# Patient Record
Sex: Male | Born: 1945
Health system: Southern US, Community
[De-identification: ages and names within clinical notes are randomized; demographics above are authoritative.]

## PROBLEM LIST (undated history)

## (undated) DIAGNOSIS — Z87442 Personal history of urinary calculi: Secondary | ICD-10-CM

## (undated) DIAGNOSIS — E785 Hyperlipidemia, unspecified: Secondary | ICD-10-CM

## (undated) DIAGNOSIS — I509 Heart failure, unspecified: Secondary | ICD-10-CM

## (undated) DIAGNOSIS — F17200 Nicotine dependence, unspecified, uncomplicated: Secondary | ICD-10-CM

## (undated) DIAGNOSIS — I5021 Acute systolic (congestive) heart failure: Secondary | ICD-10-CM

## (undated) DIAGNOSIS — I2102 ST elevation (STEMI) myocardial infarction involving left anterior descending coronary artery: Secondary | ICD-10-CM

## (undated) DIAGNOSIS — J449 Chronic obstructive pulmonary disease, unspecified: Secondary | ICD-10-CM

## (undated) DIAGNOSIS — I340 Nonrheumatic mitral (valve) insufficiency: Secondary | ICD-10-CM

## (undated) DIAGNOSIS — I2510 Atherosclerotic heart disease of native coronary artery without angina pectoris: Secondary | ICD-10-CM

## (undated) DIAGNOSIS — F1210 Cannabis abuse, uncomplicated: Secondary | ICD-10-CM

## (undated) DIAGNOSIS — I10 Essential (primary) hypertension: Secondary | ICD-10-CM

## (undated) HISTORY — PX: CARDIAC CATHETERIZATION: SHX172

## (undated) HISTORY — DX: Essential (primary) hypertension: I10

## (undated) HISTORY — DX: Heart failure, unspecified: I50.9

## (undated) HISTORY — DX: Atherosclerotic heart disease of native coronary artery without angina pectoris: I25.10

## (undated) HISTORY — DX: Hyperlipidemia, unspecified: E78.5

## (undated) HISTORY — PX: OTHER SURGICAL HISTORY: SHX169

---
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
## 1999-06-27 HISTORY — PX: TOTAL KNEE ARTHROPLASTY: SHX125

## 2001-11-25 ENCOUNTER — Emergency Department (HOSPITAL_COMMUNITY): Admission: EM | Admit: 2001-11-25 | Discharge: 2001-11-26 | Payer: Self-pay

## 2001-11-26 ENCOUNTER — Encounter: Payer: Self-pay | Admitting: Emergency Medicine

## 2001-12-01 ENCOUNTER — Emergency Department (HOSPITAL_COMMUNITY): Admission: EM | Admit: 2001-12-01 | Discharge: 2001-12-01 | Payer: Self-pay | Admitting: Emergency Medicine

## 2002-01-03 ENCOUNTER — Encounter: Payer: Self-pay | Admitting: Emergency Medicine

## 2002-01-03 ENCOUNTER — Emergency Department (HOSPITAL_COMMUNITY): Admission: EM | Admit: 2002-01-03 | Discharge: 2002-01-03 | Payer: Self-pay | Admitting: Emergency Medicine

## 2002-04-15 ENCOUNTER — Emergency Department (HOSPITAL_COMMUNITY): Admission: EM | Admit: 2002-04-15 | Discharge: 2002-04-15 | Payer: Self-pay

## 2004-05-03 ENCOUNTER — Emergency Department (HOSPITAL_COMMUNITY): Admission: EM | Admit: 2004-05-03 | Discharge: 2004-05-04 | Payer: Self-pay | Admitting: Emergency Medicine

## 2004-09-19 ENCOUNTER — Emergency Department (HOSPITAL_COMMUNITY): Admission: EM | Admit: 2004-09-19 | Discharge: 2004-09-19 | Payer: Self-pay | Admitting: Emergency Medicine

## 2005-11-02 ENCOUNTER — Emergency Department (HOSPITAL_COMMUNITY): Admission: EM | Admit: 2005-11-02 | Discharge: 2005-11-03 | Payer: Self-pay | Admitting: Emergency Medicine

## 2008-01-11 ENCOUNTER — Emergency Department (HOSPITAL_COMMUNITY): Admission: EM | Admit: 2008-01-11 | Discharge: 2008-01-11 | Payer: Self-pay | Admitting: Emergency Medicine

## 2009-10-10 ENCOUNTER — Emergency Department (HOSPITAL_COMMUNITY): Admission: EM | Admit: 2009-10-10 | Discharge: 2009-10-11 | Payer: Self-pay | Admitting: Emergency Medicine

## 2010-04-10 ENCOUNTER — Emergency Department (HOSPITAL_COMMUNITY): Admission: EM | Admit: 2010-04-10 | Discharge: 2010-04-10 | Payer: Self-pay | Admitting: Emergency Medicine

## 2013-10-23 ENCOUNTER — Encounter (HOSPITAL_COMMUNITY): Payer: Self-pay | Admitting: Emergency Medicine

## 2013-10-23 ENCOUNTER — Encounter (HOSPITAL_COMMUNITY)
Admission: EM | Disposition: A | Discharge status: Home / Self Care | Payer: Self-pay | Source: Home / Self Care | Attending: Interventional Cardiology

## 2013-10-23 ENCOUNTER — Emergency Department (HOSPITAL_COMMUNITY): Payer: Medicare Other

## 2013-10-23 ENCOUNTER — Inpatient Hospital Stay (HOSPITAL_COMMUNITY): Payer: Medicare Other

## 2013-10-23 ENCOUNTER — Inpatient Hospital Stay (HOSPITAL_COMMUNITY)
Admission: EM | Admit: 2013-10-23 | Discharge: 2013-10-25 | DRG: 287 | Disposition: A | Discharge status: Home / Self Care | Payer: Medicare Other | Attending: Interventional Cardiology | Admitting: Interventional Cardiology

## 2013-10-23 ENCOUNTER — Ambulatory Visit (HOSPITAL_COMMUNITY): Admit: 2013-10-23 | Payer: Self-pay | Admitting: Interventional Cardiology

## 2013-10-23 DIAGNOSIS — J449 Chronic obstructive pulmonary disease, unspecified: Secondary | ICD-10-CM | POA: Insufficient documentation

## 2013-10-23 DIAGNOSIS — R072 Precordial pain: Secondary | ICD-10-CM | POA: Insufficient documentation

## 2013-10-23 DIAGNOSIS — Z9119 Patient's noncompliance with other medical treatment and regimen: Secondary | ICD-10-CM

## 2013-10-23 DIAGNOSIS — I200 Unstable angina: Secondary | ICD-10-CM

## 2013-10-23 DIAGNOSIS — Z720 Tobacco use: Secondary | ICD-10-CM | POA: Diagnosis present

## 2013-10-23 DIAGNOSIS — Z91199 Patient's noncompliance with other medical treatment and regimen due to unspecified reason: Secondary | ICD-10-CM

## 2013-10-23 DIAGNOSIS — I213 ST elevation (STEMI) myocardial infarction of unspecified site: Secondary | ICD-10-CM

## 2013-10-23 DIAGNOSIS — E785 Hyperlipidemia, unspecified: Secondary | ICD-10-CM

## 2013-10-23 DIAGNOSIS — R079 Chest pain, unspecified: Secondary | ICD-10-CM | POA: Insufficient documentation

## 2013-10-23 DIAGNOSIS — I2510 Atherosclerotic heart disease of native coronary artery without angina pectoris: Secondary | ICD-10-CM | POA: Diagnosis present

## 2013-10-23 DIAGNOSIS — I249 Acute ischemic heart disease, unspecified: Secondary | ICD-10-CM | POA: Diagnosis present

## 2013-10-23 DIAGNOSIS — F17200 Nicotine dependence, unspecified, uncomplicated: Secondary | ICD-10-CM | POA: Diagnosis present

## 2013-10-23 HISTORY — PX: LEFT HEART CATHETERIZATION WITH CORONARY ANGIOGRAM: SHX5451

## 2013-10-23 LAB — BASIC METABOLIC PANEL
BUN: 16 mg/dL (ref 6–23)
CALCIUM: 9 mg/dL (ref 8.4–10.5)
CO2: 22 meq/L (ref 19–32)
CREATININE: 1.07 mg/dL (ref 0.50–1.35)
Chloride: 109 mEq/L (ref 96–112)
GFR, EST AFRICAN AMERICAN: 80 mL/min — AB (ref >90)
GFR, EST NON AFRICAN AMERICAN: 69 mL/min — AB (ref >90)
GLUCOSE: 104 mg/dL — AB (ref 70–99)
POTASSIUM: 3.9 meq/L (ref 3.7–5.3)
SODIUM: 146 meq/L (ref 137–147)

## 2013-10-23 LAB — CBC
HEMATOCRIT: 43 % (ref 39.0–52.0)
Hemoglobin: 14.7 g/dL (ref 13.0–17.0)
MCH: 31.1 pg (ref 26.0–34.0)
MCHC: 34.2 g/dL (ref 30.0–36.0)
MCV: 91.1 fL (ref 78.0–100.0)
Platelets: 240 10*3/uL (ref 150–400)
RBC: 4.72 MIL/uL (ref 4.22–5.81)
RDW: 15.3 % (ref 11.5–15.5)
WBC: 17.1 10*3/uL — ABNORMAL HIGH (ref 4.0–10.5)

## 2013-10-23 LAB — I-STAT TROPONIN, ED: TROPONIN I, POC: 0 ng/mL (ref 0.00–0.08)

## 2013-10-23 LAB — PRO B NATRIURETIC PEPTIDE: Pro B Natriuretic peptide (BNP): 344.6 pg/mL — ABNORMAL HIGH (ref 0–125)

## 2013-10-23 LAB — MRSA PCR SCREENING: MRSA by PCR: NEGATIVE

## 2013-10-23 LAB — TROPONIN I

## 2013-10-23 SURGERY — LEFT HEART CATHETERIZATION WITH CORONARY ANGIOGRAM
Anesthesia: Choice | Laterality: Bilateral

## 2013-10-23 MED ORDER — HEPARIN SODIUM (PORCINE) 5000 UNIT/ML IJ SOLN
INTRAMUSCULAR | Status: AC
  Administered 2013-10-23: 4000 [IU]
  Filled 2013-10-23: qty 1

## 2013-10-23 MED ORDER — LIDOCAINE HCL (PF) 1 % IJ SOLN
INTRAMUSCULAR | Status: AC
  Filled 2013-10-23: qty 30

## 2013-10-23 MED ORDER — NITROGLYCERIN IN D5W 200-5 MCG/ML-% IV SOLN
5.0000 ug/min | INTRAVENOUS | Status: DC
  Administered 2013-10-23: 5 ug/min via INTRAVENOUS

## 2013-10-23 MED ORDER — IOHEXOL 350 MG/ML SOLN
100.0000 mL | Freq: Once | INTRAVENOUS | Status: AC | PRN
  Administered 2013-10-23: 100 mL via INTRAVENOUS

## 2013-10-23 MED ORDER — DIPHENHYDRAMINE HCL 25 MG PO CAPS
25.0000 mg | ORAL_CAPSULE | ORAL | Status: DC | PRN
  Administered 2013-10-23 – 2013-10-24 (×3): 25 mg via ORAL
  Filled 2013-10-23 (×3): qty 1

## 2013-10-23 MED ORDER — ASPIRIN EC 325 MG PO TBEC
325.0000 mg | DELAYED_RELEASE_TABLET | Freq: Every day | ORAL | Status: DC
  Administered 2013-10-24 – 2013-10-25 (×2): 325 mg via ORAL
  Filled 2013-10-23 (×3): qty 1

## 2013-10-23 MED ORDER — VERAPAMIL HCL 2.5 MG/ML IV SOLN
INTRAVENOUS | Status: AC
  Filled 2013-10-23: qty 2

## 2013-10-23 MED ORDER — NITROGLYCERIN 0.2 MG/ML ON CALL CATH LAB
INTRAVENOUS | Status: AC
  Filled 2013-10-23: qty 1

## 2013-10-23 MED ORDER — HEPARIN (PORCINE) IN NACL 100-0.45 UNIT/ML-% IJ SOLN
750.0000 [IU]/h | INTRAMUSCULAR | Status: DC
  Filled 2013-10-23: qty 250

## 2013-10-23 MED ORDER — HEPARIN (PORCINE) IN NACL 2-0.9 UNIT/ML-% IJ SOLN
INTRAMUSCULAR | Status: AC
Start: 2013-10-23 — End: 2013-10-23
  Filled 2013-10-23: qty 1000

## 2013-10-23 MED ORDER — OXYCODONE-ACETAMINOPHEN 5-325 MG PO TABS
1.0000 | ORAL_TABLET | ORAL | Status: DC | PRN
  Administered 2013-10-23 – 2013-10-24 (×3): 2 via ORAL
  Filled 2013-10-23 (×3): qty 2

## 2013-10-23 MED ORDER — HEPARIN SODIUM (PORCINE) 1000 UNIT/ML IJ SOLN
INTRAMUSCULAR | Status: AC
  Filled 2013-10-23: qty 1

## 2013-10-23 MED ORDER — MORPHINE SULFATE 2 MG/ML IJ SOLN
2.0000 mg | Freq: Once | INTRAMUSCULAR | Status: AC
  Administered 2013-10-23: 2 mg via INTRAVENOUS
  Filled 2013-10-23: qty 1

## 2013-10-23 MED ORDER — FENTANYL CITRATE 0.05 MG/ML IJ SOLN
INTRAMUSCULAR | Status: AC
  Filled 2013-10-23: qty 2

## 2013-10-23 MED ORDER — MIDAZOLAM HCL 2 MG/2ML IJ SOLN
INTRAMUSCULAR | Status: AC
  Filled 2013-10-23: qty 2

## 2013-10-23 MED ORDER — NITROGLYCERIN IN D5W 200-5 MCG/ML-% IV SOLN
INTRAVENOUS | Status: AC
  Filled 2013-10-23: qty 250

## 2013-10-23 MED ORDER — DIPHENHYDRAMINE HCL 50 MG/ML IJ SOLN
INTRAMUSCULAR | Status: AC
  Filled 2013-10-23: qty 1

## 2013-10-23 MED ORDER — SODIUM CHLORIDE 0.9 % IV SOLN
INTRAVENOUS | Status: AC
  Administered 2013-10-23: 100 mL/h via INTRAVENOUS

## 2013-10-23 MED ORDER — HEPARIN BOLUS VIA INFUSION: 4000.0000 [IU] | Freq: Once | INTRAVENOUS | Status: DC

## 2013-10-23 NOTE — ED Notes (Signed)
Reported patient given another 324 Aspirin and 2 sl nitro per EMS.    Pt is short of breath while sitting in stretcher.

## 2013-10-23 NOTE — Progress Notes (Signed)
ANTICOAGULATION CONSULT NOTE - Initial Consult  Pharmacy Consult for heparin  Indication: chest pain/ACS  Allergies  Allergen Reactions  . Flexeril [Cyclobenzaprine]     Patient Measurements: Height: 5\' 10"  (177.8 cm) Weight: 145 lb (65.772 kg) IBW/kg (Calculated) : 73 Heparin Dosing Weight: n/a  Vital Signs: Temp: 98.3 F (36.8 C) (04/30 1847) Temp src: Oral (04/30 1847) BP: 126/81 mmHg (04/30 1900) Pulse Rate: 86 (04/30 1900)  Labs:  Recent Labs  10/23/13 1847  HGB 14.7  HCT 43.0  PLT 240  CREATININE 1.07  TROPONINI <0.30    Estimated Creatinine Clearance: 60.6 ml/min (by C-G formula based on Cr of 1.07).   Medical History: History reviewed. No pertinent past medical history.  Medications:   (Not in a hospital admission)  Assessment: 32 YOM who presents to the ED with CP that started at 5:30 PM. EKG concerning for STEMI. Initial troponin is negative. Pharmacy to start Pt already receiving a heparin 4000 unit bolus in ED. Pharmacy consulted to start a heparin drip in patient. Hgb and Plt wnl. Pt is not on anticoagulation pta.   Goal of Therapy:  Heparin level 0.3-0.7 units/ml Monitor platelets by anticoagulation protocol: Yes   Plan:  Start heparin infusion at 750 units/hr Check anti-Xa level in 6 hours and daily while on heparin Continue to monitor H&H and platelets  Vinnie Level, PharmD.  Clinical Pharmacist Pager [phone]

## 2013-10-23 NOTE — ED Provider Notes (Signed)
CSN: [id number]     Arrival date & time 10/23/13  2863 History   None    Chief Complaint  Patient presents with  . Chest Pain     (Consider location/radiation/quality/duration/timing/severity/associated sxs/prior Treatment) The history is provided by the patient.    Simone Burrington is a 68 y.o. male who presents for evaluation of chest pain that started at 5:30 PM. He also had an episode of chest pain early this morning that resolved in a few minutes after taking an aspirin. The chest pain has a burning feeling. The chest pain was 10 out of 10 at worse, and is now 9/10. He was treated by EMS with 324mg   aspirin, and sublingual nitroglycerin x2, with partial relief. He's never had this previously. The discomfort, started after he was loading a heavy piece of equipment, onto his truck, by himself. 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 He has a cough that has been nonproductive. He denies fever, chills, nausea, vomiting, weakness, or dizziness. He, states that he takes aspirin regularly. Has not taken her prescribed medications. He smokes only cigars. There are no other known modifying factors.  History reviewed. No pertinent past medical history. Past Surgical History  Procedure Laterality Date  . Snake bite surgery     History reviewed. No pertinent family history. History  Substance Use Topics  . Smoking status: Current Some Day Smoker  . Smokeless tobacco: Not on file  . Alcohol Use: No    Review of Systems  All other systems reviewed and are negative.     Allergies  Flexeril  Home Medications   Prior to Admission medications   Not on File   BP 156/86  Pulse 89  Temp(Src) 98 F (36.7 C) (Oral)  Resp 24  Ht 5\' 10"  (1.778 m)  Wt 129 lb 3 oz (58.6 kg)  BMI 18.54 kg/m2  SpO2 99% Physical Exam  Nursing note and vitals reviewed. Constitutional: He is oriented to person, place, and time. He appears well-developed.  Appears older than stated age   HENT:  Head: Normocephalic and atraumatic.  Right Ear: External ear normal.  Left Ear: External ear normal.  Eyes: Conjunctivae and EOM are normal. Pupils are equal, round, and reactive to light.  Neck: Normal range of motion and phonation normal. Neck supple.  Cardiovascular: Normal rate, regular rhythm, normal heart sounds and intact distal pulses.   Pulmonary/Chest: Effort normal and breath sounds normal. He exhibits tenderness (Mild diffuse, without deformity or crepitation). He exhibits no bony tenderness.  Abdominal: Soft. There is no tenderness.  Musculoskeletal: Normal range of motion.  Neurological: He is alert and oriented to person, place, and time. No cranial nerve deficit or sensory deficit. He exhibits normal muscle tone. Coordination normal.  Skin: Skin is warm, dry and intact.  Psychiatric: He has a normal mood and affect. His behavior is normal. Judgment and thought content normal.    ED Course  Procedures (including critical care time)  Medications  0.9 %  sodium chloride infusion (100 mL/hr Intravenous New Bag/Given 10/23/13 2105)  oxyCODONE-acetaminophen (PERCOCET/ROXICET) 5-325 MG per tablet 1-2 tablet (2 tablets Oral Given 10/23/13 2320)  aspirin EC tablet 325 mg (325 mg Oral Not Given 10/23/13 2115)  diphenhydrAMINE (BENADRYL) capsule 25 mg (25 mg Oral Given 10/23/13 2321)  heparin ADULT infusion 100 units/mL (25000 units/250 mL) (not administered)  heparin 5000 UNIT/ML injection (4,000 Units  Given 10/23/13 1907)  morphine 2 MG/ML injection 2 mg (2 mg Intravenous  Given 10/23/13 1951)  lidocaine (PF) (XYLOCAINE) 1 % injection (not administered)  heparin 2-0.9 UNIT/ML-% infusion (not administered)  nitroGLYCERIN (NTG ON-CALL) 0.2 mg/mL injection (not administered)  verapamil (ISOPTIN) 2.5 MG/ML injection (not administered)  midazolam (VERSED) 2 MG/2ML injection (not administered)  fentaNYL (SUBLIMAZE) 0.05 MG/ML injection (not administered)  heparin 1000 UNIT/ML  injection (not administered)  iohexol (OMNIPAQUE) 350 MG/ML injection 100 mL (100 mLs Intravenous Contrast Given 10/23/13 2231)    Patient Vitals for the past 24 hrs:  BP Temp Temp src Pulse Resp SpO2 Height Weight  10/23/13 2130 156/86 mmHg - - 89 24 99 % - -  10/23/13 2115 114/73 mmHg 98 F (36.7 C) Oral - 20 99 % 5\' 10"  (1.778 m) 129 lb 3 oz (58.6 kg)  10/23/13 2018 - - - 87 - - - -  10/23/13 1913 - - - - - - 5\' 10"  (1.778 m) 145 lb (65.772 kg)  10/23/13 1900 126/81 mmHg - - 86 - 96 % - -  10/23/13 1847 120/83 mmHg 98.3 F (36.8 C) Oral 86 - 95 % - -  10/23/13 1845 120/83 mmHg - - 84 - 97 % - -    7:02 PM Reevaluation with update and discussion. After initial assessment and treatment, an updated evaluation reveals a complaint of increasing chest pain. Repeat EKG is consistent with evolving STEMI, inferior. I have discussed case with Dr, Katrinka BlazingSmith who is in the ED seeing the patient now. He took the patient to the Cath lab. Flint MelterElliott L Raven Furnas    CRITICAL CARE Performed by: Flint MelterElliott L Ron Junco Total critical care time: 45 minutes Critical care time was exclusive of separately billable procedures and treating other patients. Critical care was necessary to treat or prevent imminent or life-threatening deterioration. Critical care was time spent personally by me on the following activities: development of treatment plan with patient and/or surrogate as well as nursing, discussions with consultants, evaluation of patient's response to treatment, examination of patient, obtaining history from patient or surrogate, ordering and performing treatments and interventions, ordering and review of laboratory studies, ordering and review of radiographic studies, pulse oximetry and re-evaluation of patient's condition.  Labs Review Labs Reviewed  CBC - Abnormal; Notable for the following:    WBC 17.1 (*)    All other components within normal limits  BASIC METABOLIC PANEL - Abnormal; Notable for the following:     Glucose, Bld 104 (*)    GFR calc non Af Amer 69 (*)    GFR calc Af Amer 80 (*)    All other components within normal limits  PRO B NATRIURETIC PEPTIDE - Abnormal; Notable for the following:    Pro B Natriuretic peptide (BNP) 344.6 (*)    All other components within normal limits  MRSA PCR SCREENING  TROPONIN I  LIPID PANEL  TSH  HEMOGLOBIN A1C  CBC  HEPARIN LEVEL (UNFRACTIONATED)  Rosezena SensorI-STAT TROPOININ, ED    Imaging Review Dg Chest Port 1 View  10/23/2013   CLINICAL DATA:  Chest pain shortness of breath.  EXAM: PORTABLE CHEST - 1 VIEW  COMPARISON:  Chest radiograph 11/02/2005.  FINDINGS: Heart size is normal. There is a prominent ascending thoracic aorta contour that appears more prominent compared to chest radiograph dated 11/02/2005. Ascending thoracic aortic aneurysm or aortic dissection cannot be excluded. Lung volumes are low. No focal airspace disease. Negative for pleural effusion or pneumothorax. Osteopenia. No acute bony abnormality identified.  IMPRESSION: Prominent ascending thoracic aorta contour, for which aneurysm or dissection  cannot be excluded. Appears more prominent when compared to prior chest radiographs.   Electronically Signed   By: Britta Mccreedy M.D.   On: 10/23/2013 19:59       EKG Interpretation  Date/Time:  Thursday October 23 2013 19:32:17 EDT Ventricular Rate:  87 PR Interval:  177 QRS Duration: 113 QT Interval:  395 QTC Calculation: 475 R Axis:   28 Text Interpretation:  Sinus rhythm Inferior infarct, acute (RCA) Probable RV involvement, suggest recording right precordial leads Evolution of inferior abnormality c/w STEMI Reconfirmed by Ziara Thelander  MD, Teonia Yager ([id number]) on 10/23/2013 7:43:20 PM        MDM   Final diagnoses:  STEMI (ST elevation myocardial infarction)    Chest Pain with EKG concerning for STEMI. Initial Troponin is negative.   Nursing Notes Reviewed/ Care Coordinated, and agree without changes. Applicable Imaging Reviewed.   Interpretation of Laboratory Data incorporated into ED treatment  Plan: Admit    Flint Melter, MD 10/24/13 [phone]

## 2013-10-23 NOTE — ED Notes (Signed)
STEMI called @ 19:37

## 2013-10-23 NOTE — ED Notes (Signed)
Placed on 2L [place]  

## 2013-10-23 NOTE — ED Notes (Signed)
Family at bedside. 

## 2013-10-23 NOTE — ED Notes (Signed)
Chest pain this a.m.  Patient took 325mg  Asprin , pain went away, then returned at 1730, so EMS was called

## 2013-10-23 NOTE — H&P (Addendum)
Cardiology History and Physical  No PCP Per Patient  History of Present Illness (and review of medical records): Ray Smith is a 68 y.o. male who presents for evaluation of chest pain.  He has no prior known hx of CAD or MI.  He smokes cigars.  No other current CVD risk factors.  He takes ASA daily.  He initally developed chest pain this am around 7am.  Pain resolved spontaneously and he went on to work.  His pain came back severe around 10/10 this evening.  He denied any prior hx of similar symptoms.  EMS was called and he was treated with ASA, NTG.  Upon initial evaluation he had ekg with changes concerning for ACS.  Code STEMI was called and patient was transferred to cardiac catheterization lab for further evaluation and management.   Review of Systems Further review of systems was otherwise negative other than stated in HPI.  There are no active problems to display for this patient.  History reviewed. No pertinent past medical history.  Past Surgical History  Procedure Laterality Date  . Snake bite surgery      No current facility-administered medications on file prior to encounter.   No current outpatient prescriptions on file prior to encounter.    Allergies  Allergen Reactions  . Flexeril [Cyclobenzaprine]     History  Substance Use Topics  . Smoking status: Current Some Day Smoker  . Smokeless tobacco: Not on file  . Alcohol Use: No    History reviewed. No pertinent family history.   Objective:  Patient Vitals for the past 8 hrs:  BP Temp Temp src Pulse SpO2 Height Weight  10/23/13 1913 - - - - - 5' 10"  (1.778 m) 65.772 kg (145 lb)  10/23/13 1900 126/81 mmHg - - 86 96 % - -  10/23/13 1847 120/83 mmHg 98.3 F (36.8 C) Oral 86 95 % - -  10/23/13 1845 120/83 mmHg - - 84 97 % - -   General appearance: alert, cooperative, appears stated age and mild distress Head: Normocephalic, without obvious abnormality, atraumatic Eyes: conjunctivae/corneas clear. PERRL, EOM's  intact. Fundi benign. Neck: no carotid bruit, no JVD and supple, symmetrical, trachea midline Lungs: clear to auscultation bilaterally Chest wall: no tenderness Heart: regular rate and rhythm, S1, S2 normal, no murmur, click, rub or gallop Abdomen: soft, non-tender; bowel sounds normal; no masses,  no organomegaly Extremities: extremities normal, atraumatic, no cyanosis or edema Pulses: 2+ and symmetric Neurologic: Grossly normal  Results for orders placed during the hospital encounter of 10/23/13 (from the past 48 hour(s))  CBC     Status: Abnormal   Collection Time    10/23/13  6:47 PM      Result Value Ref Range   WBC 17.1 (*) 4.0 - 10.5 K/uL   RBC 4.72  4.22 - 5.81 MIL/uL   Hemoglobin 14.7  13.0 - 17.0 g/dL   HCT 43.0  39.0 - 52.0 %   MCV 91.1  78.0 - 100.0 fL   MCH 31.1  26.0 - 34.0 pg   MCHC 34.2  30.0 - 36.0 g/dL   RDW 15.3  11.5 - 15.5 %   Platelets 240  150 - 400 K/uL  BASIC METABOLIC PANEL     Status: Abnormal   Collection Time    10/23/13  6:47 PM      Result Value Ref Range   Sodium 146  137 - 147 mEq/L   Potassium 3.9  3.7 - 5.3 mEq/L  Chloride 109  96 - 112 mEq/L   CO2 22  19 - 32 mEq/L   Glucose, Bld 104 (*) 70 - 99 mg/dL   BUN 16  6 - 23 mg/dL   Creatinine, Ser 1.97  0.50 - 1.35 mg/dL   Calcium 9.0  8.4 - 58.8 mg/dL   GFR calc non Af Amer 69 (*) >90 mL/min   GFR calc Af Amer 80 (*) >90 mL/min   Comment: (NOTE)     The eGFR has been calculated using the CKD EPI equation.     This calculation has not been validated in all clinical situations.     eGFR's persistently <90 mL/min signify possible Chronic Kidney     Disease.  PRO B NATRIURETIC PEPTIDE     Status: Abnormal   Collection Time    10/23/13  6:47 PM      Result Value Ref Range   Pro B Natriuretic peptide (BNP) 344.6 (*) 0 - 125 pg/mL  TROPONIN I     Status: None   Collection Time    10/23/13  6:47 PM      Result Value Ref Range   Troponin I <0.30  <0.30 ng/mL   Comment:            Due to the  release kinetics of cTnI,     a negative result within the first hours     of the onset of symptoms does not rule out     myocardial infarction with certainty.     If myocardial infarction is still suspected,     repeat the test at appropriate intervals.  Rosezena Sensor, ED     Status: None   Collection Time    10/23/13  6:58 PM      Result Value Ref Range   Troponin i, poc 0.00  0.00 - 0.08 ng/mL   Comment 3            Comment: Due to the release kinetics of cTnI,     a negative result within the first hours     of the onset of symptoms does not rule out     myocardial infarction with certainty.     If myocardial infarction is still suspected,     repeat the test at appropriate intervals.   Dg Chest Port 1 View  10/23/2013   CLINICAL DATA:  Chest pain shortness of breath.  EXAM: PORTABLE CHEST - 1 VIEW  COMPARISON:  Chest radiograph 11/02/2005.  FINDINGS: Heart size is normal. There is a prominent ascending thoracic aorta contour that appears more prominent compared to chest radiograph dated 11/02/2005. Ascending thoracic aortic aneurysm or aortic dissection cannot be excluded. Lung volumes are low. No focal airspace disease. Negative for pleural effusion or pneumothorax. Osteopenia. No acute bony abnormality identified.  IMPRESSION: Prominent ascending thoracic aorta contour, for which aneurysm or dissection cannot be excluded. Appears more prominent when compared to prior chest radiographs.   Electronically Signed   By: Britta Mccreedy M.D.   On: 10/23/2013 19:59    ECG:  Sinus rhythm HR 87, diffuse ST elevation, ~28mm, consider injury vs early replorization, no prior ecg to compare.  Assessment: Patient Active Problem List   Diagnosis Date Noted  . ACS (acute coronary syndrome) 10/23/2013    Plan: 1. Emergent LHC possible PCI 2. No contraindications for DAPT 3. Post procedure monitoring in ICU. 4. Repeat ekg on admit, prn chest pain or arrythmia 5. Cycle cardiac biomarkers,  check lipids, hgba1c,  tsh 6. Medical management to include ASA,BB, NTG prn  Addendum: Cath results: IMPRESSIONS: 1. Significant distal left main, ostial LAD, and moderate ostial circumflex. Though this disease is significant, the patient's chest pain is not compatible with the anatomy.  2. Normal left ventricular function, with LVEF 60% without regional wall motion abnormality  3. Chest pain with pleuritic component and also discomfort in the back.   RECOMMENDATION: Beta blocker therapy.  IV heparin.  CT scan of the chest with contrast to exclude aortic dissection and pulmonary embolism.

## 2013-10-23 NOTE — Progress Notes (Signed)
ANTICOAGULATION CONSULT NOTE - Follow Up Consult  Pharmacy Consult for Heparin Indication: ACS/STEMI  Allergies  Allergen Reactions  . Flexeril [Cyclobenzaprine]     Patient Measurements: Height: 5\' 10"  (177.8 cm) Weight: 129 lb 3 oz (58.6 kg) IBW/kg (Calculated) : 73 Heparin Dosing Weight: 58.6 kg  Vital Signs: Temp: 98 F (36.7 C) (04/30 2115) Temp src: Oral (04/30 2115) BP: 156/86 mmHg (04/30 2130) Pulse Rate: 89 (04/30 2130)  Labs:  Recent Labs  10/23/13 1847  HGB 14.7  HCT 43.0  PLT 240  CREATININE 1.07  TROPONINI <0.30    Estimated Creatinine Clearance: 54 ml/min (by C-G formula based on Cr of 1.07).  Assessment:  s/p urgent cardiac cath tonight. Noted chest pain is not compatible with anatomy.  Heparin to resume 8 hrs after sheath out.  Just returned from CT; ruling out aortic dissection and PE.  TR band deflation about to begin.    Goal of Therapy:  Heparin level 0.3-0.7 units/ml Monitor platelets by anticoagulation protocol: Yes   Plan:   Resume heparin ~8:30am at 750 units/hr.  Heparin level ~ 6 hrs after drip resumes => 2:30pm.  Daily heparin level and CBC while on heparin.  Follow up CT.  Dennie Fetters, [place] Pager: [phone] 10/23/2013,11:21 PM

## 2013-10-23 NOTE — CV Procedure (Addendum)
     Left Heart Catheterization with Coronary Angiography  Report  Ray Smith  68 y.o.  male [birth year]  Procedure Date: 10/23/2013 Referring Physician: [facility] Primary Cardiologist: Cherrie Gauze Leia Alf, M.D.  INDICATIONS: Prolonged chest pain with STEMI being called by the emergency department  PROCEDURE: 1. Left heart catheterization; 2. Coronary angiography; 3. Left ventriculography  CONSENT:  The risks, benefits, and details of the procedure were explained in detail to the patient. Risks including death, stroke, heart attack, kidney injury, allergy, limb ischemia, bleeding and radiation injury were discussed.  The patient verbalized understanding and wanted to proceed.  Informed written consent was obtained.  PROCEDURE TECHNIQUE:  After Xylocaine anesthesia a 5 French Slender sheath was placed in the right radial artery with an angiocath and the modified Seldinger technique.  Coronary angiography was done using a 5 F JL 3.5 and JR 4 catheter.  Left ventriculography was done using the JR 4 catheter and hand injection.   After reviewing angiography, we did not feel the the anatomy suggested an acute STEMI. He does have significant distal left main and ostial LAD disease. His chest pain is ongoing, pleuritic, and made worse by changes in position.   CONTRAST:  Total of 90 cc.  COMPLICATIONS:  None   HEMODYNAMICS:  Aortic pressure 112/70 mmHg; LV pressure 116/5; LVEDP 17 mm mercury  ANGIOGRAPHIC DATA:   The left main coronary artery is 40-60% distal.  The left anterior descending artery is patent but with ostial 80%, and 50% mid stenosis. LAD is transapical..  The left circumflex artery is patent but with ostial 50-70%, otherwise clean.  The right coronary artery is 50% mid vessel stenosis.   LEFT VENTRICULOGRAM:  Left ventricular angiogram was done in the 30 RAO projection and revealed EF of 60%. No regional wall motion abnormality   IMPRESSIONS:  1. Significant distal  left main/ostial LAD, and moderate ostial circumflex. Though this disease is significant, the patient's chest pain/clinical course is not consistent with the anatomy/findings.  2. Normal left ventricular function, with LVEF 60% without regional wall motion abnormality  3. Chest pain with pleuritic component and also discomfort in the back.   RECOMMENDATION:  Beta blocker therapy.  IV heparin.  CT scan of the chest with contrast to exclude aortic dissection and pulmonary embolism as alternative explanations for symptoms.  Med therapy vs PCI vs CABG for CAD once clinical presentation is understood.  I have not consulted surgery.

## 2013-10-24 DIAGNOSIS — R079 Chest pain, unspecified: Secondary | ICD-10-CM

## 2013-10-24 DIAGNOSIS — E785 Hyperlipidemia, unspecified: Secondary | ICD-10-CM | POA: Diagnosis present

## 2013-10-24 LAB — TSH: TSH: 1.15 u[IU]/mL (ref 0.350–4.500)

## 2013-10-24 LAB — CBC
HCT: 37.8 % — ABNORMAL LOW (ref 39.0–52.0)
Hemoglobin: 12.4 g/dL — ABNORMAL LOW (ref 13.0–17.0)
MCH: 30.2 pg (ref 26.0–34.0)
MCHC: 32.8 g/dL (ref 30.0–36.0)
MCV: 92 fL (ref 78.0–100.0)
Platelets: 200 10*3/uL (ref 150–400)
RBC: 4.11 MIL/uL — AB (ref 4.22–5.81)
RDW: 15.6 % — ABNORMAL HIGH (ref 11.5–15.5)
WBC: 15.5 10*3/uL — AB (ref 4.0–10.5)

## 2013-10-24 LAB — LIPID PANEL
CHOLESTEROL: 172 mg/dL (ref 0–200)
HDL: 43 mg/dL (ref >39)
LDL Cholesterol: 117 mg/dL — ABNORMAL HIGH (ref 0–99)
Total CHOL/HDL Ratio: 4 RATIO
Triglycerides: 59 mg/dL (ref <150)
VLDL: 12 mg/dL (ref 0–40)

## 2013-10-24 LAB — TROPONIN I

## 2013-10-24 LAB — HEMOGLOBIN A1C
HEMOGLOBIN A1C: 5.4 % (ref <5.7)
MEAN PLASMA GLUCOSE: 108 mg/dL (ref <117)

## 2013-10-24 MED ORDER — NITROGLYCERIN 0.4 MG SL SUBL: SUBLINGUAL_TABLET | SUBLINGUAL | Status: DC

## 2013-10-24 MED ORDER — OXYCODONE-ACETAMINOPHEN 5-325 MG PO TABS: 1.0000 | ORAL_TABLET | Freq: Four times a day (QID) | ORAL | Status: DC | PRN

## 2013-10-24 MED ORDER — AMLODIPINE BESYLATE 5 MG PO TABS
5.0000 mg | ORAL_TABLET | Freq: Every day | ORAL | Status: DC
  Administered 2013-10-24 – 2013-10-25 (×2): 5 mg via ORAL
  Filled 2013-10-24 (×2): qty 1

## 2013-10-24 MED ORDER — SODIUM CHLORIDE 0.9 % IV SOLN
INTRAVENOUS | Status: DC
Start: 2013-10-24 — End: 2013-10-25

## 2013-10-24 MED ORDER — NITROGLYCERIN 0.4 MG SL SUBL
SUBLINGUAL_TABLET | SUBLINGUAL | Status: AC
  Filled 2013-10-24: qty 1

## 2013-10-24 MED ORDER — CLONIDINE HCL 0.1 MG PO TABS
0.1000 mg | ORAL_TABLET | Freq: Once | ORAL | Status: AC
  Administered 2013-10-24: 0.1 mg via ORAL
  Filled 2013-10-24: qty 1

## 2013-10-24 MED ORDER — METOPROLOL TARTRATE 12.5 MG HALF TABLET
12.5000 mg | ORAL_TABLET | Freq: Two times a day (BID) | ORAL | Status: DC
  Administered 2013-10-24 – 2013-10-25 (×3): 12.5 mg via ORAL
  Filled 2013-10-24 (×4): qty 1

## 2013-10-24 MED ORDER — ATORVASTATIN CALCIUM 20 MG PO TABS
20.0000 mg | ORAL_TABLET | Freq: Every day | ORAL | Status: DC
  Administered 2013-10-24: 20 mg via ORAL
  Filled 2013-10-24 (×2): qty 1

## 2013-10-24 MED ORDER — NITROGLYCERIN 0.4 MG SL SUBL
0.4000 mg | SUBLINGUAL_TABLET | SUBLINGUAL | Status: DC | PRN
  Administered 2013-10-24: 0.4 mg via SUBLINGUAL

## 2013-10-24 MED ORDER — METOPROLOL TARTRATE 12.5 MG HALF TABLET: 12.5000 mg | ORAL_TABLET | Freq: Two times a day (BID) | ORAL | Status: DC

## 2013-10-24 MED ORDER — ASPIRIN 325 MG PO TBEC: 325.0000 mg | DELAYED_RELEASE_TABLET | Freq: Every day | ORAL | Status: DC

## 2013-10-24 MED ORDER — ATORVASTATIN CALCIUM 20 MG PO TABS: 20.0000 mg | ORAL_TABLET | Freq: Every day | ORAL | Status: DC

## 2013-10-24 NOTE — Progress Notes (Signed)
Called by RN pt now pain free but B/P 196/116. I suggested pt stay and have B/P treated. I added Clonidine 01 mg X 1 and started Norvasc 5 mg daily.  Corine Shelter PA-C 10/24/2013 3:12 PM

## 2013-10-24 NOTE — Progress Notes (Signed)
    Subjective:  No chest pain. Feels better. Thinks he just 'overdid it' yesterday with a lot of lifting.   Objective:  Vital Signs in the last 24 hours: Temp:  [98 F (36.7 C)-98.4 F (36.9 C)] 98.4 F (36.9 C) (05/01 0700) Pulse Rate:  [72-89] 73 (05/01 0900) Resp:  [17-24] 23 (05/01 0900) BP: (106-156)/(68-86) 136/80 mmHg (05/01 0900) SpO2:  [95 %-99 %] 96 % (05/01 0900) Weight:  [58.6 kg (129 lb 3 oz)-65.772 kg (145 lb)] 58.6 kg (129 lb 3 oz) (04/30 2115)  Intake/Output from previous day: 04/30 0701 - 05/01 0700 In: 990 [I.V.:990] Out: 1250 [Urine:1250]  Physical Exam: Pt is alert and oriented, NAD HEENT: normal Neck: JVP - normal Lungs: CTA bilaterally but distant lung sounds CV: RRR without murmur or gallop Abd: soft, NT, Positive BS, no hepatomegaly Ext: no C/C/E, distal pulses intact and equal, right radial site clear Skin: warm/dry no rash   Lab Results:  Recent Labs  10/23/13 1847 10/24/13 0500  WBC 17.1* 15.5*  HGB 14.7 12.4*  PLT 240 200    Recent Labs  10/23/13 1847  NA 146  K 3.9  CL 109  CO2 22  GLUCOSE 104*  BUN 16  CREATININE 1.07    Recent Labs  10/23/13 1847  TROPONINI <0.30    Cardiac Studies: Cath and Chest CTA, Labs reviewed  Tele: Sinus rhythm no arrhythmia  Assessment/Plan:  1. Chest pain unclear etiology, possibly musculoskeletal but doubt ischemic in origin 2. CAD, obstructive with involvement of the left main and proximal LAD 3. Heavy tobacco 4. Medical noncompliance  Data reviewed. Cath images reviewed. Pt has significant left main and LAD stenosis in my opinion, but I think his resting chest pain is unlikely to be cardiac in origin. His pain is completely resolved despite no intervention and CAD is not severe enough to cause resting angina. I reviewed potential treatment which include medical therapy versus CABG. He refuses consideration of CABG - states 'I'm ready to go when it's my time.' He also refuses to take  any 'blood-thinning' medications. He has refused heparin here in the hospital. He agrees to take a daily aspirin. Also agrees to take metoprolol and atorvastatin but I doubt he will continue these medications long-term. I advised that he refrain from strenuous activities. He understands to call if he develops symptoms of angina. Tobacco cessation counseling done and he states 'I have smoked my last cigar.'  Plan: add troponin to am lab. If negative, will d/c home this am. Follow-up with physician extender within 2 weeks.  Tonny Bollman, M.D. 10/24/2013, 10:02 AM

## 2013-10-24 NOTE — Progress Notes (Signed)
Upon discharge teaching; pt's BP in the high 190s/100s; MD paged and made aware; canceled discharge; ordered received

## 2013-10-24 NOTE — Discharge Summary (Addendum)
Patient ID: Ray Smith,  MRN: [id number], DOB/AGE: 1945-07-08 68 y.o.  Admit date: 10/23/2013 Discharge date: 10/25/2013  Primary Care Provider: None Primary Cardiologist: Dr Tamala Julian  Discharge Diagnoses Principal Problem:   ACS (acute coronary syndrome) Active Problems:   CAD- moderate LAD and LM disease, not felt to be cause of pt's symptoms 10/23/13   Tobacco abuse   Dyslipidemia   Procedures: Urgent cath 10/23/13, cardiac catheterization, coronary arteriogram, left ventriculogram, CTA chest - dissection protocol   Hospital Course: 68 y/o male with no history of CAD or MI, presented 10/24/23 with chest pain c/w [place]. His admission EKG suggested inferior ST elevation and code STEMI was called and the pt was taken to the cath lab. Cath revealed 40-60% distal LM, 80% ostial LAD and 50% mid LAD. CFX had a 50-70% stenosis, and RCA a 50% stenosis. EF was 60%.   His pain resolved spontaneously. Troponin and POC were negative. Cath images were reviewed by Dr Burt Knack. Pt has significant left main and LAD stenosis but he thought his resting chest pain was unlikely to be cardiac in origin. His pain had completely resolved despite no intervention and CAD was not severe enough to cause resting angina.   He reviewed potential treatment options which included medical therapy versus CABG. He refused consideration of CABG - states 'I'm ready to go when it's my time.' He also refused to take any 'blood-thinning' medications. He has refused heparin while here in the hospital. He agrees to take a daily aspirin. He also agrees to take metoprolol and atorvastatin but we are concerned he won't continue these medications long-term. We advised that he refrain from strenuous activities.   He understands to call if he develops symptoms of angina. Tobacco cessation counseling was done and he states 'I have smoked my last cigar. He'll follow up in two weeks in the office with an APP.   Discharge Vitals:  Blood  pressure 122/85, pulse 82, temperature 97.4 F (36.3 C), temperature source Oral, resp. rate 20, height 5' 10"  (1.778 m), weight 128 lb 12.8 oz (58.423 kg), SpO2 98.00%.    Radiology:  Dg Chest Port 1 View 10/23/2013   CLINICAL DATA:  Chest pain shortness of breath.  EXAM: PORTABLE CHEST - 1 VIEW  COMPARISON:  Chest radiograph 11/02/2005.  FINDINGS: Heart size is normal. There is a prominent ascending thoracic aorta contour that appears more prominent compared to chest radiograph dated 11/02/2005. Ascending thoracic aortic aneurysm or aortic dissection cannot be excluded. Lung volumes are low. No focal airspace disease. Negative for pleural effusion or pneumothorax. Osteopenia. No acute bony abnormality identified.  IMPRESSION: Prominent ascending thoracic aorta contour, for which aneurysm or dissection cannot be excluded. Appears more prominent when compared to prior chest radiographs.   Electronically Signed   By: Curlene Dolphin M.D.   On: 10/23/2013 19:59   Ct Angio Chest Aorta W/cm &/or Wo/cm 10/24/2013   CLINICAL DATA:  Pleuritic chest pain  EXAM: CT ANGIOGRAPHY CHEST WITH CONTRAST  TECHNIQUE: Multidetector CT imaging of the chest was performed using the standard protocol during bolus administration of intravenous contrast. Multiplanar CT image reconstructions and MIPs were obtained to evaluate the vascular anatomy.  CONTRAST:  160m OMNIPAQUE IOHEXOL 350 MG/ML SOLN  COMPARISON:  None.  FINDINGS: The noncontrast scan shows patchy coronary and aortic calcifications. No hyperdense crescent, mediastinal hematoma, displaced intimal calcifications, pleural or pericardial effusion.  On CTA, Satisfactory opacification of pulmonary arteries noted, and there is no evidence of pulmonary emboli.  Adequate contrast opacification of the thoracic aorta with no evidence of dissection, aneurysm, or stenosis. There is classic 3-vessel brachiocephalic arch anatomy without proximal stenosis. The abdominal aorta seen to below  the IMA origin, demonstrating atheromatous calcified plaque but no aneurysm or dissection. There is a small focal ulceration along the right lateral wall just below level of the celiac origin. No hilar or mediastinal adenopathy. Subpleural scarring or dependent atelectasis posteriorly in both lower lobes. Lungs otherwise clear. Multiple probable hepatic cysts, largest 6.2 cm in segment 2. Several smaller Bilateral renal cysts. Unremarkable arterial phase evaluation of spleen and pancreas. Mild adrenal hyperplasia. Small hiatal hernia. Small visualized portions of small bowel and colon are nondilated. No adenopathy evident. Gallbladder nondistended. Thoracic spine and sternum intact.  Review of the MIP images confirms the above findings.  IMPRESSION: 1. Negative for acute PE to or thoracic aortic dissection. 2. Atherosclerosis, including aortic and coronary artery disease. Please note that although the presence of coronary artery calcium documents the presence of coronary artery disease, the severity of this disease and any potential stenosis cannot be assessed on this non-gated CT examination. Assessment for potential risk factor modification, dietary therapy or pharmacologic therapy may be warranted, if clinically indicated.   Electronically Signed   By: Arne Cleveland M.D.   On: 10/24/2013 08:07   Labs: Results for orders placed during the hospital encounter of 10/23/13 (from the past 48 hour(s))  CBC     Status: Abnormal   Collection Time    10/23/13  6:47 PM      Result Value Ref Range   WBC 17.1 (*) 4.0 - 10.5 K/uL   RBC 4.72  4.22 - 5.81 MIL/uL   Hemoglobin 14.7  13.0 - 17.0 g/dL   HCT 43.0  39.0 - 52.0 %   MCV 91.1  78.0 - 100.0 fL   MCH 31.1  26.0 - 34.0 pg   MCHC 34.2  30.0 - 36.0 g/dL   RDW 15.3  11.5 - 15.5 %   Platelets 240  150 - 400 K/uL  BASIC METABOLIC PANEL     Status: Abnormal   Collection Time    10/23/13  6:47 PM      Result Value Ref Range   Sodium 146  137 - 147 mEq/L    Potassium 3.9  3.7 - 5.3 mEq/L   Chloride 109  96 - 112 mEq/L   CO2 22  19 - 32 mEq/L   Glucose, Bld 104 (*) 70 - 99 mg/dL   BUN 16  6 - 23 mg/dL   Creatinine, Ser 1.07  0.50 - 1.35 mg/dL   Calcium 9.0  8.4 - 10.5 mg/dL   GFR calc non Af Amer 69 (*) >90 mL/min   GFR calc Af Amer 80 (*) >90 mL/min   Comment: (NOTE)     The eGFR has been calculated using the CKD EPI equation.     This calculation has not been validated in all clinical situations.     eGFR's persistently <90 mL/min signify possible Chronic Kidney     Disease.  PRO B NATRIURETIC PEPTIDE     Status: Abnormal   Collection Time    10/23/13  6:47 PM      Result Value Ref Range   Pro B Natriuretic peptide (BNP) 344.6 (*) 0 - 125 pg/mL  TROPONIN I     Status: None   Collection Time    10/23/13  6:47 PM      Result Value Ref  Range   Troponin I <0.30  <0.30 ng/mL   Comment:            Due to the release kinetics of cTnI,     a negative result within the first hours     of the onset of symptoms does not rule out     myocardial infarction with certainty.     If myocardial infarction is still suspected,     repeat the test at appropriate intervals.  Randolm Idol, ED     Status: None   Collection Time    10/23/13  6:58 PM      Result Value Ref Range   Troponin i, poc 0.00  0.00 - 0.08 ng/mL   Comment 3            Comment: Due to the release kinetics of cTnI,     a negative result within the first hours     of the onset of symptoms does not rule out     myocardial infarction with certainty.     If myocardial infarction is still suspected,     repeat the test at appropriate intervals.  MRSA PCR SCREENING     Status: None   Collection Time    10/23/13  9:24 PM      Result Value Ref Range   MRSA by PCR NEGATIVE  NEGATIVE   Comment:            The GeneXpert MRSA Assay (FDA     approved for NASAL specimens     only), is one component of a     comprehensive MRSA colonization     surveillance program. It is not      intended to diagnose MRSA     infection nor to guide or     monitor treatment for     MRSA infections.  TSH     Status: None   Collection Time    10/24/13  5:00 AM      Result Value Ref Range   TSH 1.150  0.350 - 4.500 uIU/mL   Comment: Please note change in reference range.  HEMOGLOBIN A1C     Status: None   Collection Time    10/24/13  5:00 AM      Result Value Ref Range   Hemoglobin A1C 5.4  <5.7 %   Comment: (NOTE)                                                                               According to the ADA Clinical Practice Recommendations for 2011, when     HbA1c is used as a screening test:      >=6.5%   Diagnostic of Diabetes Mellitus               (if abnormal result is confirmed)     5.7-6.4%   Increased risk of developing Diabetes Mellitus     References:Diagnosis and Classification of Diabetes Mellitus,Diabetes     RKYH,0623,76(EGBTD 1):S62-S69 and Standards of Medical Care in             Diabetes - 2011,Diabetes Care,2011,34 (Suppl 1):S11-S61.   Mean Plasma Glucose 108  <117 mg/dL  Comment: Performed at [facility]  CBC     Status: Abnormal   Collection Time    10/24/13  5:00 AM      Result Value Ref Range   WBC 15.5 (*) 4.0 - 10.5 K/uL   RBC 4.11 (*) 4.22 - 5.81 MIL/uL   Hemoglobin 12.4 (*) 13.0 - 17.0 g/dL   HCT 37.8 (*) 39.0 - 52.0 %   MCV 92.0  78.0 - 100.0 fL   MCH 30.2  26.0 - 34.0 pg   MCHC 32.8  30.0 - 36.0 g/dL   RDW 15.6 (*) 11.5 - 15.5 %   Platelets 200  150 - 400 K/uL  LIPID PANEL     Status: Abnormal   Collection Time    10/24/13  5:21 AM      Result Value Ref Range   Cholesterol 172  0 - 200 mg/dL   Triglycerides 59  <150 mg/dL   HDL 43  >39 mg/dL   Total CHOL/HDL Ratio 4.0     VLDL 12  0 - 40 mg/dL   LDL Cholesterol 117 (*) 0 - 99 mg/dL   Comment:            Total Cholesterol/HDL:CHD Risk     Coronary Heart Disease Risk Table                         Men   Women      1/2 Average Risk   3.4   3.3      Average Risk        5.0   4.4      2 X Average Risk   9.6   7.1      3 X Average Risk  23.4   11.0                Use the calculated Patient Ratio     above and the CHD Risk Table     to determine the patient's CHD Risk.                ATP III CLASSIFICATION (LDL):      <100     mg/dL   Optimal      100-129  mg/dL   Near or Above                        Optimal      130-159  mg/dL   Borderline      160-189  mg/dL   High      >190     mg/dL   Very High  TROPONIN I     Status: None   Collection Time    10/24/13 10:40 AM      Result Value Ref Range   Troponin I <0.30  <0.30 ng/mL   Comment:            Due to the release kinetics of cTnI,     a negative result within the first hours     of the onset of symptoms does not rule out     myocardial infarction with certainty.     If myocardial infarction is still suspected,     repeat the test at appropriate intervals.    Disposition:      Follow-up Information   Follow up with Richardson Dopp, PA-C On 11/12/2013. (9:50)    Specialty:  Physician Assistant   Contact information:  [address] [place] [address] [phone]       Discharge Medications:    Medication List         amLODipine 5 MG tablet  Commonly known as:  NORVASC  Take 1 tablet (5 mg total) by mouth daily.     aspirin 325 MG EC tablet  Take 1 tablet (325 mg total) by mouth daily.     lovastatin 20 MG tablet  Commonly known as:  MEVACOR  Take 1 tablet (20 mg total) by mouth at bedtime.     metoprolol succinate 25 MG 24 hr tablet  Commonly known as:  TOPROL XL  Take 1 tablet (25 mg total) by mouth daily.     nitroGLYCERIN 0.4 MG SL tablet  Commonly known as:  NITROSTAT  Place under your tongue if you have severe chest pressure or tightness     oxyCODONE-acetaminophen 5-325 MG per tablet  Commonly known as:  PERCOCET  Take 1-2 tablets by mouth every 6 (six) hours as needed for severe pain.         Duration of Discharge Encounter: Greater than  30 minutes including physician time.  Signed, Kerin Ransom PA-C 10/25/2013 9:33 AM  Addendum: 1:00pm- as the pt was being discharged he complained of chest pain, no change with NTG. The pt still wanted to go home, he did request Percocet and he was provided an Rx for # 20 with no refills.   Kerin Ransom PA-C 10/25/2013 9:33 AM  Addendum:  Called by RN pt now pain free but B/P 196/116. I suggested pt stay and have B/P treated. I added Clonidine 01 mg X 1 and started Norvasc 5 mg daily.  Kerin Ransom PA-C  10/24/2013  3:12 PM  Addendum:  Pt held overnight. He had another episode of chest pain, mild and unlikely to be cardiac in origin. He agrees to smoking cessation. BP is much improved. His cholesterol medication was changed to a $4 med and the metoprolol was changed to Toprol XL for once daily dosing. He was evaluated by Dr. Radford Pax and considered stable for discharge, to follow up as an outpatient. Med list, labs and instructions updated.  Lonn Georgia, PA-C 10/25/2013 9:33 AM Beeper [phone]

## 2013-10-24 NOTE — Progress Notes (Deleted)
Pt education completed; pt understand when to call MD & when to call 911.  Pt understands when to take SLNTG; all pt belongings returned to pt; no further questions or concerns at this time; emotional support given;

## 2013-10-24 NOTE — Progress Notes (Signed)
Pt is refusing his heparin gtt to be started; states "I have seen those commercials on TV, and I know that stuff will kill me." I educated pt on importance of blood thinners and consequences of not being on blood thinners and pt continues to strongly refuse.  MD and pharmacist made aware;

## 2013-10-24 NOTE — Progress Notes (Signed)
Pt c/o of 6/10 CP mid-L sided, up into jaw; state EKG obtained; 1 SLNTG given with no relief, but pt refused 2nd SLNTG; requested percocet - states he "feels like it is more shoulder pain that is going down into his chest." percocet given - 30 minutes later pain down to a 5/10, and after 1 hr, down to a 1/10; MD made aware and ordered OK to discharge;

## 2013-10-24 NOTE — Discharge Instructions (Signed)
Chest Pain (Nonspecific) °It is often hard to give a specific diagnosis for the cause of chest pain. There is always a chance that your pain could be related to something serious, such as a heart attack or a blood clot in the lungs. You need to follow up with your caregiver for further evaluation. °CAUSES  °· Heartburn. °· Pneumonia or bronchitis. °· Anxiety or stress. °· Inflammation around your heart (pericarditis) or lung (pleuritis or pleurisy). °· A blood clot in the lung. °· A collapsed lung (pneumothorax). It can develop suddenly on its own (spontaneous pneumothorax) or from injury (trauma) to the chest. °· Shingles infection (herpes zoster virus). °The chest wall is composed of bones, muscles, and cartilage. Any of these can be the source of the pain. °· The bones can be bruised by injury. °· The muscles or cartilage can be strained by coughing or overwork. °· The cartilage can be affected by inflammation and become sore (costochondritis). °DIAGNOSIS  °Lab tests or other studies, such as X-rays, electrocardiography, stress testing, or cardiac imaging, may be needed to find the cause of your pain.  °TREATMENT  °· Treatment depends on what may be causing your chest pain. Treatment may include: °· Acid blockers for heartburn. °· Anti-inflammatory medicine. °· Pain medicine for inflammatory conditions. °· Antibiotics if an infection is present. °· You may be advised to change lifestyle habits. This includes stopping smoking and avoiding alcohol, caffeine, and chocolate. °· You may be advised to keep your head raised (elevated) when sleeping. This reduces the chance of acid going backward from your stomach into your esophagus. °· Most of the time, nonspecific chest pain will improve within 2 to 3 days with rest and mild pain medicine. °HOME CARE INSTRUCTIONS  °· If antibiotics were prescribed, take your antibiotics as directed. Finish them even if you start to feel better. °· For the next few days, avoid physical  activities that bring on chest pain. Continue physical activities as directed. °· Do not smoke. °· Avoid drinking alcohol. °· Only take over-the-counter or prescription medicine for pain, discomfort, or fever as directed by your caregiver. °· Follow your caregiver's suggestions for further testing if your chest pain does not go away. °· Keep any follow-up appointments you made. If you do not go to an appointment, you could develop lasting (chronic) problems with pain. If there is any problem keeping an appointment, you must call to reschedule. °SEEK MEDICAL CARE IF:  °· You think you are having problems from the medicine you are taking. Read your medicine instructions carefully. °· Your chest pain does not go away, even after treatment. °· You develop a rash with blisters on your chest. °SEEK IMMEDIATE MEDICAL CARE IF:  °· You have increased chest pain or pain that spreads to your arm, neck, jaw, back, or abdomen. °· You develop shortness of breath, an increasing cough, or you are coughing up blood. °· You have severe back or abdominal pain, feel nauseous, or vomit. °· You develop severe weakness, fainting, or chills. °· You have a fever. °THIS IS AN EMERGENCY. Do not wait to see if the pain will go away. Get medical help at once. Call your local emergency services (911 in [place] Do not drive yourself to the hospital. °MAKE SURE YOU:  °· Understand these instructions. °· Will watch your condition. °· Will get help right away if you are not doing well or get worse. °Document Released: 03/22/2005 Document Revised: 09/04/2011 Document Reviewed: 01/16/2008 °ExitCare® Patient Information ©2014 [employer]   [employer]. °Coronary Angiography °Coronary angiography is an X-ray procedure used to look at the arteries in the heart. In this procedure, a dye (contrast dye) is injected through a long, hollow tube (catheter). The catheter is about the size of a piece of cooked spaghetti and is inserted through your groin, wrist, or arm. The  dye is injected into each artery, and X-rays are then taken to show if there is a blockage in the arteries of your heart. °LET YOUR HEALTH CARE PROVIDER KNOW ABOUT: °· Any allergies you have, including allergies to shellfish or contrast dye.   °· All medicines you are taking, including vitamins, herbs, eye drops, creams, and over-the-counter medicines.   °· Previous problems you or members of your family have had with the use of anesthetics.   °· Any blood disorders you have.   °· Previous surgeries you have had. °· History of kidney problems or failure.   °· Other medical conditions you have. °RISKS AND COMPLICATIONS  °Generally, coronary angiography is a safe procedure. However, as with any procedure, complications can occur. Possible complications include: °· Allergic reaction to the dye. °· Bleeding from the access site or other locations. °· Kidney injury, especially in people with impaired kidney function.  °· Stroke (rare). °· Heart attack (rare). °BEFORE THE PROCEDURE  °· Do not eat or drink anything after midnight the night before the procedure, or as directed by your health care provider.   °· Ask your health care provider if it is okay to take any needed medicines with a sip of water.   °PROCEDURE °· You may be given a medicine to help you relax (sedative) before the procedure. This medicine is given through an intravenous (IV) access tube that is inserted into one of your veins.   °· The area where the catheter will be inserted is washed and shaved. This is usually done in the groin but may be done in the fold of your arm (near your elbow) or in the wrist.    °· A medicine will be given to numb the area where the catheter will be inserted (local anesthetic).   °· The health care provider will insert the catheter into an artery. The catheter is guided by using a special type of X-ray (fluoroscopy) of the blood vessel being examined.   °· A special dye is then injected into the catheter, and X-rays are  taken. The dye helps to show where any narrowing or blockages are located in the heart arteries.   °AFTER THE PROCEDURE  °· If the procedure is done through the leg, you will be kept in bed lying flat for several hours. You will be instructed to not bend or cross your legs. °· The insertion site will be checked frequently.   °· The pulse in your feet or wrist will be checked frequently.   °· Additional blood tests, X-rays, and an electrocardiogram may be done.   °· You may need to stay in the hospital overnight for observation.   °Document Released: 12/17/2002 Document Revised: 02/12/2013 Document Reviewed: 11/04/2012 °ExitCare® Patient Information ©2014 [employer]. ° °

## 2013-10-25 DIAGNOSIS — I219 Acute myocardial infarction, unspecified: Secondary | ICD-10-CM

## 2013-10-25 DIAGNOSIS — E785 Hyperlipidemia, unspecified: Secondary | ICD-10-CM

## 2013-10-25 DIAGNOSIS — F17200 Nicotine dependence, unspecified, uncomplicated: Secondary | ICD-10-CM

## 2013-10-25 DIAGNOSIS — R072 Precordial pain: Secondary | ICD-10-CM

## 2013-10-25 MED ORDER — AMLODIPINE BESYLATE 5 MG PO TABS: 5.0000 mg | ORAL_TABLET | Freq: Every day | ORAL | Status: DC

## 2013-10-25 MED ORDER — ASPIRIN 325 MG PO TBEC: 325.0000 mg | DELAYED_RELEASE_TABLET | Freq: Every day | ORAL | Status: DC

## 2013-10-25 MED ORDER — METOPROLOL TARTRATE 12.5 MG HALF TABLET: 12.5000 mg | ORAL_TABLET | Freq: Two times a day (BID) | ORAL | Status: DC

## 2013-10-25 MED ORDER — LOVASTATIN 20 MG PO TABS: 20.0000 mg | ORAL_TABLET | Freq: Every day | ORAL | Status: DC

## 2013-10-25 MED ORDER — METOPROLOL SUCCINATE ER 25 MG PO TB24: 25.0000 mg | ORAL_TABLET | Freq: Every day | ORAL | Status: DC

## 2013-10-25 MED ORDER — NITROGLYCERIN 0.4 MG SL SUBL: SUBLINGUAL_TABLET | SUBLINGUAL | Status: DC

## 2013-10-25 NOTE — Progress Notes (Signed)
SUBJECTIVE:  Very brief episode of mild CP earlier this am  OBJECTIVE:   Vitals:   Filed Vitals:   10/24/13 1700 10/24/13 2147 10/24/13 2208 10/25/13 0540  BP: 183/105 158/90  122/85  Pulse:  84 75 82  Temp: 97.6 F (36.4 C) 97.8 F (36.6 C)  97.4 F (36.3 C)  TempSrc: Oral Oral  Oral  Resp: 21 20 20 20   Height: 5\' 10"  (1.778 m)     Weight:    128 lb 12.8 oz (58.423 kg)  SpO2: 99% 96% 96% 98%   I&O's:   Intake/Output Summary (Last 24 hours) at 10/25/13 0851 Last data filed at 10/25/13 0541  Gross per 24 hour  Intake  35.47 ml  Output   1025 ml  Net -989.53 ml   TELEMETRY: Reviewed telemetry pt in NSR:     PHYSICAL EXAM General: Well developed, well nourished, in no acute distress Head: Eyes PERRLA, No xanthomas.   Normal cephalic and atramatic  Lungs:   Clear bilaterally to auscultation and percussion. Heart:   HRRR S1 S2 Pulses are 2+ & equal. Abdomen: Bowel sounds are positive, abdomen soft and non-tender without masses Extremities:   No clubbing, cyanosis or edema.  DP +1 Neuro: Alert and oriented X 3. Psych:  Good affect, responds appropriately   LABS: Basic Metabolic Panel:  Recent Labs  16/03/9603/30/15 1847  NA 146  K 3.9  CL 109  CO2 22  GLUCOSE 104*  BUN 16  CREATININE 1.07  CALCIUM 9.0   Liver Function Tests: No results found for this basename: AST, ALT, ALKPHOS, BILITOT, PROT, ALBUMIN,  in the last 72 hours No results found for this basename: LIPASE, AMYLASE,  in the last 72 hours CBC:  Recent Labs  10/23/13 1847 10/24/13 0500  WBC 17.1* 15.5*  HGB 14.7 12.4*  HCT 43.0 37.8*  MCV 91.1 92.0  PLT 240 200   Cardiac Enzymes:  Recent Labs  10/23/13 1847 10/24/13 1040  TROPONINI <0.30 <0.30   BNP: No components found with this basename: POCBNP,  D-Dimer: No results found for this basename: DDIMER,  in the last 72 hours Hemoglobin A1C:  Recent Labs  10/24/13 0500  HGBA1C 5.4   Fasting Lipid Panel:  Recent Labs  10/24/13 0521   CHOL 172  HDL 43  LDLCALC 117*  TRIG 59  CHOLHDL 4.0   Thyroid Function Tests:  Recent Labs  10/24/13 0500  TSH 1.150   Anemia Panel: No results found for this basename: VITAMINB12, FOLATE, FERRITIN, TIBC, IRON, RETICCTPCT,  in the last 72 hours Coag Panel:   No results found for this basename: INR, PROTIME    RADIOLOGY: Dg Chest Port 1 View  10/23/2013   CLINICAL DATA:  Chest pain shortness of breath.  EXAM: PORTABLE CHEST - 1 VIEW  COMPARISON:  Chest radiograph 11/02/2005.  FINDINGS: Heart size is normal. There is a prominent ascending thoracic aorta contour that appears more prominent compared to chest radiograph dated 11/02/2005. Ascending thoracic aortic aneurysm or aortic dissection cannot be excluded. Lung volumes are low. No focal airspace disease. Negative for pleural effusion or pneumothorax. Osteopenia. No acute bony abnormality identified.  IMPRESSION: Prominent ascending thoracic aorta contour, for which aneurysm or dissection cannot be excluded. Appears more prominent when compared to prior chest radiographs.   Electronically Signed   By: Britta MccreedySusan  Zeta Bucy M.D.   On: 10/23/2013 19:59   Ct Angio Chest Aorta W/cm &/or Wo/cm  10/24/2013   CLINICAL DATA:  Pleuritic chest pain  EXAM: CT ANGIOGRAPHY CHEST WITH CONTRAST  TECHNIQUE: Multidetector CT imaging of the chest was performed using the standard protocol during bolus administration of intravenous contrast. Multiplanar CT image reconstructions and MIPs were obtained to evaluate the vascular anatomy.  CONTRAST:  OMNIPAQUE IOHEXOL 350 MG/ML SOLN  COMPARISON:  None.  FINDINGS: The noncontrast scan shows patchy coronary and aortic calcifications. No hyperdense crescent, mediastinal hematoma, displaced intimal calcifications, pleural or pericardial effusion.  On CTA, Satisfactory opacification of pulmonary arteries noted, and there is no evidence of pulmonary emboli. Adequate contrast opacification of the thoracic aorta with no  evidence of dissection, aneurysm, or stenosis. There is classic 3-vessel brachiocephalic arch anatomy without proximal stenosis. The abdominal aorta seen to below the IMA origin, demonstrating atheromatous calcified plaque but no aneurysm or dissection. There is a small focal ulceration along the right lateral wall just below level of the celiac origin. No hilar or mediastinal adenopathy. Subpleural scarring or dependent atelectasis posteriorly in both lower lobes. Lungs otherwise clear. Multiple probable hepatic cysts, largest 6.2 cm in segment 2. Several smaller Bilateral renal cysts. Unremarkable arterial phase evaluation of spleen and pancreas. Mild adrenal hyperplasia. Small hiatal hernia. Small visualized portions of small bowel and colon are nondilated. No adenopathy evident. Gallbladder nondistended. Thoracic spine and sternum intact.  Review of the MIP images confirms the above findings.  IMPRESSION: 1. Negative for acute PE to or thoracic aortic dissection. 2. Atherosclerosis, including aortic and coronary artery disease. Please note that although the presence of coronary artery calcium documents the presence of coronary artery disease, the severity of this disease and any potential stenosis cannot be assessed on this non-gated CT examination. Assessment for potential risk factor modification, dietary therapy or pharmacologic therapy may be warranted, if clinically indicated.   Electronically Signed   By: Oley Balm M.D.   On: 10/24/2013 08:07   Assessment/Plan:  1. Chest pain unclear etiology, possibly musculoskeletal but doubt ischemic in origin  2. CAD, obstructive with involvement of the left main and proximal LAD  3. Heavy tobacco  4. Medical noncompliance  Pt has significant left main and LAD stenosis , but Dr. Excell Seltzer felt his resting chest pain is unlikely to be cardiac in origin. His pain is completely resolved despite no intervention and CAD is not severe enough to cause resting  angina. Dr Excell Seltzer reviewed potential treatment with patient which include medical therapy versus CABG. He refuses consideration of CABG - states 'I'm ready to go when it's my time.' He also refuses to take any 'blood-thinning' medications. He has refused heparin here in the hospital. He agrees to take a daily aspirin. Also agrees to take metoprolol and atorvastatin.  Dr. Excell Seltzer advised that he refrain from strenuous activities. He understands to call if he develops symptoms of angina. Tobacco cessation counseling done and he states 'I have smoked my last cigar.'  Plan: BP much improved this am so will d/c home this am. Follow-up with physician extender within 2 weeks.  Followup with nurse in office Monday to recheck BP.      Quintella Reichert, MD  10/25/2013  8:51 AM

## 2013-10-27 NOTE — Discharge Summary (Signed)
Agree with discharge summary as outlined by Rhonda Barrett, PA-C 

## 2013-11-11 ENCOUNTER — Encounter: Payer: Self-pay | Admitting: *Deleted

## 2013-11-12 ENCOUNTER — Encounter: Payer: Self-pay | Admitting: Physician Assistant

## 2013-11-12 ENCOUNTER — Encounter: Payer: Medicare Other | Admitting: Physician Assistant

## 2013-11-12 DIAGNOSIS — I10 Essential (primary) hypertension: Secondary | ICD-10-CM | POA: Insufficient documentation

## 2013-11-12 NOTE — Progress Notes (Deleted)
[address] [place], [place]  [address] Phone: [phone] Fax:  [phone]  Date:  11/12/2013   ID:  Ray Smith, DOB 04-Nov-1945, MRN [id number]  PCP:  No PCP Per Patient  Cardiologist:  Dr. Verdis Prime      History of Present Illness: Ray Smith is a 68 y.o. male admitted 4/30-5/2 with chest pain and inferior STE concerning for STEMI.  Emergent LHC demonstrated significant LM and LAD stenosis.  LV function was normal without WM abnormality.  CEs remained negative.  Clinical syndrome was not felt to be ischemic pain.  Pain did resolve spontaneously.  Dr. Katrinka Blazing did his LHC and did not feel he required immediate intervention.  Dr. Excell Seltzer also reviewed his films and saw the patient prior to d/c.  Anatomy was not felt to be severe enough to cause resting pain.  Med Rx vs CABG reviewed with patient.  He refused CABG.  He refused IV Heparin but did agree to take ASA, Metoprolol and Atorvastatin.  D/c was delayed due to elevated BP and recurrent CP.    ***   Studies:  - LHC (10/23/13):  Dist LM 40-60%, ostial LAD 80%, mid LAD 50%, ostial CFX 50-70%, mid RCA 50%.  EF 60%.    - Chest CTA (10/24/13):  No PE or dissection  Recent Labs: 10/23/2013: Creatinine 1.07; Potassium 3.9; Pro B Natriuretic peptide (BNP) 344.6*  10/24/2013: HDL Cholesterol by NMR 43; Hemoglobin 12.4*; LDL (calc) 117*; TSH 1.150   Wt Readings from Last 3 Encounters:  10/25/13 128 lb 12.8 oz (58.423 kg)  10/25/13 128 lb 12.8 oz (58.423 kg)     Past Medical History  Diagnosis Date  . Coronary artery disease     a.  presented with CP and inf STE but no ACS - LHC (10/23/13):  Dist LM 40-60%, ostial LAD 80%, mid LAD 50%, ostial CFX 50-70%, mid RCA 50%.  EF 60%. - CP not felt to be ischemic; CABG vs Med Rx d/w pt - pt opted for Med Rx  . Hypertension   . Hyperlipidemia     Current Outpatient Prescriptions  Medication Sig Dispense Refill  . amLODipine (NORVASC) 5 MG tablet Take 1 tablet (5 mg total) by mouth  daily.  30 tablet  11  . aspirin 325 MG EC tablet Take 1 tablet (325 mg total) by mouth daily.  30 tablet  0  . lovastatin (MEVACOR) 20 MG tablet Take 1 tablet (20 mg total) by mouth at bedtime.  30 tablet  11  . metoprolol succinate (TOPROL XL) 25 MG 24 hr tablet Take 1 tablet (25 mg total) by mouth daily.  30 tablet  11  . nitroGLYCERIN (NITROSTAT) 0.4 MG SL tablet Place under your tongue if you have severe chest pressure or tightness  25 tablet  2  . oxyCODONE-acetaminophen (PERCOCET) 5-325 MG per tablet Take 1-2 tablets by mouth every 6 (six) hours as needed for severe pain.  20 tablet  0   No current facility-administered medications for this visit.    Allergies:   Flexeril   Social History:  The patient  reports that he has been smoking.  He does not have any smokeless tobacco history on file. He reports that he does not drink alcohol.   Family History:  The patient's family history is not on file.   ROS:  Please see the history of present illness.   ***   All other systems reviewed and negative.   PHYSICAL EXAM: VS:  There were no vitals taken for this visit. Well nourished, well developed, in no acute distress HEENT: normal Neck: ***no JVD Cardiac:  normal S1, S2; ***RRR; no murmur Lungs:  ***clear to auscultation bilaterally, no wheezing, rhonchi or rales Abd: soft, nontender, no hepatomegaly Ext: ***no edema Skin: warm and dry Neuro:  CNs 2-12 intact, no focal abnormalities noted  EKG:  ***     ASSESSMENT AND PLAN:  Coronary Artery Disease  Hypertension  Dyslipidemia  Tobacco abuse ***  Signed, Tereso NewcomerScott Kashden Deboy, PA-C  11/12/2013 8:44 AM

## 2013-11-13 NOTE — Progress Notes (Signed)
This encounter was created in error - please disregard.

## 2013-12-03 ENCOUNTER — Encounter: Payer: Self-pay | Admitting: Physician Assistant

## 2014-06-04 ENCOUNTER — Encounter (HOSPITAL_COMMUNITY): Payer: Self-pay | Admitting: Interventional Cardiology

## 2015-04-20 ENCOUNTER — Encounter (HOSPITAL_COMMUNITY): Payer: Self-pay | Admitting: Emergency Medicine

## 2015-04-20 ENCOUNTER — Emergency Department (HOSPITAL_COMMUNITY): Payer: Medicare Other

## 2015-04-20 ENCOUNTER — Emergency Department (HOSPITAL_COMMUNITY)
Admission: EM | Admit: 2015-04-20 | Discharge: 2015-04-21 | Disposition: A | Discharge status: Home / Self Care | Payer: Medicare Other | Attending: Emergency Medicine | Admitting: Emergency Medicine

## 2015-04-20 DIAGNOSIS — Z720 Tobacco use: Secondary | ICD-10-CM | POA: Diagnosis not present

## 2015-04-20 DIAGNOSIS — R0789 Other chest pain: Secondary | ICD-10-CM | POA: Diagnosis not present

## 2015-04-20 DIAGNOSIS — Z7982 Long term (current) use of aspirin: Secondary | ICD-10-CM | POA: Diagnosis not present

## 2015-04-20 DIAGNOSIS — K2970 Gastritis, unspecified, without bleeding: Secondary | ICD-10-CM | POA: Insufficient documentation

## 2015-04-20 DIAGNOSIS — I2510 Atherosclerotic heart disease of native coronary artery without angina pectoris: Secondary | ICD-10-CM | POA: Diagnosis not present

## 2015-04-20 DIAGNOSIS — R0602 Shortness of breath: Secondary | ICD-10-CM | POA: Insufficient documentation

## 2015-04-20 DIAGNOSIS — R079 Chest pain, unspecified: Secondary | ICD-10-CM | POA: Diagnosis present

## 2015-04-20 DIAGNOSIS — E785 Hyperlipidemia, unspecified: Secondary | ICD-10-CM | POA: Diagnosis not present

## 2015-04-20 DIAGNOSIS — R05 Cough: Secondary | ICD-10-CM | POA: Diagnosis not present

## 2015-04-20 DIAGNOSIS — Z79899 Other long term (current) drug therapy: Secondary | ICD-10-CM | POA: Insufficient documentation

## 2015-04-20 DIAGNOSIS — I10 Essential (primary) hypertension: Secondary | ICD-10-CM | POA: Diagnosis not present

## 2015-04-20 LAB — BASIC METABOLIC PANEL
ANION GAP: 9 (ref 5–15)
BUN: 15 mg/dL (ref 6–20)
CALCIUM: 9.4 mg/dL (ref 8.9–10.3)
CO2: 27 mmol/L (ref 22–32)
Chloride: 103 mmol/L (ref 101–111)
Creatinine, Ser: 1.15 mg/dL (ref 0.61–1.24)
Glucose, Bld: 107 mg/dL — ABNORMAL HIGH (ref 65–99)
Potassium: 4.3 mmol/L (ref 3.5–5.1)
Sodium: 139 mmol/L (ref 135–145)

## 2015-04-20 LAB — CBC
HCT: 46 % (ref 39.0–52.0)
Hemoglobin: 14.9 g/dL (ref 13.0–17.0)
MCH: 30.1 pg (ref 26.0–34.0)
MCHC: 32.4 g/dL (ref 30.0–36.0)
MCV: 92.9 fL (ref 78.0–100.0)
Platelets: 271 10*3/uL (ref 150–400)
RBC: 4.95 MIL/uL (ref 4.22–5.81)
RDW: 15 % (ref 11.5–15.5)
WBC: 9.5 10*3/uL (ref 4.0–10.5)

## 2015-04-20 LAB — I-STAT TROPONIN, ED: TROPONIN I, POC: 0.01 ng/mL (ref 0.00–0.08)

## 2015-04-20 NOTE — ED Notes (Signed)
Pt. reports mid chest / epigastric pain with mild SOB and diaphoresis onset this week , no nausea or vomitting / no cough or congestion .

## 2015-04-20 NOTE — ED Provider Notes (Signed)
CSN: [id number]     Arrival date & time 04/20/15  1952 History  By signing my name below, I, Lyndel Safe, attest that this documentation has been prepared under the direction and in the presence of Loren Racer, MD. Electronically Signed: Lyndel Safe, ED Scribe. 04/20/2015. 12:06 AM.   Chief Complaint  Patient presents with  . Chest Pain   The history is provided by the patient. No language interpreter was used.    HPI Comments: Ray Smith is a 69 y.o. male, with a PMhx of GERD, CAD, HTN, and HLD, who presents to the Emergency Department complaining of intermittent, severe burning lower sternal chest pain that radiates to mid back onset 8 days ago but worsening with onset this evening. Pt reports his pain seems to increase each time it returns and is worse at night, with coughing, or drinking warm fluids. He associates a productive cough. Pt endorses taking Nexium this evening without significant relief. He has a h/o cardiac catheterization 1 year ago by Dr. Katrinka Blazing. The pt was recommended to undergo CABG but did not and has no cardiac stents. Denies melena, hematochezia, nausea or vomiting.   Past Medical History  Diagnosis Date  . Coronary artery disease     a.  presented with CP and inf STE but no ACS - LHC (10/23/13):  Dist LM 40-60%, ostial LAD 80%, mid LAD 50%, ostial CFX 50-70%, mid RCA 50%.  EF 60%. - CP not felt to be ischemic; CABG vs Med Rx d/w pt - pt opted for Med Rx  . Hypertension   . Hyperlipidemia    Past Surgical History  Procedure Laterality Date  . Snake bite surgery    . Left heart catheterization with coronary angiogram Bilateral 10/23/2013    Procedure: LEFT HEART CATHETERIZATION WITH CORONARY ANGIOGRAM;  Surgeon: Lesleigh Noe, MD;  Location: [facility] CATH LAB;  Service: Cardiovascular;  Laterality: Bilateral;   No family history on file. Social History  Substance Use Topics  . Smoking status: Current Some Day Smoker  . Smokeless tobacco: None  . Alcohol  Use: No    Review of Systems  Constitutional: Negative for fever and chills.  Respiratory: Positive for cough and shortness of breath. Negative for chest tightness.   Cardiovascular: Positive for chest pain. Negative for palpitations and leg swelling.  Gastrointestinal: Negative for nausea, vomiting, diarrhea and blood in stool.  Skin: Negative for rash and wound.  Neurological: Negative for dizziness, weakness, light-headedness, numbness and headaches.  All other systems reviewed and are negative.  Allergies  Flexeril  Home Medications   Prior to Admission medications   Medication Sig Start Date End Date Taking? Authorizing Provider  amLODipine (NORVASC) 5 MG tablet Take 1 tablet (5 mg total) by mouth daily. 10/25/13   Rhonda G Barrett, PA-C  aspirin 325 MG EC tablet Take 1 tablet (325 mg total) by mouth daily. 10/25/13   Rhonda G Barrett, PA-C  lovastatin (MEVACOR) 20 MG tablet Take 1 tablet (20 mg total) by mouth at bedtime. 10/25/13   Rhonda G Barrett, PA-C  metoprolol succinate (TOPROL XL) 25 MG 24 hr tablet Take 1 tablet (25 mg total) by mouth daily. 10/25/13   Joline Salt Barrett, PA-C  nitroGLYCERIN (NITROSTAT) 0.4 MG SL tablet Place under your tongue if you have severe chest pressure or tightness 10/25/13   Rhonda G Barrett, PA-C  oxyCODONE-acetaminophen (PERCOCET) 5-325 MG per tablet Take 1-2 tablets by mouth every 6 (six) hours as needed for severe pain. 10/24/13  Luke K Kilroy, PA-C  pantoprazole (PROTONIX) 40 MG tablet Take 1 tablet (40 mg total) by mouth daily. 04/21/15   Loren Racer, MD  sucralfate (CARAFATE) 1 G tablet Take 1 tablet (1 g total) by mouth 2 (two) times daily as needed (upper abdominal pain). 04/21/15   Loren Racer, MD  traMADol (ULTRAM) 50 MG tablet Take 1 tablet (50 mg total) by mouth every 6 (six) hours as needed. 04/21/15   Loren Racer, MD   BP 132/86 mmHg  Pulse 92  Temp(Src) 97.6 F (36.4 C) (Oral)  Resp 16  SpO2 97% Physical Exam  Constitutional:  He is oriented to person, place, and time. He appears well-developed and well-nourished. No distress.  HENT:  Head: Normocephalic and atraumatic.  Mouth/Throat: Oropharynx is clear and moist.  Eyes: EOM are normal. Pupils are equal, round, and reactive to light.  Neck: Normal range of motion. Neck supple.  Cardiovascular: Normal rate and regular rhythm.   Pulmonary/Chest: Effort normal and breath sounds normal. No respiratory distress. He has no wheezes. He has no rales.  Abdominal: Soft. Bowel sounds are normal. He exhibits no distension and no mass. There is tenderness (mild tenderness to palpation the left upper quadrant.). There is no rebound and no guarding.  Musculoskeletal: Normal range of motion. He exhibits no edema or tenderness.  No lower extremity swelling or pain.  Neurological: He is alert and oriented to person, place, and time.  Moves all extremities without deficit. Sensation is grossly intact.  Skin: Skin is warm and dry. No rash noted. No erythema.  Psychiatric: He has a normal mood and affect. His behavior is normal.  Nursing note and vitals reviewed.   ED Course  Procedures  DIAGNOSTIC STUDIES: Oxygen Saturation is 97% on RA, normal by my interpretation.    COORDINATION OF CARE: 12:05 AM Discussed treatment plan with pt at bedside and pt agreed to plan.   Labs Review Labs Reviewed  BASIC METABOLIC PANEL - Abnormal; Notable for the following:    Glucose, Bld 107 (*)    All other components within normal limits  HEPATIC FUNCTION PANEL - Abnormal; Notable for the following:    ALT 7 (*)    All other components within normal limits  CBC  LIPASE, BLOOD  I-STAT TROPOININ, ED  Rosezena Sensor, ED    Imaging Review Dg Chest 2 View  04/20/2015  CLINICAL DATA:  69 year old male with central chest pain and shortness breath for 1 week. EXAM: CHEST  2 VIEW COMPARISON:  Chest x-ray 10/23/2013. FINDINGS: Lung volumes are normal. No consolidative airspace disease. No  pleural effusions. No pneumothorax. No pulmonary nodule or mass noted. Pulmonary vasculature and the cardiomediastinal silhouette are within normal limits. Atherosclerosis in the thoracic aorta. IMPRESSION: 1.  No radiographic evidence of acute cardiopulmonary disease. 2. Atherosclerosis. Electronically Signed   By: Trudie Reed M.D.   On: 04/20/2015 20:46   I have personally reviewed and evaluated these images and lab results as part of my medical decision-making.   EKG Interpretation   Date/Time:  Tuesday April 20 2015 19:58:59 EDT Ventricular Rate:  99 PR Interval:  166 QRS Duration: 92 QT Interval:  340 QTC Calculation: 436 R Axis:   20 Text Interpretation:  Normal sinus rhythm Nonspecific ST abnormality  Abnormal ECG Confirmed by Ranae Palms  MD, Abubakr Wieman ([id number]) on 04/20/2015  11:49:46 PM      MDM   Final diagnoses:  Gastritis  Atypical chest pain    I personally performed the services described  in this documentation, which was scribed in my presence. The recorded information has been reviewed and is accurate.    Troponin 2 are normal. EKG without any ischemic changes. Patient states he is feeling much better after GI cocktail. He is resting comfortably. Patient has extensive coronary artery disease history and has been advised that he needs to follow-up with his cardiologist this week. He understands the need to return immediately for any worsening pain, shortness of breath or concerns. Patient also needs to follow-up with a gastroenterologist. He's been instructed to return immediately for vomiting blood or melanotic stools. Will start on PPI and given dietary precautions.   Loren Racer, MD 04/21/15 [phone]

## 2015-04-21 LAB — HEPATIC FUNCTION PANEL
ALK PHOS: 105 U/L (ref 38–126)
ALT: 7 U/L — ABNORMAL LOW (ref 17–63)
AST: 18 U/L (ref 15–41)
Albumin: 3.9 g/dL (ref 3.5–5.0)
BILIRUBIN TOTAL: 0.5 mg/dL (ref 0.3–1.2)
Bilirubin, Direct: 0.2 mg/dL (ref 0.1–0.5)
Indirect Bilirubin: 0.3 mg/dL (ref 0.3–0.9)
TOTAL PROTEIN: 6.7 g/dL (ref 6.5–8.1)

## 2015-04-21 LAB — LIPASE, BLOOD: LIPASE: 36 U/L (ref 11–51)

## 2015-04-21 LAB — I-STAT TROPONIN, ED: Troponin i, poc: 0.01 ng/mL (ref 0.00–0.08)

## 2015-04-21 MED ORDER — PANTOPRAZOLE SODIUM 40 MG IV SOLR
40.0000 mg | Freq: Once | INTRAVENOUS | Status: AC
  Administered 2015-04-21: 40 mg via INTRAVENOUS
  Filled 2015-04-21: qty 40

## 2015-04-21 MED ORDER — TRAMADOL HCL 50 MG PO TABS: 50.0000 mg | ORAL_TABLET | Freq: Four times a day (QID) | ORAL | Status: DC | PRN

## 2015-04-21 MED ORDER — SUCRALFATE 1 G PO TABS: 1.0000 g | ORAL_TABLET | Freq: Two times a day (BID) | ORAL | Status: DC | PRN

## 2015-04-21 MED ORDER — GI COCKTAIL ~~LOC~~
30.0000 mL | Freq: Once | ORAL | Status: AC
  Administered 2015-04-21: 30 mL via ORAL
  Filled 2015-04-21: qty 30

## 2015-04-21 MED ORDER — MORPHINE SULFATE (PF) 4 MG/ML IV SOLN
4.0000 mg | Freq: Once | INTRAVENOUS | Status: AC
  Administered 2015-04-21: 4 mg via INTRAVENOUS
  Filled 2015-04-21: qty 1

## 2015-04-21 MED ORDER — PANTOPRAZOLE SODIUM 40 MG PO TBEC: 40.0000 mg | DELAYED_RELEASE_TABLET | Freq: Every day | ORAL | Status: DC

## 2015-04-21 NOTE — Discharge Instructions (Signed)
Call and make an appointment to follow-up with your cardiologist. Take proton X as prescribed. If you have persistent upper abdominal pain ED to call and make an appointment to follow-up with gastroenterologist. If you notice black stools or blood in your vomit, you need to return immediately to the emergency department.  Gastritis, Adult Gastritis is soreness and swelling (inflammation) of the lining of the stomach. Gastritis can develop as a sudden onset (acute) or long-term (chronic) condition. If gastritis is not treated, it can lead to stomach bleeding and ulcers. CAUSES  Gastritis occurs when the stomach lining is weak or damaged. Digestive juices from the stomach then inflame the weakened stomach lining. The stomach lining may be weak or damaged due to viral or bacterial infections. One common bacterial infection is the Helicobacter pylori infection. Gastritis can also result from excessive alcohol consumption, taking certain medicines, or having too much acid in the stomach.  SYMPTOMS  In some cases, there are no symptoms. When symptoms are present, they may include:  Pain or a burning sensation in the upper abdomen.  Nausea.  Vomiting.  An uncomfortable feeling of fullness after eating. DIAGNOSIS  Your caregiver may suspect you have gastritis based on your symptoms and a physical exam. To determine the cause of your gastritis, your caregiver may perform the following:  Blood or stool tests to check for the H pylori bacterium.  Gastroscopy. A thin, flexible tube (endoscope) is passed down the esophagus and into the stomach. The endoscope has a light and camera on the end. Your caregiver uses the endoscope to view the inside of the stomach.  Taking a tissue sample (biopsy) from the stomach to examine under a microscope. TREATMENT  Depending on the cause of your gastritis, medicines may be prescribed. If you have a bacterial infection, such as an H pylori infection, antibiotics may be  given. If your gastritis is caused by too much acid in the stomach, H2 blockers or antacids may be given. Your caregiver may recommend that you stop taking aspirin, ibuprofen, or other nonsteroidal anti-inflammatory drugs (NSAIDs). HOME CARE INSTRUCTIONS  Only take over-the-counter or prescription medicines as directed by your caregiver.  If you were given antibiotic medicines, take them as directed. Finish them even if you start to feel better.  Drink enough fluids to keep your urine clear or pale yellow.  Avoid foods and drinks that make your symptoms worse, such as:  Caffeine or alcoholic drinks.  Chocolate.  Peppermint or mint flavorings.  Garlic and onions.  Spicy foods.  Citrus fruits, such as oranges, lemons, or limes.  Tomato-based foods such as sauce, chili, salsa, and pizza.  Fried and fatty foods.  Eat small, frequent meals instead of large meals. SEEK IMMEDIATE MEDICAL CARE IF:   You have black or dark red stools.  You vomit blood or material that looks like coffee grounds.  You are unable to keep fluids down.  Your abdominal pain gets worse.  You have a fever.  You do not feel better after 1 week.  You have any other questions or concerns. MAKE SURE YOU:  Understand these instructions.  Will watch your condition.  Will get help right away if you are not doing well or get worse.   This information is not intended to replace advice given to you by your health care provider. Make sure you discuss any questions you have with your health care provider.   Document Released: 06/06/2001 Document Revised: 12/12/2011 Document Reviewed: 07/26/2011 Elsevier Interactive Patient Education  2016 [employer]  Nonspecific Chest Pain  Chest pain can be caused by many different conditions. There is always a chance that your pain could be related to something serious, such as a heart attack or a blood clot in your lungs. Chest pain can also be caused by conditions  that are not life-threatening. If you have chest pain, it is very important to follow up with your health care provider. CAUSES  Chest pain can be caused by:  Heartburn.  Pneumonia or bronchitis.  Anxiety or stress.  Inflammation around your heart (pericarditis) or lung (pleuritis or pleurisy).  A blood clot in your lung.  A collapsed lung (pneumothorax). It can develop suddenly on its own (spontaneous pneumothorax) or from trauma to the chest.  Shingles infection (varicella-zoster virus).  Heart attack.  Damage to the bones, muscles, and cartilage that make up your chest wall. This can include:  Bruised bones due to injury.  Strained muscles or cartilage due to frequent or repeated coughing or overwork.  Fracture to one or more ribs.  Sore cartilage due to inflammation (costochondritis). RISK FACTORS  Risk factors for chest pain may include:  Activities that increase your risk for trauma or injury to your chest.  Respiratory infections or conditions that cause frequent coughing.  Medical conditions or overeating that can cause heartburn.  Heart disease or family history of heart disease.  Conditions or health behaviors that increase your risk of developing a blood clot.  Having had chicken pox (varicella zoster). SIGNS AND SYMPTOMS Chest pain can feel like:  Burning or tingling on the surface of your chest or deep in your chest.  Crushing, pressure, aching, or squeezing pain.  Dull or sharp pain that is worse when you move, cough, or take a deep breath.  Pain that is also felt in your back, neck, shoulder, or arm, or pain that spreads to any of these areas. Your chest pain may come and go, or it may stay constant. DIAGNOSIS Lab tests or other studies may be needed to find the cause of your pain. Your health care provider may have you take a test called an ambulatory ECG (electrocardiogram). An ECG records your heartbeat patterns at the time the test is  performed. You may also have other tests, such as:  Transthoracic echocardiogram (TTE). During echocardiography, sound waves are used to create a picture of all of the heart structures and to look at how blood flows through your heart.  Transesophageal echocardiogram (TEE).This is a more advanced imaging test that obtains images from inside your body. It allows your health care provider to see your heart in finer detail.  Cardiac monitoring. This allows your health care provider to monitor your heart rate and rhythm in real time.  Holter monitor. This is a portable device that records your heartbeat and can help to diagnose abnormal heartbeats. It allows your health care provider to track your heart activity for several days, if needed.  Stress tests. These can be done through exercise or by taking medicine that makes your heart beat more quickly.  Blood tests.  Imaging tests. TREATMENT  Your treatment depends on what is causing your chest pain. Treatment may include:  Medicines. These may include:  Acid blockers for heartburn.  Anti-inflammatory medicine.  Pain medicine for inflammatory conditions.  Antibiotic medicine, if an infection is present.  Medicines to dissolve blood clots.  Medicines to treat coronary artery disease.  Supportive care for conditions that do not require medicines. This may include:  Resting.  Applying heat or cold packs to injured areas.  Limiting activities until pain decreases. HOME CARE INSTRUCTIONS  If you were prescribed an antibiotic medicine, finish it all even if you start to feel better.  Avoid any activities that bring on chest pain.  Do not use any tobacco products, including cigarettes, chewing tobacco, or electronic cigarettes. If you need help quitting, ask your health care provider.  Do not drink alcohol.  Take medicines only as directed by your health care provider.  Keep all follow-up visits as directed by your health care  provider. This is important. This includes any further testing if your chest pain does not go away.  If heartburn is the cause for your chest pain, you may be told to keep your head raised (elevated) while sleeping. This reduces the chance that acid will go from your stomach into your esophagus.  Make lifestyle changes as directed by your health care provider. These may include:  Getting regular exercise. Ask your health care provider to suggest some activities that are safe for you.  Eating a heart-healthy diet. A registered dietitian can help you to learn healthy eating options.  Maintaining a healthy weight.  Managing diabetes, if necessary.  Reducing stress. SEEK MEDICAL CARE IF:  Your chest pain does not go away after treatment.  You have a rash with blisters on your chest.  You have a fever. SEEK IMMEDIATE MEDICAL CARE IF:   Your chest pain is worse.  You have an increasing cough, or you cough up blood.  You have severe abdominal pain.  You have severe weakness.  You faint.  You have chills.  You have sudden, unexplained chest discomfort.  You have sudden, unexplained discomfort in your arms, back, neck, or jaw.  You have shortness of breath at any time.  You suddenly start to sweat, or your skin gets clammy.  You feel nauseous or you vomit.  You suddenly feel light-headed or dizzy.  Your heart begins to beat quickly, or it feels like it is skipping beats. These symptoms may represent a serious problem that is an emergency. Do not wait to see if the symptoms will go away. Get medical help right away. Call your local emergency services (911 in the [place] Do not drive yourself to the hospital.   This information is not intended to replace advice given to you by your health care provider. Make sure you discuss any questions you have with your health care provider.   Document Released: 03/22/2005 Document Revised: 07/03/2014 Document Reviewed:  01/16/2014 Elsevier Interactive Patient Education [employer]

## 2016-03-23 ENCOUNTER — Emergency Department (HOSPITAL_COMMUNITY): Payer: Medicare Other

## 2016-03-23 ENCOUNTER — Emergency Department (HOSPITAL_COMMUNITY)
Admission: EM | Admit: 2016-03-23 | Discharge: 2016-03-23 | Disposition: A | Discharge status: Home / Self Care | Payer: Medicare Other | Attending: Emergency Medicine | Admitting: Emergency Medicine

## 2016-03-23 DIAGNOSIS — T798XXA Other early complications of trauma, initial encounter: Secondary | ICD-10-CM

## 2016-03-23 DIAGNOSIS — I10 Essential (primary) hypertension: Secondary | ICD-10-CM | POA: Insufficient documentation

## 2016-03-23 DIAGNOSIS — Z955 Presence of coronary angioplasty implant and graft: Secondary | ICD-10-CM | POA: Diagnosis not present

## 2016-03-23 DIAGNOSIS — I2510 Atherosclerotic heart disease of native coronary artery without angina pectoris: Secondary | ICD-10-CM | POA: Insufficient documentation

## 2016-03-23 DIAGNOSIS — J449 Chronic obstructive pulmonary disease, unspecified: Secondary | ICD-10-CM | POA: Diagnosis not present

## 2016-03-23 DIAGNOSIS — L0889 Other specified local infections of the skin and subcutaneous tissue: Secondary | ICD-10-CM | POA: Diagnosis not present

## 2016-03-23 DIAGNOSIS — Z7982 Long term (current) use of aspirin: Secondary | ICD-10-CM | POA: Insufficient documentation

## 2016-03-23 DIAGNOSIS — F17200 Nicotine dependence, unspecified, uncomplicated: Secondary | ICD-10-CM | POA: Diagnosis not present

## 2016-03-23 MED ORDER — BACITRACIN ZINC 500 UNIT/GM EX OINT
1.0000 "application " | TOPICAL_OINTMENT | Freq: Once | CUTANEOUS | Status: AC
  Administered 2016-03-23: 1 via TOPICAL

## 2016-03-23 MED ORDER — BUPIVACAINE HCL 0.5 % IJ SOLN
50.0000 mL | Freq: Once | INTRAMUSCULAR | Status: AC
  Administered 2016-03-23: 1 mL
  Filled 2016-03-23: qty 50

## 2016-03-23 MED ORDER — CLINDAMYCIN HCL 150 MG PO CAPS: 450.0000 mg | ORAL_CAPSULE | Freq: Three times a day (TID) | ORAL | 0 refills | Status: DC

## 2016-03-23 MED ORDER — CLINDAMYCIN HCL 150 MG PO CAPS
450.0000 mg | ORAL_CAPSULE | Freq: Once | ORAL | Status: AC
  Administered 2016-03-23: 450 mg via ORAL
  Filled 2016-03-23: qty 3

## 2016-03-23 MED ORDER — HYDROCODONE-ACETAMINOPHEN 5-325 MG PO TABS: ORAL_TABLET | ORAL | 0 refills | Status: DC

## 2016-03-23 NOTE — ED Notes (Signed)
Pt here for right middle finger tip infection x 3 months. States he was bitten by a "wolf rat"10 years ago. Has had multiple episodes of infection in that finger. Pus noted under nail.

## 2016-03-23 NOTE — ED Notes (Signed)
Patient transported to X-ray 

## 2016-03-23 NOTE — ED Notes (Signed)
Pt d/c home  

## 2016-03-23 NOTE — Discharge Instructions (Signed)
Wash the affected area with soap and water and apply a thin layer of topical antibiotic ointment. Do this every 12 hours.  ° °Do not use rubbing alcohol or hydrogen peroxide.                       ° °Look for signs of infection: if you see redness, if the area becomes warm, if pain increases sharply, there is discharge (pus), if red streaks appear or you develop fever or vomiting, RETURN immediately to the Emergency Department  for a recheck.  ° °Please follow with your primary care doctor in the next 2 days for a check-up. They must obtain records for further management.  ° °Do not hesitate to return to the Emergency Department for any new, worsening or concerning symptoms.  ° °

## 2016-03-23 NOTE — ED Triage Notes (Signed)
Pt arrives via POV from home with right 3rd digit finger tip pain. Pt states finger tip was pulled back 4 months ago, has had difficulty healing. Appears to have pus under fingernail. Denies recent fever, VSS.

## 2016-03-23 NOTE — ED Provider Notes (Signed)
[facility]-EMERGENCY DEPT Provider Note   CSN: [id number] Arrival date & time: 03/23/16  1200   By signing my name below, I, Freida Busmaniana Omoyeni, attest that this documentation has been prepared under the direction and in the presence of non-physician practitioner, Wynetta EmeryNicole Alfonzia Woolum, PA-C. Electronically Signed: Freida Busmaniana Omoyeni, Scribe. 03/23/2016. 1:10 PM.  History   Chief Complaint Chief Complaint  Patient presents with  . Wound Infection    The history is provided by the patient. No language interpreter was used.    HPI Comments:  Ray Smith is a 70 y.o. male who presents to the Emergency Department complaining of burning pain to the right middle finger x 3 months. Pt notes the nail was curved over the finger and while working with his hands the nail snagged on something and turned backwards ~ 3 months ago and has been experiencing this same pain intermittently since. He also notes he was bitten by a wolf rat 10 years ago to the same finger and he has had issues with the finger since. He denies h/o DM and states he occasionally smokes cigars. States last tetanus shot was within last 5 years, pain is severe, not alleviated by over-the-counter pain medication denies fever, chills, immunosuppression.  No PCP  Past Medical History:  Diagnosis Date  . Coronary artery disease    a.  presented with CP and inf STE but no ACS - [facility] (10/23/13):  Dist LM 40-60%, ostial LAD 80%, mid LAD 50%, ostial CFX 50-70%, mid RCA 50%.  EF 60%. - CP not felt to be ischemic; CABG vs Med Rx d/w pt - pt opted for Med Rx  . Hyperlipidemia   . Hypertension     Patient Active Problem List   Diagnosis Date Noted  . Hypertension 11/12/2013  . Dyslipidemia 10/24/2013  . ACS (acute coronary syndrome) (HCC) 10/23/2013  . Chest pain 10/23/2013  . Coronary Artery Disease 10/23/2013  . COPD (chronic obstructive pulmonary disease) (HCC) 10/23/2013  . Tobacco abuse 10/23/2013  . Precordial pain 10/23/2013    Past Surgical  History:  Procedure Laterality Date  . LEFT HEART CATHETERIZATION WITH CORONARY ANGIOGRAM Bilateral 10/23/2013   Procedure: LEFT HEART CATHETERIZATION WITH CORONARY ANGIOGRAM;  Surgeon: Lesleigh NoeHenry W Smith III, MD;  [facility];  Service: Cardiovascular;  Laterality: Bilateral;  . snake bite surgery         Home Medications    Prior to Admission medications   Medication Sig Start Date End Date Taking? Authorizing Provider  amLODipine (NORVASC) 5 MG tablet Take 1 tablet (5 mg total) by mouth daily. 10/25/13   Rhonda G Barrett, PA-C  aspirin 325 MG EC tablet Take 1 tablet (325 mg total) by mouth daily. 10/25/13   Rhonda G Barrett, PA-C  clindamycin (CLEOCIN) 150 MG capsule Take 3 capsules (450 mg total) by mouth 3 (three) times daily. 03/23/16   Jaymz Traywick, PA-C  HYDROcodone-acetaminophen (NORCO/VICODIN) 5-325 MG tablet Take 1-2 tablets by mouth every 6 hours as needed for pain and/or cough. 03/23/16   Sereen Schaff, PA-C  lovastatin (MEVACOR) 20 MG tablet Take 1 tablet (20 mg total) by mouth at bedtime. 10/25/13   Rhonda G Barrett, PA-C  metoprolol succinate (TOPROL XL) 25 MG 24 hr tablet Take 1 tablet (25 mg total) by mouth daily. 10/25/13   Joline Salthonda G Barrett, PA-C  nitroGLYCERIN (NITROSTAT) 0.4 MG SL tablet Place under your tongue if you have severe chest pressure or tightness 10/25/13   Rhonda G Barrett, PA-C  oxyCODONE-acetaminophen (PERCOCET) 5-325 MG  per tablet Take 1-2 tablets by mouth every 6 (six) hours as needed for severe pain. 10/24/13   Abelino Derrick, PA-C  pantoprazole (PROTONIX) 40 MG tablet Take 1 tablet (40 mg total) by mouth daily. 04/21/15   Loren Racer, MD  sucralfate (CARAFATE) 1 G tablet Take 1 tablet (1 g total) by mouth 2 (two) times daily as needed (upper abdominal pain). 04/21/15   Loren Racer, MD  traMADol (ULTRAM) 50 MG tablet Take 1 tablet (50 mg total) by mouth every 6 (six) hours as needed. 04/21/15   Loren Racer, MD    Family History No family  history on file.  Social History Social History  Substance Use Topics  . Smoking status: Current Some Day Smoker  . Smokeless tobacco: Not on file  . Alcohol use No     Allergies   Flexeril [cyclobenzaprine]   Review of Systems Review of Systems  10 systems reviewed and all are negative for acute change except as noted in the HPI.   Physical Exam Updated Vital Signs BP 111/62   Pulse 75   Temp 98.3 F (36.8 C) (Oral)   Resp 16   SpO2 99%   Physical Exam  Constitutional: He is oriented to person, place, and time. He appears well-developed and well-nourished. No distress.  HENT:  Head: Normocephalic and atraumatic.  Eyes: Conjunctivae are normal.  Cardiovascular: Normal rate.   Pulmonary/Chest: Effort normal.  Abdominal: He exhibits no distension.  Neurological: He is alert and oriented to person, place, and time.  Skin: Skin is warm and dry.  Right middle digit with 5 mm ulceration and scant purulent drainage;  exquisitely tender to palpation Cap refill is brisk  Cannot probe to bone   Psychiatric: He has a normal mood and affect.  Nursing note and vitals reviewed.    ED Treatments / Results  DIAGNOSTIC STUDIES:  Oxygen Saturation is 100% on RA, normal by my interpretation.    COORDINATION OF CARE:  1:05 PM Discussed treatment plan with pt at bedside and pt agreed to plan.  Labs (all labs ordered are listed, but only abnormal results are displayed) Labs Reviewed - No data to display  EKG  EKG Interpretation None       Radiology Dg Finger Middle Right  Result Date: 03/23/2016 CLINICAL DATA:  Under healing wound after losing the nail 3 months ago, increasing pain EXAM: RIGHT MIDDLE FINGER 2+V COMPARISON:  None. FINDINGS: There is irregularity of the cortical margin of the tuft of the distal phalanx of the right third digit. These could be due to prior trauma and small bone fragments. Was there trauma initially? No acute fracture is seen and no focal  demineralization is noted. IMPRESSION: 1. Suspect prior trauma to the distal phalanx with small bone fragments most likely representing tiny fracture fragments. 2. No definite evidence of osteomyelitis currently. Electronically Signed   By: Dwyane Dee M.D.   On: 03/23/2016 14:44    Procedures Procedures (including critical care time)  NERVE BLOCK Performed by: Wynetta Emery, PA-C Consent: Verbal consent obtained. Consent given by: patient Time out: Immediately prior to procedure a "time out" was called to verify the correct patient, procedure, equipment, support staff and site/side marked as required. Indication: Pain relief  Nerve block body site: Right third digit  Preparation: Patient was prepped and draped in the usual sterile fashion. Needle gauge: 27-gauge Location technique: anatomical landmarks Local anesthetic: 1% lidocaine without epinephrine Anesthetic total: 5 mL Outcome:Pain relieved  Patient tolerance: Patient  tolerated the procedure well with no immediate complications.   Medications Ordered in ED Medications  bupivacaine (MARCAINE) 0.5 % (with pres) injection 50 mL (1 mL Infiltration Given 03/23/16 1323)  clindamycin (CLEOCIN) capsule 450 mg (450 mg Oral Given 03/23/16 1427)  bacitracin ointment 1 application (1 application Topical Given 03/23/16 1518)     Initial Impression / Assessment and Plan / ED Course  I have reviewed the triage vital signs and the nursing notes.  Pertinent labs & imaging results that were available during my care of the patient were reviewed by me and considered in my medical decision making (see chart for details).  Clinical Course    Vitals:   03/23/16 1214 03/23/16 1518  BP: 101/79 111/62  Pulse: 90 75  Resp:  16  Temp: 98.3 F (36.8 C)   TempSrc: Oral   SpO2: 100% 99%    Medications  bupivacaine (MARCAINE) 0.5 % (with pres) injection 50 mL (1 mL Infiltration Given 03/23/16 1323)  clindamycin (CLEOCIN) capsule 450 mg (450  mg Oral Given 03/23/16 1427)  bacitracin ointment 1 application (1 application Topical Given 03/23/16 1518)    Ako Brunick is 70 y.o. male presenting with infected abrasion to distal fingertip. Nerve block applied with good relief of this patient's pain. No signs of osteomyelitis on the x-ray, afebrile and overall nontoxic appearing. Counseled patient on wound care and will start on clindamycin.  Evaluation does not show pathology that would require ongoing emergent intervention or inpatient treatment. Pt is hemodynamically stable and mentating appropriately. Discussed findings and plan with patient/guardian, who agrees with care plan. All questions answered. Return precautions discussed and outpatient follow up given.      Final Clinical Impressions(s) / ED Diagnoses   Final diagnoses:  Infected wound, initial encounter    New Prescriptions Discharge Medication List as of 03/23/2016  3:07 PM    START taking these medications   Details  clindamycin (CLEOCIN) 150 MG capsule Take 3 capsules (450 mg total) by mouth 3 (three) times daily., Starting Thu 03/23/2016, Print    HYDROcodone-acetaminophen (NORCO/VICODIN) 5-325 MG tablet Take 1-2 tablets by mouth every 6 hours as needed for pain and/or cough., Print       I personally performed the services described in this documentation, which was scribed in my presence. The recorded information has been reviewed and is accurate.     Wynetta Emery, PA-C 03/23/16 1733    Lyndal Pulley, MD 03/24/16 [phone]

## 2017-10-05 ENCOUNTER — Encounter (HOSPITAL_COMMUNITY): Payer: Self-pay | Admitting: Emergency Medicine

## 2017-10-05 ENCOUNTER — Inpatient Hospital Stay (HOSPITAL_COMMUNITY)
Admission: EM | Admit: 2017-10-05 | Discharge: 2017-10-08 | DRG: 071 | Disposition: A | Discharge status: Home / Self Care | Payer: Medicare Other | Attending: Internal Medicine | Admitting: Internal Medicine

## 2017-10-05 ENCOUNTER — Emergency Department (HOSPITAL_COMMUNITY): Payer: Medicare Other

## 2017-10-05 DIAGNOSIS — Z886 Allergy status to analgesic agent status: Secondary | ICD-10-CM

## 2017-10-05 DIAGNOSIS — I739 Peripheral vascular disease, unspecified: Secondary | ICD-10-CM | POA: Diagnosis present

## 2017-10-05 DIAGNOSIS — R7989 Other specified abnormal findings of blood chemistry: Secondary | ICD-10-CM | POA: Diagnosis present

## 2017-10-05 DIAGNOSIS — M1990 Unspecified osteoarthritis, unspecified site: Secondary | ICD-10-CM | POA: Diagnosis present

## 2017-10-05 DIAGNOSIS — I129 Hypertensive chronic kidney disease with stage 1 through stage 4 chronic kidney disease, or unspecified chronic kidney disease: Secondary | ICD-10-CM | POA: Diagnosis present

## 2017-10-05 DIAGNOSIS — E739 Lactose intolerance, unspecified: Secondary | ICD-10-CM | POA: Diagnosis present

## 2017-10-05 DIAGNOSIS — Z9861 Coronary angioplasty status: Secondary | ICD-10-CM

## 2017-10-05 DIAGNOSIS — Z888 Allergy status to other drugs, medicaments and biological substances status: Secondary | ICD-10-CM

## 2017-10-05 DIAGNOSIS — J449 Chronic obstructive pulmonary disease, unspecified: Secondary | ICD-10-CM | POA: Diagnosis present

## 2017-10-05 DIAGNOSIS — K219 Gastro-esophageal reflux disease without esophagitis: Secondary | ICD-10-CM | POA: Diagnosis present

## 2017-10-05 DIAGNOSIS — N189 Chronic kidney disease, unspecified: Secondary | ICD-10-CM | POA: Diagnosis present

## 2017-10-05 DIAGNOSIS — N179 Acute kidney failure, unspecified: Secondary | ICD-10-CM | POA: Diagnosis not present

## 2017-10-05 DIAGNOSIS — R4182 Altered mental status, unspecified: Secondary | ICD-10-CM

## 2017-10-05 DIAGNOSIS — G9341 Metabolic encephalopathy: Secondary | ICD-10-CM | POA: Diagnosis not present

## 2017-10-05 DIAGNOSIS — Z885 Allergy status to narcotic agent status: Secondary | ICD-10-CM

## 2017-10-05 DIAGNOSIS — T407X1A Poisoning by cannabis (derivatives), accidental (unintentional), initial encounter: Secondary | ICD-10-CM | POA: Diagnosis present

## 2017-10-05 DIAGNOSIS — G9340 Encephalopathy, unspecified: Secondary | ICD-10-CM | POA: Diagnosis not present

## 2017-10-05 DIAGNOSIS — F17200 Nicotine dependence, unspecified, uncomplicated: Secondary | ICD-10-CM | POA: Diagnosis present

## 2017-10-05 DIAGNOSIS — A419 Sepsis, unspecified organism: Secondary | ICD-10-CM

## 2017-10-05 DIAGNOSIS — E870 Hyperosmolality and hypernatremia: Secondary | ICD-10-CM | POA: Diagnosis present

## 2017-10-05 DIAGNOSIS — B957 Other staphylococcus as the cause of diseases classified elsewhere: Secondary | ICD-10-CM | POA: Diagnosis present

## 2017-10-05 DIAGNOSIS — G8929 Other chronic pain: Secondary | ICD-10-CM | POA: Diagnosis present

## 2017-10-05 DIAGNOSIS — T40601A Poisoning by unspecified narcotics, accidental (unintentional), initial encounter: Secondary | ICD-10-CM | POA: Diagnosis present

## 2017-10-05 DIAGNOSIS — I10 Essential (primary) hypertension: Secondary | ICD-10-CM | POA: Diagnosis present

## 2017-10-05 DIAGNOSIS — I2510 Atherosclerotic heart disease of native coronary artery without angina pectoris: Secondary | ICD-10-CM | POA: Diagnosis present

## 2017-10-05 DIAGNOSIS — Z7982 Long term (current) use of aspirin: Secondary | ICD-10-CM

## 2017-10-05 DIAGNOSIS — Z781 Physical restraint status: Secondary | ICD-10-CM

## 2017-10-05 DIAGNOSIS — Z79899 Other long term (current) drug therapy: Secondary | ICD-10-CM

## 2017-10-05 DIAGNOSIS — E785 Hyperlipidemia, unspecified: Secondary | ICD-10-CM | POA: Diagnosis present

## 2017-10-05 DIAGNOSIS — R000 Tachycardia, unspecified: Secondary | ICD-10-CM | POA: Diagnosis present

## 2017-10-05 DIAGNOSIS — M79644 Pain in right finger(s): Secondary | ICD-10-CM

## 2017-10-05 DIAGNOSIS — N390 Urinary tract infection, site not specified: Secondary | ICD-10-CM

## 2017-10-05 LAB — COMPREHENSIVE METABOLIC PANEL
ALBUMIN: 3.8 g/dL (ref 3.5–5.0)
ALT: 13 U/L — AB (ref 17–63)
AST: 21 U/L (ref 15–41)
Alkaline Phosphatase: 111 U/L (ref 38–126)
Anion gap: 14 (ref 5–15)
BUN: 23 mg/dL — AB (ref 6–20)
CHLORIDE: 113 mmol/L — AB (ref 101–111)
CO2: 20 mmol/L — ABNORMAL LOW (ref 22–32)
CREATININE: 1.38 mg/dL — AB (ref 0.61–1.24)
Calcium: 9.3 mg/dL (ref 8.9–10.3)
GFR calc Af Amer: 57 mL/min — ABNORMAL LOW (ref >60)
GFR, EST NON AFRICAN AMERICAN: 49 mL/min — AB (ref >60)
GLUCOSE: 134 mg/dL — AB (ref 65–99)
Potassium: 3.2 mmol/L — ABNORMAL LOW (ref 3.5–5.1)
Sodium: 147 mmol/L — ABNORMAL HIGH (ref 135–145)
TOTAL PROTEIN: 6.8 g/dL (ref 6.5–8.1)
Total Bilirubin: 0.8 mg/dL (ref 0.3–1.2)

## 2017-10-05 LAB — CBC WITH DIFFERENTIAL/PLATELET
Basophils Absolute: 0 10*3/uL (ref 0.0–0.1)
Basophils Relative: 0 %
EOS ABS: 0 10*3/uL (ref 0.0–0.7)
EOS PCT: 0 %
HCT: 44.7 % (ref 39.0–52.0)
Hemoglobin: 14.3 g/dL (ref 13.0–17.0)
LYMPHS ABS: 0.4 10*3/uL — AB (ref 0.7–4.0)
LYMPHS PCT: 3 %
MCH: 29.1 pg (ref 26.0–34.0)
MCHC: 32 g/dL (ref 30.0–36.0)
MCV: 90.9 fL (ref 78.0–100.0)
MONO ABS: 0.4 10*3/uL (ref 0.1–1.0)
Monocytes Relative: 4 %
Neutro Abs: 11.1 10*3/uL — ABNORMAL HIGH (ref 1.7–7.7)
Neutrophils Relative %: 93 %
PLATELETS: 345 10*3/uL (ref 150–400)
RBC: 4.92 MIL/uL (ref 4.22–5.81)
RDW: 16 % — AB (ref 11.5–15.5)
WBC: 11.9 10*3/uL — AB (ref 4.0–10.5)

## 2017-10-05 LAB — I-STAT VENOUS BLOOD GAS, ED
Acid-base deficit: 4 mmol/L — ABNORMAL HIGH (ref 0.0–2.0)
BICARBONATE: 23.2 mmol/L (ref 20.0–28.0)
O2 Saturation: 27 %
PCO2 VEN: 49.7 mmHg (ref 44.0–60.0)
TCO2: 25 mmol/L (ref 22–32)
pH, Ven: 7.277 (ref 7.250–7.430)
pO2, Ven: 21 mmHg — CL (ref 32.0–45.0)

## 2017-10-05 LAB — ETHANOL: Alcohol, Ethyl (B): <10 mg/dL (ref <10)

## 2017-10-05 LAB — URINALYSIS, ROUTINE W REFLEX MICROSCOPIC
BACTERIA UA: NONE SEEN
Bilirubin Urine: NEGATIVE
Glucose, UA: NEGATIVE mg/dL
Ketones, ur: 5 mg/dL — AB
NITRITE: POSITIVE — AB
PROTEIN: 30 mg/dL — AB
SPECIFIC GRAVITY, URINE: 1.015 (ref 1.005–1.030)
pH: 6 (ref 5.0–8.0)

## 2017-10-05 LAB — RAPID URINE DRUG SCREEN, HOSP PERFORMED
AMPHETAMINES: NOT DETECTED
Barbiturates: NOT DETECTED
Benzodiazepines: POSITIVE — AB
Cocaine: NOT DETECTED
OPIATES: POSITIVE — AB
Tetrahydrocannabinol: POSITIVE — AB

## 2017-10-05 LAB — AMMONIA: Ammonia: 17 umol/L (ref 9–35)

## 2017-10-05 LAB — ACETAMINOPHEN LEVEL: Acetaminophen (Tylenol), Serum: <10 ug/mL — ABNORMAL LOW (ref 10–30)

## 2017-10-05 LAB — I-STAT CG4 LACTIC ACID, ED: LACTIC ACID, VENOUS: 2.85 mmol/L — AB (ref 0.5–1.9)

## 2017-10-05 LAB — SALICYLATE LEVEL

## 2017-10-05 LAB — TROPONIN I: TROPONIN I: 0.03 ng/mL — AB (ref <0.03)

## 2017-10-05 MED ORDER — ASPIRIN 81 MG PO CHEW: 324.0000 mg | CHEWABLE_TABLET | Freq: Once | ORAL | Status: DC

## 2017-10-05 MED ORDER — SODIUM CHLORIDE 0.9 % IV BOLUS
1000.0000 mL | Freq: Once | INTRAVENOUS | Status: AC
  Administered 2017-10-06: 1000 mL via INTRAVENOUS

## 2017-10-05 MED ORDER — HALOPERIDOL LACTATE 5 MG/ML IJ SOLN
2.0000 mg | Freq: Once | INTRAMUSCULAR | Status: AC
  Administered 2017-10-05: 2 mg via INTRAMUSCULAR
  Filled 2017-10-05: qty 1

## 2017-10-05 MED ORDER — SODIUM CHLORIDE 0.9 % IV SOLN
1.0000 g | Freq: Once | INTRAVENOUS | Status: AC
  Administered 2017-10-06: 1 g via INTRAVENOUS
  Filled 2017-10-05: qty 10

## 2017-10-05 MED ORDER — NITROGLYCERIN 0.4 MG SL SUBL: 0.4000 mg | SUBLINGUAL_TABLET | SUBLINGUAL | Status: DC | PRN

## 2017-10-05 MED ORDER — LACTATED RINGERS IV BOLUS
1000.0000 mL | Freq: Once | INTRAVENOUS | Status: AC
  Administered 2017-10-05: 1000 mL via INTRAVENOUS

## 2017-10-05 NOTE — ED Notes (Signed)
22 5mg  percocet, 25 5mg   percecot and 5 7.5mg  hydrocodone - 60tabs of narcotics given to patient in last two weeks. All pills are missing. MD notified, see new orders  Taking aleve as well for pain management

## 2017-10-05 NOTE — ED Notes (Signed)
To CT at this time.

## 2017-10-05 NOTE — ED Triage Notes (Signed)
LSW 1900, pt arrives snoring respirations, pinpoint pupils, difficult to arouse. Was combative, SHOb, tripod positioning. EMS gave 5IM versed for combative behaviors. From home. Takes a lot of CBD per family.

## 2017-10-05 NOTE — ED Provider Notes (Addendum)
[facility] EMERGENCY DEPARTMENT Provider Note   CSN: [id number] Arrival date & time: 10/05/17  1957     History   Chief Complaint Chief Complaint  Patient presents with  . Altered Mental Status    HPI Ray Smith is a 72 y.o. male.   LEVEL 5 caveat due to altered mental status. History obtained by EMS.    The history is provided by the EMS personnel.   Altered Mental Status    This is a new problem. The current episode started less than 1 hour ago. The problem has not changed since onset.Associated symptoms include somnolence and agitation. Risk factors: has opiod rx and took CBD pills prior to EMS arrival. EMS gave 5 mg of IM versed due to agitation and patient now somnolent and unable to give history.   His past medical history is significant for COPD.    Past Medical History:  Diagnosis Date  . Coronary artery disease    a.  presented with CP and inf STE but no ACS - LHC (10/23/13):  Dist LM 40-60%, ostial LAD 80%, mid LAD 50%, ostial CFX 50-70%, mid RCA 50%.  EF 60%. - CP not felt to be ischemic; CABG vs Med Rx d/w pt - pt opted for Med Rx  . Hyperlipidemia   . Hypertension     Patient Active Problem List   Diagnosis Date Noted  . Hypertension 11/12/2013  . Dyslipidemia 10/24/2013  . ACS (acute coronary syndrome) (HCC) 10/23/2013  . Chest pain 10/23/2013  . Coronary Artery Disease 10/23/2013  . COPD (chronic obstructive pulmonary disease) (HCC) 10/23/2013  . Tobacco abuse 10/23/2013  . Precordial pain 10/23/2013    Past Surgical History:  Procedure Laterality Date  . LEFT HEART CATHETERIZATION WITH CORONARY ANGIOGRAM Bilateral 10/23/2013   Procedure: LEFT HEART CATHETERIZATION WITH CORONARY ANGIOGRAM;  Surgeon: Lesleigh Noe, MD;  Location: [facility] CATH LAB;  Service: Cardiovascular;  Laterality: Bilateral;  . snake bite surgery          Home Medications    Prior to Admission medications   Medication Sig Start Date End Date Taking?  Authorizing Provider  amLODipine (NORVASC) 5 MG tablet Take 1 tablet (5 mg total) by mouth daily. 10/25/13   Barrett, Joline Salt, PA-C  aspirin 325 MG EC tablet Take 1 tablet (325 mg total) by mouth daily. 10/25/13   Barrett, Joline Salt, PA-C  clindamycin (CLEOCIN) 150 MG capsule Take 3 capsules (450 mg total) by mouth 3 (three) times daily. 03/23/16   Pisciotta, Joni Reining, PA-C  HYDROcodone-acetaminophen (NORCO/VICODIN) 5-325 MG tablet Take 1-2 tablets by mouth every 6 hours as needed for pain and/or cough. 03/23/16   Pisciotta, Joni Reining, PA-C  lovastatin (MEVACOR) 20 MG tablet Take 1 tablet (20 mg total) by mouth at bedtime. 10/25/13   Barrett, Joline Salt, PA-C  metoprolol succinate (TOPROL XL) 25 MG 24 hr tablet Take 1 tablet (25 mg total) by mouth daily. 10/25/13   Barrett, Joline Salt, PA-C  nitroGLYCERIN (NITROSTAT) 0.4 MG SL tablet Place under your tongue if you have severe chest pressure or tightness 10/25/13   Barrett, Joline Salt, PA-C  oxyCODONE-acetaminophen (PERCOCET) 5-325 MG per tablet Take 1-2 tablets by mouth every 6 (six) hours as needed for severe pain. 10/24/13   Abelino Derrick, PA-C  pantoprazole (PROTONIX) 40 MG tablet Take 1 tablet (40 mg total) by mouth daily. 04/21/15   Loren Racer, MD  sucralfate (CARAFATE) 1 G tablet Take 1 tablet (1 g total) by  mouth 2 (two) times daily as needed (upper abdominal pain). 04/21/15   Loren Racer, MD  traMADol (ULTRAM) 50 MG tablet Take 1 tablet (50 mg total) by mouth every 6 (six) hours as needed. 04/21/15   Loren Racer, MD    Family History No family history on file.  Social History Social History   Tobacco Use  . Smoking status: Current Some Day Smoker  Substance Use Topics  . Alcohol use: No  . Drug use: No     Allergies   Flexeril [cyclobenzaprine]   Review of Systems Review of Systems  Unable to perform ROS: Acuity of condition  Psychiatric/Behavioral: Positive for agitation.     Physical Exam Updated Vital Signs  ED Triage  Vitals  Enc Vitals Group     BP 10/05/17 1958 (!) 143/92     Pulse Rate 10/05/17 1958 (!) 103     Resp 10/05/17 1958 (!) 24     Temp 10/05/17 1958 98.3 F (36.8 C)     Temp Source 10/05/17 1958 Oral     SpO2 10/05/17 1958 100 %     Weight 10/05/17 2022 122 lb 14.4 oz (55.7 kg)     Height --      Head Circumference --      Peak Flow --      Pain Score 10/05/17 2000 0     Pain Loc --      Pain Edu? --      Excl. in GC? --     Physical Exam  Constitutional: He appears distressed.  HENT:  Head: Normocephalic and atraumatic.  Mouth/Throat: No oropharyngeal exudate.  Eyes: Pupils are equal, round, and reactive to light. Conjunctivae and EOM are normal.  Neck: Normal range of motion. Neck supple.  Cardiovascular: Normal rate, regular rhythm, normal heart sounds and intact distal pulses.  No murmur heard. Pulmonary/Chest: He has no wheezes. He has no rales.  Depressed breathing  Abdominal: Soft. He exhibits no distension.  Musculoskeletal: Normal range of motion. He exhibits no edema.  Neurological: He is alert.  Withdraws to pain, moves all extremities, mumbles  Skin: Skin is warm and dry. Capillary refill takes less than 2 seconds.     ED Treatments / Results  Labs (all labs ordered are listed, but only abnormal results are displayed) Labs Reviewed  COMPREHENSIVE METABOLIC PANEL - Abnormal; Notable for the following components:      Result Value   Sodium 147 (*)    Potassium 3.2 (*)    Chloride 113 (*)    CO2 20 (*)    Glucose, Bld 134 (*)    BUN 23 (*)    Creatinine, Ser 1.38 (*)    ALT 13 (*)    GFR calc non Af Amer 49 (*)    GFR calc Af Amer 57 (*)    All other components within normal limits  CBC WITH DIFFERENTIAL/PLATELET - Abnormal; Notable for the following components:   WBC 11.9 (*)    RDW 16.0 (*)    Neutro Abs 11.1 (*)    Lymphs Abs 0.4 (*)    All other components within normal limits  TROPONIN I - Abnormal; Notable for the following components:    Troponin I 0.03 (*)    All other components within normal limits  URINALYSIS, ROUTINE W REFLEX MICROSCOPIC - Abnormal; Notable for the following components:   APPearance HAZY (*)    Hgb urine dipstick SMALL (*)    Ketones, ur 5 (*)  Protein, ur 30 (*)    Nitrite POSITIVE (*)    Leukocytes, UA MODERATE (*)    Squamous Epithelial / LPF 0-5 (*)    All other components within normal limits  RAPID URINE DRUG SCREEN, HOSP PERFORMED - Abnormal; Notable for the following components:   Opiates POSITIVE (*)    Benzodiazepines POSITIVE (*)    Tetrahydrocannabinol POSITIVE (*)    All other components within normal limits  ACETAMINOPHEN LEVEL - Abnormal; Notable for the following components:   Acetaminophen (Tylenol), Serum <10 (*)    All other components within normal limits  I-STAT CG4 LACTIC ACID, ED - Abnormal; Notable for the following components:   Lactic Acid, Venous 2.85 (*)    All other components within normal limits  I-STAT VENOUS BLOOD GAS, ED - Abnormal; Notable for the following components:   pO2, Ven 21.0 (*)    Acid-base deficit 4.0 (*)    All other components within normal limits  URINE CULTURE  CULTURE, BLOOD (ROUTINE X 2)  CULTURE, BLOOD (ROUTINE X 2)  AMMONIA  ETHANOL  SALICYLATE LEVEL  BLOOD GAS, VENOUS  MAGNESIUM  PHOSPHORUS  TROPONIN I  LACTIC ACID, PLASMA  LACTIC ACID, PLASMA    EKG EKG Interpretation  Date/Time:  Friday October 05 2017 19:58:55 EDT Ventricular Rate:  98 PR Interval:    QRS Duration: 87 QT Interval:  362 QTC Calculation: 463 R Axis:   21 Text Interpretation:  Sinus rhythm Non-specific ST-t changes Confirmed by Cathren Laine ([id number]) on 10/05/2017 8:31:54 PM   Radiology Ct Head Wo Contrast  Result Date: 10/05/2017 CLINICAL DATA:  Altered mental status and lethargy. EXAM: CT HEAD WITHOUT CONTRAST TECHNIQUE: Contiguous axial images were obtained from the base of the skull through the vertex without intravenous contrast. COMPARISON:   10/11/2009 FINDINGS: BRAIN: There is sulcal and ventricular prominence consistent with superficial and central atrophy. No intraparenchymal hemorrhage, mass effect nor midline shift. Periventricular and subcortical white matter hypodensities consistent with chronic small vessel ischemic disease are identified. No acute large vascular territory infarcts. No abnormal extra-axial fluid collections. Basal cisterns are not effaced and midline. VASCULAR: 10/11/2009 SKULL: No skull fracture. No significant scalp soft tissue swelling. SINUSES/ORBITS: The mastoid air-cells are clear. The included paranasal sinuses are well-aerated.The included ocular globes and orbital contents are non-suspicious. OTHER: None. IMPRESSION: Diffuse small vessel ischemic disease, chronic in appearance. Atrophy. No acute intracranial abnormality. Electronically Signed   By: Tollie Eth M.D.   On: 10/05/2017 22:09   Dg Chest Port 1 View  Result Date: 10/05/2017 CLINICAL DATA:  Short of breath EXAM: PORTABLE CHEST 1 VIEW COMPARISON:  04/20/2015 FINDINGS: Decreased lung volumes. Elevation of the left diaphragm. No acute airspace disease or effusion. Stable cardiomediastinal silhouette with aortic atherosclerosis. No pneumothorax. IMPRESSION: No active disease. Electronically Signed   By: Jasmine Pang M.D.   On: 10/05/2017 20:22    Procedures Procedures (including critical care time)  Medications Ordered in ED Medications  sodium chloride 0.9 % bolus 1,000 mL (has no administration in time range)  cefTRIAXone (ROCEPHIN) 1 g in sodium chloride 0.9 % 100 mL IVPB (has no administration in time range)  haloperidol lactate (HALDOL) injection 2 mg (has no administration in time range)  aspirin chewable tablet 324 mg (has no administration in time range)  nitroGLYCERIN (NITROSTAT) SL tablet 0.4 mg (has no administration in time range)  lactated ringers bolus 1,000 mL (0 mLs Intravenous Paused 10/05/17 2311)     Initial Impression /  Assessment and Plan /  ED Course  I have reviewed the triage vital signs and the nursing notes.  Pertinent labs & imaging results that were available during my care of the patient were reviewed by me and considered in my medical decision making (see chart for details).     JETER TOMEY is a 72 year old with history of CAD, hypertension, high cholesterol who presents to the ED with altered mental status with EMS.  Patient according to EMS was not doing well at home and may have possibly taken too much of his opioids and also has been taking some CBD pills.  Patient with 5 mg of IM Versed given by EMS prior to arrival due to agitation.  Patient according the family members who arrived after initial evaluation states that patient recently had a kidney infection. He has peripheral vascular disease and is supposed to undergo bypass surgery or vascular surgery at some point.  They noticed that there was a fair amount of his narcotic pain medicine possibly missing and son states that he has been chewing through them.  He states that the patient has not been feeling well and had some nausea and vomiting.  Patient on arrival is extremely somnolent but withdraws to pain.  Patient breathing on his own, however, on nasal cannula.  Suspect possibly from IM Versed that was given by EMS.  He moves all his extremities.  He is clear breath sounds on exam.  Given altered mental status will obtain infectious workup with blood cultures, urinalysis, EKG, troponin, chest x-ray, head CT.  Also will send out alcohol level, salicylate, ammonia, Tylenol level. Hx of CAD/PVD.  Patient with EKG that shows sinus rhythm with no obvious ischemic changes.  There is some ST depressions in the lateral lead but otherwise EKG appears similar to prior.  Troponin 0.03, possibly from demand given apparent hypoxia noticed by EMS, will trend troponins.  Patient with elevated lactic acid, mild leukocytosis, signs of urinary tract infection and  given IM Rocephin as concerned that part of patient's issues are secondary to urinary tract infection. Concern for sepsis but HDS.  Patient had head CT that was unremarkable.  Otherwise no significant electrolyte abnormality except mild AKI.  Negative Tylenol, salicylate.  Chest x-ray with no signs of pneumonia, pneumothorax, pleural effusion.  On re-evaluation patient is more alert but easily gets angry.  Vitals are normal.  Patient with multifactorial altered mental status but improving likely secondary to polypharmacy but also likely from urinary tract infection.  Patient to be admitted to medicine for further care.  Will trend troponins at this time but patient refuses to give history about whether or not he has chest pain or not.   Final Clinical Impressions(s) / ED Diagnoses   Final diagnoses:  Urinary tract infection without hematuria, site unspecified  AKI (acute kidney injury) (HCC)  Sepsis, due to unspecified organism [facility])  Altered mental status, unspecified altered mental status type    ED Discharge Orders    None      Virgina Norfolk, DO 10/05/17 2356  Virgina Norfolk, DO 10/05/17 2357  Cathren Laine, MD 10/06/17 2020  Cathren Laine, MD 11/23/17 1621

## 2017-10-05 NOTE — ED Notes (Signed)
Date and time results received: 10/05/17 9:13 PM (use smartphrase ".now" to insert current time)  Test: Troponin Critical Value: 0.03  Name of Provider Notified: Dr. Denton Lank  Orders Received? Or Actions Taken?: no new orders

## 2017-10-05 NOTE — ED Notes (Signed)
ED Provider at bedside. 

## 2017-10-05 NOTE — ED Notes (Signed)
Repaged Admitting
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
## 2017-10-05 NOTE — H&P (Signed)
[facility] History and Physical  Ray Smith ZOX:[id number] DOB: 25-May-1946 DOA: 10/05/2017  Referring physician:  PCP: Patient, No Pcp Per   Chief Complaint:   HPI: Ray Smith is a 72 y.o. male with past medical history significant coronary artery disease, prevesical vascular disease, chronic pain with narcotic use, hyperlipidemia hypertension presents emergency room with confusion and agitation.  Patient is on narcotics recently start taking CBD oil.  Patient daughter says he also uses marijuana off the street occasionally.  Normally very calm and respectful.  His agitated behavior is out of the ordinary for him.  Due to agitation patient was given Versed by EMS.  ED course: CT head negative.  Due to agitation UDS positive for benzos, opioids and THC.  In the emergency room patient continued to be agitated was given Haldol.  Hospitlaitst consulted for admission.   Review of Systems:  He is able to give review of systems due to altered mental status and agitation.    Past Medical History:  Diagnosis Date  . Coronary artery disease    a.  presented with CP and inf STE but no ACS - LHC (10/23/13):  Dist LM 40-60%, ostial LAD 80%, mid LAD 50%, ostial CFX 50-70%, mid RCA 50%.  EF 60%. - CP not felt to be ischemic; CABG vs Med Rx d/w pt - pt opted for Med Rx  . Hyperlipidemia   . Hypertension    Past Surgical History:  Procedure Laterality Date  . LEFT HEART CATHETERIZATION WITH CORONARY ANGIOGRAM Bilateral 10/23/2013   Procedure: LEFT HEART CATHETERIZATION WITH CORONARY ANGIOGRAM;  Surgeon: Lesleigh Noe, MD;  Location: [facility] CATH LAB;  Service: Cardiovascular;  Laterality: Bilateral;  . snake bite surgery     Social History:  reports that he has been smoking.  He does not have any smokeless tobacco history on file. He reports that he does not drink alcohol or use drugs.  Allergies  Allergen Reactions  . Flexeril [Cyclobenzaprine] Other (See Comments)    Per patient "it  made my heart stop"    No family history on file.   Prior to Admission medications   Medication Sig Start Date End Date Taking? Authorizing Provider  amLODipine (NORVASC) 5 MG tablet Take 1 tablet (5 mg total) by mouth daily. 10/25/13   Barrett, Joline Salt, PA-C  aspirin 325 MG EC tablet Take 1 tablet (325 mg total) by mouth daily. 10/25/13   Barrett, Joline Salt, PA-C  clindamycin (CLEOCIN) 150 MG capsule Take 3 capsules (450 mg total) by mouth 3 (three) times daily. 03/23/16   Pisciotta, Joni Reining, PA-C  HYDROcodone-acetaminophen (NORCO/VICODIN) 5-325 MG tablet Take 1-2 tablets by mouth every 6 hours as needed for pain and/or cough. 03/23/16   Pisciotta, Joni Reining, PA-C  lovastatin (MEVACOR) 20 MG tablet Take 1 tablet (20 mg total) by mouth at bedtime. 10/25/13   Barrett, Joline Salt, PA-C  metoprolol succinate (TOPROL XL) 25 MG 24 hr tablet Take 1 tablet (25 mg total) by mouth daily. 10/25/13   Barrett, Joline Salt, PA-C  nitroGLYCERIN (NITROSTAT) 0.4 MG SL tablet Place under your tongue if you have severe chest pressure or tightness 10/25/13   Barrett, Joline Salt, PA-C  oxyCODONE-acetaminophen (PERCOCET) 5-325 MG per tablet Take 1-2 tablets by mouth every 6 (six) hours as needed for severe pain. 10/24/13   Abelino Derrick, PA-C  pantoprazole (PROTONIX) 40 MG tablet Take 1 tablet (40 mg total) by mouth daily. 04/21/15   Loren Racer, MD  sucralfate (CARAFATE)  1 G tablet Take 1 tablet (1 g total) by mouth 2 (two) times daily as needed (upper abdominal pain). 04/21/15   Loren Racer, MD  traMADol (ULTRAM) 50 MG tablet Take 1 tablet (50 mg total) by mouth every 6 (six) hours as needed. 04/21/15   Loren Racer, MD   Physical Exam: Vitals:   10/05/17 1958 10/05/17 2022 10/05/17 2245 10/05/17 2300  BP: (!) 143/92  (!) 144/89 (!) 140/98  Pulse: (!) 103   (!) 111  Resp: (!) 24  19 (!) 22  Temp: 98.3 F (36.8 C)   99.1 F (37.3 C)  TempSrc: Oral   Axillary  SpO2: 100%   98%  Weight:  55.7 kg (122 lb 14.4 oz)       Wt Readings from Last 3 Encounters:  10/05/17 55.7 kg (122 lb 14.4 oz)  10/25/13 58.4 kg (128 lb 12.8 oz)    General:  Appears calm and comfortable;a&Ox3 Eyes:  PERRL, EOMI, normal lids, iris ENT:  grossly normal hearing, lips & tongue Neck:  no LAD, masses or thyromegaly Cardiovascular:  RRR, no m/r/g. No LE edema.  Respiratory:  CTA bilaterally, no w/r/r. Normal respiratory effort. Abdomen:  soft, ntnd Skin:  no rash or induration seen on limited exam Musculoskeletal:  grossly normal tone BUE/BLE Psychiatric:  grossly normal mood and affect, speech fluent and appropriate Neurologic:  CN 2-12 grossly intact, moves all extremities in coordinated fashion.          Labs on Admission:  Basic Metabolic Panel: Recent Labs  Lab 10/05/17 2012  NA 147*  K 3.2*  CL 113*  CO2 20*  GLUCOSE 134*  BUN 23*  CREATININE 1.38*  CALCIUM 9.3   Liver Function Tests: Recent Labs  Lab 10/05/17 2012  AST 21  ALT 13*  ALKPHOS 111  BILITOT 0.8  PROT 6.8  ALBUMIN 3.8   No results for input(s): LIPASE, AMYLASE in the last 168 hours. Recent Labs  Lab 10/05/17 2012  AMMONIA 17   CBC: Recent Labs  Lab 10/05/17 2012  WBC 11.9*  NEUTROABS 11.1*  HGB 14.3  HCT 44.7  MCV 90.9  PLT 345   Cardiac Enzymes: Recent Labs  Lab 10/05/17 2012  TROPONINI 0.03*    BNP (last 3 results) No results for input(s): BNP in the last 8760 hours.  ProBNP (last 3 results) No results for input(s): PROBNP in the last 8760 hours.   Creatinine clearance cannot be calculated (Unknown ideal weight.)  CBG: No results for input(s): GLUCAP in the last 168 hours.  Radiological Exams on Admission: Ct Head Wo Contrast  Result Date: 10/05/2017 CLINICAL DATA:  Altered mental status and lethargy. EXAM: CT HEAD WITHOUT CONTRAST TECHNIQUE: Contiguous axial images were obtained from the base of the skull through the vertex without intravenous contrast. COMPARISON:  10/11/2009 FINDINGS: BRAIN: There is  sulcal and ventricular prominence consistent with superficial and central atrophy. No intraparenchymal hemorrhage, mass effect nor midline shift. Periventricular and subcortical white matter hypodensities consistent with chronic small vessel ischemic disease are identified. No acute large vascular territory infarcts. No abnormal extra-axial fluid collections. Basal cisterns are not effaced and midline. VASCULAR: 10/11/2009 SKULL: No skull fracture. No significant scalp soft tissue swelling. SINUSES/ORBITS: The mastoid air-cells are clear. The included paranasal sinuses are well-aerated.The included ocular globes and orbital contents are non-suspicious. OTHER: None. IMPRESSION: Diffuse small vessel ischemic disease, chronic in appearance. Atrophy. No acute intracranial abnormality. Electronically Signed   By: Tollie Eth M.D.   On: 10/05/2017  22:09   Dg Chest Port 1 View  Result Date: 10/05/2017 CLINICAL DATA:  Short of breath EXAM: PORTABLE CHEST 1 VIEW COMPARISON:  04/20/2015 FINDINGS: Decreased lung volumes. Elevation of the left diaphragm. No acute airspace disease or effusion. Stable cardiomediastinal silhouette with aortic atherosclerosis. No pneumothorax. IMPRESSION: No active disease. Electronically Signed   By: Jasmine Pang M.D.   On: 10/05/2017 20:22    EKG: Independently reviewed.  Anterior lateral ST depressions.  This is new when compared to prior EKG from 2016.  Assessment/Plan Active Problems:   Acute encephalopathy  Acute encephalopathy Differential diagnosis medication versus infection CT head negative Patient did take CBD oil combined with his pain medications that he takes for peripheral vascular disease Ammonia level negative  LE pain Nl on exam  Elevated trop and ST depressions Requested that EDP contact cardiology Will trend Aspirin ordered x1 Ntg sl prn EKG in AM  AKI Baseline Cr 1.15, Cr on admit 1.38; GRF decr 2L of normal saline given in the emergency  room Gentle hydration overnight Checking magnesium and phosphorus  Hypertension Meds need review Prn hydrlazine  High cholesterol Meds need reviewn  Reflux Meds need review PPI IV  Chronic pain  hold tramadol, Percocet, Norco   Code Status: FC  DVT Prophylaxis: lovenox Family Communication: dgtr at bedisde Disposition Plan: Pending Improvement  Status: FC  Haydee Salter, MD Family Medicine [facility] [email or web address] Password [email or web address]

## 2017-10-06 DIAGNOSIS — E870 Hyperosmolality and hypernatremia: Secondary | ICD-10-CM | POA: Diagnosis present

## 2017-10-06 DIAGNOSIS — G9340 Encephalopathy, unspecified: Secondary | ICD-10-CM | POA: Diagnosis present

## 2017-10-06 DIAGNOSIS — K219 Gastro-esophageal reflux disease without esophagitis: Secondary | ICD-10-CM | POA: Diagnosis present

## 2017-10-06 DIAGNOSIS — Z7982 Long term (current) use of aspirin: Secondary | ICD-10-CM | POA: Diagnosis not present

## 2017-10-06 DIAGNOSIS — R4182 Altered mental status, unspecified: Secondary | ICD-10-CM | POA: Insufficient documentation

## 2017-10-06 DIAGNOSIS — T407X1A Poisoning by cannabis (derivatives), accidental (unintentional), initial encounter: Secondary | ICD-10-CM | POA: Diagnosis present

## 2017-10-06 DIAGNOSIS — J449 Chronic obstructive pulmonary disease, unspecified: Secondary | ICD-10-CM | POA: Diagnosis present

## 2017-10-06 DIAGNOSIS — M1990 Unspecified osteoarthritis, unspecified site: Secondary | ICD-10-CM | POA: Diagnosis present

## 2017-10-06 DIAGNOSIS — N189 Chronic kidney disease, unspecified: Secondary | ICD-10-CM | POA: Diagnosis present

## 2017-10-06 DIAGNOSIS — I129 Hypertensive chronic kidney disease with stage 1 through stage 4 chronic kidney disease, or unspecified chronic kidney disease: Secondary | ICD-10-CM | POA: Diagnosis present

## 2017-10-06 DIAGNOSIS — G8929 Other chronic pain: Secondary | ICD-10-CM | POA: Diagnosis present

## 2017-10-06 DIAGNOSIS — I739 Peripheral vascular disease, unspecified: Secondary | ICD-10-CM | POA: Diagnosis present

## 2017-10-06 DIAGNOSIS — R7989 Other specified abnormal findings of blood chemistry: Secondary | ICD-10-CM | POA: Diagnosis present

## 2017-10-06 DIAGNOSIS — N179 Acute kidney failure, unspecified: Secondary | ICD-10-CM

## 2017-10-06 DIAGNOSIS — B957 Other staphylococcus as the cause of diseases classified elsewhere: Secondary | ICD-10-CM | POA: Diagnosis present

## 2017-10-06 DIAGNOSIS — I2510 Atherosclerotic heart disease of native coronary artery without angina pectoris: Secondary | ICD-10-CM | POA: Diagnosis present

## 2017-10-06 DIAGNOSIS — N390 Urinary tract infection, site not specified: Secondary | ICD-10-CM | POA: Diagnosis present

## 2017-10-06 DIAGNOSIS — F17200 Nicotine dependence, unspecified, uncomplicated: Secondary | ICD-10-CM | POA: Diagnosis present

## 2017-10-06 DIAGNOSIS — E739 Lactose intolerance, unspecified: Secondary | ICD-10-CM | POA: Diagnosis present

## 2017-10-06 DIAGNOSIS — Z888 Allergy status to other drugs, medicaments and biological substances status: Secondary | ICD-10-CM | POA: Diagnosis not present

## 2017-10-06 DIAGNOSIS — G9341 Metabolic encephalopathy: Secondary | ICD-10-CM | POA: Diagnosis present

## 2017-10-06 DIAGNOSIS — R000 Tachycardia, unspecified: Secondary | ICD-10-CM | POA: Diagnosis present

## 2017-10-06 DIAGNOSIS — T40601A Poisoning by unspecified narcotics, accidental (unintentional), initial encounter: Secondary | ICD-10-CM | POA: Diagnosis present

## 2017-10-06 DIAGNOSIS — E785 Hyperlipidemia, unspecified: Secondary | ICD-10-CM | POA: Diagnosis present

## 2017-10-06 DIAGNOSIS — Z79899 Other long term (current) drug therapy: Secondary | ICD-10-CM | POA: Diagnosis not present

## 2017-10-06 DIAGNOSIS — Z9861 Coronary angioplasty status: Secondary | ICD-10-CM | POA: Diagnosis not present

## 2017-10-06 LAB — BLOOD GAS, ARTERIAL
Acid-base deficit: 2.2 mmol/L — ABNORMAL HIGH (ref 0.0–2.0)
Bicarbonate: 20.7 mmol/L (ref 20.0–28.0)
DRAWN BY: 105521
FIO2: 0.21
O2 Saturation: 97.5 %
PCO2 ART: 27.5 mmHg — AB (ref 32.0–48.0)
PO2 ART: 91.1 mmHg (ref 83.0–108.0)
Patient temperature: 98.6
pH, Arterial: 7.49 — ABNORMAL HIGH (ref 7.350–7.450)

## 2017-10-06 LAB — CBC
HEMATOCRIT: 40 % (ref 39.0–52.0)
HEMOGLOBIN: 12.7 g/dL — AB (ref 13.0–17.0)
MCH: 28.7 pg (ref 26.0–34.0)
MCHC: 31.8 g/dL (ref 30.0–36.0)
MCV: 90.3 fL (ref 78.0–100.0)
Platelets: 304 10*3/uL (ref 150–400)
RBC: 4.43 MIL/uL (ref 4.22–5.81)
RDW: 16.2 % — ABNORMAL HIGH (ref 11.5–15.5)
WBC: 9.8 10*3/uL (ref 4.0–10.5)

## 2017-10-06 LAB — BASIC METABOLIC PANEL
ANION GAP: 10 (ref 5–15)
BUN: 20 mg/dL (ref 6–20)
CO2: 21 mmol/L — AB (ref 22–32)
Calcium: 8.7 mg/dL — ABNORMAL LOW (ref 8.9–10.3)
Chloride: 117 mmol/L — ABNORMAL HIGH (ref 101–111)
Creatinine, Ser: 1.15 mg/dL (ref 0.61–1.24)
GFR calc Af Amer: >60 mL/min (ref >60)
GFR calc non Af Amer: >60 mL/min (ref >60)
GLUCOSE: 107 mg/dL — AB (ref 65–99)
POTASSIUM: 3.3 mmol/L — AB (ref 3.5–5.1)
Sodium: 148 mmol/L — ABNORMAL HIGH (ref 135–145)

## 2017-10-06 LAB — MAGNESIUM: MAGNESIUM: 2.1 mg/dL (ref 1.7–2.4)

## 2017-10-06 LAB — LACTIC ACID, PLASMA
Lactic Acid, Venous: 3.1 mmol/L (ref 0.5–1.9)
Lactic Acid, Venous: 1.3 mmol/L (ref 0.5–1.9)

## 2017-10-06 LAB — TROPONIN I: Troponin I: 0.04 ng/mL (ref <0.03)

## 2017-10-06 LAB — PHOSPHORUS: PHOSPHORUS: 3.1 mg/dL (ref 2.5–4.6)

## 2017-10-06 LAB — AMMONIA: AMMONIA: 13 umol/L (ref 9–35)

## 2017-10-06 MED ORDER — ACETAMINOPHEN 325 MG PO TABS: 650.0000 mg | ORAL_TABLET | Freq: Four times a day (QID) | ORAL | Status: DC | PRN

## 2017-10-06 MED ORDER — ACETAMINOPHEN 650 MG RE SUPP: 650.0000 mg | Freq: Four times a day (QID) | RECTAL | Status: DC | PRN

## 2017-10-06 MED ORDER — PANTOPRAZOLE SODIUM 40 MG IV SOLR
40.0000 mg | Freq: Every day | INTRAVENOUS | Status: DC
  Administered 2017-10-06 – 2017-10-08 (×3): 40 mg via INTRAVENOUS
  Filled 2017-10-06 (×3): qty 40

## 2017-10-06 MED ORDER — ZIPRASIDONE MESYLATE 20 MG IM SOLR
20.0000 mg | Freq: Once | INTRAMUSCULAR | Status: AC
  Administered 2017-10-06: 20 mg via INTRAMUSCULAR
  Filled 2017-10-06: qty 20

## 2017-10-06 MED ORDER — HALOPERIDOL LACTATE 5 MG/ML IJ SOLN
5.0000 mg | Freq: Four times a day (QID) | INTRAMUSCULAR | Status: DC | PRN
  Administered 2017-10-06: 5 mg via INTRAVENOUS
  Filled 2017-10-06: qty 1

## 2017-10-06 MED ORDER — LORAZEPAM 2 MG/ML IJ SOLN
1.0000 mg | INTRAMUSCULAR | Status: DC | PRN
  Administered 2017-10-06 – 2017-10-07 (×5): 1 mg via INTRAVENOUS
  Filled 2017-10-06 (×6): qty 1

## 2017-10-06 MED ORDER — ENOXAPARIN SODIUM 40 MG/0.4ML ~~LOC~~ SOLN
40.0000 mg | Freq: Every day | SUBCUTANEOUS | Status: DC
  Administered 2017-10-06 – 2017-10-07 (×2): 40 mg via SUBCUTANEOUS
  Filled 2017-10-06 (×4): qty 0.4

## 2017-10-06 MED ORDER — SODIUM CHLORIDE 0.9% FLUSH
3.0000 mL | Freq: Two times a day (BID) | INTRAVENOUS | Status: DC
  Administered 2017-10-06 – 2017-10-07 (×2): 3 mL via INTRAVENOUS

## 2017-10-06 MED ORDER — HYDRALAZINE HCL 20 MG/ML IJ SOLN
10.0000 mg | Freq: Three times a day (TID) | INTRAMUSCULAR | Status: DC | PRN
  Administered 2017-10-06: 10 mg via INTRAVENOUS
  Filled 2017-10-06: qty 1

## 2017-10-06 MED ORDER — ONDANSETRON HCL 4 MG PO TABS: 4.0000 mg | ORAL_TABLET | Freq: Four times a day (QID) | ORAL | Status: DC | PRN

## 2017-10-06 MED ORDER — LABETALOL HCL 5 MG/ML IV SOLN
5.0000 mg | INTRAVENOUS | Status: DC | PRN
  Administered 2017-10-06: 5 mg via INTRAVENOUS
  Filled 2017-10-06: qty 4

## 2017-10-06 MED ORDER — POTASSIUM CHLORIDE 10 MEQ/100ML IV SOLN
10.0000 meq | INTRAVENOUS | Status: AC
  Administered 2017-10-06 (×4): 10 meq via INTRAVENOUS
  Filled 2017-10-06 (×4): qty 100

## 2017-10-06 MED ORDER — SODIUM CHLORIDE 0.45 % IV SOLN
INTRAVENOUS | Status: DC
  Administered 2017-10-06 – 2017-10-08 (×5): via INTRAVENOUS

## 2017-10-06 MED ORDER — LORAZEPAM 2 MG/ML IJ SOLN
2.0000 mg | Freq: Once | INTRAMUSCULAR | Status: AC
  Administered 2017-10-06: 2 mg via INTRAVENOUS
  Filled 2017-10-06: qty 1

## 2017-10-06 MED ORDER — ONDANSETRON HCL 4 MG/2ML IJ SOLN
4.0000 mg | Freq: Four times a day (QID) | INTRAMUSCULAR | Status: DC | PRN
  Administered 2017-10-06: 4 mg via INTRAVENOUS
  Filled 2017-10-06: qty 2

## 2017-10-06 NOTE — ED Notes (Signed)
Admitting Provider at bedside. 

## 2017-10-06 NOTE — Progress Notes (Signed)
SLP Cancellation Note  Patient Details Name: Ray Smith MRN: [id number] DOB: Feb 28, 1946   Cancelled treatment:       Reason Eval/Treat Not Completed: Patient at procedure or test/unavailable. Pt with nursing at time of attempt. Unable to reattempt later due to scheduling. Will reattempt bedside swallow evaluation next date.   Metro Kung, MA, CCC-SLP 10/06/2017, 3:46 PM  [phone]

## 2017-10-06 NOTE — Progress Notes (Signed)
Unable to examine or obtain history from this patient. He is hemodynamically stable and appears comfortable while asleep. As soon as he is awoken he is aggressive , both physically and verbally. His ECG does indeed show more prominent ST changes in V4-V6, but not an acute STEMI. His cardiac troponin is marginally abnormal, likely not of true clinical significance. While it is likely that he has CAD (based on rich history of PAD: AAA and SFA disease), his current problems that led to hospitalization are not cardiac in origin. Will re-evaluate after his metabolic problems and encephalopathy have improved and he is able to cooperate with evaluation.  Thurmon Fair, MD, [facility] [phone] office [phone] pager

## 2017-10-06 NOTE — Progress Notes (Addendum)
PROGRESS NOTE    Ray Smith  ONG:[id number] DOB: 1946/03/06 DOA: 10/05/2017 PCP: Patient, No Pcp Per   Brief Narrative:72 y.o. male with past medical history significant coronary artery disease, prevesical vascular disease, chronic pain with narcotic use, hyperlipidemia hypertension presents emergency room with confusion and agitation.  Patient is on narcotics recently start taking CBD oil.  Patient daughter says he also uses marijuana off the street occasionally.  Normally very calm and respectful.  His agitated behavior is out of the ordinary for him.  Due to agitation patient was given Versed by EMS.  ED course: CT head negative.  Due to agitation UDS positive for benzos, opioids and THC.  In the emergency room patient continued to be agitated was given Haldol.  Hospitlaitst consulted for admission.  10/06/2017 when I saw the patient his morning his son and the daughter was in the room with the patient's son reported that he lives with the patient at home.  Patient takes multiple narcotics at home including  Norco, Percocet, and Ultram.  On top of it he takes CBD oil and smokes THC from street. Family reports that his pain medications went over within a period of 10-12 days.  Patient was in his usual state of health until a few days ago he still walks according to the family.  When I saw him this morning he was still not responding to any commands.  Was restless and moving his arms and legs.   Active Problems:   Acute encephalopathy  1] acute encephalopathy ?patient admitted with change in mental status moaning and groaning per family.  Family gives a history of patient finishing his narcotic medications before it was due.  He takes Percocet Norco and Ultram at home.  He also takes or smokes marijuana.  He has been using marijuana for years.  It looks like his encephalopathy is related to too much narcotics on board along with marijuana.  Watch for withdrawals.add ammonia levels.  2] AK I  with CKD and hypernatremia slow IV hydration with half-normal saline.  3] history of hypertension prn hydralazine  4] tachycardia secondary to #1.add lopressor.   5] history of PAD patient set up to have arteriogram/former smoker  6] elevated troponin with ST depressions in the last few weeks discussed with Dr. Duke Salvia will be seeing the patient today.     DVT prophylaxis: Lovenox Code Status full code Family Communication: Discussed with son and daughter Disposition Plan: TBD Consultants:  dw dr Duke Salvia Procedures: None  antimicrobials: None Subjective: Not able to answer any questions.  Objective: Patient restless moving extremities in bed Vitals:   10/06/17 0830 10/06/17 0900 10/06/17 0948 10/06/17 0952  BP: (!) 150/90 (!) 145/96 (!) 148/92   Pulse: (!) 108 (!) 118 (!) 118   Resp: (!) 30 (!) 30 14   Temp:    98.6 F (37 C)  TempSrc:    Oral  SpO2: 96% 98% 99%   Weight:    54.8 kg (120 lb 13 oz)    Intake/Output Summary (Last 24 hours) at 10/06/2017 1408 Last data filed at 10/06/2017 0753 Gross per 24 hour  Intake 2400 ml  Output 650 ml  Net 1750 ml   Filed Weights   10/05/17 2022 10/06/17 0952  Weight: 55.7 kg (122 lb 14.4 oz) 54.8 kg (120 lb 13 oz)    Examination:  General exam: Appears calm and comfortable  Respiratory system: Clear to auscultation. Respiratory effort normal. Cardiovascular system: S1 & S2 heard, RRR.  No JVD, murmurs, rubs, gallops or clicks. No pedal edema. Gastrointestinal system: Abdomen is nondistended, soft and nontender. No organomegaly or masses felt. Normal bowel sounds heard. Central nervous system: Alert and oriented. No focal neurological deficits. Extremities: Symmetric 5 x 5 power. Skin: No rashes, lesions or ulcers     Data Reviewed: I have personally reviewed following labs and imaging studies  CBC: Recent Labs  Lab 10/05/17 2012 10/06/17 0342  WBC 11.9* 9.8  NEUTROABS 11.1*  --   HGB 14.3 12.7*  HCT 44.7 40.0   MCV 90.9 90.3  PLT 345 304   Basic Metabolic Panel: Recent Labs  Lab 10/05/17 2012 10/06/17 0012 10/06/17 0342  NA 147*  --  148*  K 3.2*  --  3.3*  CL 113*  --  117*  CO2 20*  --  21*  GLUCOSE 134*  --  107*  BUN 23*  --  20  CREATININE 1.38*  --  1.15  CALCIUM 9.3  --  8.7*  MG  --  2.1  --   PHOS  --  3.1  --    GFR: CrCl cannot be calculated (Unknown ideal weight.). Liver Function Tests: Recent Labs  Lab 10/05/17 2012  AST 21  ALT 13*  ALKPHOS 111  BILITOT 0.8  PROT 6.8  ALBUMIN 3.8   No results for input(s): LIPASE, AMYLASE in the last 168 hours. Recent Labs  Lab 10/05/17 2012  AMMONIA 17   Coagulation Profile: No results for input(s): INR, PROTIME in the last 168 hours. Cardiac Enzymes: Recent Labs  Lab 10/05/17 2012 10/06/17 0012  TROPONINI 0.03* 0.04*   BNP (last 3 results) No results for input(s): PROBNP in the last 8760 hours. HbA1C: No results for input(s): HGBA1C in the last 72 hours. CBG: No results for input(s): GLUCAP in the last 168 hours. Lipid Profile: No results for input(s): CHOL, HDL, LDLCALC, TRIG, CHOLHDL, LDLDIRECT in the last 72 hours. Thyroid Function Tests: No results for input(s): TSH, T4TOTAL, FREET4, T3FREE, THYROIDAB in the last 72 hours. Anemia Panel: No results for input(s): VITAMINB12, FOLATE, FERRITIN, TIBC, IRON, RETICCTPCT in the last 72 hours. Sepsis Labs: Recent Labs  Lab 10/05/17 2024 10/06/17 0012 10/06/17 0342  LATICACIDVEN 2.85* 3.1* 1.3    No results found for this or any previous visit (from the past 240 hour(s)).       Radiology Studies: Ct Head Wo Contrast  Result Date: 10/05/2017 CLINICAL DATA:  Altered mental status and lethargy. EXAM: CT HEAD WITHOUT CONTRAST TECHNIQUE: Contiguous axial images were obtained from the base of the skull through the vertex without intravenous contrast. COMPARISON:  10/11/2009 FINDINGS: BRAIN: There is sulcal and ventricular prominence consistent with  superficial and central atrophy. No intraparenchymal hemorrhage, mass effect nor midline shift. Periventricular and subcortical white matter hypodensities consistent with chronic small vessel ischemic disease are identified. No acute large vascular territory infarcts. No abnormal extra-axial fluid collections. Basal cisterns are not effaced and midline. VASCULAR: 10/11/2009 SKULL: No skull fracture. No significant scalp soft tissue swelling. SINUSES/ORBITS: The mastoid air-cells are clear. The included paranasal sinuses are well-aerated.The included ocular globes and orbital contents are non-suspicious. OTHER: None. IMPRESSION: Diffuse small vessel ischemic disease, chronic in appearance. Atrophy. No acute intracranial abnormality. Electronically Signed   By: Tollie Eth M.D.   On: 10/05/2017 22:09   Dg Chest Port 1 View  Result Date: 10/05/2017 CLINICAL DATA:  Short of breath EXAM: PORTABLE CHEST 1 VIEW COMPARISON:  04/20/2015 FINDINGS: Decreased lung volumes. Elevation of  the left diaphragm. No acute airspace disease or effusion. Stable cardiomediastinal silhouette with aortic atherosclerosis. No pneumothorax. IMPRESSION: No active disease. Electronically Signed   By: Jasmine Pang M.D.   On: 10/05/2017 20:22        Scheduled Meds: . aspirin  324 mg Oral Once  . enoxaparin (LOVENOX) injection  40 mg Subcutaneous Daily  . pantoprazole (PROTONIX) IV  40 mg Intravenous Q breakfast  . sodium chloride flush  3 mL Intravenous Q12H   Continuous Infusions:   LOS: 0 days      Alwyn Ren, MD [facility]   If 7PM-7AM, please contact night-coverage [email or web address] Password [facility] 10/06/2017, 2:08 PM

## 2017-10-06 NOTE — Progress Notes (Signed)
Pt yelling, combative.  Re-enforced restraints multiple times.  Ativan not effective.  MD paged x 2

## 2017-10-06 NOTE — ED Notes (Signed)
Attempted report x1. 

## 2017-10-06 NOTE — Plan of Care (Signed)
  Problem: Education: Goal: Knowledge of General Education information will improve 10/06/2017 2013 by Renelda Mom, RN Outcome: Not Progressing 10/06/2017 2013 by Renelda Mom, RN Outcome: Not Progressing 10/06/2017 2013 by Renelda Mom, RN Outcome: Progressing

## 2017-10-06 NOTE — Progress Notes (Signed)
Patient refusing temperature.

## 2017-10-06 NOTE — ED Notes (Signed)
Pt demonstrating sudden marked incr in agitation. Repeatedly yelling "I'm pissing my britches!!" and cussing staff when attempting intervention. Rancour, MD aware.

## 2017-10-06 NOTE — Progress Notes (Signed)
Paged MD- pt agitated/yelling/combative.

## 2017-10-07 ENCOUNTER — Inpatient Hospital Stay (HOSPITAL_COMMUNITY): Payer: Medicare Other

## 2017-10-07 DIAGNOSIS — N179 Acute kidney failure, unspecified: Secondary | ICD-10-CM

## 2017-10-07 DIAGNOSIS — R4182 Altered mental status, unspecified: Secondary | ICD-10-CM

## 2017-10-07 DIAGNOSIS — G9340 Encephalopathy, unspecified: Secondary | ICD-10-CM

## 2017-10-07 LAB — BASIC METABOLIC PANEL
Anion gap: 10 (ref 5–15)
BUN: 14 mg/dL (ref 6–20)
CHLORIDE: 115 mmol/L — AB (ref 101–111)
CO2: 19 mmol/L — ABNORMAL LOW (ref 22–32)
Calcium: 8.7 mg/dL — ABNORMAL LOW (ref 8.9–10.3)
Creatinine, Ser: 1.1 mg/dL (ref 0.61–1.24)
Glucose, Bld: 111 mg/dL — ABNORMAL HIGH (ref 65–99)
POTASSIUM: 3.3 mmol/L — AB (ref 3.5–5.1)
SODIUM: 144 mmol/L (ref 135–145)

## 2017-10-07 LAB — CBC WITH DIFFERENTIAL/PLATELET
BASOS ABS: 0.1 10*3/uL (ref 0.0–0.1)
Basophils Relative: 1 %
EOS ABS: 0.1 10*3/uL (ref 0.0–0.7)
EOS PCT: 1 %
HCT: 38 % — ABNORMAL LOW (ref 39.0–52.0)
HEMOGLOBIN: 12.1 g/dL — AB (ref 13.0–17.0)
LYMPHS ABS: 1.6 10*3/uL (ref 0.7–4.0)
Lymphocytes Relative: 16 %
MCH: 28.9 pg (ref 26.0–34.0)
MCHC: 31.8 g/dL (ref 30.0–36.0)
MCV: 90.7 fL (ref 78.0–100.0)
Monocytes Absolute: 0.7 10*3/uL (ref 0.1–1.0)
Monocytes Relative: 7 %
NEUTROS PCT: 75 %
Neutro Abs: 7.3 10*3/uL (ref 1.7–7.7)
PLATELETS: 310 10*3/uL (ref 150–400)
RBC: 4.19 MIL/uL — AB (ref 4.22–5.81)
RDW: 16.5 % — ABNORMAL HIGH (ref 11.5–15.5)
WBC: 9.6 10*3/uL (ref 4.0–10.5)

## 2017-10-07 MED ORDER — KETOROLAC TROMETHAMINE 30 MG/ML IJ SOLN
30.0000 mg | Freq: Once | INTRAMUSCULAR | Status: AC
  Administered 2017-10-07: 30 mg via INTRAVENOUS
  Filled 2017-10-07: qty 1

## 2017-10-07 NOTE — Progress Notes (Signed)
Pt more awake now.  Attempted to talk to him. As soon as I walked in the room he yelled at me to get out and to leave him alone. Refused Lovenox injection again. Pt is more calm when staff is out of the room. Pt gets irate with any nursing care or conversation. Marya Landry, RN

## 2017-10-07 NOTE — Progress Notes (Signed)
Pt alert and oriented x4 and more cooperative. Pt's sister, niece and friend are here visiting and he is really responding well with them. Pt said his thumb is feeling like it is broken and family states that he usually uses that thumb without any difficulty. Called Dr. Isidoro Donning and got an order for an xray. Marya Landry, RN

## 2017-10-07 NOTE — Evaluation (Signed)
Clinical/Bedside Swallow Evaluation Patient Details  Name: Ray Smith MRN: [id number] Date of Birth: 1945-11-05  Today's Date: 10/07/2017 Time: SLP Start Time (ACUTE ONLY): 1530 SLP Stop Time (ACUTE ONLY): 1549 SLP Time Calculation (min) (ACUTE ONLY): 19 min  Past Medical History:  Past Medical History:  Diagnosis Date  . Coronary artery disease    a.  presented with CP and inf STE but no ACS - LHC (10/23/13):  Dist LM 40-60%, ostial LAD 80%, mid LAD 50%, ostial CFX 50-70%, mid RCA 50%.  EF 60%. - CP not felt to be ischemic; CABG vs Med Rx d/w pt - pt opted for Med Rx  . Hyperlipidemia   . Hypertension    Past Surgical History:  Past Surgical History:  Procedure Laterality Date  . LEFT HEART CATHETERIZATION WITH CORONARY ANGIOGRAM Bilateral 10/23/2013   Procedure: LEFT HEART CATHETERIZATION WITH CORONARY ANGIOGRAM;  Surgeon: Lesleigh Noe, MD;  Location: [facility] C[facility] LAB;  Service: Cardiovascular;  Laterality: Bilateral;  . snake bite surgery     HPI:  Ray Smith is a 72 y.o. male with past medical history significant coronary artery disease, prevesical vascular disease, chronic pain with narcotic use, hyperlipidemia hypertension presents emergency room with confusion and agitation.  Patient is on narcotics recently start taking CBD oil.  Patient daughter says he also uses marijuana off the street occasionally.  Normally very calm and respectful.  His agitated behavior is out of the ordinary for him.  Most recent chest xray and head CT are negative for acute findings.     Assessment / Plan / Recommendation Clinical Impression  Clinical swallowing evaluation completed using thin liquids via cup and straw, pureed material and dry solids.  Patient was easily agitated and refused to complete all requested tasks.   Oral mechanism exam was completed and unremarkable.  He is missing most of his teeth and does not have dentures.  The patient's oral and pharyngeal swallow appeared to be  functional.  Swallow trigger appeared to be timely and hyo-laryngeal excursion was appreciated to palpation.  Mastication of dry solids appeared to be functional despite lack of dentition.  3 oz water challenge was attempted and due to confusion the patient was unable/refused to complete.  No overt s/s of aspiration were seen with what was given.  Recommend initiate a regular diet with thin liquids.  ST to follow briefly for therapuetic diet tolerance.   SLP Visit Diagnosis: Dysphagia, unspecified (R13.10)    Aspiration Risk  Mild aspiration risk    Diet Recommendation   Regular with thin liquids  Medication Administration: Whole meds with liquid    Other  Recommendations Oral Care Recommendations: Oral care BID   Follow up Recommendations Other (comment)(TBD - likely none)      Frequency and Duration min 2x/week  2 weeks       Prognosis Prognosis for Safe Diet Advancement: Good      Swallow Study   General Date of Onset: 10/05/17 HPI: Ray Smith is a 72 y.o. male with past medical history significant coronary artery disease, prevesical vascular disease, chronic pain with narcotic use, hyperlipidemia hypertension presents emergency room with confusion and agitation.  Patient is on narcotics recently start taking CBD oil.  Patient daughter says he also uses marijuana off the street occasionally.  Normally very calm and respectful.  His agitated behavior is out of the ordinary for him.  Most recent chest xray and head CT are negative for acute findings.   Type  of Study: Bedside Swallow Evaluation Previous Swallow Assessment: none in chart Diet Prior to this Study: NPO Temperature Spikes Noted: No Respiratory Status: Room air History of Recent Intubation: No Behavior/Cognition: Alert;Agitated;Uncooperative Oral Cavity Assessment: Within Functional Limits Oral Care Completed by SLP: No Oral Cavity - Dentition: Missing dentition;Poor condition Vision: Functional for  self-feeding Self-Feeding Abilities: Able to feed self Patient Positioning: Upright in bed Baseline Vocal Quality: Normal Volitional Cough: Strong Volitional Swallow: Able to elicit    Oral/Motor/Sensory Function Overall Oral Motor/Sensory Function: Within functional limits   Ice Chips Ice chips: Not tested   Thin Liquid Thin Liquid: Within functional limits Presentation: Cup;Straw;Self Fed    Nectar Thick Nectar Thick Liquid: Not tested   Honey Thick Honey Thick Liquid: Not tested   Puree Puree: Within functional limits Presentation: Self Fed   Solid   GO   Solid: Within functional limits Presentation: Self Fed       Dimas Aguas, MA, CCC-SLP Acute Rehab SLP [phone] Fleet Contras 10/07/2017,3:57 PM

## 2017-10-07 NOTE — Progress Notes (Signed)
[facility]                                                                              Patient Demographics  Ray Smith, is a 72 y.o. male, DOB - Oct 11, 1945, AOZ:[id number]  Admit date - 10/05/2017   Admitting Physician Haydee Salter, MD  Outpatient Primary MD for the patient is Patient, No Pcp Per  Outpatient specialists:   LOS - 1  days   Medical records reviewed and are as summarized below:    Chief Complaint  Patient presents with  . Altered Mental Status       Brief summary  Ray Smith is a 72 y.o. male with past medical history significant coronary artery disease, prevesical vascular disease, chronic pain with narcotic use, hyperlipidemia hypertension presents emergency room with confusion and agitation.  Patient is on narcotics recently start taking CBD oil.  Patient daughter says he also uses marijuana off the street occasionally.  Normally very calm and respectful.  His agitated behavior is out of the ordinary for him. Due to agitation patient was given Versed by EMS. UDS was positive for benzos, opiates and THC.  Assessment & Plan    Principal Problem:   Acute metabolic encephalopathy -CT head negative, unclear etiology, possibly worsened due to polypharmacy and UTI -Discussed with patient's son in detail who reported that patient had been taking CBD/hemp bomb pills in large quantities.  He also reported that patient was on steroids 3-4 weeks ago for osteoarthritis flare and was somewhat confused but got better after the steroids were tapered off.  Per patient's son patient went to [facility] ED multiple times for pain and osteoarthritis flare.  Apparently, patient had also been taking large quantities of Percocet and hydrocodone.  Per son, on Friday, 10/05/17 day before the admission, he noticed that bottles were mostly empty. -ABG did not show any hypercarbia, UA showed ketones with UTI, UDS positive for benzodiazepines, opiates and THC -Per  son, at baseline patient is functional and oriented and lives alone.  However he does not feel that patient will be able to live home alone. -Continue restraints, hold tramadol, Percocet, Norco, continue IV fluid hydration  Active Problems:   COPD (chronic obstructive pulmonary disease) (HCC) -Currently no wheezing, continue to follow closely    Hypertension -Continue as needed hydralazine    AKI (acute kidney injury) (HCC) -Baseline creatinine 1.1, creatinine on admission 1.3, continue IV fluid hydration  UTI -Continue IV Rocephin, follow urine culture and sensitivities  Elevated troponins with ST depression -Cardiology was consulted, recommended elevated troponins likely not cardiac in addition and will evaluate after encephalopathy has improved.  Code Status: Full code DVT Prophylaxis:  SCD's Family Communication: Discussed in detail with the patient, all imaging results, lab results explained to the patient's son    Disposition Plan:  Time Spent in minutes  35 minutes  Procedures:    Consultants:   None  Antimicrobials:      Medications  Scheduled Meds: . aspirin  324 mg Oral Once  . enoxaparin (LOVENOX) injection  40 mg Subcutaneous Daily  . pantoprazole (PROTONIX) IV  40 mg Intravenous  Q breakfast  . sodium chloride flush  3 mL Intravenous Q12H   Continuous Infusions: . sodium chloride 125 mL/hr at 10/07/17 0811   PRN Meds:.acetaminophen **OR** acetaminophen, haloperidol lactate, hydrALAZINE, labetalol, LORazepam, nitroGLYCERIN, ondansetron **OR** ondansetron (ZOFRAN) IV   Antibiotics   Anti-infectives (From admission, onward)   Start     Dose/Rate Route Frequency Ordered Stop   10/05/17 2215  cefTRIAXone (ROCEPHIN) 1 g in sodium chloride 0.9 % 100 mL IVPB     1 g 200 mL/hr over 30 Minutes Intravenous  Once 10/05/17 2205 10/06/17 0203        Subjective:   Ray Smith was seen and examined today.  Very confused, does not respond with appropriate  answers difficult to obtain review of system from the patient. Has been agitated and combative overnight. Objective:   Vitals:   10/06/17 2350 10/07/17 0412 10/07/17 0820 10/07/17 1245  BP: 122/84 127/81 135/85 (!) 151/93  Pulse:  79 80   Resp: (!) 30 (!) 30 (!) 21 (!) 21  Temp:  98.1 F (36.7 C)    TempSrc:  Axillary    SpO2: 97% 96% 98%   Weight:        Intake/Output Summary (Last 24 hours) at 10/07/2017 1429 Last data filed at 10/07/2017 0600 Gross per 24 hour  Intake 1816.67 ml  Output 500 ml  Net 1316.67 ml     Wt Readings from Last 3 Encounters:  10/06/17 54.8 kg (120 lb 13 oz)  10/25/13 58.4 kg (128 lb 12.8 oz)     Exam  General: Awake, alert however not responding to verbal commands, refuses to be examined  Eyes:   HEENT:    Cardiovascular: S1 S2 auscultated, RRR  Respiratory: Clear to auscultation bilaterally, no wheezing, rales or rhonchi  Gastrointestinal: Soft, nontender, nondistended, + bowel sounds  Ext: no pedal edema bilaterally  Neuro: does not follow commands  Musculoskeletal: No digital cyanosis, clubbing  Skin: No rashes  Psych: confused   Data Reviewed:  I have personally reviewed following labs and imaging studies  Micro Results Recent Results (from the past 240 hour(s))  Blood culture (routine x 2)     Status: None (Preliminary result)   Collection Time: 10/05/17  8:12 PM  Result Value Ref Range Status   Specimen Description BLOOD RIGHT ANTECUBITAL  Final   Special Requests   Final    BOTTLES DRAWN AEROBIC AND ANAEROBIC Blood Culture results may not be optimal due to an excessive volume of blood received in culture bottles   Culture   Final    NO GROWTH 2 DAYS Performed at [facility], [address] [place], [place] [address]    Report Status PENDING  Incomplete  Blood culture (routine x 2)     Status: None (Preliminary result)   Collection Time: 10/05/17  8:27 PM  Result Value Ref Range Status   Specimen  Description BLOOD LEFT ANTECUBITAL  Final   Special Requests   Final    BOTTLES DRAWN AEROBIC AND ANAEROBIC Blood Culture adequate volume   Culture   Final    NO GROWTH 2 DAYS Performed at [facility], [address], [place], [place] [address]    Report Status PENDING  Incomplete  Urine culture     Status: Abnormal (Preliminary result)   Collection Time: 10/05/17  9:38 PM  Result Value Ref Range Status   Specimen Description URINE, CATHETERIZED  Final   Special Requests NONE  Final   Culture (  A)  Final    >=100,000 COLONIES/mL STAPHYLOCOCCUS SPECIES (COAGULASE NEGATIVE) SUSCEPTIBILITIES TO FOLLOW Performed at [facility], [address] [place], [place] [address]    Report Status PENDING  Incomplete    Radiology Reports Ct Head Wo Contrast  Result Date: 10/05/2017 CLINICAL DATA:  Altered mental status and lethargy. EXAM: CT HEAD WITHOUT CONTRAST TECHNIQUE: Contiguous axial images were obtained from the base of the skull through the vertex without intravenous contrast. COMPARISON:  10/11/2009 FINDINGS: BRAIN: There is sulcal and ventricular prominence consistent with superficial and central atrophy. No intraparenchymal hemorrhage, mass effect nor midline shift. Periventricular and subcortical white matter hypodensities consistent with chronic small vessel ischemic disease are identified. No acute large vascular territory infarcts. No abnormal extra-axial fluid collections. Basal cisterns are not effaced and midline. VASCULAR: 10/11/2009 SKULL: No skull fracture. No significant scalp soft tissue swelling. SINUSES/ORBITS: The mastoid air-cells are clear. The included paranasal sinuses are well-aerated.The included ocular globes and orbital contents are non-suspicious. OTHER: None. IMPRESSION: Diffuse small vessel ischemic disease, chronic in appearance. Atrophy. No acute intracranial abnormality. Electronically Signed   By: Tollie Eth M.D.   On: 10/05/2017 22:09   Dg Chest  Port 1 View  Result Date: 10/05/2017 CLINICAL DATA:  Short of breath EXAM: PORTABLE CHEST 1 VIEW COMPARISON:  04/20/2015 FINDINGS: Decreased lung volumes. Elevation of the left diaphragm. No acute airspace disease or effusion. Stable cardiomediastinal silhouette with aortic atherosclerosis. No pneumothorax. IMPRESSION: No active disease. Electronically Signed   By: Jasmine Pang M.D.   On: 10/05/2017 20:22    Lab Data:  CBC: Recent Labs  Lab 10/05/17 2012 10/06/17 0342 10/07/17 0823  WBC 11.9* 9.8 9.6  NEUTROABS 11.1*  --  7.3  HGB 14.3 12.7* 12.1*  HCT 44.7 40.0 38.0*  MCV 90.9 90.3 90.7  PLT 345 304 310   Basic Metabolic Panel: Recent Labs  Lab 10/05/17 2012 10/06/17 0012 10/06/17 0342 10/07/17 0823  NA 147*  --  148* 144  K 3.2*  --  3.3* 3.3*  CL 113*  --  117* 115*  CO2 20*  --  21* 19*  GLUCOSE 134*  --  107* 111*  BUN 23*  --  20 14  CREATININE 1.38*  --  1.15 1.10  CALCIUM 9.3  --  8.7* 8.7*  MG  --  2.1  --   --   PHOS  --  3.1  --   --    GFR: CrCl cannot be calculated (Unknown ideal weight.). Liver Function Tests: Recent Labs  Lab 10/05/17 2012  AST 21  ALT 13*  ALKPHOS 111  BILITOT 0.8  PROT 6.8  ALBUMIN 3.8   No results for input(s): LIPASE, AMYLASE in the last 168 hours. Recent Labs  Lab 10/05/17 2012 10/06/17 1623  AMMONIA 17 13   Coagulation Profile: No results for input(s): INR, PROTIME in the last 168 hours. Cardiac Enzymes: Recent Labs  Lab 10/05/17 2012 10/06/17 0012  TROPONINI 0.03* 0.04*   BNP (last 3 results) No results for input(s): PROBNP in the last 8760 hours. HbA1C: No results for input(s): HGBA1C in the last 72 hours. CBG: No results for input(s): GLUCAP in the last 168 hours. Lipid Profile: No results for input(s): CHOL, HDL, LDLCALC, TRIG, CHOLHDL, LDLDIRECT in the last 72 hours. Thyroid Function Tests: No results for input(s): TSH, T4TOTAL, FREET4, T3FREE, THYROIDAB in the last 72 hours. Anemia Panel: No  results for input(s): VITAMINB12, FOLATE, FERRITIN, TIBC, IRON, RETICCTPCT in the last  72 hours. Urine analysis:    Component Value Date/Time   COLORURINE YELLOW 10/05/2017 2047   APPEARANCEUR HAZY (A) 10/05/2017 2047   LABSPEC 1.015 10/05/2017 2047   PHURINE 6.0 10/05/2017 2047   GLUCOSEU NEGATIVE 10/05/2017 2047   HGBUR SMALL (A) 10/05/2017 2047   BILIRUBINUR NEGATIVE 10/05/2017 2047   KETONESUR 5 (A) 10/05/2017 2047   PROTEINUR 30 (A) 10/05/2017 2047   NITRITE POSITIVE (A) 10/05/2017 2047   LEUKOCYTESUR MODERATE (A) 10/05/2017 2047     Kevontay Burks M.D. [facility] 10/07/2017, 2:29 PM  Pager: [phone] Between 7am to 7pm - call Pager - [phone]  After 7pm go to [email or web address] - password [email or web address]  Call night coverage person covering after 7pm

## 2017-10-07 NOTE — Progress Notes (Signed)
SLP Cancellation Note  Patient Details Name: Ray Smith MRN: [id number] DOB: March 17, 1946   Cancelled treatment:       Reason Eval/Treat Not Completed: Other (comment)(Patient finally sleeping peacefully after being combative.  Nursing requesting that we hold off on evaluation for now.  Requested she contact ST if patient becomes more alert otherwise ST to follow up next date)  Dimas Aguas, [place], CCC-SLP Acute Rehab SLP [phone]  Fleet Contras 10/07/2017, 11:05 AM

## 2017-10-07 NOTE — Progress Notes (Signed)
Pt awake but very abusive cussing, threatening  and swinging at nursing staff and his son. Tried to reason with patient with no success. Attempted to calm pt without success.  Pt son untied left arm restraint. Pt pulled off all lines while untied. Restraints retied by two nurses. Informed the son that he can't tie or untie the restraints.  Gave pt 1mg  Ativan IV. Pt more calm after 10 minutes. Marya Landry, RN

## 2017-10-07 NOTE — Plan of Care (Signed)
  Problem: Education: Goal: Knowledge of General Education information will improve Outcome: Progressing   Problem: Health Behavior/Discharge Planning: Goal: Ability to manage health-related needs will improve Outcome: Progressing   Problem: Clinical Measurements: Goal: Ability to maintain clinical measurements within normal limits will improve Outcome: Progressing   

## 2017-10-08 LAB — URINE CULTURE

## 2017-10-08 MED ORDER — POTASSIUM CHLORIDE CRYS ER 20 MEQ PO TBCR: 40.0000 meq | EXTENDED_RELEASE_TABLET | Freq: Once | ORAL | Status: DC

## 2017-10-08 MED ORDER — ASPIRIN 325 MG PO TBEC: 325.0000 mg | DELAYED_RELEASE_TABLET | Freq: Every day | ORAL | 3 refills | Status: DC

## 2017-10-08 MED ORDER — TRAMADOL HCL 50 MG PO TABS: 50.0000 mg | ORAL_TABLET | Freq: Four times a day (QID) | ORAL | 0 refills | Status: DC | PRN

## 2017-10-08 MED ORDER — LOVASTATIN 20 MG PO TABS: 20.0000 mg | ORAL_TABLET | Freq: Every day | ORAL | 11 refills | Status: DC

## 2017-10-08 MED ORDER — KETOROLAC TROMETHAMINE 15 MG/ML IJ SOLN: 15.0000 mg | Freq: Four times a day (QID) | INTRAMUSCULAR | Status: DC | PRN

## 2017-10-08 MED ORDER — KETOROLAC TROMETHAMINE 30 MG/ML IJ SOLN
15.0000 mg | Freq: Once | INTRAMUSCULAR | Status: AC
  Administered 2017-10-08: 15 mg via INTRAVENOUS
  Filled 2017-10-08: qty 1

## 2017-10-08 MED ORDER — DOXYCYCLINE HYCLATE 100 MG PO TABS: 100.0000 mg | ORAL_TABLET | Freq: Two times a day (BID) | ORAL | Status: DC

## 2017-10-08 MED ORDER — DOXYCYCLINE HYCLATE 100 MG PO TABS: 100.0000 mg | ORAL_TABLET | Freq: Two times a day (BID) | ORAL | 0 refills | Status: DC

## 2017-10-08 MED ORDER — PANTOPRAZOLE SODIUM 40 MG PO TBEC: 40.0000 mg | DELAYED_RELEASE_TABLET | Freq: Every day | ORAL | Status: DC

## 2017-10-08 NOTE — Progress Notes (Signed)
OT eval ordered for the pt. Pt and family declining this service. Pt states his son is on his way and he just wants to go home. MD aware. Will continue to monitor.

## 2017-10-08 NOTE — Evaluation (Signed)
Physical Therapy Evaluation Patient Details Name: Ray Smith MRN: [id number] DOB: 07-25-1945 Today's Date: 10/08/2017   History of Present Illness  Pt is a 72 y/o male admitted secondary to AMS and agitation. CT negative for acute abnormality and DG of R hand negative for acute abnormaltiy. PMH includes CAD, HTN, PVD, COPD, tobacco abuse.   Clinical Impression  Pt admitted secondary to problem above with deficits below. Pt very pleasant throughout session, however, presenting with poor safety awareness with mobility. Pt reports his MD in [place] told him he could not bear weight on RLE, however, did not see any reference in the notes. Pt maintained NWB on RLE throughout mobility with RW. Pt requiring min to mod A for mobility with RW. Educated about mobility concerns and feel pt is a high fall risk. Feel pt would benefit from short term SNF, however, pt refusing and wanting to go home. Reports his siblings and other family could help; pt's sister and niece in room and they confirmed. Will continue to follow acutely to maximize functional mobility independence and safety.     Follow Up Recommendations Home health PT;Supervision/Assistance - 24 hour(Pt refusing SNF )    Equipment Recommendations  None recommended by PT(Pt refusing RW )    Recommendations for Other Services OT consult     Precautions / Restrictions Precautions Precautions: Fall Restrictions Other Position/Activity Restrictions: Per pt, MD at [place] said he could not put any weight on RLE, however, did not see NWB in notes. Pt refusing to put weight and maintained NWB on RLE throughout.       Mobility  Bed Mobility Overal bed mobility: Modified Independent             General bed mobility comments: Increased time, however, no assist required.   Transfers Overall transfer level: Needs assistance Equipment used: Rolling walker (2 wheeled) Transfers: Sit to/from Stand Sit to Stand: Min assist          General transfer comment: Min A for lift assist and steadying. Pt not following cues for safe hand placement. Pt with unsafe technique to sit at EOB, so educated about importance of backing up to surface and bringing RW with him prior to sitting.   Ambulation/Gait Ambulation/Gait assistance: Min assist;Mod assist Ambulation Distance (Feet): 50 Feet Assistive device: Rolling walker (2 wheeled) Gait Pattern/deviations: Step-to pattern Gait velocity: Decreased  Gait velocity interpretation: <1.31 ft/sec, indicative of household ambulator General Gait Details: Unsteady gait, requiring min to mod A for steadying and cues for safety with RW. Pt required cues for proximity to device and appropriate gait speed. Further distance limited by fatigue.   Stairs Stairs: Yes       General stair comments: Verbal education and handout provided to pt and family about safety with stairs at home as pt unable to attempt this session. Gave handout with use of RW and with crutches.   Wheelchair Mobility    Modified Rankin (Stroke Patients Only)       Balance Overall balance assessment: Needs assistance Sitting-balance support: No upper extremity supported;Feet supported Sitting balance-Leahy Scale: Fair     Standing balance support: Bilateral upper extremity supported;During functional activity Standing balance-Leahy Scale: Poor Standing balance comment: Reliant on BUE support.                              Pertinent Vitals/Pain Pain Assessment: Faces Faces Pain Scale: Hurts little more Pain Location: RLE  Pain  Descriptors / Indicators: Aching;Burning Pain Intervention(s): Limited activity within patient's tolerance;Monitored during session;Repositioned    Home Living Family/patient expects to be discharged to:: Private residence Living Arrangements: Other relatives(siblings ) Available Help at Discharge: Family;Available PRN/intermittently Type of Home: House Home Access:  Stairs to enter Entrance Stairs-Rails: None Entrance Stairs-Number of Steps: 2 Home Layout: One level Home Equipment: Crutches;Walker - 2 wheels;Shower seat      Prior Function Level of Independence: Independent with assistive device(s)         Comments: USed crtuches      Hand Dominance        Extremity/Trunk Assessment   Upper Extremity Assessment Upper Extremity Assessment: Defer to OT evaluation    Lower Extremity Assessment Lower Extremity Assessment: RLE deficits/detail;Generalized weakness RLE Deficits / Details: R foot pain at baseline. Per pt, MD at [place] told him he could not put any weight on RLE.      Cervical / Trunk Assessment Cervical / Trunk Assessment: Kyphotic  Communication   Communication: No difficulties  Cognition Arousal/Alertness: Awake/alert Behavior During Therapy: WFL for tasks assessed/performed Overall Cognitive Status: Impaired/Different from baseline Area of Impairment: Following commands;Safety/judgement;Problem solving                       Following Commands: Follows one step commands inconsistently;Follows one step commands with increased time Safety/Judgement: Decreased awareness of safety;Decreased awareness of deficits   Problem Solving: Slow processing;Requires verbal cues General Comments: Pt very pleasant throughout session. Pt with poor safety awareness, and not following cues for increased safety with RW. When asked how pt was going to get up the steps at home while maintaining NWB on RLE, he reports "they'll carry me." When asked who his visitors were, pt did not know and sister had to state relationship.       General Comments General comments (skin integrity, edema, etc.): Pt's sister and niece present. Educated about concerns with mobility, however, pt reports he wants to go home and refusing SNF at this time. Educated about need for 24/7 assist at home and assist with mobility and pt and family agreeable.      Exercises     Assessment/Plan    PT Assessment Patient needs continued PT services  PT Problem List Decreased strength;Decreased balance;Decreased mobility;Decreased cognition;Decreased knowledge of use of DME;Decreased safety awareness;Decreased knowledge of precautions;Pain       PT Treatment Interventions DME instruction;Gait training;Stair training;Functional mobility training;Therapeutic activities;Therapeutic exercise;Balance training;Neuromuscular re-education;Patient/family education    PT Goals (Current goals can be found in the Care Plan section)  Acute Rehab PT Goals Patient Stated Goal: to go home PT Goal Formulation: With patient Time For Goal Achievement: 10/22/17 Potential to Achieve Goals: Fair    Frequency Min 3X/week   Barriers to discharge        Co-evaluation               AM-PAC PT "6 Clicks" Daily Activity  Outcome Measure Difficulty turning over in bed (including adjusting bedclothes, sheets and blankets)?: A Little Difficulty moving from lying on back to sitting on the side of the bed? : A Little Difficulty sitting down on and standing up from a chair with arms (e.g., wheelchair, bedside commode, etc,.)?: Unable Help needed moving to and from a bed to chair (including a wheelchair)?: A Lot Help needed walking in hospital room?: A Lot Help needed climbing 3-5 steps with a railing? : A Lot 6 Click Score: 13    End of  Session Equipment Utilized During Treatment: Gait belt Activity Tolerance: Patient limited by fatigue Patient left: in bed;with call bell/phone within reach;with bed alarm set;with family/visitor present Nurse Communication: Mobility status PT Visit Diagnosis: Unsteadiness on feet (R26.81);Difficulty in walking, not elsewhere classified (R26.2);Muscle weakness (generalized) (M62.81)    Time: 7829-5621 PT Time Calculation (min) (ACUTE ONLY): 30 min   Charges:   PT Evaluation $PT Eval Moderate Complexity: 1 Mod PT  Treatments $Gait Training: 8-22 mins   PT G Codes:        Gladys Damme, PT, DPT  Acute Rehabilitation Services  Pager: [phone]   Lehman Prom 10/08/2017, 12:15 PM

## 2017-10-08 NOTE — Discharge Summary (Signed)
Physician Discharge Summary   Patient ID: Ray Smith MRN: [id number] DOB/AGE: 1945/12/16 72 y.o.  Admit date: 10/05/2017 Discharge date: 10/08/2017  Primary Care Physician:  Patient, No Pcp Per   Recommendations for Outpatient Follow-up:  1. Follow up with PCP in 1-2 weeks 2. Please obtain BMP/CBC in one week  Home Health: None Equipment/Devices:   Discharge Condition: stable CODE STATUS: FULL  Diet recommendation:    Discharge Diagnoses:    . Acute metabolic encephalopathy . COPD (chronic obstructive pulmonary disease) (HCC) . Hypertension Coagulase-negative staph UTI  Elevated troponin   Consults: Cardiology    Allergies:   Allergies  Allergen Reactions  . Tramadol Anaphylaxis  . Flexeril [Cyclobenzaprine] Other (See Comments)    Per patient "it made my heart stop"  . Ibuprofen Other (See Comments)    Overuse damages pt's kidneys Overuse damages pt's kidneys   . Lactose Intolerance (Gi)     Digestive issues     DISCHARGE MEDICATIONS: Allergies as of 10/08/2017      Reactions   Tramadol Anaphylaxis   Flexeril [cyclobenzaprine] Other (See Comments)   Per patient "it made my heart stop"   Ibuprofen Other (See Comments)   Overuse damages pt's kidneys Overuse damages pt's kidneys   Lactose Intolerance (gi)    Digestive issues      Medication List    STOP taking these medications   HYDROcodone-acetaminophen 5-325 MG tablet Commonly known as:  NORCO/VICODIN     TAKE these medications   albuterol 108 (90 Base) MCG/ACT inhaler Commonly known as:  PROVENTIL HFA;VENTOLIN HFA Inhale 2 puffs into the lungs every 6 (six) hours as needed for wheezing.   aspirin 325 MG EC tablet Take 1 tablet (325 mg total) by mouth daily.   bismuth subsalicylate 262 MG/15ML suspension Commonly known as:  PEPTO BISMOL Take 30 mLs by mouth as needed.   docusate sodium 100 MG capsule Commonly known as:  COLACE Take 200 mg by mouth as needed for mild  constipation.   doxycycline 100 MG tablet Commonly known as:  VIBRA-TABS Take 1 tablet (100 mg total) by mouth 2 (two) times daily. X 7 days   lovastatin 20 MG tablet Commonly known as:  MEVACOR Take 1 tablet (20 mg total) by mouth at bedtime.   naproxen sodium 220 MG tablet Commonly known as:  ALEVE Take 440 mg by mouth as needed (pain).   nitroGLYCERIN 0.4 MG SL tablet Commonly known as:  NITROSTAT Place under your tongue if you have severe chest pressure or tightness   pantoprazole 40 MG tablet Commonly known as:  PROTONIX Take 1 tablet (40 mg total) by mouth daily.   Potassium 95 MG Tabs Take 1 tablet by mouth daily.   traMADol 50 MG tablet Commonly known as:  ULTRAM Take 1 tablet (50 mg total) by mouth every 6 (six) hours as needed.   vitamin C 500 MG tablet Commonly known as:  ASCORBIC ACID Take 500 mg by mouth daily.        Brief H and P: For complete details please refer to admission H and P, but in brief Ray Smith a 72 y.o.malewith past medical history significant coronary artery disease, prevesical vascular disease, chronic pain with narcotic use, hyperlipidemia hypertension presents emergency room with confusion and agitation. Patient is on narcotics recently start taking CBD oil.Patient daughter says he also uses marijuana off the street occasionally. Normally very calm and respectful. His agitated behavior is out of the ordinary for him. Due toagitation  patient was given Versed by EMS. UDS was positive for benzos, opiates and THC.  Hospital Course:   Acute metabolic encephalopathy -CT head negative, unclear etiology, possibly worsened due to polypharmacy and UTI -Discussed with patient's son in detail who reported that patient had been taking CBD/hemp bomb pills in large quantities.  He also reported that patient was on steroids 3-4 weeks ago for osteoarthritis flare and was somewhat confused but got better after the steroids were tapered off.   Per patient's son patient went to [facility] ED multiple times for pain and osteoarthritis flare.  Apparently, patient had also been taking large quantities of Percocet and hydrocodone.  Per son, on Friday, 10/05/17 day before the admission, he noticed that bottles were mostly empty. -ABG did not show any hypercarbia, UA showed ketones with UTI, UDS positive for benzodiazepines, opiates and THC -Patient was placed on restraints briefly, IV fluid hydration.  Narcotics including Percocet, Norco were  held. -Patient is today alert and oriented x3, able to provide his own history and verified taking the CBD pills.  He denied that he was taking too much Percocet or hydrocodone.  Strongly recommended patient to stop the narcotics.  He has vascular surgery planned on Thursday, 10/11/17 -PT evaluation recommended home health PT or short-term rehab.  Patient declined short-term rehab.      COPD (chronic obstructive pulmonary disease) (HCC) -Currently no wheezing    Hypertension -BP remains stable    AKI (acute kidney injury) (HCC) -Baseline creatinine 1.1, creatinine on admission 1.3, patient was placed on IV fluids Creatinine 1.1 at the time of discharge.  Coagulase-negative staph UTI -Patient had UTI last month, was seen in [facility] ED however per patient did not take any antibiotics.  Urine culture still shows quite less negative staph more than 100,000 colonies, per sensitivities placed on doxycycline for 7 days.  He received IV Rocephin through the hospitalization.    Mildly Elevated troponins with ST depression -Cardiology was consulted, seen by Dr. Royann Shivers, recommended cardiac troponin marginally abnormal likely not of true clinical significance and the current problems are not cardiac in origin.    Patient is alert and oriented and doing well, wants to go home.    Day of Discharge S: No acute issues, alert and oriented, no chest pain or shortness of breath, has pain in the right  great toe, states has surgery planned on Thursday at [facility].  BP (!) 120/91 (BP Location: Right Arm)   Pulse 80   Temp 97.9 F (36.6 C) (Oral)   Resp 20   Wt 54.8 kg (120 lb 13 oz)   SpO2 96%   BMI 17.33 kg/m   Physical Exam: General: Alert and awake oriented x3 not in any acute distress. HEENT: anicteric sclera, pupils reactive to light and accommodation CVS: S1-S2 clear no murmur rubs or gallops Chest: clear to auscultation bilaterally, no wheezing rales or rhonchi Abdomen: soft nontender, nondistended, normal bowel sounds Extremities: no cyanosis, clubbing or edema noted bilaterally Neuro: Cranial nerves II-XII intact, no focal neurological deficits   The results of significant diagnostics from this hospitalization (including imaging, microbiology, ancillary and laboratory) are listed below for reference.      Procedures/Studies:  Ct Head Wo Contrast  Result Date: 10/05/2017 CLINICAL DATA:  Altered mental status and lethargy. EXAM: CT HEAD WITHOUT CONTRAST TECHNIQUE: Contiguous axial images were obtained from the base of the skull through the vertex without intravenous contrast. COMPARISON:  10/11/2009 FINDINGS: BRAIN: There is sulcal and ventricular  prominence consistent with superficial and central atrophy. No intraparenchymal hemorrhage, mass effect nor midline shift. Periventricular and subcortical white matter hypodensities consistent with chronic small vessel ischemic disease are identified. No acute large vascular territory infarcts. No abnormal extra-axial fluid collections. Basal cisterns are not effaced and midline. VASCULAR: 10/11/2009 SKULL: No skull fracture. No significant scalp soft tissue swelling. SINUSES/ORBITS: The mastoid air-cells are clear. The included paranasal sinuses are well-aerated.The included ocular globes and orbital contents are non-suspicious. OTHER: None. IMPRESSION: Diffuse small vessel ischemic disease, chronic in appearance. Atrophy. No acute  intracranial abnormality. Electronically Signed   By: Tollie Eth M.D.   On: 10/05/2017 22:09   Dg Hand 2 View Right  Result Date: 10/07/2017 CLINICAL DATA:  Right thumb pain EXAM: RIGHT HAND - 2 VIEW COMPARISON:  None. FINDINGS: No fracture or dislocation is seen. Moderate degenerative changes of the 1st CMC joint. The visualized soft tissues are unremarkable. IMPRESSION: No acute osseus abnormality is seen. Moderate degenerative changes of the 1st CMC joint. Electronically Signed   By: Charline Bills M.D.   On: 10/07/2017 17:25   Dg Chest Port 1 View  Result Date: 10/05/2017 CLINICAL DATA:  Short of breath EXAM: PORTABLE CHEST 1 VIEW COMPARISON:  04/20/2015 FINDINGS: Decreased lung volumes. Elevation of the left diaphragm. No acute airspace disease or effusion. Stable cardiomediastinal silhouette with aortic atherosclerosis. No pneumothorax. IMPRESSION: No active disease. Electronically Signed   By: Jasmine Pang M.D.   On: 10/05/2017 20:22       LAB RESULTS: Basic Metabolic Panel: Recent Labs  Lab 10/06/17 0012 10/06/17 0342 10/07/17 0823  NA  --  148* 144  K  --  3.3* 3.3*  CL  --  117* 115*  CO2  --  21* 19*  GLUCOSE  --  107* 111*  BUN  --  20 14  CREATININE  --  1.15 1.10  CALCIUM  --  8.7* 8.7*  MG 2.1  --   --   PHOS 3.1  --   --    Liver Function Tests: Recent Labs  Lab 10/05/17 2012  AST 21  ALT 13*  ALKPHOS 111  BILITOT 0.8  PROT 6.8  ALBUMIN 3.8   No results for input(s): LIPASE, AMYLASE in the last 168 hours. Recent Labs  Lab 10/05/17 2012 10/06/17 1623  AMMONIA 17 13   CBC: Recent Labs  Lab 10/06/17 0342 10/07/17 0823  WBC 9.8 9.6  NEUTROABS  --  7.3  HGB 12.7* 12.1*  HCT 40.0 38.0*  MCV 90.3 90.7  PLT 304 310   Cardiac Enzymes: Recent Labs  Lab 10/05/17 2012 10/06/17 0012  TROPONINI 0.03* 0.04*   BNP: Invalid input(s): POCBNP CBG: No results for input(s): GLUCAP in the last 168 hours.    Disposition and  Follow-up: Discharge Instructions    Diet - low sodium heart healthy   Complete by:  As directed    Increase activity slowly   Complete by:  As directed        DISPOSITION: Home   DISCHARGE FOLLOW-UP: With [place] PCP and vascular surgery    Time coordinating discharge:  35 minutes  Signed:   Thad Ranger M.D. [facility] 10/08/2017, 12:43 PM Pager: [phone]

## 2017-10-10 LAB — CULTURE, BLOOD (ROUTINE X 2)
CULTURE: NO GROWTH
Culture: NO GROWTH
SPECIAL REQUESTS: ADEQUATE

## 2017-10-23 ENCOUNTER — Emergency Department (HOSPITAL_COMMUNITY): Payer: Medicare Other

## 2017-10-23 ENCOUNTER — Encounter (HOSPITAL_COMMUNITY): Payer: Self-pay | Admitting: Emergency Medicine

## 2017-10-23 ENCOUNTER — Emergency Department (HOSPITAL_COMMUNITY)
Admission: EM | Admit: 2017-10-23 | Discharge: 2017-10-24 | Disposition: A | Discharge status: Home / Self Care | Payer: Medicare Other | Attending: Emergency Medicine | Admitting: Emergency Medicine

## 2017-10-23 DIAGNOSIS — Z79899 Other long term (current) drug therapy: Secondary | ICD-10-CM | POA: Insufficient documentation

## 2017-10-23 DIAGNOSIS — I10 Essential (primary) hypertension: Secondary | ICD-10-CM | POA: Diagnosis not present

## 2017-10-23 DIAGNOSIS — R112 Nausea with vomiting, unspecified: Secondary | ICD-10-CM | POA: Diagnosis not present

## 2017-10-23 DIAGNOSIS — Z720 Tobacco use: Secondary | ICD-10-CM | POA: Insufficient documentation

## 2017-10-23 DIAGNOSIS — Z7982 Long term (current) use of aspirin: Secondary | ICD-10-CM | POA: Insufficient documentation

## 2017-10-23 DIAGNOSIS — R6883 Chills (without fever): Secondary | ICD-10-CM | POA: Diagnosis not present

## 2017-10-23 DIAGNOSIS — I2510 Atherosclerotic heart disease of native coronary artery without angina pectoris: Secondary | ICD-10-CM | POA: Insufficient documentation

## 2017-10-23 DIAGNOSIS — R1032 Left lower quadrant pain: Secondary | ICD-10-CM | POA: Insufficient documentation

## 2017-10-23 DIAGNOSIS — R197 Diarrhea, unspecified: Secondary | ICD-10-CM | POA: Diagnosis not present

## 2017-10-23 DIAGNOSIS — D72829 Elevated white blood cell count, unspecified: Secondary | ICD-10-CM | POA: Diagnosis not present

## 2017-10-23 DIAGNOSIS — R109 Unspecified abdominal pain: Secondary | ICD-10-CM

## 2017-10-23 LAB — URINALYSIS, ROUTINE W REFLEX MICROSCOPIC
BACTERIA UA: NONE SEEN
Bilirubin Urine: NEGATIVE
Glucose, UA: NEGATIVE mg/dL
Hgb urine dipstick: NEGATIVE
Ketones, ur: NEGATIVE mg/dL
Leukocytes, UA: NEGATIVE
Nitrite: NEGATIVE
PROTEIN: 30 mg/dL — AB
Specific Gravity, Urine: 1.008 (ref 1.005–1.030)
pH: 8 (ref 5.0–8.0)

## 2017-10-23 LAB — CBC WITH DIFFERENTIAL/PLATELET
Basophils Absolute: 0 10*3/uL (ref 0.0–0.1)
Basophils Relative: 0 %
EOS ABS: 0 10*3/uL (ref 0.0–0.7)
Eosinophils Relative: 0 %
HCT: 43.3 % (ref 39.0–52.0)
Hemoglobin: 14 g/dL (ref 13.0–17.0)
Lymphocytes Relative: 7 %
Lymphs Abs: 1.1 10*3/uL (ref 0.7–4.0)
MCH: 29.2 pg (ref 26.0–34.0)
MCHC: 32.3 g/dL (ref 30.0–36.0)
MCV: 90.2 fL (ref 78.0–100.0)
MONO ABS: 1 10*3/uL (ref 0.1–1.0)
MONOS PCT: 6 %
Neutro Abs: 15.1 10*3/uL — ABNORMAL HIGH (ref 1.7–7.7)
Neutrophils Relative %: 87 %
Platelets: 324 10*3/uL (ref 150–400)
RBC: 4.8 MIL/uL (ref 4.22–5.81)
RDW: 16.4 % — AB (ref 11.5–15.5)
WBC: 17.3 10*3/uL — ABNORMAL HIGH (ref 4.0–10.5)

## 2017-10-23 LAB — PROTIME-INR
INR: 0.92
Prothrombin Time: 12.3 seconds (ref 11.4–15.2)

## 2017-10-23 LAB — COMPREHENSIVE METABOLIC PANEL
ALT: 11 U/L — ABNORMAL LOW (ref 17–63)
ANION GAP: 13 (ref 5–15)
AST: 24 U/L (ref 15–41)
Albumin: 3.4 g/dL — ABNORMAL LOW (ref 3.5–5.0)
Alkaline Phosphatase: 84 U/L (ref 38–126)
BUN: 13 mg/dL (ref 6–20)
CO2: 23 mmol/L (ref 22–32)
Calcium: 9.2 mg/dL (ref 8.9–10.3)
Chloride: 107 mmol/L (ref 101–111)
Creatinine, Ser: 1.06 mg/dL (ref 0.61–1.24)
GFR calc Af Amer: >60 mL/min (ref >60)
GFR calc non Af Amer: >60 mL/min (ref >60)
GLUCOSE: 107 mg/dL — AB (ref 65–99)
POTASSIUM: 3 mmol/L — AB (ref 3.5–5.1)
SODIUM: 143 mmol/L (ref 135–145)
Total Bilirubin: 0.9 mg/dL (ref 0.3–1.2)
Total Protein: 7.2 g/dL (ref 6.5–8.1)

## 2017-10-23 LAB — LIPASE, BLOOD: Lipase: 27 U/L (ref 11–51)

## 2017-10-23 LAB — I-STAT CG4 LACTIC ACID, ED: Lactic Acid, Venous: 1.62 mmol/L (ref 0.5–1.9)

## 2017-10-23 MED ORDER — ONDANSETRON HCL 4 MG/2ML IJ SOLN
4.0000 mg | Freq: Once | INTRAMUSCULAR | Status: AC
  Administered 2017-10-23: 4 mg via INTRAVENOUS
  Filled 2017-10-23: qty 2

## 2017-10-23 MED ORDER — SODIUM CHLORIDE 0.9 % IV BOLUS
1000.0000 mL | Freq: Once | INTRAVENOUS | Status: AC
  Administered 2017-10-23: 1000 mL via INTRAVENOUS

## 2017-10-23 MED ORDER — GI COCKTAIL ~~LOC~~
30.0000 mL | Freq: Once | ORAL | Status: AC
  Administered 2017-10-23: 30 mL via ORAL
  Filled 2017-10-23: qty 30

## 2017-10-23 MED ORDER — IOPAMIDOL (ISOVUE-300) INJECTION 61%
100.0000 mL | Freq: Once | INTRAVENOUS | Status: AC | PRN
  Administered 2017-10-23: 100 mL via INTRAVENOUS

## 2017-10-23 MED ORDER — IOPAMIDOL (ISOVUE-300) INJECTION 61%: 30.0000 mL | Freq: Once | INTRAVENOUS | Status: DC

## 2017-10-23 MED ORDER — FENTANYL CITRATE (PF) 100 MCG/2ML IJ SOLN
50.0000 ug | Freq: Once | INTRAMUSCULAR | Status: AC
  Administered 2017-10-23: 50 ug via INTRAVENOUS
  Filled 2017-10-23: qty 2

## 2017-10-23 MED ORDER — IOPAMIDOL (ISOVUE-300) INJECTION 61%
INTRAVENOUS | Status: AC
  Filled 2017-10-23: qty 100

## 2017-10-23 NOTE — ED Triage Notes (Signed)
Pt verbalizes he thinks some one is poisoning him. C/o of stomach pain, and nausea and bad stomach for past couple weeks. Went away and then came back.  Heart burn issues as well.

## 2017-10-23 NOTE — ED Notes (Signed)
Bed: WA20 Expected date:  Expected time:  Means of arrival:  Comments: EMS- abdominal pain 

## 2017-10-23 NOTE — ED Provider Notes (Signed)
[facility]-EMERGENCY DEPT Provider Note   CSN: [id number] Arrival date & time: 10/23/17  4098     History   Chief Complaint Chief Complaint  Patient presents with  . Nausea  . Emesis  . Diarrhea    HPI Ray WINTERHALTER is a 72 y.o. male.  The history is provided by the patient and medical records. No language interpreter was used.  Emesis   This is a new problem. The current episode started more than 2 days ago. The problem occurs continuously. The problem has not changed since onset.Associated symptoms include abdominal pain, chills and diarrhea. Pertinent negatives include no cough, no fever and no URI.  Diarrhea   This is a new problem. The current episode started more than 2 days ago. The problem occurs continuously. The problem has not changed since onset.The stool consistency is described as watery. Associated symptoms include abdominal pain, vomiting and chills. Pertinent negatives include no URI and no cough.    Past Medical History:  Diagnosis Date  . Coronary artery disease    a.  presented with CP and inf STE but no ACS - LHC (10/23/13):  Dist LM 40-60%, ostial LAD 80%, mid LAD 50%, ostial CFX 50-70%, mid RCA 50%.  EF 60%. - CP not felt to be ischemic; CABG vs Med Rx d/w pt - pt opted for Med Rx  . Hyperlipidemia   . Hypertension     Patient Active Problem List   Diagnosis Date Noted  . Acute encephalopathy 10/06/2017  . AKI (acute kidney injury) (HCC)   . Altered mental status   . Hypertension 11/12/2013  . Dyslipidemia 10/24/2013  . ACS (acute coronary syndrome) (HCC) 10/23/2013  . Chest pain 10/23/2013  . Coronary Artery Disease 10/23/2013  . COPD (chronic obstructive pulmonary disease) (HCC) 10/23/2013  . Tobacco abuse 10/23/2013  . Precordial pain 10/23/2013    Past Surgical History:  Procedure Laterality Date  . LEFT HEART CATHETERIZATION WITH CORONARY ANGIOGRAM Bilateral 10/23/2013   Procedure: LEFT HEART CATHETERIZATION WITH  CORONARY ANGIOGRAM;  Surgeon: Lesleigh Noe, MD;  Location: [facility] CATH LAB;  Service: Cardiovascular;  Laterality: Bilateral;  . snake bite surgery          Home Medications    Prior to Admission medications   Medication Sig Start Date End Date Taking? Authorizing Provider  albuterol (PROVENTIL HFA;VENTOLIN HFA) 108 (90 Base) MCG/ACT inhaler Inhale 2 puffs into the lungs every 6 (six) hours as needed for wheezing. 03/14/15   [provider]  aspirin 325 MG EC tablet Take 1 tablet (325 mg total) by mouth daily. 10/08/17   Rai, Delene Ruffini, MD  bismuth subsalicylate (PEPTO BISMOL) 262 MG/15ML suspension Take 30 mLs by mouth as needed.    [provider]  docusate sodium (COLACE) 100 MG capsule Take 200 mg by mouth as needed for mild constipation.    [provider]  doxycycline (VIBRA-TABS) 100 MG tablet Take 1 tablet (100 mg total) by mouth 2 (two) times daily. X 7 days 10/08/17   Rai, Delene Ruffini, MD  lovastatin (MEVACOR) 20 MG tablet Take 1 tablet (20 mg total) by mouth at bedtime. 10/08/17   Rai, Delene Ruffini, MD  naproxen sodium (ALEVE) 220 MG tablet Take 440 mg by mouth as needed (pain).    [provider]  nitroGLYCERIN (NITROSTAT) 0.4 MG SL tablet Place under your tongue if you have severe chest pressure or tightness 10/25/13   Barrett, Joline Salt, PA-C  pantoprazole (PROTONIX)  40 MG tablet Take 1 tablet (40 mg total) by mouth daily. 04/21/15   Loren Racer, MD  Potassium 95 MG TABS Take 1 tablet by mouth daily.    [provider]  traMADol (ULTRAM) 50 MG tablet Take 1 tablet (50 mg total) by mouth every 6 (six) hours as needed. 10/08/17   Rai, Delene Ruffini, MD  vitamin C (ASCORBIC ACID) 500 MG tablet Take 500 mg by mouth daily.    [provider]    Family History No family history on file.  Social History Social History   Tobacco Use  . Smoking status: Current Some Day Smoker  Substance Use Topics  . Alcohol use: No  . Drug use:  No     Allergies   Tramadol; Flexeril [cyclobenzaprine]; Ibuprofen; and Lactose intolerance (gi)   Review of Systems Review of Systems  Constitutional: Positive for chills and fatigue. Negative for diaphoresis and fever.  HENT: Negative for congestion.   Eyes: Negative for visual disturbance.  Respiratory: Negative for cough, chest tightness and shortness of breath.   Cardiovascular: Negative for chest pain, palpitations and leg swelling.  Gastrointestinal: Positive for abdominal pain, diarrhea and vomiting. Negative for abdominal distention and constipation.  Genitourinary: Positive for frequency. Negative for dysuria and flank pain.  Musculoskeletal: Negative for back pain, neck pain and neck stiffness.  Skin: Positive for color change (briosing) and wound.  Neurological: Negative for light-headedness.  Psychiatric/Behavioral: Negative for agitation.  All other systems reviewed and are negative.    Physical Exam Updated Vital Signs BP (!) 155/106 (BP Location: Left Arm) Comment: taken 2X  Pulse 84   Temp 97.9 F (36.6 C) (Oral)   Resp 16   SpO2 98%   Physical Exam  Constitutional: He is oriented to person, place, and time. He appears well-developed and well-nourished. No distress.  HENT:  Head: Normocephalic.  Nose: Nose normal.  Mouth/Throat: Oropharynx is clear and moist. No oropharyngeal exudate.  Eyes: Pupils are equal, round, and reactive to light. Conjunctivae and EOM are normal.  Neck: Normal range of motion. Neck supple.  Cardiovascular: Normal rate and intact distal pulses.  No murmur heard. Pulmonary/Chest: Effort normal. No respiratory distress. He has no wheezes. He has no rales. He exhibits no tenderness.  Abdominal: Bowel sounds are normal. He exhibits no distension. There is generalized tenderness. There is no rigidity, no rebound, no guarding and no CVA tenderness.    Musculoskeletal: He exhibits tenderness. He exhibits no edema.       Left hip: He  exhibits tenderness.  Patient had palpable DP and PT pulse in the right lower extremity and had Doppler found pulses in the left DP and PT arteries.  Normal strength and sensation in both legs.  Normal capillary refill bilaterally.  No tenderness or pain in legs.   Neurological: He is alert and oriented to person, place, and time. No sensory deficit. He exhibits normal muscle tone.  Skin: Capillary refill takes less than 2 seconds. He is not diaphoretic. No erythema. No pallor.  Nursing note and vitals reviewed.    ED Treatments / Results  Labs (all labs ordered are listed, but only abnormal results are displayed) Labs Reviewed  CBC WITH DIFFERENTIAL/PLATELET - Abnormal; Notable for the following components:      Result Value   WBC 17.3 (*)    RDW 16.4 (*)    Neutro Abs 15.1 (*)    All other components within normal limits  COMPREHENSIVE METABOLIC PANEL - Abnormal; Notable for  the following components:   Potassium 3.0 (*)    Glucose, Bld 107 (*)    Albumin 3.4 (*)    ALT 11 (*)    All other components within normal limits  URINALYSIS, ROUTINE W REFLEX MICROSCOPIC - Abnormal; Notable for the following components:   Protein, ur 30 (*)    All other components within normal limits  URINE CULTURE  LIPASE, BLOOD  PROTIME-INR  I-STAT CG4 LACTIC ACID, ED    EKG EKG Interpretation  Date/Time:  Tuesday October 23 2017 21:29:19 EDT Ventricular Rate:  106 PR Interval:    QRS Duration: 88 QT Interval:  311 QTC Calculation: 413 R Axis:   41 Text Interpretation:  Sinus tachycardia Biatrial enlargement Repol abnrm suggests ischemia, diffuse leads When compared to prior, similar ST abnormalities.  No STEMI Confirmed by Theda Belfast ([id number]) on 10/23/2017 10:08:52 PM   Radiology Ct Abdomen Pelvis W Contrast  Result Date: 10/23/2017 CLINICAL DATA:  Severe diffuse abdominal pain, nausea/vomiting, diarrhea. EXAM: CT ABDOMEN AND PELVIS WITH CONTRAST TECHNIQUE: Multidetector CT imaging of the  abdomen and pelvis was performed using the standard protocol following bolus administration of intravenous contrast. CONTRAST:  ISOVUE-300 IOPAMIDOL (ISOVUE-300) INJECTION 61% COMPARISON:  Correlation with prior CT chest dated 10/23/2013. FINDINGS: Lower chest: Lung bases are clear. Hepatobiliary: Scattered hepatic cysts measuring up to 2.5 cm in segment 2. Layering gallstones and/or gallbladder sludge (series 2/image 35), without associated inflammatory changes. No intrahepatic or extrahepatic ductal dilatation. Pancreas: Within normal limits. Spleen: Within normal limits. Adrenals/Urinary Tract: Adrenal glands are within normal limits. Left renal cortical scarring/atrophy. Bilateral renal cysts, measuring up to 2.0 cm in the anterior left upper kidney. No enhancing renal lesions. No hydronephrosis. 2 mm layering right bladder calculus (series 2/image 69). Bladder is otherwise within normal limits. Stomach/Bowel: Stomach is within normal limits. No evidence of bowel obstruction. Normal appendix (series 2/image 48). Left colonic diverticulosis, without evidence of diverticulitis. Vascular/Lymphatic: 3.2 x 2.2 x 4.2 cm pseudoaneurysm along the right lateral aspect of the descending thoracic aorta at the aortic hiatus (coronal image 44), likely related to an underlying penetrating atherosclerotic ulcer. Overall appearance is progressive from 2015 CT chest. Atherosclerotic calcifications of the abdominal aorta and branch vessels. Mild atherosclerotic plaque involving the SMA (series 2/image 24), although this remains patent. No suspicious abdominopelvic lymphadenopathy. Reproductive: Prostatomegaly. Other: Trace pelvic ascites. Musculoskeletal: Visualized osseous structures are within normal limits. IMPRESSION: No CT findings to account for the patient's abdominal pain. Layering gallstones and/or gallbladder sludge, without associated inflammatory changes. 4.2 cm pseudoaneurysm along the right lateral aspect of  the sending thoracic aorta at the aortic hiatus, related to an underlying penetrating atherosclerotic ulcer, progressive from 2015 CT chest. Additional ancillary findings as above. Electronically Signed   By: Charline Bills M.D.   On: 10/23/2017 23:54   Dg Chest Port 1 View  Result Date: 10/23/2017 CLINICAL DATA:  Abdominal pain EXAM: PORTABLE CHEST 1 VIEW COMPARISON:  10/05/2017 FINDINGS: Lungs are clear.  No pleural effusion or pneumothorax. The heart is normal in size. IMPRESSION: No evidence of acute cardiopulmonary disease. Electronically Signed   By: Charline Bills M.D.   On: 10/23/2017 22:35    Procedures Procedures (including critical care time)  Medications Ordered in ED Medications  iopamidol (ISOVUE-300) 61 % injection 30 mL ( Oral Canceled Entry 10/23/17 2115)  iopamidol (ISOVUE-300) 61 % injection (has no administration in time range)  sodium chloride 0.9 % bolus 1,000 mL (0 mLs Intravenous Stopped 10/23/17 2305)  ondansetron (ZOFRAN)  injection 4 mg (4 mg Intravenous Given 10/23/17 2138)  fentaNYL (SUBLIMAZE) injection 50 mcg (50 mcg Intravenous Given 10/23/17 2138)  gi cocktail (Maalox,Lidocaine,Donnatal) (30 mLs Oral Given 10/23/17 2123)  iopamidol (ISOVUE-300) 61 % injection 100 mL (100 mLs Intravenous Contrast Given 10/23/17 2318)  potassium chloride SA (K-DUR,KLOR-CON) CR tablet 40 mEq (40 mEq Oral Given 10/24/17 0022)     Initial Impression / Assessment and Plan / ED Course  I have reviewed the triage vital signs and the nursing notes.  Pertinent labs & imaging results that were available during my care of the patient were reviewed by me and considered in my medical decision making (see chart for details).     DAUSON Smith is a 72 y.o. male with a past medical history significant for COPD, CAD, hypertension, hyperlipidemia, recent admission for encephalitis/UTI and peripheral vascular disease status post left femoral access and right , femoral artery ballooning who  presents with nausea, vomiting, diarrhea, diffuse abdominal pain, and pain over his left femoral access site.  Patient reports that he has been having fatigue over the last 2 weeks since his femoral artery ballooning procedure and he says his legs have been doing well since then.  He reports that he has developed some pain over the left femoral access site in the left lower quadrant of his abdomen.  He reports he had some chills but no fevers.  He reports that over the last few days he has had nausea, vomiting, diarrhea.  He has had diffuse abdominal pain.  He reports urinary frequency has returned.  He denies any cough, congestion, chest pain, or shortness of breath.  He denies any other changes or complaints.  Patient was noted to have bedbugs on my initial evaluation.    On exam, lungs are clear.  Chest is nontender.  Abdomen is diffusely tender and the patient's wound was inspected in the left lower quadrant/inguinal area.  There was an area of tenderness but no fluctuance, erythema, or crepitance present.  There was some diffuse bruising in this location.  GU exam unremarkable.  Patient had palpable pulses in the right lower extremity however his left had dopplerable pulses.  Patient denies any pain numbness or swelling in the legs.  He is primarily concerned about the GI symptoms and abdominal pain.    Patient will have laboratory testing as well as fluids since he has not been having p.o. intake over the last few days with his symptoms.  Patient will be given a small amount of pain medication.  Patient will have CT scan to look for postoperative infection or other complication near the surgical site as well as to look for other abnormalities in his abdomen.  Anticipate reassessment after work-up.  12:27 AM After medications and fluids, patient was feeling much better.  No more nausea or vomiting.  Patient was able to tolerate eating and drinking without difficulty.  Patient's lactic acid not elevated.   Leukocytosis is present however may be due to the margination in the setting of his nausea vomiting and diarrhea.  Hemoglobin was normal.  Metabolic panel showed hypokalemia which he reports he has had in the past.  He reports taking potassium but has not been able to do so over the last few days with his nausea and vomiting.  This will be supplemented here.  Creatinine reassuring and liver function normal.  Lipase was not elevated.  Urinalysis shows no evidence of infection.  Next  CT scan was obtained due to the  tenderness.  CT results showed worsening of his known aortic dilation however when compared to care everywhere his recent imaging it has been improved.  No evidence of acute bacterial infection seen.  No evidence of obstruction or other postsurgical problem.  Given patient's reassessment with improved symptoms, suspect patient is a viral gastroenteritis causing his nausea, vomiting, diarrhea, and abdominal pain.  Patient will give prescription for nausea medicine and pain medicine as this has significant improved his symptoms and is able to now tolerate p.o.  I feel patient can maintain hydration at discharge and patient will follow-up with PCP in several days for reassessment.  Do not feel patient needs admission at this time and patient understands return precautions for new or worsened symptoms.  Patient discharged in good condition with improving symptoms.   Final Clinical Impressions(s) / ED Diagnoses   Final diagnoses:  Nausea vomiting and diarrhea  Abdominal pain, unspecified abdominal location  Leukocytosis, unspecified type    ED Discharge Orders        Ordered    HYDROcodone-acetaminophen (NORCO/VICODIN) 5-325 MG tablet  Every 4 hours PRN     10/24/17 0023    ondansetron (ZOFRAN) 4 MG tablet  Every 8 hours PRN     10/24/17 0023      Clinical Impression: 1. Nausea vomiting and diarrhea   2. Chills   3. Abdominal pain, unspecified abdominal location   4. Leukocytosis,  unspecified type     Disposition: Discharge  Condition: Good  I have discussed the results, Dx and Tx plan with the pt(& family if present). He/she/they expressed understanding and agree(s) with the plan. Discharge instructions discussed at great length. Strict return precautions discussed and pt &/or family have verbalized understanding of the instructions. No further questions at time of discharge.    New Prescriptions   HYDROCODONE-ACETAMINOPHEN (NORCO/VICODIN) 5-325 MG TABLET    Take 1 tablet by mouth every 4 (four) hours as needed.   ONDANSETRON (ZOFRAN) 4 MG TABLET    Take 1 tablet (4 mg total) by mouth every 8 (eight) hours as needed for nausea or vomiting.    Follow Up: [facility] [address] [place] [address] [phone] Schedule an appointment as soon as possible for a visit    [facility]  HOSP[facility]-EMERGENCY DEPT [address] [id number] mc [place] [address] [phone]       , Canary Brim, MD 10/24/17 [phone]

## 2017-10-24 MED ORDER — POTASSIUM CHLORIDE CRYS ER 20 MEQ PO TBCR
40.0000 meq | EXTENDED_RELEASE_TABLET | Freq: Once | ORAL | Status: AC
  Administered 2017-10-24: 40 meq via ORAL
  Filled 2017-10-24: qty 2

## 2017-10-24 MED ORDER — ONDANSETRON HCL 4 MG PO TABS: 4.0000 mg | ORAL_TABLET | Freq: Three times a day (TID) | ORAL | 0 refills | Status: DC | PRN

## 2017-10-24 MED ORDER — HYDROCODONE-ACETAMINOPHEN 5-325 MG PO TABS
1.0000 | ORAL_TABLET | ORAL | 0 refills | Status: DC | PRN
Start: 2017-10-24 — End: 2018-01-02

## 2017-10-24 NOTE — Discharge Instructions (Signed)
Your work-up today showed no evidence of severe bacterial infection requiring need to be admitted to the hospital.  We did find your white blood cell count was slightly elevated and we think it is due to the nausea vomiting diarrhea you have been having.  Please use the pain medicine and nausea medicine to help with your symptoms and to maintain hydration.  Please follow-up with your primary doctor in several days for reassessment.  If any symptoms change or worsen, please return to the nearest emergency department.

## 2017-10-25 LAB — URINE CULTURE: Culture: NO GROWTH

## 2017-12-29 ENCOUNTER — Encounter (HOSPITAL_COMMUNITY): Payer: Self-pay | Admitting: Emergency Medicine

## 2017-12-29 ENCOUNTER — Inpatient Hospital Stay (HOSPITAL_COMMUNITY)
Admission: EM | Admit: 2017-12-29 | Discharge: 2018-01-02 | DRG: 246 | Disposition: A | Discharge status: Home / Self Care | Payer: Medicare Other | Attending: Cardiovascular Disease | Admitting: Cardiovascular Disease

## 2017-12-29 ENCOUNTER — Other Ambulatory Visit: Payer: Self-pay

## 2017-12-29 DIAGNOSIS — Z720 Tobacco use: Secondary | ICD-10-CM | POA: Diagnosis present

## 2017-12-29 DIAGNOSIS — F17210 Nicotine dependence, cigarettes, uncomplicated: Secondary | ICD-10-CM | POA: Diagnosis present

## 2017-12-29 DIAGNOSIS — E876 Hypokalemia: Secondary | ICD-10-CM | POA: Diagnosis present

## 2017-12-29 DIAGNOSIS — I5021 Acute systolic (congestive) heart failure: Secondary | ICD-10-CM | POA: Diagnosis present

## 2017-12-29 DIAGNOSIS — I2102 ST elevation (STEMI) myocardial infarction involving left anterior descending coronary artery: Principal | ICD-10-CM | POA: Diagnosis present

## 2017-12-29 DIAGNOSIS — I4510 Unspecified right bundle-branch block: Secondary | ICD-10-CM | POA: Diagnosis present

## 2017-12-29 DIAGNOSIS — E785 Hyperlipidemia, unspecified: Secondary | ICD-10-CM | POA: Diagnosis present

## 2017-12-29 DIAGNOSIS — I213 ST elevation (STEMI) myocardial infarction of unspecified site: Secondary | ICD-10-CM

## 2017-12-29 DIAGNOSIS — Z79899 Other long term (current) drug therapy: Secondary | ICD-10-CM

## 2017-12-29 DIAGNOSIS — I10 Essential (primary) hypertension: Secondary | ICD-10-CM | POA: Diagnosis present

## 2017-12-29 DIAGNOSIS — Z955 Presence of coronary angioplasty implant and graft: Secondary | ICD-10-CM

## 2017-12-29 DIAGNOSIS — Z886 Allergy status to analgesic agent status: Secondary | ICD-10-CM

## 2017-12-29 DIAGNOSIS — I5023 Acute on chronic systolic (congestive) heart failure: Secondary | ICD-10-CM

## 2017-12-29 DIAGNOSIS — Z888 Allergy status to other drugs, medicaments and biological substances status: Secondary | ICD-10-CM

## 2017-12-29 DIAGNOSIS — I110 Hypertensive heart disease with heart failure: Secondary | ICD-10-CM | POA: Diagnosis present

## 2017-12-29 DIAGNOSIS — Z7951 Long term (current) use of inhaled steroids: Secondary | ICD-10-CM

## 2017-12-29 DIAGNOSIS — Z9119 Patient's noncompliance with other medical treatment and regimen: Secondary | ICD-10-CM

## 2017-12-29 DIAGNOSIS — F09 Unspecified mental disorder due to known physiological condition: Secondary | ICD-10-CM | POA: Diagnosis present

## 2017-12-29 DIAGNOSIS — Z7982 Long term (current) use of aspirin: Secondary | ICD-10-CM

## 2017-12-29 DIAGNOSIS — I2510 Atherosclerotic heart disease of native coronary artery without angina pectoris: Secondary | ICD-10-CM | POA: Diagnosis present

## 2017-12-29 DIAGNOSIS — J449 Chronic obstructive pulmonary disease, unspecified: Secondary | ICD-10-CM | POA: Diagnosis present

## 2017-12-29 DIAGNOSIS — I255 Ischemic cardiomyopathy: Secondary | ICD-10-CM | POA: Diagnosis present

## 2017-12-29 MED ORDER — SODIUM CHLORIDE 0.9 % IV SOLN
INTRAVENOUS | Status: DC
  Administered 2017-12-30: via INTRAVENOUS

## 2017-12-29 MED ORDER — HEPARIN SODIUM (PORCINE) 5000 UNIT/ML IJ SOLN
60.0000 [IU]/kg | Freq: Once | INTRAMUSCULAR | Status: AC
  Administered 2017-12-30: 3300 [IU] via INTRAVENOUS
  Filled 2017-12-29: qty 1

## 2017-12-29 NOTE — ED Triage Notes (Signed)
The patient said he has been having chest pain for four months and it has been getting worse since four days ago.  At 1500 it got worse and he finally called EMS.  He took three nitroglycerins and Aspirin.  The patient is non-compliant with EMS, and non-compliant with EMS staff.  He refused an IV with EMS.  RN was able to get IV in the ED.  He is uncooperative with MD and ED staff.  Refused to answer any questions.

## 2017-12-30 ENCOUNTER — Emergency Department (HOSPITAL_COMMUNITY): Payer: Medicare Other

## 2017-12-30 ENCOUNTER — Inpatient Hospital Stay (HOSPITAL_COMMUNITY)
Admission: EM | Disposition: A | Discharge status: Home / Self Care | Payer: Self-pay | Source: Home / Self Care | Attending: Cardiovascular Disease

## 2017-12-30 ENCOUNTER — Inpatient Hospital Stay (HOSPITAL_COMMUNITY): Payer: Medicare Other

## 2017-12-30 DIAGNOSIS — I4510 Unspecified right bundle-branch block: Secondary | ICD-10-CM | POA: Diagnosis not present

## 2017-12-30 DIAGNOSIS — I2510 Atherosclerotic heart disease of native coronary artery without angina pectoris: Secondary | ICD-10-CM

## 2017-12-30 DIAGNOSIS — Z7951 Long term (current) use of inhaled steroids: Secondary | ICD-10-CM | POA: Diagnosis not present

## 2017-12-30 DIAGNOSIS — E876 Hypokalemia: Secondary | ICD-10-CM

## 2017-12-30 DIAGNOSIS — Z888 Allergy status to other drugs, medicaments and biological substances status: Secondary | ICD-10-CM | POA: Diagnosis not present

## 2017-12-30 DIAGNOSIS — F17210 Nicotine dependence, cigarettes, uncomplicated: Secondary | ICD-10-CM | POA: Diagnosis present

## 2017-12-30 DIAGNOSIS — I213 ST elevation (STEMI) myocardial infarction of unspecified site: Secondary | ICD-10-CM | POA: Diagnosis present

## 2017-12-30 DIAGNOSIS — E785 Hyperlipidemia, unspecified: Secondary | ICD-10-CM | POA: Diagnosis not present

## 2017-12-30 DIAGNOSIS — I2102 ST elevation (STEMI) myocardial infarction involving left anterior descending coronary artery: Secondary | ICD-10-CM

## 2017-12-30 DIAGNOSIS — Z886 Allergy status to analgesic agent status: Secondary | ICD-10-CM | POA: Diagnosis not present

## 2017-12-30 DIAGNOSIS — I5021 Acute systolic (congestive) heart failure: Secondary | ICD-10-CM | POA: Diagnosis not present

## 2017-12-30 DIAGNOSIS — J449 Chronic obstructive pulmonary disease, unspecified: Secondary | ICD-10-CM | POA: Diagnosis present

## 2017-12-30 DIAGNOSIS — Z79899 Other long term (current) drug therapy: Secondary | ICD-10-CM | POA: Diagnosis not present

## 2017-12-30 DIAGNOSIS — I214 Non-ST elevation (NSTEMI) myocardial infarction: Secondary | ICD-10-CM

## 2017-12-30 DIAGNOSIS — I255 Ischemic cardiomyopathy: Secondary | ICD-10-CM | POA: Diagnosis not present

## 2017-12-30 DIAGNOSIS — I110 Hypertensive heart disease with heart failure: Secondary | ICD-10-CM | POA: Diagnosis not present

## 2017-12-30 DIAGNOSIS — Z9119 Patient's noncompliance with other medical treatment and regimen: Secondary | ICD-10-CM | POA: Diagnosis not present

## 2017-12-30 DIAGNOSIS — Z7982 Long term (current) use of aspirin: Secondary | ICD-10-CM | POA: Diagnosis not present

## 2017-12-30 DIAGNOSIS — F09 Unspecified mental disorder due to known physiological condition: Secondary | ICD-10-CM | POA: Diagnosis present

## 2017-12-30 DIAGNOSIS — R079 Chest pain, unspecified: Secondary | ICD-10-CM | POA: Diagnosis not present

## 2017-12-30 HISTORY — PX: CORONARY/GRAFT ACUTE MI REVASCULARIZATION: CATH118305

## 2017-12-30 HISTORY — DX: ST elevation (STEMI) myocardial infarction involving left anterior descending coronary artery: I21.02

## 2017-12-30 HISTORY — DX: Acute systolic (congestive) heart failure: I50.21

## 2017-12-30 HISTORY — PX: LEFT HEART CATH AND CORONARY ANGIOGRAPHY: CATH118249

## 2017-12-30 LAB — HEMOGLOBIN A1C
Hgb A1c MFr Bld: 5.4 % (ref 4.8–5.6)
MEAN PLASMA GLUCOSE: 108.28 mg/dL

## 2017-12-30 LAB — COMPREHENSIVE METABOLIC PANEL
ALK PHOS: 74 U/L (ref 38–126)
ALT: 14 U/L (ref 0–44)
AST: 51 U/L — ABNORMAL HIGH (ref 15–41)
Albumin: 2.4 g/dL — ABNORMAL LOW (ref 3.5–5.0)
Anion gap: 11 (ref 5–15)
BUN: 19 mg/dL (ref 8–23)
CALCIUM: 8.7 mg/dL — AB (ref 8.9–10.3)
CO2: 20 mmol/L — ABNORMAL LOW (ref 22–32)
CREATININE: 1.29 mg/dL — AB (ref 0.61–1.24)
Chloride: 106 mmol/L (ref 98–111)
GFR calc non Af Amer: 53 mL/min — ABNORMAL LOW (ref >60)
Glucose, Bld: 111 mg/dL — ABNORMAL HIGH (ref 70–99)
Potassium: 3.4 mmol/L — ABNORMAL LOW (ref 3.5–5.1)
Sodium: 137 mmol/L (ref 135–145)
TOTAL PROTEIN: 5.3 g/dL — AB (ref 6.5–8.1)
Total Bilirubin: 0.7 mg/dL (ref 0.3–1.2)

## 2017-12-30 LAB — BASIC METABOLIC PANEL
Anion gap: 6 (ref 5–15)
Anion gap: 7 (ref 5–15)
BUN: 18 mg/dL (ref 8–23)
BUN: 17 mg/dL (ref 8–23)
CO2: 23 mmol/L (ref 22–32)
CO2: 21 mmol/L — ABNORMAL LOW (ref 22–32)
Calcium: 8.3 mg/dL — ABNORMAL LOW (ref 8.9–10.3)
Calcium: 8 mg/dL — ABNORMAL LOW (ref 8.9–10.3)
Chloride: 107 mmol/L (ref 98–111)
Chloride: 107 mmol/L (ref 98–111)
Creatinine, Ser: 1.26 mg/dL — ABNORMAL HIGH (ref 0.61–1.24)
Creatinine, Ser: 1.24 mg/dL (ref 0.61–1.24)
GFR calc Af Amer: >60 mL/min (ref >60)
GFR calc Af Amer: >60 mL/min (ref >60)
GFR calc non Af Amer: 55 mL/min — ABNORMAL LOW (ref >60)
GFR calc non Af Amer: 56 mL/min — ABNORMAL LOW (ref >60)
GLUCOSE: 120 mg/dL — AB (ref 70–99)
GLUCOSE: 136 mg/dL — AB (ref 70–99)
POTASSIUM: 3.3 mmol/L — AB (ref 3.5–5.1)
POTASSIUM: 3.2 mmol/L — AB (ref 3.5–5.1)
SODIUM: 135 mmol/L (ref 135–145)
Sodium: 136 mmol/L (ref 135–145)

## 2017-12-30 LAB — CBC WITH DIFFERENTIAL/PLATELET
Abs Immature Granulocytes: 0.1 10*3/uL (ref 0.0–0.1)
BASOS ABS: 0.1 10*3/uL (ref 0.0–0.1)
BASOS PCT: 1 %
Eosinophils Absolute: 0 10*3/uL (ref 0.0–0.7)
Eosinophils Relative: 0 %
HCT: 36.4 % — ABNORMAL LOW (ref 39.0–52.0)
Hemoglobin: 11.6 g/dL — ABNORMAL LOW (ref 13.0–17.0)
IMMATURE GRANULOCYTES: 1 %
Lymphocytes Relative: 5 %
Lymphs Abs: 0.5 10*3/uL — ABNORMAL LOW (ref 0.7–4.0)
MCH: 28.3 pg (ref 26.0–34.0)
MCHC: 31.9 g/dL (ref 30.0–36.0)
MCV: 88.8 fL (ref 78.0–100.0)
MONO ABS: 0.8 10*3/uL (ref 0.1–1.0)
MONOS PCT: 8 %
NEUTROS PCT: 85 %
Neutro Abs: 8.2 10*3/uL — ABNORMAL HIGH (ref 1.7–7.7)
PLATELETS: 339 10*3/uL (ref 150–400)
RBC: 4.1 MIL/uL — ABNORMAL LOW (ref 4.22–5.81)
RDW: 14.2 % (ref 11.5–15.5)
WBC: 9.6 10*3/uL (ref 4.0–10.5)

## 2017-12-30 LAB — CBC
HCT: 38.2 % — ABNORMAL LOW (ref 39.0–52.0)
HEMOGLOBIN: 12.1 g/dL — AB (ref 13.0–17.0)
MCH: 27.8 pg (ref 26.0–34.0)
MCHC: 31.7 g/dL (ref 30.0–36.0)
MCV: 87.8 fL (ref 78.0–100.0)
Platelets: 396 10*3/uL (ref 150–400)
RBC: 4.35 MIL/uL (ref 4.22–5.81)
RDW: 14.3 % (ref 11.5–15.5)
WBC: 9.3 10*3/uL (ref 4.0–10.5)

## 2017-12-30 LAB — LIPID PANEL
CHOLESTEROL: 169 mg/dL (ref 0–200)
Cholesterol: 185 mg/dL (ref 0–200)
HDL: 31 mg/dL — ABNORMAL LOW (ref >40)
HDL: 34 mg/dL — AB (ref >40)
LDL Cholesterol: 114 mg/dL — ABNORMAL HIGH (ref 0–99)
LDL Cholesterol: 130 mg/dL — ABNORMAL HIGH (ref 0–99)
TRIGLYCERIDES: 103 mg/dL (ref <150)
Total CHOL/HDL Ratio: 5.5 RATIO
Total CHOL/HDL Ratio: 5.4 RATIO
Triglycerides: 119 mg/dL (ref <150)
VLDL: 21 mg/dL (ref 0–40)
VLDL: 24 mg/dL (ref 0–40)

## 2017-12-30 LAB — BRAIN NATRIURETIC PEPTIDE
B NATRIURETIC PEPTIDE 5: 1841.1 pg/mL — AB (ref 0.0–100.0)
B NATRIURETIC PEPTIDE 5: 2345.7 pg/mL — AB (ref 0.0–100.0)

## 2017-12-30 LAB — MRSA PCR SCREENING: MRSA by PCR: NEGATIVE

## 2017-12-30 LAB — APTT: aPTT: 34 seconds (ref 24–36)

## 2017-12-30 LAB — TROPONIN I
TROPONIN I: 4.92 ng/mL — AB (ref <0.03)
Troponin I: >65 ng/mL (ref <0.03)
Troponin I: >65 ng/mL (ref <0.03)

## 2017-12-30 LAB — PROTIME-INR
INR: 1.03
Prothrombin Time: 13.4 seconds (ref 11.4–15.2)

## 2017-12-30 SURGERY — CORONARY/GRAFT ACUTE MI REVASCULARIZATION
Anesthesia: LOCAL

## 2017-12-30 MED ORDER — SODIUM CHLORIDE 0.9 % WEIGHT BASED INFUSION
1.0000 mL/kg/h | INTRAVENOUS | Status: AC
  Administered 2017-12-30: 1 mL/kg/h via INTRAVENOUS

## 2017-12-30 MED ORDER — TIROFIBAN (AGGRASTAT) BOLUS VIA INFUSION
INTRAVENOUS | Status: DC | PRN
  Administered 2017-12-30: 1375 ug via INTRAVENOUS

## 2017-12-30 MED ORDER — CLOPIDOGREL BISULFATE 300 MG PO TABS
ORAL_TABLET | ORAL | Status: AC
  Filled 2017-12-30: qty 2

## 2017-12-30 MED ORDER — CLOPIDOGREL BISULFATE 300 MG PO TABS
ORAL_TABLET | ORAL | Status: DC | PRN
  Administered 2017-12-30: 600 mg via ORAL

## 2017-12-30 MED ORDER — CARVEDILOL 3.125 MG PO TABS
3.1250 mg | ORAL_TABLET | Freq: Two times a day (BID) | ORAL | Status: DC
  Administered 2017-12-30 – 2018-01-02 (×7): 3.125 mg via ORAL
  Filled 2017-12-30 (×7): qty 1

## 2017-12-30 MED ORDER — MIDAZOLAM HCL 2 MG/2ML IJ SOLN
INTRAMUSCULAR | Status: DC | PRN
  Administered 2017-12-30: 2 mg via INTRAVENOUS
  Administered 2017-12-30: 1 mg via INTRAVENOUS

## 2017-12-30 MED ORDER — FENTANYL CITRATE (PF) 100 MCG/2ML IJ SOLN
INTRAMUSCULAR | Status: AC
  Filled 2017-12-30: qty 2

## 2017-12-30 MED ORDER — TIROFIBAN HCL IV 12.5 MG/250 ML
INTRAVENOUS | Status: AC | PRN
  Administered 2017-12-30: 0.15 ug/kg/min via INTRAVENOUS

## 2017-12-30 MED ORDER — POTASSIUM CHLORIDE CRYS ER 20 MEQ PO TBCR
40.0000 meq | EXTENDED_RELEASE_TABLET | Freq: Once | ORAL | Status: AC
  Administered 2017-12-30: 40 meq via ORAL
  Filled 2017-12-30: qty 2

## 2017-12-30 MED ORDER — CLOPIDOGREL BISULFATE 75 MG PO TABS
75.0000 mg | ORAL_TABLET | Freq: Every day | ORAL | Status: DC
  Administered 2017-12-30 – 2018-01-02 (×4): 75 mg via ORAL
  Filled 2017-12-30 (×4): qty 1

## 2017-12-30 MED ORDER — VERAPAMIL HCL 2.5 MG/ML IV SOLN
INTRAVENOUS | Status: DC | PRN
  Administered 2017-12-30: 10 mL via INTRA_ARTERIAL

## 2017-12-30 MED ORDER — MIDAZOLAM HCL 2 MG/2ML IJ SOLN
INTRAMUSCULAR | Status: AC
  Filled 2017-12-30: qty 2

## 2017-12-30 MED ORDER — SODIUM CHLORIDE 0.9 % IV SOLN
INTRAVENOUS | Status: DC
Start: 2017-12-30 — End: 2018-01-02

## 2017-12-30 MED ORDER — ASPIRIN EC 81 MG PO TBEC
81.0000 mg | DELAYED_RELEASE_TABLET | Freq: Every day | ORAL | Status: DC
  Administered 2017-12-31 – 2018-01-02 (×3): 81 mg via ORAL
  Filled 2017-12-30 (×3): qty 1

## 2017-12-30 MED ORDER — ENSURE ENLIVE PO LIQD
237.0000 mL | Freq: Two times a day (BID) | ORAL | Status: DC
  Administered 2017-12-31: 237 mL via ORAL

## 2017-12-30 MED ORDER — ACETAMINOPHEN 325 MG PO TABS
650.0000 mg | ORAL_TABLET | ORAL | Status: DC | PRN
  Filled 2017-12-30: qty 2

## 2017-12-30 MED ORDER — NITROGLYCERIN IN D5W 200-5 MCG/ML-% IV SOLN: 0.0000 ug/min | Freq: Once | INTRAVENOUS | Status: DC

## 2017-12-30 MED ORDER — OXYCODONE-ACETAMINOPHEN 5-325 MG PO TABS
2.0000 | ORAL_TABLET | Freq: Four times a day (QID) | ORAL | Status: DC | PRN
  Administered 2017-12-30 – 2018-01-01 (×5): 2 via ORAL
  Filled 2017-12-30 (×5): qty 2

## 2017-12-30 MED ORDER — HEPARIN SODIUM (PORCINE) 1000 UNIT/ML IJ SOLN
INTRAMUSCULAR | Status: AC
  Filled 2017-12-30: qty 1

## 2017-12-30 MED ORDER — ORAL CARE MOUTH RINSE
15.0000 mL | Freq: Two times a day (BID) | OROMUCOSAL | Status: DC
  Administered 2018-01-01: 15 mL via OROMUCOSAL

## 2017-12-30 MED ORDER — HEPARIN (PORCINE) IN NACL 1000-0.9 UT/500ML-% IV SOLN
INTRAVENOUS | Status: AC
  Filled 2017-12-30: qty 1000

## 2017-12-30 MED ORDER — IOPAMIDOL (ISOVUE-370) INJECTION 76%
INTRAVENOUS | Status: AC
  Filled 2017-12-30: qty 125

## 2017-12-30 MED ORDER — HEPARIN SODIUM (PORCINE) 1000 UNIT/ML IJ SOLN
INTRAMUSCULAR | Status: DC | PRN
  Administered 2017-12-30 (×2): 2000 [IU] via INTRAVENOUS
  Administered 2017-12-30 (×2): 3000 [IU] via INTRAVENOUS

## 2017-12-30 MED ORDER — HEPARIN SODIUM (PORCINE) 5000 UNIT/ML IJ SOLN
5000.0000 [IU] | Freq: Three times a day (TID) | INTRAMUSCULAR | Status: DC
  Administered 2017-12-31 – 2018-01-02 (×4): 5000 [IU] via SUBCUTANEOUS
  Filled 2017-12-30 (×5): qty 1

## 2017-12-30 MED ORDER — FENTANYL CITRATE (PF) 100 MCG/2ML IJ SOLN
INTRAMUSCULAR | Status: DC | PRN
  Administered 2017-12-30: 50 ug via INTRAVENOUS
  Administered 2017-12-30 (×2): 25 ug via INTRAVENOUS

## 2017-12-30 MED ORDER — SODIUM CHLORIDE 0.9 % IV SOLN: 250.0000 mL | INTRAVENOUS | Status: DC | PRN

## 2017-12-30 MED ORDER — ONDANSETRON HCL 4 MG/2ML IJ SOLN: 4.0000 mg | Freq: Once | INTRAMUSCULAR | Status: DC

## 2017-12-30 MED ORDER — FENTANYL CITRATE (PF) 100 MCG/2ML IJ SOLN
50.0000 ug | Freq: Once | INTRAMUSCULAR | Status: AC
  Administered 2017-12-30: 50 ug via INTRAVENOUS

## 2017-12-30 MED ORDER — NITROGLYCERIN 1 MG/10 ML FOR IR/CATH LAB
INTRA_ARTERIAL | Status: AC
  Filled 2017-12-30: qty 10

## 2017-12-30 MED ORDER — SODIUM CHLORIDE 0.9% FLUSH: 3.0000 mL | INTRAVENOUS | Status: DC | PRN

## 2017-12-30 MED ORDER — ONDANSETRON HCL 4 MG/2ML IJ SOLN: 4.0000 mg | Freq: Four times a day (QID) | INTRAMUSCULAR | Status: DC | PRN

## 2017-12-30 MED ORDER — SODIUM CHLORIDE 0.9% FLUSH
3.0000 mL | Freq: Two times a day (BID) | INTRAVENOUS | Status: DC
  Administered 2017-12-30 – 2018-01-02 (×6): 3 mL via INTRAVENOUS

## 2017-12-30 MED ORDER — DIAZEPAM 5 MG PO TABS
5.0000 mg | ORAL_TABLET | Freq: Four times a day (QID) | ORAL | Status: DC | PRN
  Administered 2017-12-30: 5 mg via ORAL
  Filled 2017-12-30: qty 1

## 2017-12-30 MED ORDER — LIDOCAINE HCL (PF) 1 % IJ SOLN
INTRAMUSCULAR | Status: DC | PRN
  Administered 2017-12-30: 2 mL

## 2017-12-30 MED ORDER — HEPARIN (PORCINE) IN NACL 2-0.9 UNITS/ML
INTRAMUSCULAR | Status: AC | PRN
  Administered 2017-12-30 (×2): 500 mL

## 2017-12-30 MED ORDER — TIROFIBAN HCL IV 12.5 MG/250 ML
0.0750 ug/kg/min | INTRAVENOUS | Status: AC
  Administered 2017-12-30: 0.075 ug/kg/min via INTRAVENOUS

## 2017-12-30 MED ORDER — LABETALOL HCL 5 MG/ML IV SOLN: 10.0000 mg | INTRAVENOUS | Status: AC | PRN

## 2017-12-30 MED ORDER — VERAPAMIL HCL 2.5 MG/ML IV SOLN
INTRAVENOUS | Status: AC
  Filled 2017-12-30: qty 2

## 2017-12-30 MED ORDER — HYDRALAZINE HCL 20 MG/ML IJ SOLN: 5.0000 mg | INTRAMUSCULAR | Status: AC | PRN

## 2017-12-30 MED ORDER — LIDOCAINE HCL (PF) 1 % IJ SOLN
INTRAMUSCULAR | Status: AC
  Filled 2017-12-30: qty 30

## 2017-12-30 MED ORDER — NITROGLYCERIN 1 MG/10 ML FOR IR/CATH LAB
INTRA_ARTERIAL | Status: DC | PRN
  Administered 2017-12-30: 150 ug via INTRACORONARY

## 2017-12-30 MED ORDER — ATORVASTATIN CALCIUM 80 MG PO TABS
80.0000 mg | ORAL_TABLET | Freq: Every day | ORAL | Status: DC
  Administered 2017-12-30 – 2018-01-01 (×3): 80 mg via ORAL
  Filled 2017-12-30 (×3): qty 1

## 2017-12-30 MED ORDER — TIROFIBAN HCL IV 12.5 MG/250 ML
INTRAVENOUS | Status: AC
  Filled 2017-12-30: qty 250

## 2017-12-30 MED ORDER — NITROGLYCERIN 0.4 MG SL SUBL: 0.4000 mg | SUBLINGUAL_TABLET | SUBLINGUAL | Status: DC | PRN

## 2017-12-30 SURGICAL SUPPLY — 32 items
BAG SNAP BAND KOVER 36X36 (MISCELLANEOUS) ×1 IMPLANT
BALLN EMERGE MR 2.0X12 (BALLOONS) ×2
BALLN SAPPHIRE 1.5X12 (BALLOONS) ×2
BALLN SAPPHIRE 2.5X12 (BALLOONS) ×2
BALLN SAPPHIRE 2.5X15 (BALLOONS) ×2
BALLN SAPPHIRE ~~LOC~~ 2.5X12 (BALLOONS) ×1 IMPLANT
BALLN SAPPHIRE ~~LOC~~ 4.0X10 (BALLOONS) ×1 IMPLANT
BALLN ~~LOC~~ EMERGE MR 3.0X12 (BALLOONS) ×2
BALLN ~~LOC~~ EMERGE MR 4.5X12 (BALLOONS) ×2
BALLOON EMERGE MR 2.0X12 (BALLOONS) IMPLANT
BALLOON SAPPHIRE 1.5X12 (BALLOONS) IMPLANT
BALLOON SAPPHIRE 2.5X12 (BALLOONS) IMPLANT
BALLOON SAPPHIRE 2.5X15 (BALLOONS) IMPLANT
BALLOON ~~LOC~~ EMERGE MR 3.0X12 (BALLOONS) IMPLANT
BALLOON ~~LOC~~ EMERGE MR 4.5X12 (BALLOONS) IMPLANT
CATH 5FR JL3.5 JR4 ANG PIG MP (CATHETERS) ×1 IMPLANT
CATH EXTRAC PRONTO LP 6F RND (CATHETERS) ×1 IMPLANT
CATH LAUNCHER 6FR EBU3.5 (CATHETERS) ×1 IMPLANT
DEVICE RAD COMP TR BAND LRG (VASCULAR PRODUCTS) ×1 IMPLANT
GLIDESHEATH SLEND SS 6F .021 (SHEATH) ×1 IMPLANT
GUIDEWIRE INQWIRE 1.5J.035X260 (WIRE) IMPLANT
INQWIRE 1.5J .035X260CM (WIRE) ×2
KIT ENCORE 26 ADVANTAGE (KITS) ×2 IMPLANT
KIT HEART LEFT (KITS) ×2 IMPLANT
PACK CARDIAC CATHETERIZATION (CUSTOM PROCEDURE TRAY) ×2 IMPLANT
STENT SIERRA 3.50 X 23 MM (Permanent Stent) ×1 IMPLANT
STENT SIERRA 3.50 X 33 MM (Permanent Stent) ×1 IMPLANT
SYR MEDRAD MARK V 150ML (SYRINGE) ×2 IMPLANT
TRANSDUCER W/STOPCOCK (MISCELLANEOUS) ×2 IMPLANT
TUBING CIL FLEX 10 FLL-RA (TUBING) ×2 IMPLANT
WIRE COUGAR XT STRL 190CM (WIRE) ×4 IMPLANT
WIRE HI TORQ WHISPER MS 190CM (WIRE) ×3 IMPLANT

## 2017-12-30 NOTE — Progress Notes (Signed)
Pt refused troponin lab draw. MD Katrinka Blazing aware. Norva Karvonen, RN

## 2017-12-30 NOTE — Progress Notes (Addendum)
Progress Note  Patient Name: Ray Smith Date of Encounter: 12/30/2017  Primary Cardiologist: No primary care provider on file.   Subjective   Verbally combative and tangential.  Several family members available.  Inpatient Medications    Scheduled Meds: . [START ON 12/31/2017] aspirin EC  81 mg Oral Daily  . atorvastatin  80 mg Oral q1800  . carvedilol  3.125 mg Oral BID WC  . clopidogrel  75 mg Oral Daily  . fentaNYL      . [START ON 12/31/2017] heparin  5,000 Units Subcutaneous Q8H  . ondansetron (ZOFRAN) IV  4 mg Intravenous Once  . sodium chloride flush  3 mL Intravenous Q12H   Continuous Infusions: . sodium chloride Stopped (12/30/17 0354)  . sodium chloride    . sodium chloride 1 mL/kg/hr (12/30/17 0900)  . nitroGLYCERIN    . tirofiban 0.075 mcg/kg/min (12/30/17 0900)   PRN Meds: sodium chloride, acetaminophen, diazepam, nitroGLYCERIN, ondansetron (ZOFRAN) IV, sodium chloride flush   Vital Signs    Vitals:   12/30/17 0615 12/30/17 0700 12/30/17 0800 12/30/17 0900  BP: 113/80 (!) 124/93 (!) 114/91 119/83  Pulse:      Resp: (!) 27 15 13 20   Temp:  98.1 F (36.7 C)    TempSrc:  Oral    SpO2:   93%   Weight:      Height:        Intake/Output Summary (Last 24 hours) at 12/30/2017 0953 Last data filed at 12/30/2017 0900 Gross per 24 hour  Intake 617.6 ml  Output 500 ml  Net 117.6 ml   Filed Weights   12/30/17 0000 12/30/17 0345  Weight: 121 lb 4.1 oz (55 kg) 122 lb 5.7 oz (55.5 kg)    Telemetry    Normal sinus rhythm- Personally Reviewed  ECG    Right bundle, left axis deviation, biatrial abnormality, minimal ST elevation in V1 through V3.- Personally Reviewed  Physical Exam  Chronically ill-appearing, frail. GEN: No acute distress.   Neck: No JVD Cardiac: RRR, no murmurs, rubs, or gallops.  Respiratory: Clear to auscultation bilaterally. GI: Soft, nontender, non-distended  MS: No edema; No deformity. Neuro:   Verbally combative.  Tangential  conversation.  Continues to refer back to lower extremity occlusive disease as a particular problem. Psych: Normal affect   Labs    Chemistry Recent Labs  Lab 12/30/17 0002 12/30/17 0405  NA 137 136  K 3.4* 3.3*  CL 106 107  CO2 20* 23  GLUCOSE 111* 120*  BUN 19 18  CREATININE 1.29* 1.26*  CALCIUM 8.7* 8.3*  PROT 5.3*  --   ALBUMIN 2.4*  --   AST 51*  --   ALT 14  --   ALKPHOS 74  --   BILITOT 0.7  --   GFRNONAA 53* 55*  GFRAA >60 >60  ANIONGAP 11 6     Hematology Recent Labs  Lab 12/30/17 0002 12/30/17 0405  WBC 9.6 9.3  RBC 4.10* 4.35  HGB 11.6* 12.1*  HCT 36.4* 38.2*  MCV 88.8 87.8  MCH 28.3 27.8  MCHC 31.9 31.7  RDW 14.2 14.3  PLT 339 396    Cardiac Enzymes Recent Labs  Lab 12/30/17 0002 12/30/17 0405  TROPONINI 4.92* >65.00*   No results for input(s): TROPIPOC in the last 168 hours.   BNP Recent Labs  Lab 12/30/17 0405  BNP 1,841.1*     DDimer No results for input(s): DDIMER in the last 168 hours.   Radiology  Dg Chest Port 1 View  Result Date: 12/30/2017 CLINICAL DATA:  Chest pain EXAM: PORTABLE CHEST 1 VIEW COMPARISON:  None. FINDINGS: Heart size and pulmonary vascularity are normal. Diffuse interstitial pattern throughout the lungs. This could indicate chronic fibrosis or acute edema or pneumonitis. Patchy focal infiltration in the right upper lung suggesting focal pneumonia. No blunting of costophrenic angles. No pneumothorax. Mediastinal contours appear intact. Calcification of the aorta. IMPRESSION: Diffuse interstitial pattern to the lungs could represent chronic fibrosis or acute edema/pneumonitis. Focal airspace disease in the right upper lung suggesting pneumonia. Electronically Signed   By: Burman Nieves M.D.   On: 12/30/2017 05:47    Cardiac Studies   Cardiac catheterization 12/30/2017: Coronary Diagrams   Diagnostic Diagram       Post-Intervention Diagram        Cardiac cath conclusions: 1.  Severe distal left  main stenosis 2.  Acute total occlusion of the proximal LAD 3.  Severe ostial left circumflex stenosis 4.  Moderate mid RCA stenosis 5.  Severe segmental LV systolic dysfunction consistent with large anterolateral infarction with LVEF estimated at 25% 6.  Successful complex PCI of the left mainstem/proximal LAD/proximal circumflex as outlined above   Recommend dual antiplatelet therapy with Aspirin 81mg  daily and Clopidogrel 75mg  daily long-term (beyond 12 months) because of complex left main bifurcation PCI in the setting of anterior   Patient Profile     72 y.o. male who states he is ready to die but came to emergency room because his chest was hurting so bad.  Previously documented left main disease, COPD, PAD, tobacco abuse, and cognitive impairment.  Treated with complex culotte stenting of left main LAD circumflex last evening.  Assessment & Plan    1. Non-ST elevation ACS with occlusion of proximal LAD, high-grade left main and ostial circumflex, high-grade right coronary.  Successful intervention on left main, LAD, circumflex with culotte stenting.  Still moderate to severe mid LAD disease.  Significant RCA disease.  At risk for developing cardiogenic shock.   2. Acute systolic heart failure without evidence of gross volume overload or pulmonary edema.  EF 25%.  Institute beta-blocker/angiotensin-renin-aldosterone system blockade as tolerated by blood pressure and kidney function.. 3. PAD, details unknown 4. Likely COPD 5. Behavior/cognitive disorder 6. Hypokalemia  Refusing medications.  Will not take potassium to replete hypokalemia.  Hopefully will use dual antiplatelet therapy as prescribed.  Unable to carry on a reasonable conversation with the patient this morning as he is very angry and abusive.  Guarded prognosis.  For questions or updates, please contact [facility] Please consult [email or web address] for contact info under Cardiology/STEMI.      Signed, Lesleigh Noe, MD  12/30/2017, 9:53 AM

## 2017-12-30 NOTE — Progress Notes (Signed)
Critical troponin from lab >65. Expected finding.  Norva Karvonen, RN

## 2017-12-30 NOTE — ED Provider Notes (Signed)
[facility] EMERGENCY DEPARTMENT Provider Note   CSN: [id number] Arrival date & time: 12/29/17  2346     History   Chief Complaint Chief Complaint  Patient presents with  . Chest Pain    HPI Ray Smith is a 72 y.o. male.  HPI   72 year old male with history of coronary disease, hypertension, hyperlipidemia, here with chest pain.  Patient somewhat angry and agitated on arrival, but states that his chest does hurt intermittently for several months.  He states that tonight, he began to develop an aching, severe, 10 out of 10, pressure-like pain.  Denies any associated shortness of breath or diaphoresis.  He is unwilling to provide much history because he states he just "hurts all over" and does not want to repeat his questions.  Denies any recent fevers or chills.  Is been take his medications as prescribed.  No specific alleviating or aggravating factors.  Past Medical History:  Diagnosis Date  . Coronary artery disease    a.  presented with CP and inf STE but no ACS - LHC (10/23/13):  Dist LM 40-60%, ostial LAD 80%, mid LAD 50%, ostial CFX 50-70%, mid RCA 50%.  EF 60%. - CP not felt to be ischemic; CABG vs Med Rx d/w pt - pt opted for Med Rx  . Hyperlipidemia   . Hypertension     Patient Active Problem List   Diagnosis Date Noted  . Acute encephalopathy 10/06/2017  . AKI (acute kidney injury) (HCC)   . Altered mental status   . Hypertension 11/12/2013  . Dyslipidemia 10/24/2013  . ACS (acute coronary syndrome) (HCC) 10/23/2013  . Chest pain 10/23/2013  . Coronary Artery Disease 10/23/2013  . COPD (chronic obstructive pulmonary disease) (HCC) 10/23/2013  . Tobacco abuse 10/23/2013  . Precordial pain 10/23/2013    Past Surgical History:  Procedure Laterality Date  . LEFT HEART CATHETERIZATION WITH CORONARY ANGIOGRAM Bilateral 10/23/2013   Procedure: LEFT HEART CATHETERIZATION WITH CORONARY ANGIOGRAM;  Surgeon: Lesleigh Noe, MD;  Location: [facility] CATH  LAB;  Service: Cardiovascular;  Laterality: Bilateral;  . snake bite surgery          Home Medications    Prior to Admission medications   Medication Sig Start Date End Date Taking? Authorizing Provider  albuterol (PROVENTIL HFA;VENTOLIN HFA) 108 (90 Base) MCG/ACT inhaler Inhale 2 puffs into the lungs every 6 (six) hours as needed for wheezing. 03/14/15   [provider]  aspirin 325 MG EC tablet Take 1 tablet (325 mg total) by mouth daily. Patient not taking: Reported on 10/23/2017 10/08/17   Cathren Harsh, MD  aspirin 81 MG chewable tablet Chew 81 mg by mouth daily.    [provider]  doxycycline (VIBRA-TABS) 100 MG tablet Take 1 tablet (100 mg total) by mouth 2 (two) times daily. X 7 days Patient not taking: Reported on 10/23/2017 10/08/17   Rai, Delene Ruffini, MD  HYDROcodone-acetaminophen (NORCO/VICODIN) 5-325 MG tablet Take 1 tablet by mouth every 4 (four) hours as needed. 10/24/17   Tegeler, Canary Brim, MD  lovastatin (MEVACOR) 20 MG tablet Take 1 tablet (20 mg total) by mouth at bedtime. 10/08/17   Rai, Delene Ruffini, MD  naproxen sodium (ALEVE) 220 MG tablet Take 440 mg by mouth as needed (pain).    [provider]  nitroGLYCERIN (NITROSTAT) 0.4 MG SL tablet Place under your tongue if you have severe chest pressure or tightness 10/25/13   Barrett, Joline Salt, PA-C  ondansetron [facility])  4 MG tablet Take 1 tablet (4 mg total) by mouth every 8 (eight) hours as needed for nausea or vomiting. 10/24/17   Tegeler, Canary Brim, MD  pantoprazole (PROTONIX) 40 MG tablet Take 1 tablet (40 mg total) by mouth daily. 04/21/15   Loren Racer, MD  traMADol (ULTRAM) 50 MG tablet Take 1 tablet (50 mg total) by mouth every 6 (six) hours as needed. Patient not taking: Reported on 10/23/2017 10/08/17   Cathren Harsh, MD    Family History History reviewed. No pertinent family history.  Social History Social History   Tobacco Use  . Smoking status: Current Some Day Smoker     Types: Cigarettes  . Smokeless tobacco: Never Used  Substance Use Topics  . Alcohol use: No  . Drug use: No     Allergies   Tramadol; Flexeril [cyclobenzaprine]; Ibuprofen; and Lactose intolerance (gi)   Review of Systems Review of Systems  Constitutional: Positive for fatigue. Negative for chills and fever.  HENT: Negative for congestion and rhinorrhea.   Eyes: Negative for visual disturbance.  Respiratory: Positive for chest tightness and shortness of breath. Negative for cough and wheezing.   Cardiovascular: Positive for chest pain. Negative for leg swelling.  Gastrointestinal: Negative for abdominal pain, diarrhea, nausea and vomiting.  Genitourinary: Negative for dysuria and flank pain.  Musculoskeletal: Negative for neck pain and neck stiffness.  Skin: Negative for rash and wound.  Allergic/Immunologic: Negative for immunocompromised state.  Neurological: Positive for weakness. Negative for syncope and headaches.  All other systems reviewed and are negative.    Physical Exam Updated Vital Signs Wt 55 kg (121 lb 4.1 oz)   BMI 17.40 kg/m   Physical Exam  Constitutional: He is oriented to person, place, and time. He appears well-developed and well-nourished. He appears ill. He appears distressed.  Cachectic  HENT:  Head: Normocephalic and atraumatic.  Eyes: Conjunctivae are normal.  Neck: Neck supple.  Cardiovascular: Normal rate, regular rhythm and normal heart sounds. Exam reveals no friction rub.  No murmur heard. Pulmonary/Chest: Effort normal and breath sounds normal. No respiratory distress. He has no wheezes. He has no rales.  Abdominal: He exhibits no distension.  Musculoskeletal: He exhibits no edema.  Neurological: He is alert and oriented to person, place, and time. He exhibits normal muscle tone.  Skin: Skin is warm. Capillary refill takes less than 2 seconds.  Psychiatric: He has a normal mood and affect.  Nursing note and vitals reviewed.    ED  Treatments / Results  Labs (all labs ordered are listed, but only abnormal results are displayed) Labs Reviewed  CBC WITH DIFFERENTIAL/PLATELET  PROTIME-INR  APTT  COMPREHENSIVE METABOLIC PANEL  TROPONIN I  LIPID PANEL    EKG EKG Interpretation  Date/Time:  Sunday December 30 2017 00:07:02 EDT Ventricular Rate:  111 PR Interval:    QRS Duration: 152 QT Interval:  365 QTC Calculation: 496 R Axis:   -99 Text Interpretation:  Sinus tachycardia Right bundle branch block Anterior infarct, age indeterminate Lateral leads are also involved Since last EKG, concern for ANTERIOR STEMI Confirmed by Shaune Pollack [phone]) on 12/30/2017 12:10:09 AM   Radiology No results found.  Procedures .Critical Care Performed by: Shaune Pollack, MD Authorized by: Shaune Pollack, MD   Critical care provider statement:    Critical care time (minutes):  35   Critical care time was exclusive of:  Separately billable procedures and treating other patients and teaching time   Critical care was necessary to treat or  prevent imminent or life-threatening deterioration of the following conditions:  Circulatory failure and cardiac failure   Critical care was time spent personally by me on the following activities:  Development of treatment plan with patient or surrogate, discussions with consultants, evaluation of patient's response to treatment, examination of patient, obtaining history from patient or surrogate, ordering and performing treatments and interventions, ordering and review of laboratory studies, ordering and review of radiographic studies, pulse oximetry, re-evaluation of patient's condition and review of old charts   I assumed direction of critical care for this patient from another provider in my specialty: no     (including critical care time)  Medications Ordered in ED Medications  0.9 %  sodium chloride infusion (has no administration in time range)  ondansetron (ZOFRAN) injection 4 mg (has  no administration in time range)  nitroGLYCERIN 50 mg in dextrose 5 % 250 mL (0.2 mg/mL) infusion (has no administration in time range)  fentaNYL (SUBLIMAZE) 100 MCG/2ML injection (has no administration in time range)  heparin injection 3,300 Units (3,300 Units Intravenous Given 12/30/17 0008)  fentaNYL (SUBLIMAZE) injection 50 mcg (50 mcg Intravenous Given 12/30/17 0025)     Initial Impression / Assessment and Plan / ED Course  I have reviewed the triage vital signs and the nursing notes.  Pertinent labs & imaging results that were available during my care of the patient were reviewed by me and considered in my medical decision making (see chart for details).     72 year old male here with chest pain.  Patient activated as a code STEMI prior to arrival.  EKG does show anterior ST elevations.  Patient initially reticent to going to the Cath Lab, but on further discussion is now amenable after receiving analgesia.  Aspirin, heparin given.  Patient hemodynamically stable.  Cardiology immediately at bedside and took the patient directly to the Cath Lab once he consented.  Final Clinical Impressions(s) / ED Diagnoses   Final diagnoses:  Acute ST elevation myocardial infarction (STEMI), unspecified artery [facility])    ED Discharge Orders    None       Shaune Pollack, MD 12/30/17 [phone]

## 2017-12-30 NOTE — Progress Notes (Signed)
Patient is very agitated, refuses PRN oral valium. Is uncooperative. MD on call paged.

## 2017-12-30 NOTE — Progress Notes (Signed)
Patient refused MRSA PCR screening at this time. Patient is cursing and verbally abusive to staff.

## 2017-12-30 NOTE — Progress Notes (Signed)
Family member at bedside was able to talk patient into taking dose of valium 5 mg PO to "help him rest". However, patient refuses to take tylenol for pain. He states that he only takes aspirin or aleve for pain.

## 2017-12-30 NOTE — H&P (Signed)
CARDIOLOGY H&P  HPI: Ray Smith is a 72 y.o. male w/ HTN, smoking, and severe CAD presenting w/ STEMI.  Pt describes intermitted chest pain for several months. Has been worsening over the past few days. Acutely worsened tonight several hours before presentation. Called EMS. ECG in the field with RBBB and anterior ST elevations. Given aspirin, brought to ED.  In the ED, the patient expressed hesitancy about going to the cath lab. He cited concern over the risk of the procedure as well as with taking plavix after the procedure. He eventually provided emergency verbal informed consent for coronary angiography and possible PCI.   Review of Systems:     Cardiac Review of Systems: {Y] = yes [ ]  = no  Chest Pain [  X  ]  Resting SOB [   ] Exertional SOB  [  ]  Orthopnea [  ]   Pedal Edema [   ]    Palpitations [  ] Syncope  [  ]   Presyncope [   ]  General Review of Systems: [Y] = yes [  ]=no Constitional: recent weight change [  ]; anorexia [  ]; fatigue [  ]; nausea [  ]; night sweats [  ]; fever [  ]; or chills [  ];                                                                     Dental: poor dentition[  ];   Eye : blurred vision [  ]; diplopia [   ]; vision changes [  ];  Amaurosis fugax[  ]; Resp: cough [  ];  wheezing[  ];  hemoptysis[  ]; shortness of breath[  ]; paroxysmal nocturnal dyspnea[  ]; dyspnea on exertion[  ]; or orthopnea[  ];  GI:  gallstones[  ], vomiting[  ];  dysphagia[  ]; melena[  ];  hematochezia [  ]; heartburn[  ];   GU: kidney stones [  ]; hematuria[  ];   dysuria [  ];  nocturia[  ];               Skin: rash [  ], swelling[  ];, hair loss[  ];  peripheral edema[  ];  or itching[  ]; Musculosketetal: myalgias[  ];  joint swelling[  ];  joint erythema[  ];  joint pain[  ];  back pain[  ];  Heme/Lymph: bruising[  ];  bleeding[  ];  anemia[  ];  Neuro: TIA[  ];  headaches[  ];  stroke[  ];  vertigo[  ];  seizures[  ];   paresthesias[  ];  difficulty walking[   ];  Psych:depression[  ]; anxiety[  ];  Endocrine: diabetes[  ];  thyroid dysfunction[  ];  Other:  Past Medical History:  Diagnosis Date  . Coronary artery disease    a.  presented with CP and inf STE but no ACS - LHC (10/23/13):  Dist LM 40-60%, ostial LAD 80%, mid LAD 50%, ostial CFX 50-70%, mid RCA 50%.  EF 60%. - CP not felt to be ischemic; CABG vs Med Rx d/w pt - pt opted for Med Rx  . Hyperlipidemia   . Hypertension  Prior to Admission medications   Medication Sig Start Date End Date Taking? Authorizing Provider  albuterol (PROVENTIL HFA;VENTOLIN HFA) 108 (90 Base) MCG/ACT inhaler Inhale 2 puffs into the lungs every 6 (six) hours as needed for wheezing. 03/14/15   [provider]  aspirin 325 MG EC tablet Take 1 tablet (325 mg total) by mouth daily. Patient not taking: Reported on 10/23/2017 10/08/17   Cathren Harsh, MD  aspirin 81 MG chewable tablet Chew 81 mg by mouth daily.    [provider]  doxycycline (VIBRA-TABS) 100 MG tablet Take 1 tablet (100 mg total) by mouth 2 (two) times daily. X 7 days Patient not taking: Reported on 10/23/2017 10/08/17   Rai, Delene Ruffini, MD  HYDROcodone-acetaminophen (NORCO/VICODIN) 5-325 MG tablet Take 1 tablet by mouth every 4 (four) hours as needed. 10/24/17   Tegeler, Canary Brim, MD  lovastatin (MEVACOR) 20 MG tablet Take 1 tablet (20 mg total) by mouth at bedtime. 10/08/17   Rai, Delene Ruffini, MD  naproxen sodium (ALEVE) 220 MG tablet Take 440 mg by mouth as needed (pain).    [provider]  nitroGLYCERIN (NITROSTAT) 0.4 MG SL tablet Place under your tongue if you have severe chest pressure or tightness 10/25/13   Barrett, Joline Salt, PA-C  ondansetron (ZOFRAN) 4 MG tablet Take 1 tablet (4 mg total) by mouth every 8 (eight) hours as needed for nausea or vomiting. 10/24/17   Tegeler, Canary Brim, MD  pantoprazole (PROTONIX) 40 MG tablet Take 1 tablet (40 mg total) by mouth daily. 04/21/15   Loren Racer, MD  traMADol  (ULTRAM) 50 MG tablet Take 1 tablet (50 mg total) by mouth every 6 (six) hours as needed. Patient not taking: Reported on 10/23/2017 10/08/17   Cathren Harsh, MD    Allergies  Allergen Reactions  . Tramadol Anaphylaxis  . Flexeril [Cyclobenzaprine] Other (See Comments)    Per patient "it made my heart stop"  . Ibuprofen Other (See Comments)    Overuse damages pt's kidneys Overuse damages pt's kidneys   . Lactose Intolerance (Gi)     Digestive issues    Social History   Socioeconomic History  . Marital status: Divorced    Spouse name: Not on file  . Number of children: Not on file  . Years of education: Not on file  . Highest education level: Not on file  Occupational History  . Not on file  Social Needs  . Financial resource strain: Not on file  . Food insecurity:    Worry: Not on file    Inability: Not on file  . Transportation needs:    Medical: Not on file    Non-medical: Not on file  Tobacco Use  . Smoking status: Current Some Day Smoker    Types: Cigarettes  . Smokeless tobacco: Never Used  Substance and Sexual Activity  . Alcohol use: No  . Drug use: No  . Sexual activity: Not on file  Lifestyle  . Physical activity:    Days per week: Not on file    Minutes per session: Not on file  . Stress: Not on file  Relationships  . Social connections:    Talks on phone: Not on file    Gets together: Not on file    Attends religious service: Not on file    Active member of club or organization: Not on file    Attends meetings of clubs or organizations: Not on file    Relationship status: Not on  file  . Intimate partner violence:    Fear of current or ex partner: Not on file    Emotionally abused: Not on file    Physically abused: Not on file    Forced sexual activity: Not on file  Other Topics Concern  . Not on file  Social History Narrative  . Not on file    History reviewed. No pertinent family history.  PHYSICAL EXAM: Vitals:   12/30/17 0052   SpO2: 92%   General:  Elderly, frail HEENT: normal Neck: supple. no JVD Cor: PMI nondisplaced. Regular rhythm. Tachycardic. +S4 Lungs: clear Abdomen: soft, nontender, nondistended. No hepatosplenomegaly. No bruits or masses. Good bowel sounds. Extremities: no cyanosis, clubbing, rash, edema Neuro: alert & oriented x 3, cranial nerves grossly intact. moves all 4 extremities w/o difficulty. Affect pleasant.  ECG: RBBB, anterior ST elevations with evolving Q waves  Results for orders placed or performed during the hospital encounter of 12/29/17 (from the past 24 hour(s))  CBC with Differential/Platelet     Status: Abnormal   Collection Time: 12/30/17 12:02 AM  Result Value Ref Range   WBC 9.6 4.0 - 10.5 K/uL   RBC 4.10 (L) 4.22 - 5.81 MIL/uL   Hemoglobin 11.6 (L) 13.0 - 17.0 g/dL   HCT 28.2 (L) 06.0 - 15.6 %   MCV 88.8 78.0 - 100.0 fL   MCH 28.3 26.0 - 34.0 pg   MCHC 31.9 30.0 - 36.0 g/dL   RDW 15.3 79.4 - 32.7 %   Platelets 339 150 - 400 K/uL   Neutrophils Relative % 85 %   Neutro Abs 8.2 (H) 1.7 - 7.7 K/uL   Lymphocytes Relative 5 %   Lymphs Abs 0.5 (L) 0.7 - 4.0 K/uL   Monocytes Relative 8 %   Monocytes Absolute 0.8 0.1 - 1.0 K/uL   Eosinophils Relative 0 %   Eosinophils Absolute 0.0 0.0 - 0.7 K/uL   Basophils Relative 1 %   Basophils Absolute 0.1 0.0 - 0.1 K/uL   Immature Granulocytes 1 %   Abs Immature Granulocytes 0.1 0.0 - 0.1 K/uL  Protime-INR     Status: None   Collection Time: 12/30/17 12:02 AM  Result Value Ref Range   Prothrombin Time 13.4 11.4 - 15.2 seconds   INR 1.03   APTT     Status: None   Collection Time: 12/30/17 12:02 AM  Result Value Ref Range   aPTT 34 24 - 36 seconds   No results found.  ASSESSMENT: Ray Smith is a 71 y.o. male w/ HTN, smoking, and severe CAD presenting w/ anterior STEMI.  The patient was found to have a high-grade distal left main lesion extending to the LAD and circumflex bifurcation.This was treated with [place] stents  extending from the left main into the LAD and circumflex arteries.  The patient achieved an excellent angiographic result, however it is unknown how much of his LAD territory was viable given his late presentation after several days of symptoms.  Notably, the patient has stated multiple times that he does not want to take medications, thus his discharge medication regimen should be limited to absolute necessities.  PLAN/DISCUSSION: #) NSTEMI - check lipids, A1c - ASA 81mg  daily - atorvastatin 80mg  QHS - plavix 75mg  daily - SLN, nitro gtt PRN - metoprolol 25mg  BID - echo in AM - cardiac rehab  Rosario Jacks, MD Cardiology Fellow, PGY-6

## 2017-12-31 ENCOUNTER — Encounter (HOSPITAL_COMMUNITY): Payer: Self-pay | Admitting: Cardiovascular Disease

## 2017-12-31 ENCOUNTER — Other Ambulatory Visit (HOSPITAL_COMMUNITY): Payer: Medicare Other

## 2017-12-31 DIAGNOSIS — I5021 Acute systolic (congestive) heart failure: Secondary | ICD-10-CM

## 2017-12-31 LAB — POCT I-STAT 3, ART BLOOD GAS (G3+)
Acid-base deficit: 5 mmol/L — ABNORMAL HIGH (ref 0.0–2.0)
Acid-base deficit: 5 mmol/L — ABNORMAL HIGH (ref 0.0–2.0)
Acid-base deficit: 5 mmol/L — ABNORMAL HIGH (ref 0.0–2.0)
BICARBONATE: 18.4 mmol/L — AB (ref 20.0–28.0)
BICARBONATE: 19.8 mmol/L — AB (ref 20.0–28.0)
Bicarbonate: 18.1 mmol/L — ABNORMAL LOW (ref 20.0–28.0)
O2 SAT: 98 %
O2 Saturation: 97 %
O2 Saturation: 98 %
PCO2 ART: 28 mmHg — AB (ref 32.0–48.0)
PCO2 ART: 29.2 mmHg — AB (ref 32.0–48.0)
PCO2 ART: 33.9 mmHg (ref 32.0–48.0)
PH ART: 7.406 (ref 7.350–7.450)
PH ART: 7.418 (ref 7.350–7.450)
PH ART: 7.375 (ref 7.350–7.450)
PO2 ART: 102 mmHg (ref 83.0–108.0)
TCO2: 19 mmol/L — AB (ref 22–32)
TCO2: 19 mmol/L — ABNORMAL LOW (ref 22–32)
TCO2: 21 mmol/L — AB (ref 22–32)
pO2, Arterial: 89 mmHg (ref 83.0–108.0)
pO2, Arterial: 98 mmHg (ref 83.0–108.0)

## 2017-12-31 LAB — POCT I-STAT, CHEM 8
BUN: 19 mg/dL (ref 8–23)
Calcium, Ion: 1.23 mmol/L (ref 1.15–1.40)
Chloride: 103 mmol/L (ref 98–111)
Creatinine, Ser: 1 mg/dL (ref 0.61–1.24)
GLUCOSE: 116 mg/dL — AB (ref 70–99)
HCT: 39 % (ref 39.0–52.0)
HEMOGLOBIN: 13.3 g/dL (ref 13.0–17.0)
POTASSIUM: 3 mmol/L — AB (ref 3.5–5.1)
SODIUM: 138 mmol/L (ref 135–145)
TCO2: 19 mmol/L — ABNORMAL LOW (ref 22–32)

## 2017-12-31 LAB — POCT ACTIVATED CLOTTING TIME
ACTIVATED CLOTTING TIME: 180 s
ACTIVATED CLOTTING TIME: 296 s
Activated Clotting Time: 246 seconds
Activated Clotting Time: 263 seconds
Activated Clotting Time: 301 seconds
Activated Clotting Time: 268 seconds

## 2017-12-31 MED ORDER — SPIRONOLACTONE 25 MG PO TABS
12.5000 mg | ORAL_TABLET | Freq: Every day | ORAL | Status: DC
  Administered 2017-12-31 – 2018-01-02 (×3): 12.5 mg via ORAL
  Filled 2017-12-31 (×3): qty 0.5

## 2017-12-31 MED ORDER — POTASSIUM CHLORIDE CRYS ER 20 MEQ PO TBCR
20.0000 meq | EXTENDED_RELEASE_TABLET | Freq: Every day | ORAL | Status: DC
  Administered 2017-12-31: 20 meq via ORAL
  Filled 2017-12-31: qty 1

## 2017-12-31 MED ORDER — FUROSEMIDE 10 MG/ML IJ SOLN
20.0000 mg | Freq: Once | INTRAMUSCULAR | Status: AC
  Administered 2017-12-31: 20 mg via INTRAVENOUS
  Filled 2017-12-31: qty 2

## 2017-12-31 MED FILL — Heparin Sod (Porcine)-NaCl IV Soln 1000 Unit/500ML-0.9%: INTRAVENOUS | Qty: 1000 | Status: AC

## 2017-12-31 NOTE — Progress Notes (Signed)
Patient refuses to get in bed for echo

## 2017-12-31 NOTE — Progress Notes (Addendum)
Progress Note  Patient Name: Ray Smith Date of Encounter: 12/31/2017  Primary Cardiologist: No primary care provider on file.   Subjective   Feels ok. No further CP. No dyspnea at rest but on O2. He is weak.   Inpatient Medications    Scheduled Meds: . aspirin EC  81 mg Oral Daily  . atorvastatin  80 mg Oral q1800  . carvedilol  3.125 mg Oral BID WC  . clopidogrel  75 mg Oral Daily  . feeding supplement (ENSURE ENLIVE)  237 mL Oral BID BM  . heparin  5,000 Units Subcutaneous Q8H  . mouth rinse  15 mL Mouth Rinse BID  . sodium chloride flush  3 mL Intravenous Q12H   Continuous Infusions: . sodium chloride Stopped (12/30/17 0354)  . sodium chloride    . nitroGLYCERIN     PRN Meds: sodium chloride, acetaminophen, diazepam, nitroGLYCERIN, ondansetron (ZOFRAN) IV, oxyCODONE-acetaminophen, sodium chloride flush   Vital Signs    Vitals:   12/31/17 0000 12/31/17 0400 12/31/17 0500 12/31/17 0600  BP: 98/74 (!) 75/54 (!) 82/55 (!) 89/65  Pulse:  75 75 79  Resp: (!) 21 18 (!) 21 (!) 22  Temp:      TempSrc:      SpO2:  100% 100% 99%  Weight:      Height:        Intake/Output Summary (Last 24 hours) at 12/31/2017 0724 Last data filed at 12/31/2017 0600 Gross per 24 hour  Intake 1751.79 ml  Output 760 ml  Net 991.79 ml   Filed Weights   12/30/17 0000 12/30/17 0345  Weight: 121 lb 4.1 oz (55 kg) 122 lb 5.7 oz (55.5 kg)    Telemetry    Sinus rhythm - Personally Reviewed   Physical Exam  Alert oriented, cachectic male in NAD GEN: No acute distress.   Neck: No JVD Cardiac: RRR, no murmurs, rubs, or gallops.  Respiratory:  rhonchi bilaterally GI: Soft, nontender, non-distended  MS: No edema; No deformity. Neuro:  Nonfocal  Psych: Normal affect   Labs    Chemistry Recent Labs  Lab 12/30/17 0002 12/30/17 0405 12/30/17 1524  NA 137 136 135  K 3.4* 3.3* 3.2*  CL 106 107 107  CO2 20* 23 21*  GLUCOSE 111* 120* 136*  BUN 19 18 17   CREATININE 1.29* 1.26*  1.24  CALCIUM 8.7* 8.3* 8.0*  PROT 5.3*  --   --   ALBUMIN 2.4*  --   --   AST 51*  --   --   ALT 14  --   --   ALKPHOS 74  --   --   BILITOT 0.7  --   --   GFRNONAA 53* 55* 56*  GFRAA >60 >60 >60  ANIONGAP 11 6 7      Hematology Recent Labs  Lab 12/30/17 0002 12/30/17 0405  WBC 9.6 9.3  RBC 4.10* 4.35  HGB 11.6* 12.1*  HCT 36.4* 38.2*  MCV 88.8 87.8  MCH 28.3 27.8  MCHC 31.9 31.7  RDW 14.2 14.3  PLT 339 396    Cardiac Enzymes Recent Labs  Lab 12/30/17 0002 12/30/17 0405 12/30/17 1524  TROPONINI 4.92* >65.00* >65.00*   No results for input(s): TROPIPOC in the last 168 hours.   BNP Recent Labs  Lab 12/30/17 0405 12/30/17 1524  BNP 1,841.1* 2,345.7*     DDimer No results for input(s): DDIMER in the last 168 hours.   Radiology    Dg Chest [facility]  Result Date: 12/30/2017 CLINICAL DATA:  Chest pain EXAM: PORTABLE CHEST 1 VIEW COMPARISON:  None. FINDINGS: Heart size and pulmonary vascularity are normal. Diffuse interstitial pattern throughout the lungs. This could indicate chronic fibrosis or acute edema or pneumonitis. Patchy focal infiltration in the right upper lung suggesting focal pneumonia. No blunting of costophrenic angles. No pneumothorax. Mediastinal contours appear intact. Calcification of the aorta. IMPRESSION: Diffuse interstitial pattern to the lungs could represent chronic fibrosis or acute edema/pneumonitis. Focal airspace disease in the right upper lung suggesting pneumonia. Electronically Signed   By: Burman Nieves M.D.   On: 12/30/2017 05:47    Patient Profile     72 y.o. male with severe medical comorbidities presents with anterior STEMI and undergoes complex PCI of the left main/LAD/LCx  Assessment & Plan    1. Anterior STEMI: presents with RBBB and anterior STE with total LAD occlusion. He had severe LM disease and is treated with complex PCI left main bifurcation stenting. Continue ASA and clopidogrel. Medical therapy for residual  CAD.  2. Acute systolic heart failure: severe LV systolic dysfunction noted at cath with LVEF < 30%. Echo pending. BNP 2346. Diffuse opacities on CXR today. Diurese with IV lasix. Replete K. He is very selective about which meds he will take. Will try to get him on CHF Rx but likelihood of compliance is extremely low. Add aldactone. BMET in am if he agrees has been refusing some lab draws. Continue coreg.   3. Severe medical noncompliance: longstanding with very limited insight. Previously declined CABG in 2015, has never taken recommended medication except ASA. Very difficult to treat. Family supportive otherwise extremely challenging.   4. Hyperlipidemia: LDL 140. Atorvastatin 80 mg in hospital unlikely to take at home.  For questions or updates, please contact [facility] Please consult [email or web address] for contact info under Cardiology/STEMI.     Signed, Tonny Bollman, MD  12/31/2017, 7:24 AM

## 2017-12-31 NOTE — Progress Notes (Signed)
Attempted echo.  Patient refused to move from chair to the bed.  Will attempt at a later time.

## 2017-12-31 NOTE — Progress Notes (Signed)
Pt refusing turns and 2200 dose of heparin. Education provided. Continues to refuse. Also states " Don't wake me up for anything until 7am or I will leave." Will continue to monitor closely. Modena Jansky RN 2 Heart CVICU

## 2017-12-31 NOTE — Progress Notes (Signed)
CARDIAC REHAB PHASE I   Attempted to talk to pt and son about importance of Plavix, ASA, and NTG. Pt states he's "heard that before". Uninterested in any education. MI booklet, heart healthy, low sodium, and diabetic diets given, along with tip sheet for quitting smoking and brochure for cardiac rehab given. Handouts taken by son, pt refusing anything at this time. Will refer to CRP II [place] with knowledge pt is uninterested in attending. Will continue to follow and re-attempt education at a later time.  [id number] Reynold Bowen, RN BSN 12/31/2017 10:10 AM

## 2017-12-31 NOTE — Progress Notes (Signed)
Initial Nutrition Assessment  DOCUMENTATION CODES:   Underweight  INTERVENTION:   Ensure Enlive po TID, each supplement provides 350 kcal and 20 grams of protein  If pt continues to eat poorly, recommend liberalizing diet   NUTRITION DIAGNOSIS:   Inadequate oral intake related to chronic illness, acute illness, poor appetite as evidenced by meal completion < 25%, per patient/family report.  GOAL:   Patient will meet greater than or equal to 90% of their needs  MONITOR:   PO intake, Supplement acceptance, Labs, Weight trends  REASON FOR ASSESSMENT:   Malnutrition Screening Tool    ASSESSMENT:   72 yo male admitted with NSTEMI, acute CHF with severe medical noncompliance. Pt with long-standing hx of medical non-compliance, CAD/CABG, HTN   Noted pt has been combative at times, pt cursing and not cooperative on visit today. Pt did not provide any information with regards to diet or weight history.   Recorded po intake 0-25% of meals. Per RN, pt has eaten very little since admission. Pt has consumed some Ensure Enlive; drinking Coke on visit today.  Per RN, pt has been eating poorly for several months PTA.   Per RN, pt reporting he weighed 160 pounds and now weighs 105 pounds. Current wt 122 pounds. Noted weight of 120 pounds earlier this year, pt with weight of 128 pounds in 2015.   Pt would not allow RD to perform Nutrition-Focused Physical Exam at this time.   Pt is very possibly malnourished but unable to diagnose given limited information and inability to perform physical exam  Labs: potassium 3.2 Meds: reviewed  NUTRITION - FOCUSED PHYSICAL EXAM:  Unable to perform  Diet Order:   Diet Order           Diet Heart Room service appropriate? Yes; Fluid consistency: Thin  Diet effective now          EDUCATION NEEDS:   Not appropriate for education at this time  Skin:  Skin Assessment: Reviewed RN Assessment  Last BM:  7/7  Height:   Ht Readings from  Last 1 Encounters:  12/30/17 5\' 10"  (1.778 m)    Weight:   Wt Readings from Last 1 Encounters:  12/30/17 122 lb 5.7 oz (55.5 kg)    Ideal Body Weight:     BMI:  Body mass index is 17.56 kg/m.  Estimated Nutritional Needs:   Kcal:  1700-1950 kcals   Protein:  85-100 g  Fluid:  >/= 1.7 L    Romelle Starcher MS, RD, LDN, CNSC [phone] Pager  [phone] Weekend/On-Call Pager

## 2018-01-01 ENCOUNTER — Inpatient Hospital Stay (HOSPITAL_COMMUNITY): Payer: Medicare Other

## 2018-01-01 DIAGNOSIS — I255 Ischemic cardiomyopathy: Secondary | ICD-10-CM

## 2018-01-01 DIAGNOSIS — R079 Chest pain, unspecified: Secondary | ICD-10-CM

## 2018-01-01 LAB — ECHOCARDIOGRAM COMPLETE
HEIGHTINCHES: 70 in
WEIGHTICAEL: 1957.68 [oz_av]

## 2018-01-01 LAB — BASIC METABOLIC PANEL
ANION GAP: 8 (ref 5–15)
BUN: 20 mg/dL (ref 8–23)
CO2: 25 mmol/L (ref 22–32)
CREATININE: 1.29 mg/dL — AB (ref 0.61–1.24)
Calcium: 9 mg/dL (ref 8.9–10.3)
Chloride: 105 mmol/L (ref 98–111)
GFR calc non Af Amer: 53 mL/min — ABNORMAL LOW (ref >60)
GLUCOSE: 120 mg/dL — AB (ref 70–99)
Potassium: 4 mmol/L (ref 3.5–5.1)
Sodium: 138 mmol/L (ref 135–145)

## 2018-01-01 MED ORDER — FUROSEMIDE 10 MG/ML IJ SOLN
40.0000 mg | Freq: Once | INTRAMUSCULAR | Status: AC
Start: 2018-01-01 — End: 2018-01-01
  Administered 2018-01-01: 40 mg via INTRAVENOUS
  Filled 2018-01-01: qty 4

## 2018-01-01 MED ORDER — PERFLUTREN LIPID MICROSPHERE
INTRAVENOUS | Status: AC
  Administered 2018-01-01: 3 mL via INTRAVENOUS
  Filled 2018-01-01: qty 10

## 2018-01-01 MED ORDER — POTASSIUM CHLORIDE CRYS ER 20 MEQ PO TBCR
60.0000 meq | EXTENDED_RELEASE_TABLET | Freq: Once | ORAL | Status: AC
  Administered 2018-01-01: 60 meq via ORAL
  Filled 2018-01-01: qty 3

## 2018-01-01 MED ORDER — PERFLUTREN LIPID MICROSPHERE
1.0000 mL | INTRAVENOUS | Status: AC | PRN
  Administered 2018-01-01: 3 mL via INTRAVENOUS
  Filled 2018-01-01: qty 10

## 2018-01-01 NOTE — Progress Notes (Signed)
  Echocardiogram 2D Echocardiogram with Definity has been performed.  Ray Smith 01/01/2018, 10:57 AM

## 2018-01-01 NOTE — Progress Notes (Signed)
Progress Note  Patient Name: Ray Smith Date of Encounter: 01/01/2018  Primary Cardiologist: Katrinka Blazing  Subjective   Pt denies chest pain and SOB but is loud and verbally abusive to everyone in the room. He states the he will leave today. Then he states he wants to stay.   Inpatient Medications    Scheduled Meds: . aspirin EC  81 mg Oral Daily  . atorvastatin  80 mg Oral q1800  . carvedilol  3.125 mg Oral BID WC  . clopidogrel  75 mg Oral Daily  . feeding supplement (ENSURE ENLIVE)  237 mL Oral BID BM  . heparin  5,000 Units Subcutaneous Q8H  . mouth rinse  15 mL Mouth Rinse BID  . potassium chloride  20 mEq Oral Daily  . sodium chloride flush  3 mL Intravenous Q12H  . spironolactone  12.5 mg Oral Daily   Continuous Infusions: . sodium chloride Stopped (12/30/17 0354)  . sodium chloride    . nitroGLYCERIN     PRN Meds: sodium chloride, acetaminophen, diazepam, nitroGLYCERIN, ondansetron (ZOFRAN) IV, oxyCODONE-acetaminophen, sodium chloride flush   Vital Signs    Vitals:   01/01/18 0600 01/01/18 0700 01/01/18 0800 01/01/18 0812  BP: 91/71 (!) 138/111 (!) 125/100   Pulse: 89 96 96   Resp: (!) 26 (!) 30 (!) 28   Temp:    97.7 F (36.5 C)  TempSrc:    Oral  SpO2: 99% 100% 100%   Weight:      Height:        Intake/Output Summary (Last 24 hours) at 01/01/2018 0836 Last data filed at 12/31/2017 2200 Gross per 24 hour  Intake 430 ml  Output 350 ml  Net 80 ml   Filed Weights   12/30/17 0000 12/30/17 0345  Weight: 121 lb 4.1 oz (55 kg) 122 lb 5.7 oz (55.5 kg)    Telemetry    Sinus - Personally Reviewed  EKG: No am EKG   Physical Exam   General: Thin cachectic male, NAD HEENT: OP clear, mucus membranes moist  SKIN: warm, dry. No rashes. Neuro: No focal deficits  Musculoskeletal: Muscle strength 5/5 all ext  Psychiatric: Mood and affect normal  Neck: No JVD, no carotid bruits, no thyromegaly, no lymphadenopathy.  Lungs: Basilar cracklesClear bilaterally,  no wheezes, rhonci, crackles Cardiovascular: Regular rate and rhythm. No murmurs, gallops or rubs. Abdomen:Soft. Bowel sounds present. Non-tender.  Extremities: No lower extremity edema. Pulses are 2 + in the bilateral DP/PT.   Labs    Chemistry Recent Labs  Lab 12/30/17 0002 12/30/17 0107 12/30/17 0405 12/30/17 1524  NA 137 138 136 135  K 3.4* 3.0* 3.3* 3.2*  CL 106 103 107 107  CO2 20*  --  23 21*  GLUCOSE 111* 116* 120* 136*  BUN 19 19 18 17   CREATININE 1.29* 1.00 1.26* 1.24  CALCIUM 8.7*  --  8.3* 8.0*  PROT 5.3*  --   --   --   ALBUMIN 2.4*  --   --   --   AST 51*  --   --   --   ALT 14  --   --   --   ALKPHOS 74  --   --   --   BILITOT 0.7  --   --   --   GFRNONAA 53*  --  55* 56*  GFRAA >60  --  >60 >60  ANIONGAP 11  --  6 7     Hematology Recent Labs  Lab 12/30/17 0002 12/30/17 0107 12/30/17 0405  WBC 9.6  --  9.3  RBC 4.10*  --  4.35  HGB 11.6* 13.3 12.1*  HCT 36.4* 39.0 38.2*  MCV 88.8  --  87.8  MCH 28.3  --  27.8  MCHC 31.9  --  31.7  RDW 14.2  --  14.3  PLT 339  --  396    Cardiac Enzymes Recent Labs  Lab 12/30/17 0002 12/30/17 0405 12/30/17 1524  TROPONINI 4.92* >65.00* >65.00*   No results for input(s): TROPIPOC in the last 168 hours.   BNP Recent Labs  Lab 12/30/17 0405 12/30/17 1524  BNP 1,841.1* 2,345.7*     DDimer No results for input(s): DDIMER in the last 168 hours.   Radiology    No results found.  Patient Profile     72 y.o. male with h/o HTN, HLD, CAD and tobacco abuse admitted with an anterior STEMI on 12/30/17. He was found to have a severe left main stenosis involving the LAD and Circumflex. This was treated with complex stenting of the left main bifurcation into the LAD and Circumflex.   Assessment & Plan    1. CAD/Anterior STEMI: Pt with distal left main stenosis, total occlusion ostial LAD and severe stenosis ostial Circumflex. This was treated with stenting from the left main into the LAD and the Circumflex  with an excellent result. He has no chest pain this am. Will continue DAPT for lifetime with ASA and Plavix. Continue high intensity statin. Continue beta blocker.   2. Ischemic cardiomyopathy/Acute systolic heart failure: LVEF less than 30% by cath . He refused echo yesterday. He agrees to echo today. His lung exam suggests volume overload. Will give one dose of IV Lasix today.  Continue beta blocker. Will add Ace-inh prior to discharge if BP allows.   3. Hyperlipidemia: Continue high intensity statin.   4. Hypokalemia: Replace potassium this am.   5. Medical non-compliance: He has refused blood draws and his echo. He has been difficult for the staff. Unlikely to take all medications upon discharge. He agrees to stay today and have his echo. Likely d/c home tomorrow.    For questions or updates, please contact [facility] Please consult [email or web address] for contact info under Cardiology/STEMI.     Signed, Verne Carrow, MD  01/01/2018, 8:36 AM

## 2018-01-01 NOTE — Progress Notes (Signed)
Pt phone changer not in room on 2H. Pts belongings sent with pt to 6E include a pair of glasses that is pt's son per pt report, underwear, drinks and snacks, pt's cell phone.

## 2018-01-01 NOTE — Progress Notes (Signed)
PT Cancellation Note  Patient Details Name: Ray Smith MRN: [id number] DOB: June 21, 1946   Cancelled Treatment:    Reason Eval/Treat Not Completed: (pt refused). Pt refusing all treatment at this time. Pt adamantly refusing PT. Acute PT to return as able, as appropriate.  Lewis Shock, PT, DPT Pager #: [phone] Office #: [phone]    Rozell Searing Delorise Hunkele 01/01/2018, 1:00 PM

## 2018-01-01 NOTE — Progress Notes (Signed)
Notified by RN that pt refusing ambulation today. I attempted to talk with him and offer to walk with him. Unfortunately he continued to be irrational. He states repetitively that he hurts when he walks and I that I want to hurt him. I attempted to ask what hurts, as he has a history of PAD but he refused to say anything but "me". He voiced that he doesn't want to live and I suggested to him and son that he could benefit from a palliative consult to help him be comfortable. He disagreed with everything I tried to talk about but it seems to be appropriate to pursue palliative for comfort and understanding.  [phone] Ethelda Chick CES, ACSM 12:15 PM 01/01/2018

## 2018-01-02 DIAGNOSIS — I5023 Acute on chronic systolic (congestive) heart failure: Secondary | ICD-10-CM

## 2018-01-02 DIAGNOSIS — I5021 Acute systolic (congestive) heart failure: Secondary | ICD-10-CM

## 2018-01-02 LAB — BASIC METABOLIC PANEL
Anion gap: 8 (ref 5–15)
BUN: 20 mg/dL (ref 8–23)
CHLORIDE: 107 mmol/L (ref 98–111)
CO2: 25 mmol/L (ref 22–32)
CREATININE: 1.4 mg/dL — AB (ref 0.61–1.24)
Calcium: 9 mg/dL (ref 8.9–10.3)
GFR, EST AFRICAN AMERICAN: 56 mL/min — AB (ref >60)
GFR, EST NON AFRICAN AMERICAN: 48 mL/min — AB (ref >60)
Glucose, Bld: 107 mg/dL — ABNORMAL HIGH (ref 70–99)
Potassium: 4.5 mmol/L (ref 3.5–5.1)
SODIUM: 140 mmol/L (ref 135–145)

## 2018-01-02 MED ORDER — ASPIRIN 81 MG PO TBEC: 81.0000 mg | DELAYED_RELEASE_TABLET | Freq: Every day | ORAL | Status: DC

## 2018-01-02 MED ORDER — SPIRONOLACTONE 25 MG PO TABS: 12.5000 mg | ORAL_TABLET | Freq: Every day | ORAL | 1 refills | Status: DC

## 2018-01-02 MED ORDER — ATORVASTATIN CALCIUM 80 MG PO TABS: 80.0000 mg | ORAL_TABLET | Freq: Every day | ORAL | 1 refills | Status: DC

## 2018-01-02 MED ORDER — CARVEDILOL 3.125 MG PO TABS: 3.1250 mg | ORAL_TABLET | Freq: Two times a day (BID) | ORAL | 2 refills | Status: DC

## 2018-01-02 MED ORDER — CLOPIDOGREL BISULFATE 75 MG PO TABS: 75.0000 mg | ORAL_TABLET | Freq: Every day | ORAL | 2 refills | Status: DC

## 2018-01-02 MED ORDER — NITROGLYCERIN 0.4 MG SL SUBL: 0.4000 mg | SUBLINGUAL_TABLET | SUBLINGUAL | 2 refills | Status: DC | PRN

## 2018-01-02 NOTE — Discharge Summary (Signed)
Discharge Summary    Patient ID: Ray Smith,  MRN: [id number], DOB/AGE: 1946-03-31 72 y.o.  Admit date: 12/29/2017 Discharge date: 01/02/2018  Primary Care Provider: Patient, No Pcp Per Primary Cardiologist: Dr. Katrinka Blazing   Discharge Diagnoses    Active Problems:   STEMI (ST elevation myocardial infarction) [facility])   STEMI involving left anterior descending coronary artery (HCC)   Acute systolic heart failure (HCC)   Tobacco abuse   Dyslipidemia   Hypertension   Allergies Allergies  Allergen Reactions  . Tramadol Anaphylaxis  . Flexeril [Cyclobenzaprine] Other (See Comments)    Per patient "it made my heart stop"  . Ibuprofen Other (See Comments)    Overuse damages pt's kidneys   . Lactose Intolerance (Gi) Other (See Comments)    Digestive issues    Diagnostic Studies/Procedures    Cath: 12/30/17  Conclusion     Mid RCA lesion is 70% stenosed.  Mid LM to Dist LM lesion is 90% stenosed.  Ost LAD to Prox LAD lesion is 100% stenosed.  Ost Cx to Prox Cx lesion is 90% stenosed.  Prox Cx to Mid Cx lesion is 50% stenosed.  Prox LAD to Mid LAD lesion is 40% stenosed.  Mid LAD lesion is 50% stenosed.  Ost 1st Diag to 1st Diag lesion is 40% stenosed.  A drug-eluting stent was successfully placed using a STENT SIERRA 3.50 X 33 MM.  Post intervention, there is a 0% residual stenosis.  Post intervention, there is a 0% residual stenosis.  A drug-eluting stent was successfully placed using a STENT SIERRA 3.50 X 23 MM.  Post intervention, there is a 0% residual stenosis.  There is severe left ventricular systolic dysfunction.  LV end diastolic pressure is normal.  The left ventricular ejection fraction is 25-35% by visual estimate.   1.  Severe distal left main stenosis 2.  Acute total occlusion of the proximal LAD 3.  Severe ostial left circumflex stenosis 4.  Moderate mid RCA stenosis 5.  Severe segmental LV systolic dysfunction consistent with large  anterolateral infarction with LVEF estimated at 25% 6.  Successful complex PCI of the left mainstem/proximal LAD/proximal circumflex as outlined above  Recommend dual antiplatelet therapy with Aspirin 81mg  daily and Clopidogrel 75mg  daily long-term (beyond 12 months) because of complex left main bifurcation PCI in the setting of anterior MI.   TTE: 01/01/18  Study Conclusions  - Left ventricle: Systolic function was severely reduced. The   estimated ejection fraction was in the range of 25% to 30%.   Akinesis of the anteroseptal, anterior, anterolateral, lateral,   inferolateral, and apical myocardium. - Aortic valve: Valve mobility was mildly restricted. - Mitral valve: Calcified annulus. - Right ventricle: The cavity size was normal. Wall thickness was   increased. - No evidence of LV thrombus with the use of eco contrast to define   borders.  Impressions:  - Severely reduced LV ejection fraction with akinesis in multiple   territories consistent with LAD and circumflex disease. _____________   History of Present Illness     72 y.o. male w/ HTN, smoking, and severe CAD who presented as w/ STEMI.  Pt described intermitted chest pain for several months. Had been worsening over the past few days prior to admission. Acutely worsened the night of admission several hours before presentation. Called EMS. ECG in the field with RBBB and anterior ST elevations. Given aspirin, brought to ED.  In the ED, the patient expressed hesitancy about going to the cath  lab. He cited concern over the risk of the procedure as well as with taking plavix after the procedure. He eventually provided emergency verbal informed consent for coronary angiography and possible PCI.   Hospital Course     Underwent cardiac cath noted above with Dr. Excell Seltzer with distal left main stenosis, total occlusion ostial LAD and severe stenosis ostial Circumflex. This was treated with stenting from the left main into the  LAD and the Circumflex with an excellent result. Placed on DAPT with ASA/plavix indefinitely. He was also placed on high dose statin, and low dose BB. LV gram noted EF of <25%. Initially he refused to have follow up echo done, but did consent. Echo showed EF of 25-30%, akinesis of the anteroseptal, anterior, anterolateral, lateral, inferolateral, and apical myocardium. No LV thrombus noted. During admission struggled with issues on non compliance and was difficult with the staff. LDL noted at 130. Trop peaked at >65 x2. PT was consulted but he refused. No further chest pain post cath. Considered lifevest, but given his medical noncompliance would not consider him a good candidate for therapy as he would not wear the vest. Did reinforce the need to remain compliant with DAPT given his stents, and he expressed understanding of the medications.    General: Thin, disheveled older W male appearing in no acute distress. Head: Normocephalic, atraumatic.  Neck: Supple, no JVD. Lungs:  Resp regular and unlabored, CTA. Heart: RRR, S1, S2, no S3, S4, or murmur; no rub. Abdomen: Soft, non-tender, non-distended with normoactive bowel sounds.  Extremities: No clubbing, cyanosis, edema. Distal pedal pulses are 2+ bilaterally.  Neuro: Alert and oriented X 3. Moves all extremities spontaneously. Psych: Normal affect.  Ray Smith was seen by Dr. Excell Seltzer and determined stable for discharge home. Follow up in the office has been arranged. Medications are listed below.   _____________  Discharge Vitals Blood pressure (!) 130/96, pulse 95, temperature 98.1 F (36.7 C), temperature source Oral, resp. rate 13, height 5\' 10"  (1.778 m), weight 107 lb 12.9 oz (48.9 kg), SpO2 96 %.  Filed Weights   12/30/17 0000 12/30/17 0345 01/02/18 0556  Weight: 121 lb 4.1 oz (55 kg) 122 lb 5.7 oz (55.5 kg) 107 lb 12.9 oz (48.9 kg)    Labs & Radiologic Studies    CBC No results for input(s): WBC, NEUTROABS, HGB, HCT, MCV, PLT  in the last 72 hours. Basic Metabolic Panel Recent Labs    30/86/57 0940 01/02/18 0527  NA 138 140  K 4.0 4.5  CL 105 107  CO2 25 25  GLUCOSE 120* 107*  BUN 20 20  CREATININE 1.29* 1.40*  CALCIUM 9.0 9.0   Liver Function Tests No results for input(s): AST, ALT, ALKPHOS, BILITOT, PROT, ALBUMIN in the last 72 hours. No results for input(s): LIPASE, AMYLASE in the last 72 hours. Cardiac Enzymes Recent Labs    12/30/17 1524  TROPONINI >65.00*   BNP Invalid input(s): POCBNP D-Dimer No results for input(s): DDIMER in the last 72 hours. Hemoglobin A1C No results for input(s): HGBA1C in the last 72 hours. Fasting Lipid Panel No results for input(s): CHOL, HDL, LDLCALC, TRIG, CHOLHDL, LDLDIRECT in the last 72 hours. Thyroid Function Tests No results for input(s): TSH, T4TOTAL, T3FREE, THYROIDAB in the last 72 hours.  Invalid input(s): FREET3 _____________  Dg Chest Port 1 View  Result Date: 12/30/2017 CLINICAL DATA:  Chest pain EXAM: PORTABLE CHEST 1 VIEW COMPARISON:  None. FINDINGS: Heart size and pulmonary vascularity are normal. Diffuse interstitial  pattern throughout the lungs. This could indicate chronic fibrosis or acute edema or pneumonitis. Patchy focal infiltration in the right upper lung suggesting focal pneumonia. No blunting of costophrenic angles. No pneumothorax. Mediastinal contours appear intact. Calcification of the aorta. IMPRESSION: Diffuse interstitial pattern to the lungs could represent chronic fibrosis or acute edema/pneumonitis. Focal airspace disease in the right upper lung suggesting pneumonia. Electronically Signed   By: Burman Nieves M.D.   On: 12/30/2017 05:47   Disposition   Pt is being discharged home today in good condition.  Follow-up Plans & Appointments     Discharge Instructions    Amb Referral to Cardiac Rehabilitation   Complete by:  As directed    Diagnosis:   Coronary Stents STEMI         Discharge Medications       Medication List    STOP taking these medications   HYDROcodone-acetaminophen 5-325 MG tablet Commonly known as:  NORCO/VICODIN   lovastatin 20 MG tablet Commonly known as:  MEVACOR   ondansetron 4 MG tablet Commonly known as:  ZOFRAN   pantoprazole 40 MG tablet Commonly known as:  PROTONIX   traMADol 50 MG tablet Commonly known as:  ULTRAM     TAKE these medications   aspirin 81 MG EC tablet Take 1 tablet (81 mg total) by mouth daily. Start taking on:  01/03/2018 What changed:    medication strength  how much to take   atorvastatin 80 MG tablet Commonly known as:  LIPITOR Take 1 tablet (80 mg total) by mouth daily at 6 PM.   carvedilol 3.125 MG tablet Commonly known as:  COREG Take 1 tablet (3.125 mg total) by mouth 2 (two) times daily with a meal.   clopidogrel 75 MG tablet Commonly known as:  PLAVIX Take 1 tablet (75 mg total) by mouth daily. Start taking on:  01/03/2018   nitroGLYCERIN 0.4 MG SL tablet Commonly known as:  NITROSTAT Place 1 tablet (0.4 mg total) under the tongue every 5 (five) minutes x 3 doses as needed for chest pain. What changed:    how much to take  how to take this  when to take this  reasons to take this  additional instructions   spironolactone 25 MG tablet Commonly known as:  ALDACTONE Take 0.5 tablets (12.5 mg total) by mouth daily. Start taking on:  01/03/2018        Acute coronary syndrome (MI, NSTEMI, STEMI, etc) this admission?: Yes.     AHA/ACC Clinical Performance & Quality Measures: 1. Aspirin prescribed? - Yes 2. ADP Receptor Inhibitor (Plavix/Clopidogrel, Brilinta/Ticagrelor or Effient/Prasugrel) prescribed (includes medically managed patients)? - Yes 3. Beta Blocker prescribed? - Yes 4. High Intensity Statin (Lipitor 40-80mg  or Crestor 20-40mg ) prescribed? - Yes 5. EF assessed during THIS hospitalization? - Yes 6. For EF <40%, was ACEI/ARB prescribed? - No - Reason:  consider as outpatient if Cr remains  stable.  7. For EF <40%, Aldosterone Antagonist (Spironolactone or Eplerenone) prescribed? - Yes 8. Cardiac Rehab Phase II ordered (Included Medically managed Patients)? - Yes   Outstanding Labs/Studies   FLP/LFTs in 6 weeks if he remains on statin therapy. BMET at follow up appt.   Duration of Discharge Encounter   Greater than 30 minutes including physician time.  Signed, Laverda Page NP-C 01/02/2018, 11:47 AM  Patient seen, examined. Available data reviewed. Agree with findings, assessment, and plan as outlined by Laverda Page, NP.  On my exam the patient is an elderly, frail gentleman in no  distress.  His lungs are clear, heart is regular rate and rhythm with no murmur, abdomen is soft and nontender, extremities have no edema.  The patient is feeling well.  Issues as outlined above with long history of medical noncompliance.  I had a lengthy conversation with him about the importance of dual antiplatelet therapy with aspirin and clopidogrel after complex stenting of the left mainstem to treat an acute anterior infarct.  He tells me that he will take his medication.  He understands  the potential consequence of massive heart attack and sudden death without taking aspirin and clopidogrel.  Other medications as outlined above.  He is tolerating a low-dose of carvedilol and Spironolactone.  His blood pressure is intermittently quite low and I do not think he will tolerate an ACE inhibitor or ARB at this point.  Agree that he should have a follow-up metabolic panel next week after initiating Spironolactone in the hospital.  He is on a high intensity statin drug with atorvastatin 80 mg.  As outlined above, the patient is not a candidate for a LifeVest and certainly would never entertain the idea of an ICD based on all of our discussions.  He would not be a candidate for any advanced therapies.  Hopefully he will do well with medical therapy now that he has been revascularized.  Tonny Bollman, M.D. 01/02/2018 1:09 PM
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
## 2018-01-02 NOTE — Plan of Care (Signed)
  Problem: Cardiac: Goal: Vascular access site(s) Level 0-1 will be maintained Outcome: Not Applicable  Patient did not have cardiac cath.
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
## 2018-01-02 NOTE — Care Management Important Message (Signed)
Important Message  Patient Details  Name: Ray Smith MRN: [id number] Date of Birth: 1946-01-04   Medicare Important Message Given:  Yes Patient asleep, unsigned copy left at bedside.   [name] [name] 01/02/2018, 10:20 AM

## 2018-01-02 NOTE — Care Management Note (Addendum)
Case Management Note  Patient Details  Name: Ray Smith MRN: [id number] Date of Birth: 06-04-1946  Subjective/Objective:  CAD, s/p stents                 Action/Plan: NCM spoke to pt and son, [name] at bedside. Pt does not have drug coverage. Explained to son the importance of signing up with Medicare D. Pt states he cannot afford. States he only gets $500 a month. Provided son with information to follow up with DSS and SSA to check on Medicaid and SS benefits. Also to see if he would qualify for food stamps. Provided pt with MATCH with once per year use and $3 copay. Has cane at home.    Expected Discharge Date:  01/02/18               Expected Discharge Plan:  Home/Self Care  In-House Referral:  NA  Discharge planning Services  CM Consult, MATCH Program, Medication Assistance  Post Acute Care Choice:  NA Choice offered to:  NA  DME Arranged:  N/A DME Agency:  NA  HH Arranged: declined HH HH Agency:  NA  Status of Service:  Completed, signed off  If discussed at Long Length of Stay Meetings, dates discussed:    Additional Comments:  Elliot Cousin, RN 01/02/2018, 5:28 PM

## 2018-01-03 ENCOUNTER — Telehealth: Payer: Self-pay

## 2018-01-03 NOTE — Telephone Encounter (Signed)
-----   Message from Leotis Pain sent at 01/03/2018  7:59 AM EDT ----- Regarding: FW: TOC f/u call FYI ----- Message ----- From: Arty Baumgartner, NP Sent: 01/03/2018   7:13 AM To: Cv Div Nl Scheduling Subject: TOC f/u call                                   Pt needs follow up TOC call. Thx

## 2018-01-03 NOTE — Telephone Encounter (Signed)
LMTB to advised of TOC appointment.  Left call back number.    Appointment with Corine Shelter, PA- 01/09/2018 @ 11:30.
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
## 2018-01-09 ENCOUNTER — Emergency Department (HOSPITAL_COMMUNITY): Payer: Medicare Other

## 2018-01-09 ENCOUNTER — Ambulatory Visit: Payer: Medicare Other | Admitting: Cardiology

## 2018-01-09 ENCOUNTER — Inpatient Hospital Stay (HOSPITAL_COMMUNITY)
Admission: EM | Admit: 2018-01-09 | Discharge: 2018-01-19 | DRG: 157 | Disposition: A | Discharge status: Home / Self Care | Payer: Medicare Other | Attending: Internal Medicine | Admitting: Internal Medicine

## 2018-01-09 ENCOUNTER — Encounter (HOSPITAL_COMMUNITY): Payer: Self-pay | Admitting: Internal Medicine

## 2018-01-09 ENCOUNTER — Other Ambulatory Visit: Payer: Self-pay

## 2018-01-09 DIAGNOSIS — E785 Hyperlipidemia, unspecified: Secondary | ICD-10-CM | POA: Diagnosis present

## 2018-01-09 DIAGNOSIS — K140 Glossitis: Secondary | ICD-10-CM | POA: Diagnosis not present

## 2018-01-09 DIAGNOSIS — R627 Adult failure to thrive: Secondary | ICD-10-CM | POA: Diagnosis present

## 2018-01-09 DIAGNOSIS — K1379 Other lesions of oral mucosa: Secondary | ICD-10-CM | POA: Diagnosis present

## 2018-01-09 DIAGNOSIS — R531 Weakness: Secondary | ICD-10-CM

## 2018-01-09 DIAGNOSIS — R079 Chest pain, unspecified: Secondary | ICD-10-CM

## 2018-01-09 DIAGNOSIS — F1210 Cannabis abuse, uncomplicated: Secondary | ICD-10-CM | POA: Diagnosis present

## 2018-01-09 DIAGNOSIS — G8929 Other chronic pain: Secondary | ICD-10-CM | POA: Diagnosis present

## 2018-01-09 DIAGNOSIS — Z7982 Long term (current) use of aspirin: Secondary | ICD-10-CM

## 2018-01-09 DIAGNOSIS — Z885 Allergy status to narcotic agent status: Secondary | ICD-10-CM

## 2018-01-09 DIAGNOSIS — E739 Lactose intolerance, unspecified: Secondary | ICD-10-CM | POA: Diagnosis present

## 2018-01-09 DIAGNOSIS — I25118 Atherosclerotic heart disease of native coronary artery with other forms of angina pectoris: Secondary | ICD-10-CM

## 2018-01-09 DIAGNOSIS — Z9181 History of falling: Secondary | ICD-10-CM

## 2018-01-09 DIAGNOSIS — Z955 Presence of coronary angioplasty implant and graft: Secondary | ICD-10-CM

## 2018-01-09 DIAGNOSIS — I2109 ST elevation (STEMI) myocardial infarction involving other coronary artery of anterior wall: Secondary | ICD-10-CM | POA: Diagnosis not present

## 2018-01-09 DIAGNOSIS — N183 Chronic kidney disease, stage 3 (moderate): Secondary | ICD-10-CM | POA: Diagnosis present

## 2018-01-09 DIAGNOSIS — Z9119 Patient's noncompliance with other medical treatment and regimen: Secondary | ICD-10-CM

## 2018-01-09 DIAGNOSIS — I4510 Unspecified right bundle-branch block: Secondary | ICD-10-CM | POA: Diagnosis present

## 2018-01-09 DIAGNOSIS — R748 Abnormal levels of other serum enzymes: Secondary | ICD-10-CM | POA: Diagnosis not present

## 2018-01-09 DIAGNOSIS — Z7902 Long term (current) use of antithrombotics/antiplatelets: Secondary | ICD-10-CM

## 2018-01-09 DIAGNOSIS — I2102 ST elevation (STEMI) myocardial infarction involving left anterior descending coronary artery: Secondary | ICD-10-CM | POA: Diagnosis present

## 2018-01-09 DIAGNOSIS — I951 Orthostatic hypotension: Secondary | ICD-10-CM | POA: Diagnosis present

## 2018-01-09 DIAGNOSIS — B002 Herpesviral gingivostomatitis and pharyngotonsillitis: Secondary | ICD-10-CM | POA: Diagnosis not present

## 2018-01-09 DIAGNOSIS — I2510 Atherosclerotic heart disease of native coronary artery without angina pectoris: Secondary | ICD-10-CM | POA: Diagnosis present

## 2018-01-09 DIAGNOSIS — N182 Chronic kidney disease, stage 2 (mild): Secondary | ICD-10-CM | POA: Diagnosis present

## 2018-01-09 DIAGNOSIS — Z681 Body mass index (BMI) 19 or less, adult: Secondary | ICD-10-CM

## 2018-01-09 DIAGNOSIS — J449 Chronic obstructive pulmonary disease, unspecified: Secondary | ICD-10-CM | POA: Diagnosis present

## 2018-01-09 DIAGNOSIS — Z66 Do not resuscitate: Secondary | ICD-10-CM | POA: Diagnosis present

## 2018-01-09 DIAGNOSIS — F17210 Nicotine dependence, cigarettes, uncomplicated: Secondary | ICD-10-CM | POA: Diagnosis present

## 2018-01-09 DIAGNOSIS — E1122 Type 2 diabetes mellitus with diabetic chronic kidney disease: Secondary | ICD-10-CM | POA: Diagnosis present

## 2018-01-09 DIAGNOSIS — I255 Ischemic cardiomyopathy: Secondary | ICD-10-CM | POA: Diagnosis present

## 2018-01-09 DIAGNOSIS — I5022 Chronic systolic (congestive) heart failure: Secondary | ICD-10-CM | POA: Diagnosis present

## 2018-01-09 DIAGNOSIS — R1310 Dysphagia, unspecified: Secondary | ICD-10-CM

## 2018-01-09 DIAGNOSIS — I10 Essential (primary) hypertension: Secondary | ICD-10-CM | POA: Diagnosis present

## 2018-01-09 DIAGNOSIS — K224 Dyskinesia of esophagus: Secondary | ICD-10-CM | POA: Diagnosis present

## 2018-01-09 DIAGNOSIS — Z888 Allergy status to other drugs, medicaments and biological substances status: Secondary | ICD-10-CM

## 2018-01-09 DIAGNOSIS — I130 Hypertensive heart and chronic kidney disease with heart failure and stage 1 through stage 4 chronic kidney disease, or unspecified chronic kidney disease: Secondary | ICD-10-CM | POA: Diagnosis present

## 2018-01-09 DIAGNOSIS — E876 Hypokalemia: Secondary | ICD-10-CM | POA: Diagnosis not present

## 2018-01-09 DIAGNOSIS — E43 Unspecified severe protein-calorie malnutrition: Secondary | ICD-10-CM | POA: Diagnosis present

## 2018-01-09 DIAGNOSIS — F4325 Adjustment disorder with mixed disturbance of emotions and conduct: Secondary | ICD-10-CM | POA: Diagnosis not present

## 2018-01-09 DIAGNOSIS — R45851 Suicidal ideations: Secondary | ICD-10-CM | POA: Diagnosis not present

## 2018-01-09 DIAGNOSIS — Z886 Allergy status to analgesic agent status: Secondary | ICD-10-CM

## 2018-01-09 DIAGNOSIS — R45850 Homicidal ideations: Secondary | ICD-10-CM | POA: Diagnosis not present

## 2018-01-09 DIAGNOSIS — H9190 Unspecified hearing loss, unspecified ear: Secondary | ICD-10-CM | POA: Diagnosis present

## 2018-01-09 HISTORY — DX: Personal history of urinary calculi: Z87.442

## 2018-01-09 HISTORY — DX: Acute systolic (congestive) heart failure: I50.21

## 2018-01-09 HISTORY — DX: Cannabis abuse, uncomplicated: F12.10

## 2018-01-09 HISTORY — DX: Nicotine dependence, unspecified, uncomplicated: F17.200

## 2018-01-09 HISTORY — DX: ST elevation (STEMI) myocardial infarction involving left anterior descending coronary artery: I21.02

## 2018-01-09 LAB — BASIC METABOLIC PANEL
ANION GAP: 9 (ref 5–15)
BUN: 19 mg/dL (ref 8–23)
CALCIUM: 9 mg/dL (ref 8.9–10.3)
CO2: 22 mmol/L (ref 22–32)
CREATININE: 1.31 mg/dL — AB (ref 0.61–1.24)
Chloride: 103 mmol/L (ref 98–111)
GFR calc Af Amer: >60 mL/min (ref >60)
GFR, EST NON AFRICAN AMERICAN: 52 mL/min — AB (ref >60)
GLUCOSE: 127 mg/dL — AB (ref 70–99)
Potassium: 3.6 mmol/L (ref 3.5–5.1)
Sodium: 134 mmol/L — ABNORMAL LOW (ref 135–145)

## 2018-01-09 LAB — RAPID URINE DRUG SCREEN, HOSP PERFORMED
AMPHETAMINES: NOT DETECTED
Benzodiazepines: POSITIVE — AB
Cocaine: NOT DETECTED
Opiates: NOT DETECTED
Tetrahydrocannabinol: POSITIVE — AB

## 2018-01-09 LAB — CBC
HCT: 37.7 % — ABNORMAL LOW (ref 39.0–52.0)
Hemoglobin: 11.9 g/dL — ABNORMAL LOW (ref 13.0–17.0)
MCH: 27.4 pg (ref 26.0–34.0)
MCHC: 31.6 g/dL (ref 30.0–36.0)
MCV: 86.7 fL (ref 78.0–100.0)
PLATELETS: 361 10*3/uL (ref 150–400)
RBC: 4.35 MIL/uL (ref 4.22–5.81)
RDW: 14.6 % (ref 11.5–15.5)
WBC: 12 10*3/uL — ABNORMAL HIGH (ref 4.0–10.5)

## 2018-01-09 LAB — I-STAT TROPONIN, ED: TROPONIN I, POC: 2.1 ng/mL — AB (ref 0.00–0.08)

## 2018-01-09 MED ORDER — CARVEDILOL 3.125 MG PO TABS
3.1250 mg | ORAL_TABLET | Freq: Two times a day (BID) | ORAL | Status: DC
  Administered 2018-01-09 – 2018-01-19 (×21): 3.125 mg via ORAL
  Filled 2018-01-09 (×21): qty 1

## 2018-01-09 MED ORDER — ASPIRIN EC 81 MG PO TBEC
81.0000 mg | DELAYED_RELEASE_TABLET | Freq: Every day | ORAL | Status: DC
  Administered 2018-01-10 – 2018-01-19 (×10): 81 mg via ORAL
  Filled 2018-01-09 (×10): qty 1

## 2018-01-09 MED ORDER — SODIUM CHLORIDE 0.9% FLUSH
3.0000 mL | Freq: Two times a day (BID) | INTRAVENOUS | Status: DC
  Administered 2018-01-10 – 2018-01-19 (×16): 3 mL via INTRAVENOUS

## 2018-01-09 MED ORDER — ATORVASTATIN CALCIUM 20 MG PO TABS
80.0000 mg | ORAL_TABLET | Freq: Every day | ORAL | Status: DC
  Administered 2018-01-10 – 2018-01-19 (×9): 80 mg via ORAL
  Filled 2018-01-09 (×5): qty 4
  Filled 2018-01-09: qty 8
  Filled 2018-01-09 (×5): qty 4

## 2018-01-09 MED ORDER — ONDANSETRON HCL 4 MG PO TABS: 4.0000 mg | ORAL_TABLET | Freq: Four times a day (QID) | ORAL | Status: DC | PRN

## 2018-01-09 MED ORDER — HEPARIN BOLUS VIA INFUSION
3000.0000 [IU] | Freq: Once | INTRAVENOUS | Status: AC
  Administered 2018-01-09: 3000 [IU] via INTRAVENOUS
  Filled 2018-01-09: qty 3000

## 2018-01-09 MED ORDER — CLOPIDOGREL BISULFATE 75 MG PO TABS
300.0000 mg | ORAL_TABLET | Freq: Once | ORAL | Status: DC
  Filled 2018-01-09: qty 1

## 2018-01-09 MED ORDER — HEPARIN (PORCINE) IN NACL 100-0.45 UNIT/ML-% IJ SOLN
600.0000 [IU]/h | INTRAMUSCULAR | Status: DC
  Administered 2018-01-09 (×2): 600 [IU]/h via INTRAVENOUS
  Filled 2018-01-09: qty 250

## 2018-01-09 MED ORDER — ACETAMINOPHEN 650 MG RE SUPP: 650.0000 mg | Freq: Four times a day (QID) | RECTAL | Status: DC | PRN

## 2018-01-09 MED ORDER — MAGIC MOUTHWASH W/LIDOCAINE
5.0000 mL | Freq: Four times a day (QID) | ORAL | Status: DC
  Administered 2018-01-09 – 2018-01-18 (×33): 5 mL via ORAL
  Filled 2018-01-09 (×38): qty 5

## 2018-01-09 MED ORDER — ACETAMINOPHEN 325 MG PO TABS: 650.0000 mg | ORAL_TABLET | Freq: Four times a day (QID) | ORAL | Status: DC | PRN

## 2018-01-09 MED ORDER — DOCUSATE SODIUM 100 MG PO CAPS
100.0000 mg | ORAL_CAPSULE | Freq: Two times a day (BID) | ORAL | Status: DC
  Administered 2018-01-09 – 2018-01-17 (×15): 100 mg via ORAL
  Filled 2018-01-09 (×15): qty 1

## 2018-01-09 MED ORDER — LACTATED RINGERS IV SOLN
INTRAVENOUS | Status: AC
  Administered 2018-01-09 – 2018-01-10 (×2): via INTRAVENOUS

## 2018-01-09 MED ORDER — ONDANSETRON HCL 4 MG/2ML IJ SOLN: 4.0000 mg | Freq: Four times a day (QID) | INTRAMUSCULAR | Status: DC | PRN

## 2018-01-09 MED ORDER — CLOPIDOGREL BISULFATE 75 MG PO TABS
75.0000 mg | ORAL_TABLET | Freq: Every day | ORAL | Status: DC
  Administered 2018-01-10 – 2018-01-19 (×10): 75 mg via ORAL
  Filled 2018-01-09 (×11): qty 1

## 2018-01-09 MED ORDER — ENOXAPARIN SODIUM 40 MG/0.4ML ~~LOC~~ SOLN
40.0000 mg | SUBCUTANEOUS | Status: DC
  Filled 2018-01-09 (×2): qty 0.4

## 2018-01-09 MED ORDER — SPIRONOLACTONE 12.5 MG HALF TABLET
12.5000 mg | ORAL_TABLET | Freq: Every day | ORAL | Status: DC
  Administered 2018-01-10 – 2018-01-19 (×10): 12.5 mg via ORAL
  Filled 2018-01-09 (×10): qty 1

## 2018-01-09 MED ORDER — ENOXAPARIN SODIUM 40 MG/0.4ML ~~LOC~~ SOLN: 40.0000 mg | SUBCUTANEOUS | Status: DC

## 2018-01-09 NOTE — ED Notes (Signed)
Huel Coventry NP with cards at bedside.
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
## 2018-01-09 NOTE — ED Notes (Signed)
ED Provider at bedside. 

## 2018-01-09 NOTE — ED Provider Notes (Signed)
[facility] EMERGENCY DEPARTMENT Provider Note   CSN: [id number] Arrival date & time: 01/09/18  1025     History   Chief Complaint Chief Complaint  Patient presents with  . Chest Pain    HPI Ray Smith is a 72 y.o. male.  72 yo M with a chief complaint of chest pain.  Going on since last night.  Patient was recently here 10 days ago and diagnosed with a STEMI of the LAD.  Had a stent placed and was discharged home.  Since then the patient feels that his care at home has not been that good.  Last night he started having chest pain that was worse upon him trying to stand was too short of breath to make it to a phone and eventually was able to call 911 this morning.  He feels that the pain is different than his chest pain about 10 days ago.  That was left-sided this 1 is right-sided and radiates to his right shoulder.  He is short of breath with it.  Denies diaphoresis or vomiting.  The history is provided by the patient.  Chest Pain   This is a new problem. The current episode started yesterday. The problem occurs constantly. The problem has not changed since onset.The pain is associated with exertion. The pain is present in the lateral region. The pain is at a severity of 7/10. The pain is moderate. The quality of the pain is described as exertional and heavy. The pain radiates to the right shoulder. Duration of episode(s) is 1 day. Associated symptoms include shortness of breath. Pertinent negatives include no abdominal pain, no fever, no headaches, no palpitations and no vomiting. He has tried nothing for the symptoms. The treatment provided no relief. Risk factors include smoking/tobacco exposure.  His past medical history is significant for diabetes, hyperlipidemia, hypertension and MI.    Past Medical History:  Diagnosis Date  . Coronary artery disease    a.  presented with CP and inf STE but no ACS - LHC (10/23/13):  Dist LM 40-60%, ostial LAD 80%, mid LAD 50%,  ostial CFX 50-70%, mid RCA 50%.  EF 60%. - CP not felt to be ischemic; CABG vs Med Rx d/w pt - pt opted for Med Rx  . Hyperlipidemia   . Hypertension     Patient Active Problem List   Diagnosis Date Noted  . Acute systolic heart failure (HCC) 01/02/2018  . STEMI (ST elevation myocardial infarction) (HCC) 12/30/2017  . STEMI involving left anterior descending coronary artery (HCC) 12/30/2017  . Acute encephalopathy 10/06/2017  . AKI (acute kidney injury) (HCC)   . Altered mental status   . Hypertension 11/12/2013  . Dyslipidemia 10/24/2013  . ACS (acute coronary syndrome) (HCC) 10/23/2013  . Chest pain 10/23/2013  . Coronary Artery Disease 10/23/2013  . COPD (chronic obstructive pulmonary disease) (HCC) 10/23/2013  . Tobacco abuse 10/23/2013  . Precordial pain 10/23/2013    Past Surgical History:  Procedure Laterality Date  . CORONARY/GRAFT ACUTE MI REVASCULARIZATION N/A 12/30/2017   Procedure: Coronary/Graft Acute MI Revascularization;  Surgeon: Tonny Bollman, MD;  Location: [facility] INVASIVE CV LAB;  Service: Cardiovascular;  Laterality: N/A;  Distal LM into LAD Xience [place] 3.5x68mm Ostial Cx Xience [place] 3.5x38mm  . LEFT HEART CATH AND CORONARY ANGIOGRAPHY N/A 12/30/2017   Procedure: LEFT HEART CATH AND CORONARY ANGIOGRAPHY;  Surgeon: Tonny Bollman, MD;  Location: [facility] INVASIVE CV LAB;  Service: Cardiovascular;  Laterality: N/A;  . LEFT HEART CATHETERIZATION  WITH CORONARY ANGIOGRAM Bilateral 10/23/2013   Procedure: LEFT HEART CATHETERIZATION WITH CORONARY ANGIOGRAM;  Surgeon: Lesleigh Noe, MD;  Location: [facility] CATH LAB;  Service: Cardiovascular;  Laterality: Bilateral;  . snake bite surgery          Home Medications    Prior to Admission medications   Medication Sig Start Date End Date Taking? Authorizing Provider  aspirin EC 81 MG EC tablet Take 1 tablet (81 mg total) by mouth daily. 01/03/18   Arty Baumgartner, NP  atorvastatin (LIPITOR) 80 MG tablet Take 1 tablet (80 mg  total) by mouth daily at 6 PM. 01/02/18   Arty Baumgartner, NP  carvedilol (COREG) 3.125 MG tablet Take 1 tablet (3.125 mg total) by mouth 2 (two) times daily with a meal. 01/02/18   Arty Baumgartner, NP  clopidogrel (PLAVIX) 75 MG tablet Take 1 tablet (75 mg total) by mouth daily. 01/03/18   Arty Baumgartner, NP  nitroGLYCERIN (NITROSTAT) 0.4 MG SL tablet Place 1 tablet (0.4 mg total) under the tongue every 5 (five) minutes x 3 doses as needed for chest pain. 01/02/18   Arty Baumgartner, NP  spironolactone (ALDACTONE) 25 MG tablet Take 0.5 tablets (12.5 mg total) by mouth daily. 01/03/18   Arty Baumgartner, NP    Family History No family history on file.  Social History Social History   Tobacco Use  . Smoking status: Current Some Day Smoker    Types: Cigarettes  . Smokeless tobacco: Never Used  Substance Use Topics  . Alcohol use: No  . Drug use: No     Allergies   Tramadol; Flexeril [cyclobenzaprine]; Ibuprofen; and Lactose intolerance (gi)   Review of Systems Review of Systems  Constitutional: Positive for fatigue. Negative for chills and fever.  HENT: Negative for congestion and facial swelling.   Eyes: Negative for discharge and visual disturbance.  Respiratory: Positive for shortness of breath.   Cardiovascular: Positive for chest pain. Negative for palpitations.  Gastrointestinal: Negative for abdominal pain, diarrhea and vomiting.  Musculoskeletal: Negative for arthralgias and myalgias.  Skin: Negative for color change and rash.  Neurological: Negative for tremors, syncope and headaches.  Psychiatric/Behavioral: Negative for confusion and dysphoric mood.     Physical Exam Updated Vital Signs BP (!) 125/96   Pulse (!) 31   Temp 98.3 F (36.8 C) (Oral)   Resp (!) 23   SpO2 100%   Physical Exam  Constitutional: He is oriented to person, place, and time. He appears well-developed and well-nourished.  Cachectic  HENT:  Head: Normocephalic and atraumatic.   Eyes: Pupils are equal, round, and reactive to light. EOM are normal.  Neck: Normal range of motion. Neck supple. No JVD present.  Cardiovascular: Normal rate and regular rhythm. Exam reveals no gallop and no friction rub.  No murmur heard. Pulmonary/Chest: No respiratory distress. He has no wheezes.  Abdominal: He exhibits no distension. There is no rebound and no guarding.  Musculoskeletal: Normal range of motion.  Neurological: He is alert and oriented to person, place, and time.  Skin: No rash noted. No pallor.  Psychiatric: He has a normal mood and affect. His behavior is normal.  Nursing note and vitals reviewed.    ED Treatments / Results  Labs (all labs ordered are listed, but only abnormal results are displayed) Labs Reviewed  BASIC METABOLIC PANEL - Abnormal; Notable for the following components:      Result Value   Sodium 134 (*)  Glucose, Bld 127 (*)    Creatinine, Ser 1.31 (*)    GFR calc non Af Amer 52 (*)    All other components within normal limits  CBC - Abnormal; Notable for the following components:   WBC 12.0 (*)    Hemoglobin 11.9 (*)    HCT 37.7 (*)    All other components within normal limits  I-STAT TROPONIN, ED - Abnormal; Notable for the following components:   Troponin i, poc 2.10 (*)    All other components within normal limits  HEPARIN LEVEL (UNFRACTIONATED)    EKG EKG Interpretation  Date/Time:  Wednesday January 09 2018 10:28:09 EDT Ventricular Rate:  86 PR Interval:    QRS Duration: 149 QT Interval:  414 QTC Calculation: 496 R Axis:   -111 Text Interpretation:  Sinus rhythm Right bundle branch block Anterior infarct, old No significant change since last tracing Confirmed by Melene Plan [id number]) on 01/09/2018 10:32:01 AM   Radiology Dg Chest 2 View  Result Date: 01/09/2018 CLINICAL DATA:  Chest pain. EXAM: CHEST - 2 VIEW COMPARISON:  Chest x-ray dated December 30, 2017. FINDINGS: The heart size and mediastinal contours are within normal  limits. Normal pulmonary vascularity. Atherosclerotic calcification of the aortic arch. The lungs remain hyperinflated with increased interstitial markings. No focal consolidation, pleural effusion, or pneumothorax. No acute osseous abnormality. IMPRESSION: 1. COPD and chronic lung disease. No active cardiopulmonary disease. 2.  Aortic atherosclerosis (ICD10-I70.0). Electronically Signed   By: Obie Dredge M.D.   On: 01/09/2018 11:11    Procedures Procedures (including critical care time)  Medications Ordered in ED Medications  heparin ADULT infusion 100 units/mL (25000 units/245mL sodium chloride 0.45%) (600 Units/hr Intravenous New Bag/Given 01/09/18 1131)  clopidogrel (PLAVIX) tablet 300 mg (has no administration in time range)  heparin bolus via infusion 3,000 Units (3,000 Units Intravenous Bolus from Bag 01/09/18 1131)     Initial Impression / Assessment and Plan / ED Course  I have reviewed the triage vital signs and the nursing notes.  Pertinent labs & imaging results that were available during my care of the patient were reviewed by me and considered in my medical decision making (see chart for details).     72 yo M with a chief complaint of chest pain and shortness of breath.  This has been going on since last night.  Described as right-sided and radiates to his right shoulder.  Time he gets up to try and move he is too fatigued to actually go anywhere.  His chest pain is resolved with one nitroglycerin.  EKG looks similar to his discharge EKG.  Patient was just in the hospital a week ago and had a STEMI with a stent placed.  I discussed the case with Dr. Jim Like, he independently evaluated the EKG and did not feel that there was a obvious ischemic change from prior.  Cards to see in the ED.   Seen by cards, feel patient trop down trending.  Recommend hospitalist admission for serial trops.   CRITICAL CARE Performed by: Rae Roam   Total critical care time: 35  minutes  Critical care time was exclusive of separately billable procedures and treating other patients.  Critical care was necessary to treat or prevent imminent or life-threatening deterioration.  Critical care was time spent personally by me on the following activities: development of treatment plan with patient and/or surrogate as well as nursing, discussions with consultants, evaluation of patient's response to treatment, examination of patient, obtaining history from  patient or surrogate, ordering and performing treatments and interventions, ordering and review of laboratory studies, ordering and review of radiographic studies, pulse oximetry and re-evaluation of patient's condition.   The patients results and plan were reviewed and discussed.   Any x-rays performed were independently reviewed by myself.   Differential diagnosis were considered with the presenting HPI.  Medications  heparin ADULT infusion 100 units/mL (25000 units/240mL sodium chloride 0.45%) (600 Units/hr Intravenous New Bag/Given 01/09/18 1131)  clopidogrel (PLAVIX) tablet 300 mg (has no administration in time range)  heparin bolus via infusion 3,000 Units (3,000 Units Intravenous Bolus from Bag 01/09/18 1131)    Vitals:   01/09/18 1530 01/09/18 1545 01/09/18 1600 01/09/18 1615  BP: 117/88 (!) 114/92 (!) 127/93 (!) 125/96  Pulse: 86 85 (!) 28 (!) 31  Resp:  (!) 23    Temp:      TempSrc:      SpO2: 99% 100%      Final diagnoses:  Chest pain with high risk for cardiac etiology    Admission/ observation were discussed with the admitting physician, patient and/or family and they are comfortable with the plan.    Final Clinical Impressions(s) / ED Diagnoses   Final diagnoses:  Chest pain with high risk for cardiac etiology    ED Discharge Orders    None       Melene Plan, DO 01/09/18 1621

## 2018-01-09 NOTE — Progress Notes (Signed)
ANTICOAGULATION CONSULT NOTE - Initial Consult  Pharmacy Consult for Heparin Indication: chest pain/ACS  Allergies  Allergen Reactions   Tramadol Anaphylaxis   Flexeril [Cyclobenzaprine] Other (See Comments)    Per patient "it made my heart stop"   Ibuprofen Other (See Comments)    Overuse damages pt's kidneys    Lactose Intolerance (Gi) Other (See Comments)    Digestive issues    Patient Measurements:    Vital Signs: Temp: 98.3 F (36.8 C) (07/17 1033) Temp Source: Oral (07/17 1033) BP: 118/86 (07/17 1045) Pulse Rate: 86 (07/17 1045)  Labs: Recent Labs    01/09/18 1042  HGB 11.9*  HCT 37.7*  PLT 361  CREATININE 1.31*    Estimated Creatinine Clearance: 34.7 mL/min (A) (by C-G formula based on SCr of 1.31 mg/dL (H)).   Medical History: Past Medical History:  Diagnosis Date   Coronary artery disease    a.  presented with CP and inf STE but no ACS - LHC (10/23/13):  Dist LM 40-60%, ostial LAD 80%, mid LAD 50%, ostial CFX 50-70%, mid RCA 50%.  EF 60%. - CP not felt to be ischemic; CABG vs Med Rx d/w pt - pt opted for Med Rx   Hyperlipidemia    Hypertension    Assessment: 72 YO M presenting with CP. Pharmacy consulted to dose heparin for NSTEMI. Pt not on anticoagulation prior to admission. Hx of PCI 12/30/17.   CBC stable, Scr stable. No bleeding reported.  Goal of Therapy:  Heparin level 0.3-0.7 units/ml Monitor platelets by anticoagulation protocol: Yes   Plan:  Give 3000 units bolus x 1 Start heparin infusion at 600 units/hr  Heparin level in 8 hours Monitor heparin level, CBC and s/sx of bleeding daily  Lenward Chancellor, PharmD PGY1 Pharmacy Resident Phone [phone] 01/09/2018 11:37 AM

## 2018-01-09 NOTE — ED Notes (Signed)
MD Adela Lank made aware of elevated trop. Pt currently pain free.
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
## 2018-01-09 NOTE — ED Notes (Signed)
Admitting at bedside 

## 2018-01-10 ENCOUNTER — Encounter (HOSPITAL_COMMUNITY): Payer: Self-pay | Admitting: *Deleted

## 2018-01-10 DIAGNOSIS — R079 Chest pain, unspecified: Secondary | ICD-10-CM | POA: Diagnosis present

## 2018-01-10 DIAGNOSIS — E739 Lactose intolerance, unspecified: Secondary | ICD-10-CM | POA: Diagnosis present

## 2018-01-10 DIAGNOSIS — I2102 ST elevation (STEMI) myocardial infarction involving left anterior descending coronary artery: Secondary | ICD-10-CM | POA: Diagnosis present

## 2018-01-10 DIAGNOSIS — R531 Weakness: Secondary | ICD-10-CM

## 2018-01-10 DIAGNOSIS — N183 Chronic kidney disease, stage 3 (moderate): Secondary | ICD-10-CM | POA: Diagnosis present

## 2018-01-10 DIAGNOSIS — G8929 Other chronic pain: Secondary | ICD-10-CM | POA: Diagnosis present

## 2018-01-10 DIAGNOSIS — K1379 Other lesions of oral mucosa: Secondary | ICD-10-CM | POA: Diagnosis not present

## 2018-01-10 DIAGNOSIS — H9190 Unspecified hearing loss, unspecified ear: Secondary | ICD-10-CM | POA: Diagnosis present

## 2018-01-10 DIAGNOSIS — F1210 Cannabis abuse, uncomplicated: Secondary | ICD-10-CM | POA: Diagnosis present

## 2018-01-10 DIAGNOSIS — B002 Herpesviral gingivostomatitis and pharyngotonsillitis: Secondary | ICD-10-CM | POA: Diagnosis present

## 2018-01-10 DIAGNOSIS — R627 Adult failure to thrive: Secondary | ICD-10-CM | POA: Diagnosis present

## 2018-01-10 DIAGNOSIS — K140 Glossitis: Secondary | ICD-10-CM | POA: Diagnosis present

## 2018-01-10 DIAGNOSIS — E43 Unspecified severe protein-calorie malnutrition: Secondary | ICD-10-CM | POA: Diagnosis present

## 2018-01-10 DIAGNOSIS — I255 Ischemic cardiomyopathy: Secondary | ICD-10-CM | POA: Diagnosis present

## 2018-01-10 DIAGNOSIS — R45851 Suicidal ideations: Secondary | ICD-10-CM | POA: Diagnosis not present

## 2018-01-10 DIAGNOSIS — J449 Chronic obstructive pulmonary disease, unspecified: Secondary | ICD-10-CM | POA: Diagnosis present

## 2018-01-10 DIAGNOSIS — Z681 Body mass index (BMI) 19 or less, adult: Secondary | ICD-10-CM | POA: Diagnosis not present

## 2018-01-10 DIAGNOSIS — I4510 Unspecified right bundle-branch block: Secondary | ICD-10-CM | POA: Diagnosis present

## 2018-01-10 DIAGNOSIS — I130 Hypertensive heart and chronic kidney disease with heart failure and stage 1 through stage 4 chronic kidney disease, or unspecified chronic kidney disease: Secondary | ICD-10-CM | POA: Diagnosis present

## 2018-01-10 DIAGNOSIS — I2109 ST elevation (STEMI) myocardial infarction involving other coronary artery of anterior wall: Secondary | ICD-10-CM | POA: Diagnosis not present

## 2018-01-10 DIAGNOSIS — I25118 Atherosclerotic heart disease of native coronary artery with other forms of angina pectoris: Secondary | ICD-10-CM | POA: Diagnosis not present

## 2018-01-10 DIAGNOSIS — I5022 Chronic systolic (congestive) heart failure: Secondary | ICD-10-CM | POA: Diagnosis present

## 2018-01-10 DIAGNOSIS — K224 Dyskinesia of esophagus: Secondary | ICD-10-CM | POA: Diagnosis present

## 2018-01-10 DIAGNOSIS — I951 Orthostatic hypotension: Secondary | ICD-10-CM | POA: Diagnosis present

## 2018-01-10 DIAGNOSIS — E1122 Type 2 diabetes mellitus with diabetic chronic kidney disease: Secondary | ICD-10-CM | POA: Diagnosis present

## 2018-01-10 DIAGNOSIS — I2510 Atherosclerotic heart disease of native coronary artery without angina pectoris: Secondary | ICD-10-CM | POA: Diagnosis present

## 2018-01-10 DIAGNOSIS — Z66 Do not resuscitate: Secondary | ICD-10-CM | POA: Diagnosis present

## 2018-01-10 DIAGNOSIS — F17210 Nicotine dependence, cigarettes, uncomplicated: Secondary | ICD-10-CM | POA: Diagnosis present

## 2018-01-10 DIAGNOSIS — E785 Hyperlipidemia, unspecified: Secondary | ICD-10-CM | POA: Diagnosis present

## 2018-01-10 DIAGNOSIS — F4325 Adjustment disorder with mixed disturbance of emotions and conduct: Secondary | ICD-10-CM | POA: Diagnosis not present

## 2018-01-10 LAB — CBC
HEMATOCRIT: 35.9 % — AB (ref 39.0–52.0)
HEMOGLOBIN: 11.3 g/dL — AB (ref 13.0–17.0)
MCH: 27 pg (ref 26.0–34.0)
MCHC: 31.5 g/dL (ref 30.0–36.0)
MCV: 85.7 fL (ref 78.0–100.0)
Platelets: 300 10*3/uL (ref 150–400)
RBC: 4.19 MIL/uL — AB (ref 4.22–5.81)
RDW: 14.7 % (ref 11.5–15.5)
WBC: 8.4 10*3/uL (ref 4.0–10.5)

## 2018-01-10 LAB — BASIC METABOLIC PANEL
ANION GAP: 7 (ref 5–15)
BUN: 17 mg/dL (ref 8–23)
CALCIUM: 8.5 mg/dL — AB (ref 8.9–10.3)
CO2: 23 mmol/L (ref 22–32)
Chloride: 107 mmol/L (ref 98–111)
Creatinine, Ser: 1.23 mg/dL (ref 0.61–1.24)
GFR calc Af Amer: >60 mL/min (ref >60)
GFR, EST NON AFRICAN AMERICAN: 56 mL/min — AB (ref >60)
Glucose, Bld: 98 mg/dL (ref 70–99)
POTASSIUM: 3.8 mmol/L (ref 3.5–5.1)
Sodium: 137 mmol/L (ref 135–145)

## 2018-01-10 LAB — VITAMIN B12: VITAMIN B 12: 450 pg/mL (ref 180–914)

## 2018-01-10 MED ORDER — ZINC SULFATE 220 (50 ZN) MG PO CAPS
220.0000 mg | ORAL_CAPSULE | Freq: Every day | ORAL | Status: DC
  Administered 2018-01-10 – 2018-01-19 (×10): 220 mg via ORAL
  Filled 2018-01-10 (×10): qty 1

## 2018-01-10 MED ORDER — NYSTATIN 100000 UNIT/ML MT SUSP
5.0000 mL | Freq: Four times a day (QID) | OROMUCOSAL | Status: DC
  Administered 2018-01-10 – 2018-01-19 (×34): 500000 [IU] via ORAL
  Filled 2018-01-10 (×33): qty 5

## 2018-01-10 MED ORDER — ENOXAPARIN SODIUM 40 MG/0.4ML ~~LOC~~ SOLN
40.0000 mg | SUBCUTANEOUS | Status: DC
  Administered 2018-01-10: 40 mg via SUBCUTANEOUS

## 2018-01-10 MED ORDER — FOLIC ACID 1 MG PO TABS
1.0000 mg | ORAL_TABLET | Freq: Every day | ORAL | Status: DC
  Administered 2018-01-10 – 2018-01-19 (×10): 1 mg via ORAL
  Filled 2018-01-10 (×10): qty 1

## 2018-01-10 NOTE — Progress Notes (Signed)
Initial Nutrition Assessment  DOCUMENTATION CODES:   Severe malnutrition in context of chronic illness, Underweight  INTERVENTION:   - Recommend vitamin C supplementation due to findings consistent with vitamin C deficiency  - Magic cup TID with meals, each supplement provides 290 kcal and 9 grams of protein  - Ice cream TID with meals  - Encourage adequate PO intake  - Took pt preferences and added to [employer] software  NUTRITION DIAGNOSIS:   Severe Malnutrition related to chronic illness as evidenced by moderate fat depletion, severe fat depletion, severe muscle depletion, percent weight loss (10.2% weight loss in 3 months).  GOAL:   Patient will meet greater than or equal to 90% of their needs  MONITOR:   PO intake, Supplement acceptance, Weight trends, I & O's, Labs, Skin  REASON FOR ASSESSMENT:   Other (underweight BMI)    ASSESSMENT:   72 year old male who presented to the ED with chest pain. Pt was recently admitted and diagnosed with a STEMI of the LAD. Pt had a stent placed and was discharged home. PMH significant for hyperlipidemia, hypertension, ICM, tobacco use, COPD, CKD stage 2, and noncompliance.  Spoke with RN prior to visit.  Spoke with pt at bedside. Pt states that his main problem is mouth pain upon eating and that any "lumpy" or acidic food causes him pain. Noted full liquid meal tray at bedside at time of visit with approximately 25% meal completion. Pt reports that he was sent orange juice last night which caused him extreme pain. Pt with several lesions on tongue and cheeks that cause pain. RD obtained pt preferences and entered into HealthTouch software. Pt should no longer be brought beverages that cause burning sensations and pain.  Pt states that he has a "great appetite" and "ate like a horse" prior to STEMI and subsequent admission. Pt states the the pain with eating started directly after his last admission. Pt reports he used to eat 1 meal  daily that "started when I woke up and ended when I went to bed." RD asked if pt was referring to grazing on food throughout the day, and pt confirmed.  Pt endorses weight loss, stating that his UBW is 150-155 lbs and that he last weighed this "a couple months ago." Per weight history in chart, pt has lost 12 lbs since 10/06/17. This is a 10.2% weight loss in 3 months which is significant for timeframe.  Pt states that after his last discharge, he went home and did not eat for 2 days because "I was lying flat on my face on the floor." Pt reports that his son did not help him, "just threw my phone on my back." Unsure of pt's recent PO intake between admissions.  Medications reviewed and include: 100 mg Colace BID, 1 mg folic acid daily, 220 mg zinc sulfate daily  Labs reviewed.  NUTRITION - FOCUSED PHYSICAL EXAM:    Most Recent Value  Orbital Region  Severe depletion  Upper Arm Region  Severe depletion  Thoracic and Lumbar Region  Severe depletion  Buccal Region  Moderate depletion  Temple Region  Moderate depletion  Clavicle Bone Region  Severe depletion  Clavicle and Acromion Bone Region  Severe depletion  Scapular Bone Region  Unable to assess  Dorsal Hand  Severe depletion  Patellar Region  Severe depletion  Anterior Thigh Region  Severe depletion  Posterior Calf Region  Severe depletion  Edema (RD Assessment)  None  Hair  Reviewed  Eyes  Reviewed  Mouth  Reviewed [lesions to the tongue, cracking at corners of mouth, smooth glossy tongue, swollen and dark red gums]  Skin  Reviewed [scattered petechiae]  Nails  Reviewed       Diet Order:   Diet Order           Diet full liquid Room service appropriate? Yes; Fluid consistency: Thin  Diet effective now          EDUCATION NEEDS:   No education needs have been identified at this time  Skin:  Skin Assessment: Reviewed RN Assessment (scattered petechiae)  Last BM:  unknown/PTA  Height:   Ht Readings from Last 1  Encounters:  01/09/18 5\' 10"  (1.778 m)    Weight:   Wt Readings from Last 1 Encounters:  01/10/18 108 lb 7.5 oz (49.2 kg)    Ideal Body Weight:  74.45 kg  BMI:  Body mass index is 15.56 kg/m.  Estimated Nutritional Needs:   Kcal:  1700-1900 kcal/day  Protein:  85-100 grams/day  Fluid:  >/= 1.7 L/day    Earma Reading, MS, RD, LDN Pager: [phone] Weekend/After Hours: [phone]

## 2018-01-10 NOTE — Progress Notes (Signed)
Pt was angry and cursing because he had to wake up for his scheduled med. He refused Lovenox to be given to his belly and requested it to be given to his arm which the pharmacist said that it is okay for couple of days. While administering the med, pt moved his arm and the medication spilled. He said that I should not come to his room again because he felt pain at the site.

## 2018-01-10 NOTE — Progress Notes (Signed)
Progress Note  Patient Name: Ray Smith Date of Encounter: 01/10/2018  Primary Cardiologist: Lesleigh Noe, MD   Subjective   No chest pain or palpitations  Inpatient Medications    Scheduled Meds: . aspirin EC  81 mg Oral Daily  . atorvastatin  80 mg Oral q1800  . carvedilol  3.125 mg Oral BID WC  . clopidogrel  300 mg Oral Once  . clopidogrel  75 mg Oral Daily  . docusate sodium  100 mg Oral BID  . enoxaparin (LOVENOX) injection  40 mg Subcutaneous Q24H  . folic acid  1 mg Oral Daily  . magic mouthwash w/lidocaine  5 mL Oral QID  . sodium chloride flush  3 mL Intravenous Q12H  . spironolactone  12.5 mg Oral Daily  . zinc sulfate  220 mg Oral Daily   Continuous Infusions: . lactated ringers 75 mL/hr at 01/10/18 0851   PRN Meds: acetaminophen **OR** acetaminophen, ondansetron **OR** ondansetron (ZOFRAN) IV   Vital Signs    Vitals:   01/09/18 2105 01/09/18 2107 01/10/18 0448 01/10/18 0815  BP:  (!) 125/95 (!) 119/93 115/84  Pulse:  97 92 94  Resp:  18 18 18   Temp:  97.6 F (36.4 C) 97.8 F (36.6 C) 98 F (36.7 C)  TempSrc:  Oral Oral Oral  SpO2:   98% 100%  Weight: 108 lb 14.5 oz (49.4 kg)  108 lb 7.5 oz (49.2 kg)   Height: 5\' 10"  (1.778 m)       Intake/Output Summary (Last 24 hours) at 01/10/2018 1000 Last data filed at 01/10/2018 0857 Gross per 24 hour  Intake 1257.67 ml  Output 650 ml  Net 607.67 ml   Filed Weights   01/09/18 2105 01/10/18 0448  Weight: 108 lb 14.5 oz (49.4 kg) 108 lb 7.5 oz (49.2 kg)    Telemetry    NSR - Personally Reviewed  ECG    None today - Personally Reviewed  Physical Exam   GEN: No acute distress.   Neck: No JVD Cardiac: RRR, no murmurs, rubs, or gallops.  Respiratory: Clear to auscultation bilaterally. GI: Soft, nontender, non-distended  MS: No edema; No deformity. Neuro:  Nonfocal  Psych: Normal affect   Labs    Chemistry Recent Labs  Lab 01/09/18 1042 01/10/18 0443  NA 134* 137  K 3.6 3.8    CL 103 107  CO2 22 23  GLUCOSE 127* 98  BUN 19 17  CREATININE 1.31* 1.23  CALCIUM 9.0 8.5*  GFRNONAA 52* 56*  GFRAA >60 >60  ANIONGAP 9 7     Hematology Recent Labs  Lab 01/09/18 1042 01/10/18 0443  WBC 12.0* 8.4  RBC 4.35 4.19*  HGB 11.9* 11.3*  HCT 37.7* 35.9*  MCV 86.7 85.7  MCH 27.4 27.0  MCHC 31.6 31.5  RDW 14.6 14.7  PLT 361 300    Cardiac EnzymesNo results for input(s): TROPONINI in the last 168 hours.  Recent Labs  Lab 01/09/18 1048  TROPIPOC 2.10*     BNPNo results for input(s): BNP, PROBNP in the last 168 hours.   DDimer No results for input(s): DDIMER in the last 168 hours.   Radiology    Dg Chest 2 View  Result Date: 01/09/2018 CLINICAL DATA:  Chest pain. EXAM: CHEST - 2 VIEW COMPARISON:  Chest x-ray dated December 30, 2017. FINDINGS: The heart size and mediastinal contours are within normal limits. Normal pulmonary vascularity. Atherosclerotic calcification of the aortic arch. The lungs remain hyperinflated with  increased interstitial markings. No focal consolidation, pleural effusion, or pneumothorax. No acute osseous abnormality. IMPRESSION: 1. COPD and chronic lung disease. No active cardiopulmonary disease. 2.  Aortic atherosclerosis (ICD10-I70.0). Electronically Signed   By: Obie Dredge M.D.   On: 01/09/2018 11:11    Cardiac Studies   Cath result reviewed  Patient Profile     72 y.o. male with recent anterior MI  Assessment & Plan    1) CAD/anterior MI: No cardiac issues.  Continue DAPT.  Hopefully, he will accept some help with his social situation and he can be more compliant with medicines at home. [facility] will sign off.   Medication Recommendations:  COntinue aspirin and PLavix Other recommendations (labs, testing, etc):   Follow up as an outpatient:  Please call Trish to schedule when he is being discharged  For questions or updates, please contact [facility] Please consult [email or web address] for contact info under  Cardiology/STEMI.      Signed, Lance Muss, MD  01/10/2018, 10:00 AM

## 2018-01-10 NOTE — Care Management Note (Deleted)
Case Management Note  Patient Details  Name: Ray Smith MRN: [id number] Date of Birth: [birth year]  Subjective/Objective:   Weakness                 Action/Plan: 01/10/2018 - CM following for progression of care; noted admitted 3 x in 6 months; Physical Therapy to eval for disposition needs; B Shelba Flake  01/02/2018 -  NCM spoke to pt and son, [name] at bedside. Pt does not have drug coverage. Explained to son the importance of signing up with Medicare D. Pt states he cannot afford. States he only gets $500 a month. Provided son with information to follow up with DSS and SSA to check on Medicaid and SS benefits. Also to see if he would qualify for food stamps. Provided pt with MATCH with once per year use and $3 copay. Has cane at home. Dixon Boos RN CM  Expected Discharge Date:  01/02/18               Expected Discharge Plan:  Home/Self Care  Status of Service:  Completed, signed off  Reola Mosher [phone]  01/10/2018, 3:01 PM

## 2018-01-10 NOTE — Care Management Note (Signed)
Case Management Note  Patient Details  Name: Ray Smith MRN: [id number] Date of Birth: 1946-04-20  Subjective/Objective:    Weakness             Action/Plan: 01/10/2018 - CM following for progression of care; noted admitted 3 x in 6 months; Physical Therapy to eval for disposition needs; Patient also refused all HHC services last admission; B Shelba Flake  01/02/2018 -  NCM spoke to pt and son, [name] at bedside. Pt does not have drug coverage. Explained to son the importance of signing up with Medicare D. Pt states he cannot afford. States he only gets $500 a month. Provided son with information to follow up with DSS and SSA to check on Medicaid and SS benefits. Also to see if he would qualify for food stamps. Provided pt with MATCH with once per year use and $3 copay.Has cane at home.Dixon Boos RN CM  Expected Discharge Date:  01/11/18               Expected Discharge Plan:   possibly home with [facility] services  Status of Service:   In progress  Reola Mosher [phone] 01/10/2018, 3:07 PM

## 2018-01-10 NOTE — Progress Notes (Signed)
Progress Note    Ray Smith  LKG:[id number] DOB: December 20, 1945  DOA: 01/09/2018 PCP: Patient, No Pcp Per    Brief Narrative:     Medical records reviewed and are as summarized below:  Ray Smith is an 72 y.o. male with medical history significant of coronary artery disease including 7/6 STEMI of the LAD s/p PCI; chronic pain with narcotic use; hyperlipidemia; and  hypertension presenting with reported chest pain according to the EDP - but the patient reports minimal to no chest pain.  He states he has not been able to eat since he was discharged.      Assessment/Plan:   Principal Problem:   Weakness Active Problems:   Coronary Artery Disease   COPD (chronic obstructive pulmonary disease) (HCC)   Dyslipidemia   Hypertension   Mouth pain   CKD (chronic kidney disease) stage 2, GFR 60-89 ml/min   Marijuana abuse  Weakness -Patient recently hospitalized for STEMI returning with significant generalized weakness resulting in falls -Suspect malnutrition due to inability to eat from mouth pain -PT eval -treatment of mouth pain -Consult SW for poor housing situation  Mouth pain -The underlying source of his complaints appears to be related to weakness associated with inability to take PO -tongues is shiny/smooth -slightly improved with magic mouthwash with lidocaine -Full liquid diet -check B12, replace folate/zinc PO -monitor for improvement-- no sign of esophageal involvement otherwise may need EGD/GI consult  CAD -There is currently little evidence to suggest recurrent ischemic heart disease - his troponin is improved and denies chest pain -Cardiology has consulted and suspects noncompliance with Plavix and so he has been reloaded -- they have no further recommendations -Continue DAPT   HTN -Continue Coreg and spironolactone as tolerated  HLD -Continue high-dose Lipitor  CKD stage III -Appears to be stable -Will follow  COPD -He does not appear to  be using even nebs for this issue -wean off 2L  Marijuana abuse -encouraged cessation    Family Communication/Anticipated D/C date and plan/Code Status   DVT prophylaxis: Lovenox ordered. Code Status: DNR  Family Communication:  Disposition Plan: pending PT eval and improvement-- social situation not stellar   Medical Consultants:    Cards-- signed off    Subjective:   Mouth feel some better Acidic/salty foods burn Lactose intolerant  Objective:    Vitals:   01/09/18 2105 01/09/18 2107 01/10/18 0448 01/10/18 0815  BP:  (!) 125/95 (!) 119/93 115/84  Pulse:  97 92 94  Resp:  18 18 18   Temp:  97.6 F (36.4 C) 97.8 F (36.6 C) 98 F (36.7 C)  TempSrc:  Oral Oral Oral  SpO2:   98% 100%  Weight: 49.4 kg (108 lb 14.5 oz)  49.2 kg (108 lb 7.5 oz)   Height: 5\' 10"  (1.778 m)       Intake/Output Summary (Last 24 hours) at 01/10/2018 1140 Last data filed at 01/10/2018 0857 Gross per 24 hour  Intake 1257.67 ml  Output 650 ml  Net 607.67 ml   Filed Weights   01/09/18 2105 01/10/18 0448  Weight: 49.4 kg (108 lb 14.5 oz) 49.2 kg (108 lb 7.5 oz)    Exam: In bed, chronically ill appearing, frail rrr No wheezing, no increased work of breathing, on 2L Tongue with fissure down middle, shiny and smooth-- no white plaques A+Ox3  Data Reviewed:   I have personally reviewed following labs and imaging studies:  Labs: Labs show the following:   Basic Metabolic Panel:  Recent Labs  Lab 01/09/18 1042 01/10/18 0443  NA 134* 137  K 3.6 3.8  CL 103 107  CO2 22 23  GLUCOSE 127* 98  BUN 19 17  CREATININE 1.31* 1.23  CALCIUM 9.0 8.5*   GFR Estimated Creatinine Clearance: 37.2 mL/min (by C-G formula based on SCr of 1.23 mg/dL). Liver Function Tests: No results for input(s): AST, ALT, ALKPHOS, BILITOT, PROT, ALBUMIN in the last 168 hours. No results for input(s): LIPASE, AMYLASE in the last 168 hours. No results for input(s): AMMONIA in the last 168  hours. Coagulation profile No results for input(s): INR, PROTIME in the last 168 hours.  CBC: Recent Labs  Lab 01/09/18 1042 01/10/18 0443  WBC 12.0* 8.4  HGB 11.9* 11.3*  HCT 37.7* 35.9*  MCV 86.7 85.7  PLT 361 300   Cardiac Enzymes: No results for input(s): CKTOTAL, CKMB, CKMBINDEX, TROPONINI in the last 168 hours. BNP (last 3 results) No results for input(s): PROBNP in the last 8760 hours. CBG: No results for input(s): GLUCAP in the last 168 hours. D-Dimer: No results for input(s): DDIMER in the last 72 hours. Hgb A1c: No results for input(s): HGBA1C in the last 72 hours. Lipid Profile: No results for input(s): CHOL, HDL, LDLCALC, TRIG, CHOLHDL, LDLDIRECT in the last 72 hours. Thyroid function studies: No results for input(s): TSH, T4TOTAL, T3FREE, THYROIDAB in the last 72 hours.  Invalid input(s): FREET3 Anemia work up: No results for input(s): VITAMINB12, FOLATE, FERRITIN, TIBC, IRON, RETICCTPCT in the last 72 hours. Sepsis Labs: Recent Labs  Lab 01/09/18 1042 01/10/18 0443  WBC 12.0* 8.4    Microbiology No results found for this or any previous visit (from the past 240 hour(s)).  Procedures and diagnostic studies:  Dg Chest 2 View  Result Date: 01/09/2018 CLINICAL DATA:  Chest pain. EXAM: CHEST - 2 VIEW COMPARISON:  Chest x-ray dated December 30, 2017. FINDINGS: The heart size and mediastinal contours are within normal limits. Normal pulmonary vascularity. Atherosclerotic calcification of the aortic arch. The lungs remain hyperinflated with increased interstitial markings. No focal consolidation, pleural effusion, or pneumothorax. No acute osseous abnormality. IMPRESSION: 1. COPD and chronic lung disease. No active cardiopulmonary disease. 2.  Aortic atherosclerosis (ICD10-I70.0). Electronically Signed   By: Obie Dredge M.D.   On: 01/09/2018 11:11    Medications:   . aspirin EC  81 mg Oral Daily  . atorvastatin  80 mg Oral q1800  . carvedilol  3.125 mg Oral  BID WC  . clopidogrel  300 mg Oral Once  . clopidogrel  75 mg Oral Daily  . docusate sodium  100 mg Oral BID  . enoxaparin (LOVENOX) injection  40 mg Subcutaneous Q24H  . folic acid  1 mg Oral Daily  . magic mouthwash w/lidocaine  5 mL Oral QID  . sodium chloride flush  3 mL Intravenous Q12H  . spironolactone  12.5 mg Oral Daily  . zinc sulfate  220 mg Oral Daily   Continuous Infusions: . lactated ringers 75 mL/hr at 01/10/18 0851     LOS: 0 days   Joseph Art  [facility]   *Please refer to [email or web address], password [email or web address] to get updated schedule on who will round on this patient, as hospitalists switch teams weekly. If 7PM-7AM, please contact night-coverage at [email or web address], password [email or web address] for any overnight needs.  01/10/2018, 11:40 AM

## 2018-01-11 ENCOUNTER — Other Ambulatory Visit: Payer: Self-pay

## 2018-01-11 LAB — CBC
HEMATOCRIT: 35.4 % — AB (ref 39.0–52.0)
HEMOGLOBIN: 11.3 g/dL — AB (ref 13.0–17.0)
MCH: 27.5 pg (ref 26.0–34.0)
MCHC: 31.9 g/dL (ref 30.0–36.0)
MCV: 86.1 fL (ref 78.0–100.0)
Platelets: 270 10*3/uL (ref 150–400)
RBC: 4.11 MIL/uL — AB (ref 4.22–5.81)
RDW: 14.7 % (ref 11.5–15.5)
WBC: 8.5 10*3/uL (ref 4.0–10.5)

## 2018-01-11 LAB — BASIC METABOLIC PANEL
Anion gap: 9 (ref 5–15)
BUN: 17 mg/dL (ref 8–23)
CO2: 23 mmol/L (ref 22–32)
CREATININE: 1.23 mg/dL (ref 0.61–1.24)
Calcium: 8.7 mg/dL — ABNORMAL LOW (ref 8.9–10.3)
Chloride: 105 mmol/L (ref 98–111)
GFR calc Af Amer: >60 mL/min (ref >60)
GFR calc non Af Amer: 56 mL/min — ABNORMAL LOW (ref >60)
GLUCOSE: 86 mg/dL (ref 70–99)
POTASSIUM: 3.5 mmol/L (ref 3.5–5.1)
SODIUM: 137 mmol/L (ref 135–145)

## 2018-01-11 LAB — MAGNESIUM: MAGNESIUM: 1.9 mg/dL (ref 1.7–2.4)

## 2018-01-11 LAB — FOLATE: Folate: 15.7 ng/mL (ref >5.9)

## 2018-01-11 MED ORDER — ENOXAPARIN SODIUM 40 MG/0.4ML ~~LOC~~ SOLN
40.0000 mg | SUBCUTANEOUS | Status: DC
  Administered 2018-01-12 – 2018-01-16 (×5): 40 mg via SUBCUTANEOUS
  Filled 2018-01-11 (×7): qty 0.4

## 2018-01-11 MED ORDER — VITAMIN C 500 MG PO TABS
500.0000 mg | ORAL_TABLET | Freq: Every day | ORAL | Status: DC
  Administered 2018-01-12 – 2018-01-19 (×8): 500 mg via ORAL
  Filled 2018-01-11 (×8): qty 1

## 2018-01-11 NOTE — Progress Notes (Signed)
This RN received report from off going nurse. Patient is agitated does not want staff in the room at this time. Refused night time meds. Family present at bedside now. RN will continue to round on patient.

## 2018-01-11 NOTE — Clinical Social Work Note (Signed)
Clinical Social Work Assessment  Patient Details  Name: Ray Smith MRN: [id number] Date of Birth: [birth year]  Date of referral:  01/11/18               Reason for consult:  Abuse/Neglect                Permission sought to share information with:    Permission granted to share information::  No  Name::        Agency::     Relationship::     Contact Information:     Housing/Transportation Living arrangements for the past 2 months:  Single Family Home Source of Information:  Patient, Medical Team Patient Interpreter Needed:  None Criminal Activity/Legal Involvement Pertinent to Current Situation/Hospitalization:  No - Comment as needed Significant Relationships:  Adult Children Lives with:  Self Do you feel safe going back to the place where you live?  Yes Need for family participation in patient care:  Yes (Comment)  Care giving concerns:  Social issues.   Social Worker assessment / plan:  CSW met with patient. No supports at bedside. CSW introduced role and inquired about home issues. Patient stated he has a home and lives there alone. Discussed concerns regarding his son. He stated his son using "crack." Patient stated he called law enforcement to store his trucks while he is in the hospital so his son does not have access to them. Patient's daughter is coming from [place] to care for him this weekend. Patient attributes his weakness to his inability to eat at home. He voiced frustrations with continually getting acidic foods while in the hospital and is unable to eat them. Patient stated once he is able to eat again, he will easily be able to get his strength back. Patient states he is independent at home. No further concerns. No need for APS report at this time. CSW signing off as social work intervention is no longer needed.  Employment status:  Retired Forensic scientist:  Medicare PT Recommendations:  Not assessed at this time [place] / Referral to community  resources:  Other (Comment Required)(No needs at this time.)  Patient/Family's Response to care:  Patient agreeable to speaking with CSW. Patient's daughter supportive and involved in patient's care. Patient appreciated social work intervention.  Patient/Family's Understanding of and Emotional Response to Diagnosis, Current Treatment, and Prognosis:  Patient has a good understanding of the reason for admission and social work consult. Patient unhappy with food he has been receiving here at the hospital. RN aware.  Emotional Assessment Appearance:  Appears stated age Attitude/Demeanor/Rapport:  Engaged, Gracious Affect (typically observed):  Accepting, Appropriate, Calm, Pleasant Orientation:  Oriented to Self, Oriented to Place, Oriented to  Time, Oriented to Situation Alcohol / Substance use:  Illicit Drugs Psych involvement (Current and /or in the community):  No (Comment)  Discharge Needs  Concerns to be addressed:  Care Coordination Readmission within the last 30 days:  Yes Current discharge risk:  None Barriers to Discharge:  Continued Medical Work up   Candie Chroman, LCSW 01/11/2018, 2:27 PM

## 2018-01-11 NOTE — Progress Notes (Signed)
PROGRESS NOTE    Ray Smith  NID:[id number] DOB: 18-Nov-1945 DOA: 01/09/2018 PCP: Patient, No Pcp Per   Brief Narrative: Ray Smith is an 72 y.o. male with medical history significant ofcoronary artery diseaseincluding 7/6 STEMI of the LAD s/p PCI;chronic pain with narcotic use;hyperlipidemia; andhypertension presenting withreportedchest painaccording to the EDP - but the patient reports minimal to no chest pain.  He states he has not been able to eat since he was discharged.    Assessment & Plan:   Principal Problem:   Weakness Active Problems:   Coronary Artery Disease   COPD (chronic obstructive pulmonary disease) (HCC)   Dyslipidemia   Hypertension   Orthostatic hypotension   Mouth pain   CKD (chronic kidney disease) stage 2, GFR 60-89 ml/min   Marijuana abuse   Protein-calorie malnutrition, severe   Weakness -Patient recently hospitalized for STEMI returning with significant generalized weakness resulting in falls -Suspect malnutrition due to inability to eat from mouth pain -PT eval -> recommending SNF -treatment of mouth pain -Consult SW for poor housing situation  Mouth pain -The underlying source of his complaints appears to be related to weakness associated with inability to take PO - tongues is shiny/smooth with linear fissures - Advance to soft diet in attempt to give him more options - check B12 (within normal limits).  Follow folate, zinc, and vitamin C levels. - replace folate/zinc PO - Will add vitamin C supplementation as well - Getting nystatin, but may need to transition to something systemic if concern for esophagitis as well -> follow EKG for repeat QT (initial prolonged), consider fluconazole - GI c/s with concern for esophageal involvement with description of pain with swallowing as well.  Discussed with Dr. Dulce Sellar, recommendation for barium swallow first.  Difficult situation as pt needs DAPT and may need bx for dx.  CAD -There is  currently little evidence to suggest recurrent ischemic heart disease - his troponin is improved and denies chest pain -Cardiology has consulted and suspects noncompliance with Plavix and so he has been reloaded -- they have no further recommendations -Continue DAPT - Will need to message card master at d/c for f/u appt  HTN -Continue Coreg and spironolactone as tolerated  HLD -Continue high-dose Lipitor  CKD stage III -Appears to be stable -Will follow  COPD -He does not appear to be using even nebs for this issue -wean as tolerated from O2 (recently on RA and no SOB)  Marijuana abuse -encouraged cessation  DVT prophylaxis: lovenox Code Status: DNR Family Communication: none at bedside Disposition Plan: pending   Consultants:   Cardiology  GI  Procedures:   none  Antimicrobials:  Anti-infectives (From admission, onward)   None      Subjective: Extremely upset about food. Says they didn't get his order right. Pain in mouth and down throat with swallowing.  Objective: Vitals:   01/11/18 0840 01/11/18 1119 01/11/18 1127 01/11/18 1600  BP: 96/78 90/68  99/72  Pulse: (!) 103 92  82  Resp: 16 19  17   Temp: (!) 97.5 F (36.4 C) 97.6 F (36.4 C)  98.1 F (36.7 C)  TempSrc: Oral Oral  Oral  SpO2: 100% (!) 85% 97% 92%  Weight:      Height:        Intake/Output Summary (Last 24 hours) at 01/11/2018 1626 Last data filed at 01/11/2018 1600 Gross per 24 hour  Intake 1893.52 ml  Output 1050 ml  Net 843.52 ml   [employer]  01/09/18 2105 01/10/18 0448 01/11/18 0505  Weight: 49.4 kg (108 lb 14.5 oz) 49.2 kg (108 lb 7.5 oz) 48.4 kg (106 lb 11.2 oz)    Examination:  General exam: Appears calm and comfortable  HEENT: tongue with fissures down middle Respiratory system: Clear to auscultation. Respiratory effort normal. Cardiovascular system: S1 & S2 heard, RRR.  Gastrointestinal system: Abdomen is nondistended, soft and nontender. Central nervous  system: Alert and oriented. No focal neurological deficits. Extremities: Symmetric 5 x 5 power. Skin: No rashes, lesions or ulcers Psychiatry: Judgement and insight appear normal. Mood & affect appropriate.     Data Reviewed: I have personally reviewed following labs and imaging studies  CBC: Recent Labs  Lab 01/09/18 1042 01/10/18 0443 01/11/18 0551  WBC 12.0* 8.4 8.5  HGB 11.9* 11.3* 11.3*  HCT 37.7* 35.9* 35.4*  MCV 86.7 85.7 86.1  PLT 361 300 270   Basic Metabolic Panel: Recent Labs  Lab 01/09/18 1042 01/10/18 0443 01/11/18 0551  NA 134* 137 137  K 3.6 3.8 3.5  CL 103 107 105  CO2 22 23 23   GLUCOSE 127* 98 86  BUN 19 17 17   CREATININE 1.31* 1.23 1.23  CALCIUM 9.0 8.5* 8.7*  MG  --   --  1.9   GFR: Estimated Creatinine Clearance: 36.6 mL/min (by C-G formula based on SCr of 1.23 mg/dL). Liver Function Tests: No results for input(s): AST, ALT, ALKPHOS, BILITOT, PROT, ALBUMIN in the last 168 hours. No results for input(s): LIPASE, AMYLASE in the last 168 hours. No results for input(s): AMMONIA in the last 168 hours. Coagulation Profile: No results for input(s): INR, PROTIME in the last 168 hours. Cardiac Enzymes: No results for input(s): CKTOTAL, CKMB, CKMBINDEX, TROPONINI in the last 168 hours. BNP (last 3 results) No results for input(s): PROBNP in the last 8760 hours. HbA1C: No results for input(s): HGBA1C in the last 72 hours. CBG: No results for input(s): GLUCAP in the last 168 hours. Lipid Profile: No results for input(s): CHOL, HDL, LDLCALC, TRIG, CHOLHDL, LDLDIRECT in the last 72 hours. Thyroid Function Tests: No results for input(s): TSH, T4TOTAL, FREET4, T3FREE, THYROIDAB in the last 72 hours. Anemia Panel: Recent Labs    01/10/18 1057  VITAMINB12 450   Sepsis Labs: No results for input(s): PROCALCITON, LATICACIDVEN in the last 168 hours.  No results found for this or any previous visit (from the past 240 hour(s)).       Radiology  Studies: No results found.      Scheduled Meds: . aspirin EC  81 mg Oral Daily  . atorvastatin  80 mg Oral q1800  . carvedilol  3.125 mg Oral BID WC  . clopidogrel  300 mg Oral Once  . clopidogrel  75 mg Oral Daily  . docusate sodium  100 mg Oral BID  . folic acid  1 mg Oral Daily  . magic mouthwash w/lidocaine  5 mL Oral QID  . nystatin  5 mL Oral QID  . sodium chloride flush  3 mL Intravenous Q12H  . spironolactone  12.5 mg Oral Daily  . zinc sulfate  220 mg Oral Daily   Continuous Infusions:   LOS: 1 day    Time spent: over 30 min    Lacretia Nicks, MD [facility] Pager [phone]  If 7PM-7AM, please contact night-coverage [email or web address] Password [facility] 01/11/2018, 4:26 PM
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
## 2018-01-11 NOTE — Evaluation (Signed)
Physical Therapy Evaluation Patient Details Name: Ray Smith MRN: [id number] DOB: 1945-11-28 Today's Date: 01/11/2018   History of Present Illness  Pt is a 72 y.o. M with significant for hyperlipidemia, hypertension, ICM, tobacco use, COPD, CKD stage 2, and 7/6 STEMI of the LAD s/p PCI; EEG revealed RBBB, nonspecific ST changes with no evidence of acute ischemia  Clinical Impression  PTA pt reports that he had fallen and his son would not help him back up. Pt reports that he has not been able to move around because he has lost his strength due to decreased eating from sore mouth. Pt repeatedly states that staff brings him food that causes increased soreness in his mouth. Pt only willing to gross strength assessment and exercise in bed. Given conversation about falling and decreased help at home, PT recommends SNF level care although pt states he will refuse it. PT will continue to follow acutely.     Follow Up Recommendations SNF    Equipment Recommendations  None recommended by PT       Precautions / Restrictions Precautions Precautions: Fall Precaution Comments: admitted s/p fall Restrictions Weight Bearing Restrictions: No      Mobility  Bed Mobility               General bed mobility comments: refuses any mobility despite max encouragement                 Pertinent Vitals/Pain Pain Assessment: 0-10 Pain Score: 7  Pain Location: mouth Pain Descriptors / Indicators: Sharp;Burning;Aching;Sore Pain Intervention(s): Limited activity within patient's tolerance;Monitored during session    Home Living Family/patient expects to be discharged to:: Private residence Living Arrangements: Children Available Help at Discharge: Family;Available PRN/intermittently(daughter should be here from [place] by 7/21) Type of Home: House Home Access: Stairs to enter Entrance Stairs-Rails: None Entrance Stairs-Number of Steps: 2 Home Layout: One level Home Equipment:  Crutches;Walker - 2 wheels;Shower seat      Prior Function Level of Independence: Independent         Comments: pt questionable historian, reports he was getting around fine before he stopped eating and lost all of his strength         Extremity/Trunk Assessment   Upper Extremity Assessment Upper Extremity Assessment: RUE deficits/detail;LUE deficits/detail RUE Deficits / Details: AROM 90 degree shoulder flexion, elbow- WFL, wrist-WF, decreased grip, strength grossly assessed in supine 4/5   RUE Sensation: decreased light touch(decreased sensation in finger tips ) LUE Deficits / Details: AROM 90 degree shoulder flexion, elbow- WFL, wrist-WF, decreased grip, strength grossly assessed in supine 4/5   LUE Sensation: decreased light touch(decreased light touch in fingers)    Lower Extremity Assessment Lower Extremity Assessment: RLE deficits/detail;LLE deficits/detail RLE Deficits / Details: hip and knee ROM WFL, refused to remove slippers to assess ankle, states he has has R foot surgery  and feet, hip and knee strength grossly 4/5  LLE Deficits / Details: hip and knee ROM WFL, refused to remove slippers to assess ankle, and feet, hip and knee strength grossly 4/5        Communication   Communication: No difficulties  Cognition Arousal/Alertness: Awake/alert Behavior During Therapy: WFL for tasks assessed/performed Overall Cognitive Status: No family/caregiver present to determine baseline cognitive functioning                                        General Comments General  comments (skin integrity, edema, etc.): Pt reports he will be fine if he could just get some food to eat so he can build his strength back    Exercises General Exercises - Upper Extremity Shoulder Flexion: AROM;Both;5 reps;Supine Elbow Flexion: AROM;Both;5 reps;Supine Elbow Extension: AROM;Both;5 reps;Supine Digit Composite Flexion: AROM;Both;5 reps;Supine General Exercises - Lower  Extremity Heel Slides: AROM;Both;5 reps;Supine Straight Leg Raises: AROM;Both;5 reps;Supine Hip Flexion/Marching: AROM;Both;5 reps;Supine   Assessment/Plan    PT Assessment Patient needs continued PT services  PT Problem List Decreased strength;Decreased activity tolerance       PT Treatment Interventions DME instruction;Gait training;Stair training;Functional mobility training;Therapeutic activities;Therapeutic exercise;Balance training;Cognitive remediation;Patient/family education    PT Goals (Current goals can be found in the Care Plan section)  Acute Rehab PT Goals Patient Stated Goal: go home PT Goal Formulation: With patient Time For Goal Achievement: 01/25/18 Potential to Achieve Goals: Fair    Frequency Min 3X/week   Barriers to discharge Decreased caregiver support         AM-PAC PT "6 Clicks" Daily Activity  Outcome Measure Difficulty turning over in bed (including adjusting bedclothes, sheets and blankets)?: Unable Difficulty moving from lying on back to sitting on the side of the bed? : Unable Difficulty sitting down on and standing up from a chair with arms (e.g., wheelchair, bedside commode, etc,.)?: Unable Help needed moving to and from a bed to chair (including a wheelchair)?: Total Help needed walking in hospital room?: Total Help needed climbing 3-5 steps with a railing? : Total 6 Click Score: 6    End of Session   Activity Tolerance: Other (comment)(pt refused mobility) Patient left: in bed;with call bell/phone within reach;with bed alarm set;with SCD's reapplied Nurse Communication: Mobility status PT Visit Diagnosis: Muscle weakness (generalized) (M62.81);History of falling (Z91.81);Difficulty in walking, not elsewhere classified (R26.2)    Time: 9562-1308 PT Time Calculation (min) (ACUTE ONLY): 24 min   Charges:   PT Evaluation $PT Eval Moderate Complexity: 1 Mod PT Treatments $Therapeutic Exercise: 8-22 mins   PT G Codes:         Hajime Asfaw B. Beverely Risen PT, DPT Acute Rehabilitation  [phone] Pager [phone]    Elon Alas Fleet 01/11/2018, 4:12 PM

## 2018-01-12 ENCOUNTER — Inpatient Hospital Stay (HOSPITAL_COMMUNITY): Payer: Medicare Other

## 2018-01-12 LAB — CBC
HEMATOCRIT: 35 % — AB (ref 39.0–52.0)
HEMOGLOBIN: 11 g/dL — AB (ref 13.0–17.0)
MCH: 26.9 pg (ref 26.0–34.0)
MCHC: 31.4 g/dL (ref 30.0–36.0)
MCV: 85.6 fL (ref 78.0–100.0)
Platelets: 255 10*3/uL (ref 150–400)
RBC: 4.09 MIL/uL — AB (ref 4.22–5.81)
RDW: 14.6 % (ref 11.5–15.5)
WBC: 9.5 10*3/uL (ref 4.0–10.5)

## 2018-01-12 LAB — BASIC METABOLIC PANEL
ANION GAP: 5 (ref 5–15)
BUN: 22 mg/dL (ref 8–23)
CALCIUM: 8.8 mg/dL — AB (ref 8.9–10.3)
CO2: 25 mmol/L (ref 22–32)
Chloride: 108 mmol/L (ref 98–111)
Creatinine, Ser: 1.3 mg/dL — ABNORMAL HIGH (ref 0.61–1.24)
GFR calc non Af Amer: 53 mL/min — ABNORMAL LOW (ref >60)
Glucose, Bld: 96 mg/dL (ref 70–99)
POTASSIUM: 3.3 mmol/L — AB (ref 3.5–5.1)
Sodium: 138 mmol/L (ref 135–145)

## 2018-01-12 LAB — ZINC: ZINC: 29 ug/dL — AB (ref 56–134)

## 2018-01-12 MED ORDER — SUCRALFATE 1 GM/10ML PO SUSP
1.0000 g | Freq: Three times a day (TID) | ORAL | Status: DC
  Administered 2018-01-12 – 2018-01-14 (×9): 1 g via ORAL
  Filled 2018-01-12 (×8): qty 10

## 2018-01-12 MED ORDER — PANTOPRAZOLE SODIUM 40 MG PO TBEC
40.0000 mg | DELAYED_RELEASE_TABLET | Freq: Every day | ORAL | Status: DC
  Administered 2018-01-12 – 2018-01-19 (×8): 40 mg via ORAL
  Filled 2018-01-12 (×8): qty 1

## 2018-01-12 MED ORDER — POTASSIUM CHLORIDE CRYS ER 20 MEQ PO TBCR
40.0000 meq | EXTENDED_RELEASE_TABLET | Freq: Once | ORAL | Status: AC
  Administered 2018-01-12: 40 meq via ORAL
  Filled 2018-01-12: qty 2

## 2018-01-12 MED ORDER — POLYETHYLENE GLYCOL 3350 17 G PO PACK
17.0000 g | PACK | Freq: Two times a day (BID) | ORAL | Status: DC
  Administered 2018-01-12 – 2018-01-16 (×8): 17 g via ORAL
  Filled 2018-01-12 (×12): qty 1

## 2018-01-12 NOTE — Progress Notes (Signed)
PROGRESS NOTE    Ray Smith  WUJ:[id number] DOB: 09-Feb-1946 DOA: 01/09/2018 PCP: Patient, No Pcp Per   Brief Narrative: Ray Smith is an 72 y.o. male with medical history significant ofcoronary artery diseaseincluding 7/6 STEMI of the LAD s/p PCI;chronic pain with narcotic use;hyperlipidemia; andhypertension presenting withreportedchest painaccording to the EDP - but the patient reports minimal to no chest pain.  He states he has not been able to eat since he was discharged.    Assessment & Plan:   Principal Problem:   Weakness Active Problems:   Coronary Artery Disease   COPD (chronic obstructive pulmonary disease) (HCC)   Dyslipidemia   Hypertension   Orthostatic hypotension   Mouth pain   CKD (chronic kidney disease) stage 2, GFR 60-89 ml/min   Marijuana abuse   Protein-calorie malnutrition, severe   Weakness -Patient recently hospitalized for STEMI returning with significant generalized weakness resulting in falls -Suspect malnutrition due to inability to eat from mouth pain -PT eval -> recommending SNF -treatment of mouth pain -Consult SW for poor housing situation  Mouth pain -The underlying source of his complaints appears to be related to weakness associated with inability to take PO - tongues is shiny/smooth with linear fissures - He also has oral ulcers - Advance to soft diet in attempt to give him more options - check B12 (within normal limits).  Follow folate (wnl), zinc, and vitamin C levels. - Will check for HSV - replace folate/zinc PO - Will add vitamin C supplementation as well - Carafate, magic mouthwash. Add PPI. - Getting nystatin, but may need to transition to something systemic if concern for esophagitis as well -> follow EKG for repeat QT (prolonged), consider fluconazole - GI c/s with concern for esophageal involvement with description of pain with swallowing as well.  Discussed with Dr. Dulce Sellar, recommendation for barium swallow  first.  Difficult situation as pt needs DAPT and may need bx for dx.  CAD -There is currently little evidence to suggest recurrent ischemic heart disease - his troponin is improved and denies chest pain -Cardiology has consulted and suspects noncompliance with Plavix and so he has been reloaded -- they have no further recommendations -Continue DAPT - Will need to message card master at d/c for f/u appt  HTN -Continue Coreg and spironolactone as tolerated  HLD -Continue high-dose Lipitor  CKD stage III -Appears to be stable -Will follow  COPD -He does not appear to be using even nebs for this issue -intermittently on O2, wean as tolerated  Hypokalemia: replete, follow  Marijuana abuse -encouraged cessation  DVT prophylaxis: lovenox Code Status: DNR Family Communication: none at bedside Disposition Plan: pending   Consultants:   Cardiology  GI  Procedures:   none  Antimicrobials:  Anti-infectives (From admission, onward)   None      Subjective: Frustrated.   Objective: Vitals:   01/12/18 0444 01/12/18 0900 01/12/18 1237 01/12/18 1726  BP: 105/64 100/60 91/73 98/68   Pulse: 85 82 66   Resp: 16  18   Temp: 97.8 F (36.6 C)  98.7 F (37.1 C)   TempSrc: Oral  Oral   SpO2: 95%  90%   Weight: 49 kg (108 lb 0.4 oz)     Height:        Intake/Output Summary (Last 24 hours) at 01/12/2018 1744 Last data filed at 01/12/2018 1300 Gross per 24 hour  Intake 480 ml  Output 201 ml  Net 279 ml   Filed Weights   01/10/18 0448  01/11/18 0505 01/12/18 0444  Weight: 49.2 kg (108 lb 7.5 oz) 48.4 kg (106 lb 11.2 oz) 49 kg (108 lb 0.4 oz)    Examination:  General: No acute distress. HEENT: painful oral ulcers, linear fissues on tongue Cardiovascular: Heart sounds show D[place] St[place] regular rate, and rhythm Lungs: Clear to auscultation bilaterally  Abdomen: Soft, nontender, nondistended  Neurological: Alert and oriented 3. Moves all extremities 4. Cranial nerves II  through XII grossly intact. Skin: Warm and dry. No rashes or lesions. Extremities: No clubbing or cyanosis. No edema. Pedal pulses 2+. Psychiatric: Mood and affect are normal. Insight and judgment are appropriate.   Data Reviewed: I have personally reviewed following labs and imaging studies  CBC: Recent Labs  Lab 01/09/18 1042 01/10/18 0443 01/11/18 0551 01/12/18 0609  WBC 12.0* 8.4 8.5 9.5  HGB 11.9* 11.3* 11.3* 11.0*  HCT 37.7* 35.9* 35.4* 35.0*  MCV 86.7 85.7 86.1 85.6  PLT 361 300 270 255   Basic Metabolic Panel: Recent Labs  Lab 01/09/18 1042 01/10/18 0443 01/11/18 0551 01/12/18 0609  NA 134* 137 137 138  K 3.6 3.8 3.5 3.3*  CL 103 107 105 108  CO2 22 23 23 25   GLUCOSE 127* 98 86 96  BUN 19 17 17 22   CREATININE 1.31* 1.23 1.23 1.30*  CALCIUM 9.0 8.5* 8.7* 8.8*  MG  --   --  1.9  --    GFR: Estimated Creatinine Clearance: 35.1 mL/min (Yuri Fana) (by C-G formula based on SCr of 1.3 mg/dL (H)). Liver Function Tests: No results for input(s): AST, ALT, ALKPHOS, BILITOT, PROT, ALBUMIN in the last 168 hours. No results for input(s): LIPASE, AMYLASE in the last 168 hours. No results for input(s): AMMONIA in the last 168 hours. Coagulation Profile: No results for input(s): INR, PROTIME in the last 168 hours. Cardiac Enzymes: No results for input(s): CKTOTAL, CKMB, CKMBINDEX, TROPONINI in the last 168 hours. BNP (last 3 results) No results for input(s): PROBNP in the last 8760 hours. HbA1C: No results for input(s): HGBA1C in the last 72 hours. CBG: No results for input(s): GLUCAP in the last 168 hours. Lipid Profile: No results for input(s): CHOL, HDL, LDLCALC, TRIG, CHOLHDL, LDLDIRECT in the last 72 hours. Thyroid Function Tests: No results for input(s): TSH, T4TOTAL, FREET4, T3FREE, THYROIDAB in the last 72 hours. Anemia Panel: Recent Labs    01/10/18 1057 01/11/18 1725  VITAMINB12 450  --   FOLATE  --  15.7   Sepsis Labs: No results for input(s): PROCALCITON,  LATICACIDVEN in the last 168 hours.  No results found for this or any previous visit (from the past 240 hour(s)).       Radiology Studies: No results found.      Scheduled Meds: . aspirin EC  81 mg Oral Daily  . atorvastatin  80 mg Oral q1800  . carvedilol  3.125 mg Oral BID WC  . clopidogrel  300 mg Oral Once  . clopidogrel  75 mg Oral Daily  . docusate sodium  100 mg Oral BID  . enoxaparin (LOVENOX) injection  40 mg Subcutaneous Q24H  . folic acid  1 mg Oral Daily  . magic mouthwash w/lidocaine  5 mL Oral QID  . nystatin  5 mL Oral QID  . polyethylene glycol  17 g Oral BID  . sodium chloride flush  3 mL Intravenous Q12H  . spironolactone  12.5 mg Oral Daily  . sucralfate  1 g Oral TID WC & HS  . vitamin C  500  mg Oral Daily  . zinc sulfate  220 mg Oral Daily   Continuous Infusions:   LOS: 2 days    Time spent: over 30 min    Lacretia Nicks, MD [facility] Pager [phone]  If 7PM-7AM, please contact night-coverage [email or web address] Password [facility] 01/12/2018, 5:44 PM

## 2018-01-12 NOTE — Progress Notes (Signed)
PT Cancellation Note  Patient Details Name: Ray Smith MRN: [id number] DOB: 05/24/1946   Cancelled Treatment:    Reason Eval/Treat Not Completed: Patient declined, no reason specified. Pt adamantly refusing PT, "I don't need no therapy. I am strong as a [place] if I can't get to eat and get my blood pressure right."   Angelina Ok [facility] 01/12/2018, 2:07 PM [employer] PT [phone]

## 2018-01-12 NOTE — Progress Notes (Signed)
Pt got up and sat in a recliner after requesting him couple of times otherwise he was refusing to get up, drinking ensure, called for barium swallow evaluation, it will occur at some point today, will continue to monitor the patient Lonia Farber, RN

## 2018-01-12 NOTE — Progress Notes (Signed)
CSW visited pt at bedside. Pt again reiterated no interest in SNF placement for short-term rehab. Receptive to [facility] services coming to residence. CSW acknowledged. Will continue to assist as needed.

## 2018-01-12 NOTE — Plan of Care (Signed)
Supportive care for odynophagia (PPI, +/- sucralfate suspension, magic mouthwash).  Awaiting barium swallow for next recommendations.

## 2018-01-12 NOTE — Progress Notes (Signed)
Radiology dept said that ordering physician need to call to radiologist to approve the order, and since they are gone today, it will not happen until tomorrow am or Monday morning  Lonia Farber, RN

## 2018-01-13 ENCOUNTER — Inpatient Hospital Stay (HOSPITAL_COMMUNITY): Payer: Medicare Other

## 2018-01-13 LAB — CBC
HCT: 34.2 % — ABNORMAL LOW (ref 39.0–52.0)
Hemoglobin: 10.6 g/dL — ABNORMAL LOW (ref 13.0–17.0)
MCH: 26.4 pg (ref 26.0–34.0)
MCHC: 31 g/dL (ref 30.0–36.0)
MCV: 85.3 fL (ref 78.0–100.0)
PLATELETS: 246 10*3/uL (ref 150–400)
RBC: 4.01 MIL/uL — ABNORMAL LOW (ref 4.22–5.81)
RDW: 14.8 % (ref 11.5–15.5)
WBC: 8.2 10*3/uL (ref 4.0–10.5)

## 2018-01-13 LAB — BASIC METABOLIC PANEL
Anion gap: 6 (ref 5–15)
BUN: 23 mg/dL (ref 8–23)
CO2: 25 mmol/L (ref 22–32)
CREATININE: 1.16 mg/dL (ref 0.61–1.24)
Calcium: 8.9 mg/dL (ref 8.9–10.3)
Chloride: 106 mmol/L (ref 98–111)
Glucose, Bld: 90 mg/dL (ref 70–99)
Potassium: 3.7 mmol/L (ref 3.5–5.1)
SODIUM: 137 mmol/L (ref 135–145)

## 2018-01-13 LAB — MAGNESIUM: MAGNESIUM: 2 mg/dL (ref 1.7–2.4)

## 2018-01-13 LAB — HIV ANTIBODY (ROUTINE TESTING W REFLEX): HIV SCREEN 4TH GENERATION: NONREACTIVE

## 2018-01-13 MED ORDER — POTASSIUM CHLORIDE CRYS ER 20 MEQ PO TBCR
40.0000 meq | EXTENDED_RELEASE_TABLET | Freq: Once | ORAL | Status: AC
  Administered 2018-01-13: 40 meq via ORAL
  Filled 2018-01-13: qty 2

## 2018-01-13 MED ORDER — IOPAMIDOL (ISOVUE-300) INJECTION 61%
INTRAVENOUS | Status: AC
  Filled 2018-01-13: qty 50
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
## 2018-01-13 NOTE — Progress Notes (Signed)
Patient verbally expressed feelings of wanting to kill himself and his son. He said " I'm going to kill myself when I get home" and "Im going to kill my son before I kill myself. Dr. Lowell Guitar informed of situation.

## 2018-01-13 NOTE — Progress Notes (Signed)
Pt oxygen is weaned off to room air, oxygen saturation is 96% in RA

## 2018-01-13 NOTE — Consult Note (Signed)
[facility] Consultation Note  Referring Provider: Lacretia Nicks, MD [facility]) Primary Care Physician:  Patient, No Pcp Per  Reason for Consultation:  odynophagia  HPI: Ray Smith is a 72 y.o. male whom we've been asked to see for odynophagia.  Patient with recent STEMI with stenting and clopidigrel therapy.  Since that time, patient has had progressive odynophagia with most foods/liquids.  No dysphagia.  Prior constipation and abdominal pain, resolved with stool regimen.  No hematemesis or blood in stool.  Is slowly and progressively losing weight.  Barium swallow recently done, showed no esophageal lesion/stricture/mass/ulceration, but did show diffuse esophageal dysmotility.   Past Medical History:  Diagnosis Date  . Acute ST elevation myocardial infarction (STEMI) involving left anterior descending (LAD) coronary artery (HCC) 12/30/2017  . Acute systolic heart failure (HCC) 12/30/2017  . Coronary artery disease    a.  presented with CP and inf STE but no ACS - LHC (10/23/13):  Dist LM 40-60%, ostial LAD 80%, mid LAD 50%, ostial CFX 50-70%, mid RCA 50%.  EF 60%. - CP not felt to be ischemic; CABG vs Med Rx d/w pt - pt opted for Med Rx  . History of kidney stones    "more than 20" (01/09/2018)  . Hyperlipidemia   . Hypertension   . Marijuana abuse   . Tobacco dependence     Past Surgical History:  Procedure Laterality Date  . CORONARY/GRAFT ACUTE MI REVASCULARIZATION N/A 12/30/2017   Procedure: Coronary/Graft Acute MI Revascularization;  Surgeon: Tonny Bollman, MD;  Location: [facility] INVASIVE CV LAB;  Service: Cardiovascular;  Laterality: N/A;  Distal LM into LAD Xience [place] 3.5x4mm Ostial Cx Xience [place] 3.5x78mm  . LEFT HEART CATH AND CORONARY ANGIOGRAPHY N/A 12/30/2017   Procedure: LEFT HEART CATH AND CORONARY ANGIOGRAPHY;  Surgeon: Tonny Bollman, MD;  Location: [facility] INVASIVE CV LAB;  Service: Cardiovascular;  Laterality: N/A;  . LEFT HEART CATHETERIZATION WITH CORONARY  ANGIOGRAM Bilateral 10/23/2013   Procedure: LEFT HEART CATHETERIZATION WITH CORONARY ANGIOGRAM;  Surgeon: Lesleigh Noe, MD;  Location: [facility] CATH LAB;  Service: Cardiovascular;  Laterality: Bilateral;  . snake bite surgery    . TONSILLECTOMY  1955    Prior to Admission medications   Medication Sig Start Date End Date Taking? Authorizing Provider  aspirin EC 81 MG EC tablet Take 1 tablet (81 mg total) by mouth daily. 01/03/18  Yes Arty Baumgartner, NP  atorvastatin (LIPITOR) 80 MG tablet Take 1 tablet (80 mg total) by mouth daily at 6 PM. 01/02/18  Yes Arty Baumgartner, NP  carvedilol (COREG) 3.125 MG tablet Take 1 tablet (3.125 mg total) by mouth 2 (two) times daily with a meal. 01/02/18  Yes Arty Baumgartner, NP  clopidogrel (PLAVIX) 75 MG tablet Take 1 tablet (75 mg total) by mouth daily. 01/03/18  Yes Arty Baumgartner, NP  nitroGLYCERIN (NITROSTAT) 0.4 MG SL tablet Place 1 tablet (0.4 mg total) under the tongue every 5 (five) minutes x 3 doses as needed for chest pain. 01/02/18  Yes Arty Baumgartner, NP  spironolactone (ALDACTONE) 25 MG tablet Take 0.5 tablets (12.5 mg total) by mouth daily. 01/03/18  Yes Arty Baumgartner, NP    Current Facility-Administered Medications  Medication Dose Route Frequency Provider Last Rate Last Dose  . acetaminophen (TYLENOL) tablet 650 mg  650 mg Oral Q6H PRN Jonah Blue, MD       Or  . acetaminophen (TYLENOL) suppository 650 mg  650 mg Rectal Q6H PRN Jonah Blue, MD      .  aspirin EC tablet 81 mg  81 mg Oral Daily Jonah Blue, MD   81 mg at 01/13/18 0910  . atorvastatin (LIPITOR) tablet 80 mg  80 mg Oral q1800 Jonah Blue, MD   80 mg at 01/12/18 1725  . carvedilol (COREG) tablet 3.125 mg  3.125 mg Oral BID WC Jonah Blue, MD   3.125 mg at 01/13/18 0910  . clopidogrel (PLAVIX) tablet 300 mg  300 mg Oral Once Jonah Blue, MD      . clopidogrel (PLAVIX) tablet 75 mg  75 mg Oral Daily Jonah Blue, MD   75 mg at 01/13/18 0910   . docusate sodium (COLACE) capsule 100 mg  100 mg Oral BID Jonah Blue, MD   100 mg at 01/13/18 0910  . enoxaparin (LOVENOX) injection 40 mg  40 mg Subcutaneous Q24H Zigmund Daniel., MD   40 mg at 01/12/18 2110  . folic acid (FOLVITE) tablet 1 mg  1 mg Oral Daily Marlin Canary U, DO   1 mg at 01/13/18 0910  . magic mouthwash w/lidocaine  5 mL Oral QID Jonah Blue, MD   5 mL at 01/13/18 0911  . nystatin (MYCOSTATIN) 100000 UNIT/ML suspension 500,000 Units  5 mL Oral QID Marlin Canary U, DO   500,000 Units at 01/13/18 0909  . ondansetron (ZOFRAN) tablet 4 mg  4 mg Oral Q6H PRN Jonah Blue, MD       Or  . ondansetron [facility]) injection 4 mg  4 mg Intravenous Q6H PRN Jonah Blue, MD      . pantoprazole (PROTONIX) EC tablet 40 mg  40 mg Oral Daily Zigmund Daniel., MD   40 mg at 01/13/18 0910  . polyethylene glycol (MIRALAX / GLYCOLAX) packet 17 g  17 g Oral BID Zigmund Daniel., MD   17 g at 01/13/18 0914  . sodium chloride flush (NS) 0.9 % injection 3 mL  3 mL Intravenous Q12H Jonah Blue, MD   3 mL at 01/13/18 0914  . spironolactone (ALDACTONE) tablet 12.5 mg  12.5 mg Oral Daily Jonah Blue, MD   12.5 mg at 01/13/18 0911  . sucralfate (CARAFATE) 1 GM/10ML suspension 1 g  1 g Oral TID WC & HS Zigmund Daniel., MD   1 g at 01/13/18 1030  . vitamin C (ASCORBIC ACID) tablet 500 mg  500 mg Oral Daily Zigmund Daniel., MD   500 mg at 01/13/18 1194  . zinc sulfate capsule 220 mg  220 mg Oral Daily Marlin Canary U, DO   220 mg at 01/13/18 1740    Allergies as of 01/09/2018 - Review Complete 01/09/2018  Allergen Reaction Noted  . Tramadol Anaphylaxis 09/25/2017  . Flexeril [cyclobenzaprine] Other (See Comments) 10/23/2013  . Ibuprofen Other (See Comments) 03/13/2015  . Lactose intolerance (gi) Other (See Comments) 10/06/2017    History reviewed. No pertinent family history.  Social History   Socioeconomic History  . Marital status: Divorced     Spouse name: Not on file  . Number of children: Not on file  . Years of education: Not on file  . Highest education level: Not on file  Occupational History  . Not on file  Social Needs  . Financial resource strain: Not on file  . Food insecurity:    Worry: Not on file    Inability: Not on file  . Transportation needs:    Medical: Not on file    Non-medical: Not on file  Tobacco Use  .  Smoking status: Former Smoker    Types: Cigarettes  . Smokeless tobacco: Never Used  Substance and Sexual Activity  . Alcohol use: No  . Drug use: Yes    Types: Marijuana  . Sexual activity: Not on file  Lifestyle  . Physical activity:    Days per week: Not on file    Minutes per session: Not on file  . Stress: Not on file  Relationships  . Social connections:    Talks on phone: Not on file    Gets together: Not on file    Attends religious service: Not on file    Active member of club or organization: Not on file    Attends meetings of clubs or organizations: Not on file    Relationship status: Not on file  . Intimate partner violence:    Fear of current or ex partner: Not on file    Emotionally abused: Not on file    Physically abused: Not on file    Forced sexual activity: Not on file  Other Topics Concern  . Not on file  Social History Narrative  . Not on file    Review of Systems: As per HPI, all others negative Physical Exam: Vital signs in last 24 hours: Temp:  [97.8 F (36.6 C)-97.9 F (36.6 C)] 97.8 F (36.6 C) (07/21 0514) Pulse Rate:  [80-93] 80 (07/21 1147) Resp:  [18] 18 (07/21 1147) BP: (80-127)/(62-85) 102/75 (07/21 1147) SpO2:  [91 %-93 %] 91 % (07/21 0514) Weight:  [51.6 kg (113 lb 11.2 oz)] 51.6 kg (113 lb 11.2 oz) (07/21 0514) Last BM Date: 01/11/18 General:   Alert, cachectic-appearing, older-appearing than stated age, dissheveled appearance, cooperative in NAD Head:  Normocephalic and atraumatic. Eyes:  Sclera clear, no icterus.   Conjunctiva  pink. Ears:  Normal auditory acuity. Nose:  No deformity, discharge,  or lesions. Mouth: Poor dentition; aphthous ulcerations in oropharynx. Neck:  Supple; no masses or thyromegaly. Lungs:  Somewhat tachypneic at rest, Clear throughout to auscultation.   No wheezes, crackles, or rhonchi. No acute distress. Heart:  Regular rate and rhythm; no murmurs, clicks, rubs,  or gallops. Abdomen:  Soft, nontender and nondistended. No masses, hepatosplenomegaly or hernias noted. Normal bowel sounds, without guarding, and without rebound.     Msk:  Atrophic but symmetric without gross deformities. Normal posture. Pulses:  Normal pulses noted. Extremities:  RLE boot (PVD with poor flow) Neurologic:  Alert and  oriented x4; diffusely weak, otherwise grossly normal neurologically. Skin:  Scattered ecchymoses, otherwise intact without significant lesions or rashes. Psych:  Alert and cooperative. Normal mood and affect.  Lab Results: Recent Labs    01/11/18 0551 01/12/18 0609 01/13/18 0616  WBC 8.5 9.5 8.2  HGB 11.3* 11.0* 10.6*  HCT 35.4* 35.0* 34.2*  PLT 270 255 246   BMET Recent Labs    01/11/18 0551 01/12/18 0609 01/13/18 0616  NA 137 138 137  K 3.5 3.3* 3.7  CL 105 108 106  CO2 23 25 25   GLUCOSE 86 96 90  BUN 17 22 23   CREATININE 1.23 1.30* 1.16  CALCIUM 8.7* 8.8* 8.9   LFT No results for input(s): PROT, ALBUMIN, AST, ALT, ALKPHOS, BILITOT, BILIDIR, IBILI in the last 72 hours. PT/INR No results for input(s): LABPROT, INR in the last 72 hours.  Studies/Results: Dg Esophagus  Result Date: 01/13/2018 CLINICAL DATA:  Dysphagia. Unable to eat. Painful oral ulcers with linear fissures on the tongue. EXAM: ESOPHOGRAM / BARIUM SWALLOW / BARIUM TABLET  STUDY TECHNIQUE: Combined double contrast and single contrast examination performed using effervescent crystals, thick barium liquid, and thin barium liquid. The patient was observed with fluoroscopy swallowing a 13 mm barium sulphate tablet.  FLUOROSCOPY TIME:  Fluoroscopy Time:  2 minutes 42 seconds Radiation Exposure Index (if provided by the fluoroscopic device): 597.96 uGy*m2 Number of Acquired Spot Images: 0 COMPARISON:  None. FINDINGS: The patient swallowed barium without difficulty. Normal primary esophageal peristalsis was demonstrated with significantly impaired secondary peristalsis and intermittent mild diffuse tertiary peristaltic activity. A small sliding hiatal hernia was demonstrated. Also demonstrated was hypertrophy of the cricopharyngeus muscle. No ulcerations, masses or strictures were seen. No gastroesophageal reflux seen during the examination. The patient swallowed a 12.5 mm in diameter barium tablet without difficulty. This passed normally through the esophagus and into the stomach. IMPRESSION: 1. Small sliding hiatal hernia. 2. Hypertrophy of the cricopharyngeus muscle. 3. Significantly impaired secondary esophageal peristalsis. 4. Mild intermittent diffuse tertiary peristaltic activity throughout the esophagus. 5. No ulceration, mass or stricture seen. Electronically Signed   By: Beckie Salts M.D.   On: 01/13/2018 10:52    Impression:  1.  Odynophagia.  New onset, since recent admission for myocardial infarction.  Barium swallow shows dysmotility, but no stricture or esophageal ulceration seen. 2.  Feeding difficulties, secondary to #1 above.  Patient reports that he continues to receive acidic foodstuffs/liquids despite reporting that this causes prohibitive odynophagia. 3.  CAD with recent STEMI.  Has to stay on clopidigrel. 4.  COPD.  Short-ness of breath and weakness at reset during my exam today.  Plan:  1.  Patient's cardiorespiratory status (which may very well be chronic or acute on chronic) is rather tenuous; in light of this, the unrevealing barium esophagram, and the fact he has to stay on clopidigrel (making biopsies risky), overall the risks of endoscopy far outweigh the benefits for the foreseeable  future. 2.  Supportive therapy:  Continue sucralfate suspension, magic mouthwash, PPI. 3.  Diet:  Avoid acidic products; and, on the whole, let patient work with dietary folks and guide what types of foods/liquids he can eat (e.g., he has been able to tolerate unheated chicken noodle soup and watermelon beverage, but has had troubles with other products). 4.  Agree with work-up of vitamin deficiencies.   5.  Not much unfortunately to offer from GI perspective. 6.  [facility] will sign-off; please call with questions; thank you for the consultation.   LOS: 3 days   Aslan Himes M  01/13/2018, 1:44 PM  Cell [phone] If no answer or after 5 PM call [phone]

## 2018-01-14 DIAGNOSIS — F4325 Adjustment disorder with mixed disturbance of emotions and conduct: Secondary | ICD-10-CM

## 2018-01-14 LAB — HERPES SIMPLEX VIRUS(HSV) DNA BY PCR
HSV 1 DNA: POSITIVE — AB
HSV 2 DNA: NEGATIVE

## 2018-01-14 LAB — VITAMIN C: VITAMIN C: 0.4 mg/dL (ref 0.2–2.0)

## 2018-01-14 LAB — BASIC METABOLIC PANEL
Anion gap: 8 (ref 5–15)
BUN: 21 mg/dL (ref 8–23)
CO2: 23 mmol/L (ref 22–32)
Calcium: 8.7 mg/dL — ABNORMAL LOW (ref 8.9–10.3)
Chloride: 106 mmol/L (ref 98–111)
Creatinine, Ser: 1.26 mg/dL — ABNORMAL HIGH (ref 0.61–1.24)
GFR calc non Af Amer: 55 mL/min — ABNORMAL LOW (ref >60)
Glucose, Bld: 90 mg/dL (ref 70–99)
Potassium: 4.2 mmol/L (ref 3.5–5.1)
Sodium: 137 mmol/L (ref 135–145)

## 2018-01-14 LAB — CBC
HCT: 36.8 % — ABNORMAL LOW (ref 39.0–52.0)
Hemoglobin: 11.5 g/dL — ABNORMAL LOW (ref 13.0–17.0)
MCH: 26.8 pg (ref 26.0–34.0)
MCHC: 31.3 g/dL (ref 30.0–36.0)
MCV: 85.8 fL (ref 78.0–100.0)
PLATELETS: 250 10*3/uL (ref 150–400)
RBC: 4.29 MIL/uL (ref 4.22–5.81)
RDW: 15 % (ref 11.5–15.5)
WBC: 8.2 10*3/uL (ref 4.0–10.5)

## 2018-01-14 LAB — MAGNESIUM: Magnesium: 1.9 mg/dL (ref 1.7–2.4)

## 2018-01-14 MED ORDER — ACYCLOVIR 400 MG PO TABS
400.0000 mg | ORAL_TABLET | Freq: Three times a day (TID) | ORAL | Status: DC
  Filled 2018-01-14: qty 1

## 2018-01-14 MED ORDER — SUCRALFATE 1 GM/10ML PO SUSP
1.0000 g | Freq: Three times a day (TID) | ORAL | Status: DC
  Administered 2018-01-14 – 2018-01-19 (×19): 1 g via ORAL
  Filled 2018-01-14 (×20): qty 10

## 2018-01-14 MED ORDER — ACYCLOVIR 400 MG PO TABS
400.0000 mg | ORAL_TABLET | Freq: Three times a day (TID) | ORAL | Status: DC
  Administered 2018-01-14 – 2018-01-19 (×16): 400 mg via ORAL
  Filled 2018-01-14 (×18): qty 1

## 2018-01-14 NOTE — Progress Notes (Signed)
Physical Therapy Treatment Patient Details Name: Ray Smith MRN: [id number] DOB: 1945/07/27 Today's Date: 01/14/2018    History of Present Illness Pt is a 72 y.o. M with significant for hyperlipidemia, hypertension, ICM, tobacco use, COPD, CKD stage 2, and 7/6 STEMI of the LAD s/p PCI; EEG revealed RBBB, nonspecific ST changes with no evidence of acute ischemia    PT Comments    Pt agreeable to mobilize from bed to chair only and declined further ambulation. Refused use of RW for transfer, though he would benefit from AD due to instability and history of falls. Will continue to follow acutely to maximize functional independence and safety with mobility.    Follow Up Recommendations  SNF     Equipment Recommendations  None recommended by PT    Recommendations for Other Services       Precautions / Restrictions Precautions Precautions: Fall Precaution Comments: admitted s/p fall Restrictions Weight Bearing Restrictions: No    Mobility  Bed Mobility Overal bed mobility: Modified Independent                Transfers Overall transfer level: Needs assistance Equipment used: None Transfers: Sit to/from Stand Sit to Stand: Min guard         General transfer comment: min guard as pt refused use of RW for stability.   Ambulation/Gait Ambulation/Gait assistance: Min guard Gait Distance (Feet): 3 Feet Assistive device: None Gait Pattern/deviations: Step-through pattern;Narrow base of support     General Gait Details: pt with modest instability walking from bed to recliner chair. Refused use of RW. Would benefit from AD for longer distances. Pt likely capable of ambulating farther, however refused any distance farther than the chair.   Stairs             Wheelchair Mobility    Modified Rankin (Stroke Patients Only)       Balance Overall balance assessment: Needs assistance Sitting-balance support: No upper extremity supported;Feet  supported Sitting balance-Leahy Scale: Good     Standing balance support: No upper extremity supported;During functional activity Standing balance-Leahy Scale: Fair Standing balance comment: Pt refusing RW, despite balance deficits.                            Cognition Arousal/Alertness: Awake/alert Behavior During Therapy: WFL for tasks assessed/performed Overall Cognitive Status: No family/caregiver present to determine baseline cognitive functioning                                 General Comments: Pt tangential and poor safety awareness      Exercises      General Comments General comments (skin integrity, edema, etc.): Pt on 2L O2 on arrival to room. Supplamental O2 removed for transfer. Unable to get reading on SpO2, however pt did appear OOB. O2 via nasal cannula repaced at end of session.       Pertinent Vitals/Pain Pain Assessment: Faces Faces Pain Scale: No hurt    Home Living                      Prior Function            PT Goals (current goals can now be found in the care plan section) Acute Rehab PT Goals Patient Stated Goal: go home PT Goal Formulation: With patient Time For Goal Achievement: 01/25/18 Potential to Achieve Goals: Fair Progress  towards PT goals: Progressing toward goals    Frequency    Min 3X/week      PT Plan Current plan remains appropriate    Co-evaluation              AM-PAC PT "6 Clicks" Daily Activity  Outcome Measure  Difficulty turning over in bed (including adjusting bedclothes, sheets and blankets)?: None Difficulty moving from lying on back to sitting on the side of the bed? : None Difficulty sitting down on and standing up from a chair with arms (e.g., wheelchair, bedside commode, etc,.)?: Unable Help needed moving to and from a bed to chair (including a wheelchair)?: A Little Help needed walking in hospital room?: A Little Help needed climbing 3-5 steps with a railing? :  Total 6 Click Score: 16    End of Session Equipment Utilized During Treatment: Gait belt Activity Tolerance: Other (comment)(self limiting as pt refused ambulation farther than chair) Patient left: with call bell/phone within reach;in chair;with chair alarm set Nurse Communication: Mobility status PT Visit Diagnosis: Muscle weakness (generalized) (M62.81);History of falling (Z91.81);Difficulty in walking, not elsewhere classified (R26.2)     Time: 6384-5364 PT Time Calculation (min) (ACUTE ONLY): 20 min  Charges:  $Therapeutic Activity: 8-22 mins                    G Codes:      Kallie Locks, [place] Pager [phone] Acute Rehab  Sheral Apley 01/14/2018, 2:54 PM
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
## 2018-01-14 NOTE — Progress Notes (Signed)
PT Cancellation Note  Patient Details Name: RAFAEL COGAR MRN: [id number] DOB: 09/19/1945   Cancelled Treatment:    Reason Eval/Treat Not Completed: Patient declined, no reason specified. Pt reports he was not able to eat breakfast and does not want to participate with therapy until he has some food in his stomach. He states he is agreeable to attempt therapy this afternoon. Will check back as time allows.    Sheral Apley 01/14/2018, 12:35 PM

## 2018-01-14 NOTE — Progress Notes (Signed)
Pt wanted Lovenox in the arm, ran it by the pharmacist on duty and she said it was okay. Will administer the medication in the right upper arm. Will continue to monitor.
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
## 2018-01-15 LAB — HERPES SIMPLEX VIRUS(HSV) DNA BY PCR
HSV 1 DNA: POSITIVE — AB
HSV 1 DNA: POSITIVE — AB
HSV 2 DNA: NEGATIVE
HSV 2 DNA: NEGATIVE

## 2018-01-15 LAB — BASIC METABOLIC PANEL
ANION GAP: 6 (ref 5–15)
BUN: 21 mg/dL (ref 8–23)
CALCIUM: 8.6 mg/dL — AB (ref 8.9–10.3)
CO2: 24 mmol/L (ref 22–32)
Chloride: 108 mmol/L (ref 98–111)
Creatinine, Ser: 1.28 mg/dL — ABNORMAL HIGH (ref 0.61–1.24)
GFR, EST NON AFRICAN AMERICAN: 54 mL/min — AB (ref >60)
Glucose, Bld: 92 mg/dL (ref 70–99)
POTASSIUM: 4.1 mmol/L (ref 3.5–5.1)
SODIUM: 138 mmol/L (ref 135–145)

## 2018-01-15 LAB — CBC
HCT: 34.8 % — ABNORMAL LOW (ref 39.0–52.0)
Hemoglobin: 10.8 g/dL — ABNORMAL LOW (ref 13.0–17.0)
MCH: 26.5 pg (ref 26.0–34.0)
MCHC: 31 g/dL (ref 30.0–36.0)
MCV: 85.3 fL (ref 78.0–100.0)
Platelets: 221 10*3/uL (ref 150–400)
RBC: 4.08 MIL/uL — AB (ref 4.22–5.81)
RDW: 14.8 % (ref 11.5–15.5)
WBC: 6.4 10*3/uL (ref 4.0–10.5)

## 2018-01-15 LAB — MAGNESIUM: MAGNESIUM: 1.8 mg/dL (ref 1.7–2.4)

## 2018-01-15 MED ORDER — ENSURE ENLIVE PO LIQD
237.0000 mL | Freq: Every day | ORAL | Status: DC
  Administered 2018-01-16 – 2018-01-18 (×3): 237 mL via ORAL
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
## 2018-01-15 NOTE — Progress Notes (Addendum)
Nutrition Follow-up  DOCUMENTATION CODES:   Severe malnutrition in context of chronic illness, Underweight  INTERVENTION:   -Initiated 48 hour calorie count per MD -D/c Magic Cup TID, due to poor acceptance -D/c ice cream with meals, due to poor acceptance -Advance diet to dysphagia 1 diet; received verbal permission from MD to order -Continue MVI with minerals daily -Ensure Enlive po daily, each supplement provides 350 kcal and 20 grams of protein -[employer] with lactose free milk with meals, each supplement provides 290 kcals and 13 grams protein -[employer] Shake TID with meals, each supplement provides 520 kcals and 22 grams protein -RD obtained food preferences and meal orders- entered meal orders into system for next 48 hours and updated food preferences into [employer] [facility] meal ordering system -Consider initiation of nutrition support. Recommend:  Initiate Jevity 1.2 @ 25 ml/hr and increase by 10 ml every 12 hours to goal rate of 65 ml/hr.   Tube feeding regimen provides 1872 kcal (100% of needs), 87 grams of protein, and 1259 ml of H2O.   -If nutrition support is initiated, recommend monitoring Mg, K, and Phos daily x 3 days, due to refeeding risk   NUTRITION DIAGNOSIS:   Severe Malnutrition related to chronic illness as evidenced by moderate fat depletion, severe fat depletion, severe muscle depletion, percent weight loss(10.2% weight loss in 3 months).  Ongoing  GOAL:   Patient will meet greater than or equal to 90% of their needs  Unmet  MONITOR:   PO intake, Supplement acceptance, Weight trends, I & O's, Labs, Skin  REASON FOR ASSESSMENT:   Consult Assessment of nutrition requirement/status  ASSESSMENT:   72 year old male who presented to the ED with chest pain. Pt was recently admitted and diagnosed with a STEMI of the LAD. Pt had a stent placed and was discharged home. PMH significant for hyperlipidemia, hypertension, ICM, tobacco use, and  noncompliance.  7/21- s/p esophagram, which reveal esophageal dysmotility per GI notes  Spoke with pt at length at bedside (approximately 30 minutes). Pt expresses frustration over poor oral intake related to mouth pain and excessive salty taste to foods. He shares that he has been consuming mainly liquids since his recent heart attack. He describes poor tolerance to foods and complains of his mouth burning. His intake has been very minimal- he reports he has been consuming mainly outside foods during this admission (per pt, food intake consists only of tea, milk, canned chicken noodle soup brought from home, thinned grits, and [place] brand watermelon juice).   Pt expresses concern over weight loss and weakness. Reviewed menu items and food preferences. Pt does not like Magic Cups or ice cream. He reports drinks "less than one Ensure" daily. Upon review of menu items on full liquid diet, pt refused to eat most offered items- requesting only tea, sprite, 7-up, watered down grits, and "bean or celery juice". Given pt's limited food preferences, severe malnutrition, and poor oral intake, RD suspects that pt will be unable to meet nutritional needs via PO intake and would likely need supplemental nutrition support.   Discussed concern with pt about being able to maintain nutritional status long term consuming a liquid diet exclusively. Pt amenable to trying a pureed consistency diet (dysphagia 1); RD personally obtained meal orders and preferences over the next 48 hours and entered through meal ordering system. Educated pt on importance of good nutritional intake and discussed options if pt continues to not consume adequate PO's. Broached idea of a short term feeding tube (  ex cortrak); pt initially hesitant, but encouraged him to think about it.   Case discussed with MD (Dr. Lowell Guitar); received verbal order to advance diet to dysphagia 1 diet. Also informed MD regarding recommendation for nutrition support and  of overall conversation RD had with pt.   Labs reviewed.   Diet Order:   Diet Order           DIET - DYS 1 Room service appropriate? Yes; Fluid consistency: Thin  Diet effective now          EDUCATION NEEDS:   No education needs have been identified at this time  Skin:  Skin Assessment: Reviewed RN Assessment  Last BM:  01/12/18  Height:   Ht Readings from Last 1 Encounters:  01/09/18 5\' 10"  (1.778 m)    Weight:   Wt Readings from Last 1 Encounters:  01/14/18 110 lb 10.7 oz (50.2 kg)    Ideal Body Weight:  74.45 kg  BMI:  Body mass index is 15.88 kg/m.  Estimated Nutritional Needs:   Kcal:  1700-1900 kcal/day  Protein:  85-100 grams/day  Fluid:  >/= 1.7 L/day    Hilaria Titsworth A. Mayford Knife, RD, LDN, CDE Pager: [phone] After hours Pager: [phone]

## 2018-01-16 DIAGNOSIS — F4325 Adjustment disorder with mixed disturbance of emotions and conduct: Secondary | ICD-10-CM

## 2018-01-16 LAB — MAGNESIUM: MAGNESIUM: 1.7 mg/dL (ref 1.7–2.4)

## 2018-01-16 LAB — BASIC METABOLIC PANEL
Anion gap: 9 (ref 5–15)
BUN: 20 mg/dL (ref 8–23)
CHLORIDE: 105 mmol/L (ref 98–111)
CO2: 22 mmol/L (ref 22–32)
CREATININE: 1.13 mg/dL (ref 0.61–1.24)
Calcium: 8.4 mg/dL — ABNORMAL LOW (ref 8.9–10.3)
GFR calc Af Amer: >60 mL/min (ref >60)
Glucose, Bld: 98 mg/dL (ref 70–99)
Potassium: 3.4 mmol/L — ABNORMAL LOW (ref 3.5–5.1)
SODIUM: 136 mmol/L (ref 135–145)

## 2018-01-16 LAB — CBC
HEMATOCRIT: 31.6 % — AB (ref 39.0–52.0)
HEMOGLOBIN: 9.8 g/dL — AB (ref 13.0–17.0)
MCH: 26.3 pg (ref 26.0–34.0)
MCHC: 31 g/dL (ref 30.0–36.0)
MCV: 84.9 fL (ref 78.0–100.0)
PLATELETS: 194 10*3/uL (ref 150–400)
RBC: 3.72 MIL/uL — AB (ref 4.22–5.81)
RDW: 15 % (ref 11.5–15.5)
WBC: 6.6 10*3/uL (ref 4.0–10.5)

## 2018-01-16 LAB — PHOSPHORUS: Phosphorus: 2.2 mg/dL — ABNORMAL LOW (ref 2.5–4.6)

## 2018-01-16 NOTE — Progress Notes (Signed)
PT Cancellation Note  Patient Details Name: Ray Smith MRN: [id number] DOB: 1945-11-07   Cancelled Treatment:    Reason Eval/Treat Not Completed: Other (comment).  Pt refusing therapy mutl times even after discussing his values for BP which were charted and acceptable.  Will try again tomorrow.   Ivar Drape 01/16/2018, 3:44 PM   Samul Dada, PT MS Acute Rehab Dept. Number: [facility] [id number] and [facility] [phone]

## 2018-01-16 NOTE — Care Management Important Message (Signed)
Important Message  Patient Details  Name: Ray Smith MRN: [id number] Date of Birth: 10/10/1945   Medicare Important Message Given:  Yes    Dorena Bodo 01/16/2018, 4:11 PM
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
## 2018-01-16 NOTE — Progress Notes (Signed)
Patient is refusing SNF placement and refusing all HHC services at this time. Patient stated " when I go home I will be fine." CM will continue to follow for progression of care; B Shelba Flake [phone]

## 2018-01-16 NOTE — Progress Notes (Signed)
Pt is pretty much in bed just getting up using BSC twice, refusing to sit in a chair, vitals stable, denies any pain, appetite moderate, will continue to monitor  Lonia Farber, RN

## 2018-01-16 NOTE — Progress Notes (Signed)
Calorie Count Note  48 hour calorie count ordered.  Diet: Dysphagia 1 diet with thin liquids Supplements: MVI with minerals daily; Ensure Enlive po daily, each supplement provides 350 kcal and 20 grams of protein; [employer] with lactose free milk with meals, each supplement provides 290 kcals and 13 grams protein; Hormel Shake TID with meals, each supplement provides 520 kcals and 22 grams protein  Spoke with Dr. Lowell Guitar late yesterday afternoon (around 1600). MD is hopeful that pt's intake will improve with acyclovir. Pt refused feeding tube placement per MD.   Sherron Monday with pt at length at bedside. He still has multiple complaints regarding food he received. Overall, he reports improved intake with pureed diet texture, but does not like pureed chicken or pureed [place]. He really enjoys pureed vegetables and whole milk. Noted pt consumed 100% of Hormel shake (found in trash bin). Pt also reports consuming approximately 2/3 of Ensure supplement last night.   RD updated additional food preferences with pt. Also obtained meal orders; RD personally entered meal orders into system and added a manager's check to trays, as pt reports he continues to receive items he does not like, such as orange juice.   RD provided pt with encouragement and is agreeable to plan. Discussed importance of good meal and supplement intake to promote healing. He shares he will only consumes one Ensure supplement and one Hormel shake daily.   Of note, about 50% of total intake is coming from Hromel shake and Ensure supplements.   01/15/18 Breakfast: 673 kcals and 30 grams protein Lunch: 253 kcals and 6 grams protein Dinner: 547 kcals and 20 grams protein Supplements: 2/3 Ensure Enlive supplement (235 kcals and 13 grams protein)  Total intake: 1708 kcal (100% of minimum estimated needs)  69 grams protein (81% of minimum estimated needs)  Nutrition Dx: Severe Malnutrition related to chronic illness as  evidenced by moderate fat depletion, severe fat depletion, severe muscle depletion, percent weight loss(10.2% weight loss in 3 months); ongoing  Goal: Patient will meet greater than or equal to 90% of their needs; progressing  Intervention:   -Continue with calorie count -Continue MVI with minerals daily -D/c Carnation Breakfast, due to poor acceptance -Ensure Enlive po daily, each supplement provides 350 kcal and 20 grams of protein -Hormel shake daily, each supplement provides 520 kcals and 22 grams protein -RD will continue to personally obtain meal orders per pt preferences in attempt to maximize intake -Whole milk with all meals -Added manager's check to meal trays to assist with tray accuracy  Shakyla Nolley A. Mayford Knife, RD, LDN, CDE Pager: [phone] After hours Pager: [phone]

## 2018-01-17 NOTE — Progress Notes (Signed)
PT Cancellation Note  Patient Details Name: Ray Smith MRN: [id number] DOB: [birth year]   Cancelled Treatment:    Reason Eval/Treat Not Completed: Other (comment) Pt adamantly refusing therapy today citing BP issues, R foot pain, says he will go to chair tonight with nurses, requests we try him tomorrow. Unable to motivate pt. Will cont to follow.   Etta Grandchild, PT, DPT Acute Rehab Services Pager: [phone]     Etta Grandchild 01/17/2018, 11:17 AM

## 2018-01-17 NOTE — Plan of Care (Signed)
  Problem: Nutrition: Goal: Adequate nutrition will be maintained Outcome: Completed/Met   Problem: Safety: Goal: Ability to remain free from injury will improve Outcome: Completed/Met   Problem: Skin Integrity: Goal: Risk for impaired skin integrity will decrease Outcome: Completed/Met   

## 2018-01-17 NOTE — Plan of Care (Signed)
  Problem: Activity: Goal: Risk for activity intolerance will decrease Outcome: Completed/Met   Problem: Nutrition: Goal: Adequate nutrition will be maintained Outcome: Completed/Met   Problem: Safety: Goal: Ability to remain free from injury will improve Outcome: Completed/Met   Problem: Skin Integrity: Goal: Risk for impaired skin integrity will decrease Outcome: Completed/Met

## 2018-01-17 NOTE — Progress Notes (Addendum)
Calorie Count Note  48 hour calorie count ordered.  Diet: Dysphagia 1 diet with thin liquids Supplements: MVI with minerals daily; Ensure Enlive po daily, each supplement provides 350 kcal and 20 grams of protein; Hormel shake daily, each supplement provides 520 kcals and 22 grams protein  ADDENDUM (1530): Paged by RN to see pt per by his request. Attempted to speak with pt x 2 today, however, pt was sleeping soundly at times of visits and RD did not disturb. Observed lunch tray at bedside- pt consumed only bites and sips of mashed potatoes, meat, and vegetables and 1.5 cartons of whole milk. No meal tickets were available for review during calorie count period, so RD estimated pt intake via pt interviews and meal completion documentation.   01/15/18 Breakfast: 673 kcals and 30 grams protein Lunch: 253 kcals and 6 grams protein Dinner: 547 kcals and 20 grams protein Supplements: 2/3 Ensure Enlive supplement (235 kcals and 13 grams protein)  Total intake: 1708 kcal (100% of minimum estimated needs)  69 grams protein (81% of minimum estimated needs)  01/16/09 Breakfast: 638 kcals and 20 grams protein Lunch: 449 kcals and 11 grams protein Dinner: 1000 kcals and 29 grams protein Supplements: Once Ensure Enlive supplement (350 kcals and 20 grams protein)  Total intake: 2437 kcal (143% of minimum estimated needs)  80 grams protein (94% of minimum estimated needs)  Average Total intake: 2073 kcal (>100% of minimum estimated needs)  75 grams protein (88% of minimum estimated needs)  Nutrition Dx: Severe Malnutritionrelated to chronic illnessas evidenced by moderate fat depletion, severe fat depletion, severe muscle depletion, percent weight loss(10.2% weight loss in 3 months); ongoing  Goal: Patient will meet greater than or equal to 90% of their needs; progressing  Intervention:  -D/c calorie count -Continue MVI with minerals daily -Continue Ensure Enlive po daily, each supplement  provides 350 kcal and 20 grams of protein -Continue Hormel shake daily, each supplement provides 520 kcals and 22 grams protein -RD will continue to personally obtain meal orders per pt preferences in attempt to maximize intake -Continue whole milk with all meals -Continue manager's check to meal trays to assist with tray accuracy   Khush Pasion A. Mayford Knife, RD, LDN, CDE Pager: [phone] After hours Pager: [phone]
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
## 2018-01-17 NOTE — Progress Notes (Signed)
Pt refuses in his stomach for his Lovenox inj, pt has not enough fat layer in his thigh to give safely since he refuses in his stomach, please address, thanks Lavonda Jumbo RN.

## 2018-01-17 NOTE — Progress Notes (Signed)
Patient refused bed alarm. Son at bed side . Will continue to monitor patient.

## 2018-01-18 LAB — BLOOD GAS, ARTERIAL
Acid-base deficit: 3.1 mmol/L — ABNORMAL HIGH (ref 0.0–2.0)
Bicarbonate: 19.6 mmol/L — ABNORMAL LOW (ref 20.0–28.0)
Drawn by: 331761
O2 Content: 2 L/min
O2 Saturation: 92.8 %
Patient temperature: 98
pCO2 arterial: 24.8 mmHg — ABNORMAL LOW (ref 32.0–48.0)
pH, Arterial: 7.507 — ABNORMAL HIGH (ref 7.350–7.450)
pO2, Arterial: 61.4 mmHg — ABNORMAL LOW (ref 83.0–108.0)

## 2018-01-18 MED ORDER — LIDOCAINE VISCOUS HCL 2 % MT SOLN
15.0000 mL | OROMUCOSAL | Status: DC
  Administered 2018-01-18 – 2018-01-19 (×7): 15 mL via OROMUCOSAL
  Filled 2018-01-18 (×9): qty 15
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
## 2018-01-18 NOTE — Progress Notes (Signed)
MD aware of pt not wanting to be discharged MD stated he will discharge pt tomorrow    Update provided to pt son

## 2018-01-18 NOTE — Discharge Instructions (Addendum)
Dysphagia Diet Level 1, Pureed The dysphasia level 1 diet includes foods that are completely pureed and smooth. The foods have a pudding-like texture, such as the texture of pureed pancakes, mashed potatoes, and yogurt. The diet does not include foods with lumps or coarse textures. Liquids should be smooth and may either be thin, nectar-thick, honey-like, or spoon-thick. This diet is helpful for people with moderate to severe swallowing problems. It reduces the risk of food getting caught in the windpipe, trachea, or lungs. You may need help or supervision during meals while following this diet. What do I need to know about this diet? Foods  You may eat foods that are soft and have a pudding-like texture. If a food does not have this texture, you may be able to eat the food after: ? Pureeing it. This can be done with a blender or whisk. ? Moistening it with liquid. For example, you may have bread if you soak it in milk or syrup.  Avoid foods that are hard, dry, sticky, chunky, lumpy, or stringy. Also avoid foods with nuts, seeds, raisins, skins, and pulp.  Do not eat foods that you have to chew. If you have to chew the food, then you cannot eat it.  Eat a variety of foods to get all the nutrients you need. Liquids  You may drink liquids that are smooth. Your health care provider will tell you if you should drink thin or thickened liquids.  To thicken a liquid, use a food and beverage thickener or a thickening food. Thickened liquids are usually a pudding-like consistency.  Thin liquids include fruit juices, milk, coffee, tea, yogurts, shakes, and similar foods that melt to thin liquid at room temperature.  Avoid liquids with seeds, pulp, or chunks. See your dietitian or health care provider regularly for help with your dietary changes. What foods can I eat? Grains Store-bought soft breads, pancakes, and [place] toast that have a smooth, moist texture and do not have nuts or seeds (you  will need to moisten the food with liquid). Cooked cereals that have a pudding-like consistency, such as cream of wheat or farina (no oatmeal). Pureed, well-cooked pasta, rice, and plain bread stuffing. Vegetables Pureed vegetables. Soft avocado. Smooth tomato paste or sauce. Strained or pureed soups (these may need to be thickened as directed). Mashed or pureed potatoes without skin (can be seasoned with butter, smooth gravy, margarine, or sour cream). Fruits Pureed fruits such as melons and apples without seeds or pulp. Mashed bananas. Smooth tomato paste or sauce. Fruit juices without pulp or seeds. Strained or pureed soups. Meat and Other Protein Sources Pureed meat. Smooth pate or liverwurst. Smooth souffles. Pureed beans (such as lentils). Pureed eggs. Dairy Yogurt. Smooth cheese sauces. Milk (may need to be thickened). Nutritional dairy drinks or shakes. Ask your health care provider whether you can have ice cream. Condiments Finely ground salt, pepper, and other ground spices. Sweets/Desserts Smooth puddings and custards. Pureed desserts. Souffles. Whipped topping. Ask your health care provider whether you can have frozen desserts. Fats and Oils Butter. Margarine. Smooth and strained gravy. Sour cream. Mayonnaise. Cream cheese. Whipped topping. Smooth sauces (such as white sauce, cheese sauce, or hollandaise sauce). The items listed above may not be a complete list of recommended foods or beverages. Contact your dietitian for more options. What foods are not recommended? Grains Oatmeal. Dry cereals. Hard breads. Vegetables Whole vegetables. Stringy vegetables (such as celery). Thin tomato sauce. Fruits Whole fresh, frozen, canned, or dried fruits that have  not been pureed. Stringy fruits (such as pineapple). Meat and Other Protein Sources Whole or ground meat, fish, or poultry. Dried or cooked lentils or legumes that have been cooked but not mashed or pureed. Non-pureed eggs. Nuts and  seeds. Peanut butter. Dairy Non-pureed cheese. Dairy products with lumps or chunks. Ask your health care provider whether you can have ice cream. Condiments Coarse or seeded herbs and spices. Sweets/Desserts [place] preserves. Jams with seeds. Solid desserts. Sticky, chewy sweets (such as licorice and caramel). Ask your health care provider whether you can have frozen desserts. Fats and Oils Sauces of fats with lumps or chunks. The items listed above may not be a complete list of foods and beverages to avoid. Contact your dietitian for more information. This information is not intended to replace advice given to you by your health care provider. Make sure you discuss any questions you have with your health care provider. Document Released: 06/12/2005 Document Revised: 11/18/2015 Document Reviewed: 05/26/2013 Elsevier Interactive Patient Education  2017 Elsevier Inc.  Nutrition Post [facility] Stay Proper nutrition can help your body recover from illness and injury.   Foods and beverages high in protein, vitamins, and minerals help rebuild muscle loss, promote healing, & reduce fall risk.   In addition to eating healthy foods, a nutrition shake is an easy, delicious way to get the nutrition you need during and after your hospital stay  It is recommended that you continue to drink 2 bottles per day of:       Ensure Plus, or equivalent for at least 1 month (30 days) after your hospital stay   Tips for adding a nutrition shake into your routine: As allowed, drink one with vitamins or medications instead of water or juice Enjoy one as a tasty mid-morning or afternoon snack Drink cold or make a milkshake out of it Drink one instead of milk with cereal or snacks Use as a coffee creamer   Available at the following grocery stores and pharmacies:           * Karin Golden * [employer] * [employer]co  * Rite Aid          * Walmart * [employer] Club  * Walgreens      * Target  * BJ's   * CVS  * Lowes  Foods   * [facility] [phone]            For COUPONS visit: www.ensure.com/join or RoleLink.com.br   Suggested Substitutions Ensure Plus = Boost Plus = Carnation Breakfast Essentials = Boost Compact Ensure Active Clear = Boost Breeze Glucerna Shake = Boost Glucose Control = Carnation Breakfast Essentials SUGAR FREE

## 2018-01-18 NOTE — Progress Notes (Signed)
Nutrition Follow-up  DOCUMENTATION CODES:   Severe malnutrition in context of chronic illness, Underweight  INTERVENTION:   -Continue MVI with minerals daily -Continue Ensure Enlive po daily, each supplement provides 350 kcal and 20 grams of protein -Increase Hormel shake to TID, each supplement provides 520 kcals and 22 grams protein -RD will continue to personally obtain meal orders per pt preferences in attempt to maximize intake (meal orders have been ordered throughout the weekend in the event that pt does not discharge home today) -Continue whole milk with all meals -Continue manager's check to meal trays to assist with tray accuracy  NUTRITION DIAGNOSIS:   Severe Malnutrition related to chronic illness as evidenced by moderate fat depletion, severe fat depletion, severe muscle depletion, percent weight loss(10.2% weight loss in 3 months).  Ongoing  GOAL:   Patient will meet greater than or equal to 90% of their needs  Progressing  MONITOR:   PO intake, Supplement acceptance, Weight trends, I & O's, Labs, Skin  REASON FOR ASSESSMENT:   Consult Assessment of nutrition requirement/status  ASSESSMENT:   72 year old male who presented to the ED with chest pain. Pt was recently admitted and diagnosed with a STEMI of the LAD. Pt had a stent placed and was discharged home. PMH significant for hyperlipidemia, hypertension, ICM, tobacco use, and noncompliance.  7/21- s/p esophagram, which reveal esophageal dysmotility per GI notes  Case discussed with RN and CSW. Pt will likely discharge home today. Discussed with RN regarding pt's nutritional status. Pt continues to be a very selective eater. RN has tried multiple supplements, and will mainly drink Hormel Shakes.   Calorie count was competed; pt was meeting 88% of protein needs per results. Noted on lunch tray on 01/17/18- pt consumed only bites and sips of mashed potatoes, meat, and vegetables and 1.5 cartons of whole milk,  as well as a Hormel Shake. RD has personally been obtaining meal orders and entering them into meal ordering system over the past week in attempt to optimize meal intake. RD recommended feeing tube, however, pt is refusing this intervention.   RN requesting pt receive Hormel shakes more frequently; RD will adjust orders.   RD also added "[facility] Stay" and "Pureed Diet" to discharge instructions to help optimize nutritional intake at discharge.   Labs reviewed: K: 3.4, Phos: 2.2, MG WDL.   Diet Order:   Diet Order           DIET - DYS 1 Room service appropriate? Yes; Fluid consistency: Thin  Diet effective now          EDUCATION NEEDS:   No education needs have been identified at this time  Skin:  Skin Assessment: Reviewed RN Assessment  Last BM:  01/17/18  Height:   Ht Readings from Last 1 Encounters:  01/09/18 5\' 10"  (1.778 m)    Weight:   Wt Readings from Last 1 Encounters:  01/18/18 123 lb 0.3 oz (55.8 kg)    Ideal Body Weight:  74.45 kg  BMI:  Body mass index is 17.65 kg/m.  Estimated Nutritional Needs:   Kcal:  1700-1900 kcal/day  Protein:  85-100 grams/day  Fluid:  >/= 1.7 L/day    Eniola Cerullo A. Mayford Knife, RD, LDN, CDE Pager: [phone] After hours Pager: [phone]

## 2018-01-18 NOTE — Progress Notes (Signed)
SATURATION QUALIFICATIONS: (This note is used to comply with regulatory documentation for home oxygen)  Patient Saturations on Room Air at Rest = 100%  Patient Saturations on Room Air while Ambulating = 83%  Patient Saturations on 2 Liters of oxygen while Ambulating = 99%  Please briefly explain why patient needs home oxygen: shortness of breath, pt needs to stop and rest while changing positions

## 2018-01-18 NOTE — Progress Notes (Signed)
Ordered pt breakfast per request

## 2018-01-18 NOTE — Progress Notes (Signed)
Brief Nutrition Note  RD received another consult for assessment of nutritional needs/status. RD evaluated pt earlier today and discussed plan with RN. See RD note from today for further details.   RD will continue to follow. Plan for discharge tomorrow.   Saylah Ketner A. Mayford Knife, RD, LDN, CDE Pager: [phone] After hours Pager: [phone]

## 2018-01-18 NOTE — Progress Notes (Signed)
Physical Therapy Treatment Patient Details Name: Ray Smith MRN: [id number] DOB: September 23, 1945 Today's Date: 01/18/2018    History of Present Illness Pt is a 72 y.o. admitted with chest pain and RBBB, fall on the floor for 2 days with mouth pain. PMHx: HLD, HTN, ICM, tobacco use, COPD, CKD stage 2, and 7/6 STEMI of the LAD s/p PCI    PT Comments    Pt pleasant and willing to mobilize today. Pt would only stand in his [place] but was able to stand, pivot and walk with RW more than he has done since admission. Pt and son state he hasn't really walked in the last month and has been pivoting at home. Pt encouraged to continue progressing mobility with staff for safe D/C and pt educated for HEP. Will continue to follow.   BP supine 114/87, HR 101-130, unable to obtain SpO2 at any time Pt on RA throughout session     Follow Up Recommendations  SNF;Supervision/Assistance - 24 hour     Equipment Recommendations  None recommended by PT    Recommendations for Other Services       Precautions / Restrictions Precautions Precautions: Fall Restrictions Weight Bearing Restrictions: No    Mobility  Bed Mobility Overal bed mobility: Modified Independent                Transfers Overall transfer level: Needs assistance   Transfers: Sit to/from Stand;Stand Pivot Transfers Sit to Stand: Min guard Stand pivot transfers: Min guard       General transfer comment: cues for hand placement from bed and BSC, guarding for stability without AD from bed to [facility] 2' away  Ambulation/Gait Ambulation/Gait assistance: Min guard Gait Distance (Feet): 100 Feet Assistive device: Rolling walker (2 wheeled) Gait Pattern/deviations: Step-through pattern;Decreased stride length;Trunk flexed   Gait velocity interpretation: >2.62 ft/sec, indicative of community ambulatory General Gait Details: cues for posture and position in RW with pt limited by fatigue. Unable to obtain pulse ox reading  throughout session with multiple probes. Pt with HR up to 130 with gait   Stairs             Wheelchair Mobility    Modified Rankin (Stroke Patients Only)       Balance Overall balance assessment: Needs assistance   Sitting balance-Leahy Scale: Good       Standing balance-Leahy Scale: Fair Standing balance comment: able to stand without RW, improved balance with rW use                            Cognition Arousal/Alertness: Awake/alert Behavior During Therapy: WFL for tasks assessed/performed Overall Cognitive Status: Impaired/Different from baseline Area of Impairment: Safety/judgement                         Safety/Judgement: Decreased awareness of deficits            Exercises General Exercises - Lower Extremity Long Arc Quad: AROM;15 reps;Seated;Both    General Comments        Pertinent Vitals/Pain Pain Assessment: No/denies pain    Home Living                      Prior Function            PT Goals (current goals can now be found in the care plan section) Progress towards PT goals: Goals met and updated - see care  plan    Frequency           PT Plan Current plan remains appropriate    Co-evaluation              AM-PAC PT "6 Clicks" Daily Activity  Outcome Measure  Difficulty turning over in bed (including adjusting bedclothes, sheets and blankets)?: None Difficulty moving from lying on back to sitting on the side of the bed? : None Difficulty sitting down on and standing up from a chair with arms (e.g., wheelchair, bedside commode, etc,.)?: A Little Help needed moving to and from a bed to chair (including a wheelchair)?: A Little Help needed walking in hospital room?: A Little Help needed climbing 3-5 steps with a railing? : A Lot 6 Click Score: 19    End of Session Equipment Utilized During Treatment: Gait belt Activity Tolerance: Patient tolerated treatment well Patient left: in  chair;with call bell/phone within reach;with chair alarm set Nurse Communication: Mobility status PT Visit Diagnosis: Muscle weakness (generalized) (M62.81);History of falling (Z91.81);Difficulty in walking, not elsewhere classified (R26.2)     Time: 4712-5271 PT Time Calculation (min) (ACUTE ONLY): 32 min  Charges:  $Gait Training: 8-22 mins $Therapeutic Activity: 8-22 mins                     [address], [place]    [place] 01/18/2018, 10:52 AM

## 2018-01-18 NOTE — Progress Notes (Signed)
Case manager aware of pt needing home oxygen set up  Pt states he will appeal discharge  CM aware, went to bedside to discuss

## 2018-01-19 MED ORDER — LIDOCAINE VISCOUS HCL 2 % MT SOLN: 15.0000 mL | OROMUCOSAL | 0 refills | Status: AC | PRN

## 2018-01-19 MED ORDER — ZINC SULFATE 220 (50 ZN) MG PO CAPS: 220.0000 mg | ORAL_CAPSULE | Freq: Every day | ORAL | 0 refills | Status: DC

## 2018-01-19 MED ORDER — SUCRALFATE 1 GM/10ML PO SUSP: 1.0000 g | Freq: Three times a day (TID) | ORAL | 0 refills | Status: DC

## 2018-01-19 MED ORDER — FOLIC ACID 1 MG PO TABS: 1.0000 mg | ORAL_TABLET | Freq: Every day | ORAL | 0 refills | Status: DC

## 2018-01-19 MED ORDER — NYSTATIN 100000 UNIT/ML MT SUSP: 5.0000 mL | Freq: Four times a day (QID) | OROMUCOSAL | 0 refills | Status: AC

## 2018-01-19 MED ORDER — ENSURE ENLIVE PO LIQD: 237.0000 mL | Freq: Every day | ORAL | 12 refills | Status: DC

## 2018-01-19 MED ORDER — ACYCLOVIR 400 MG PO TABS: 400.0000 mg | ORAL_TABLET | Freq: Three times a day (TID) | ORAL | 0 refills | Status: AC

## 2018-01-19 MED ORDER — ASCORBIC ACID 500 MG PO TABS: 500.0000 mg | ORAL_TABLET | Freq: Every day | ORAL | 0 refills | Status: DC

## 2018-01-19 NOTE — Progress Notes (Signed)
Called pharmacy, pharmacist stated okay to give pt 1800 scheduled medications now

## 2018-01-19 NOTE — Progress Notes (Signed)
Spoke w patient. Referral made to [facility] for home oxygen. They will deliver tank for transport home to room today. He .states that he will call his niece for a ride home. Staff updated.

## 2018-01-19 NOTE — Progress Notes (Signed)
Oxygen delivered to pt Pt has all belongings including two oxygen tanks. Pt discharged via wheelchair with NT

## 2018-01-19 NOTE — Discharge Summary (Signed)
Physician Discharge Summary  Patient ID: Ray Smith MRN: [id number] DOB/AGE: [id number] y.o.  Admit date: 01/09/2018 Discharge date: 01/19/2018  Admission Diagnoses:  Discharge Diagnoses:  Principal Problem:   Adjustment disorder with mixed disturbance of emotions and conduct Active Problems:   Coronary Artery Disease   COPD (chronic obstructive pulmonary disease) (HCC)   Dyslipidemia   Hypertension   Orthostatic hypotension   Weakness   Mouth pain   CKD (chronic kidney disease) stage 2, GFR 60-89 ml/min   Marijuana abuse   Protein-calorie malnutrition, severe   Discharged Condition: stable  Hospital Course: Patient is a 72 year old male with past medical history significant for recent ST segment elevation MI involving LAD status post PCI (admitted to [facility] on 12/29/2017 and discharged on 01/02/2018), congestive heart failure (systolic), coronary artery disease, nephrolithiasis, hyperlipidemia, hypertension, tobacco abuse and marijuana abuse.  Patient was recently discharged from the hospital following management for acute MI.  Patient was admitted with odynophagia, blisters inside of the mouth with associated pain, weakness and malnutrition.  There were also concerns for patient's home situation.  Patient was admitted and managed for herpetic gingivostomatitis, weakness and malnutrition.  Patient's chronic medical problems were also managed expectantly during the hospital stay.   Weakness  Severe Malnutrition: Patient was recently discharged from the hospital following STEMI.  On discharge, patient developed soreness of the night and pain on swallowing.  With the discomfort, patient's p.o. intake decreased significantly.  Patient was awakened significantly malnourished on presentation.  With control of pain in her paretic symptoms, patient's p.o. intake improved.  GI team was consulted to assist in patient's management.  Patient has improved significantly.  Physical  therapy was consulted to assess patient prior to discharge.  Skilled nursing facility was recommended but patient refused.  Patient also refused all help offered prior to discharge.  As per patient's wish and insistence, patient will be discharged back home.  Social services/APS was consulted to assist with the patient's discharge as there were concerns with patient's home situation.  Patient was also seen by the psychiatry team as there were concerns for possible homicidal statements.  As per psychiatric documentation, patient denied having any intent of harming anyone, specifically, his son.  Management of this was deferred to his psychiatric team and Adult Protective Services.  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  Patient was treated with acyclovir, and will be continued on discharge.  HIV screening test came back negative.  Vitamin B12 and folate were within normal range.  Vitamin C level was within normal range.  His interval was low.  Patient was also managed supportively.  Patient will be discharged on lidocaine swish and spit, as well.     CAD: Cardiology team was consulted as there were concerns for possible noncompliance with Plavix.  Patient was reloaded.  Management of recent STEMI was directed by the cardiology team.  Patient remained chest pain-free.  Hypertension: Coreg and spironolactone was continued.  Hyperlipidemia:  High-dose Lipitor was continued.  COPD: Patient was intermittently on supplemental oxygen during the hospital stay.  Patient's primary care provider will continue to assess patient, monitor patient closely and determine if there is need for continued supplemental oxygen.     Hypokalemia:  Continue to monitor and replete.    Marijuana abuse Encouraged cessation  Consults: cardiology, and gastroenterology   Discharge Exam: Blood pressure 112/85, pulse 95, temperature (!) 97.4 F (36.3 C),  temperature source Oral, resp. rate  20, height 5\' 10"  (1.778 m), weight 55.8 kg (123 lb 0.3 oz), SpO2 100 %.   Disposition: Discharge disposition: 01-Home or Self Care  Discharge Instructions    Call MD for:   Complete by:  As directed    Please call MD if symptoms worsen   Diet - low sodium heart healthy   Complete by:  As directed    Increase activity slowly   Complete by:  As directed      Allergies as of 01/19/2018      Reactions   Tramadol Anaphylaxis   Flexeril [cyclobenzaprine] Other (See Comments)   Per patient "it made my heart stop"   Ibuprofen Other (See Comments)   Overuse damages pt's kidneys   Lactose Intolerance (gi) Other (See Comments)   Digestive issues      Medication List    TAKE these medications   acyclovir 400 MG tablet Commonly known as:  ZOVIRAX Take 1 tablet (400 mg total) by mouth 3 (three) times daily for 4 days.   ascorbic acid 500 MG tablet Commonly known as:  VITAMIN C Take 1 tablet (500 mg total) by mouth daily. Start taking on:  01/20/2018   aspirin 81 MG EC tablet Take 1 tablet (81 mg total) by mouth daily.   atorvastatin 80 MG tablet Commonly known as:  LIPITOR Take 1 tablet (80 mg total) by mouth daily at 6 PM.   carvedilol 3.125 MG tablet Commonly known as:  COREG Take 1 tablet (3.125 mg total) by mouth 2 (two) times daily with a meal.   clopidogrel 75 MG tablet Commonly known as:  PLAVIX Take 1 tablet (75 mg total) by mouth daily.   feeding supplement (ENSURE ENLIVE) Liqd Take 237 mLs by mouth at bedtime.   folic acid 1 MG tablet Commonly known as:  FOLVITE Take 1 tablet (1 mg total) by mouth daily. Start taking on:  01/20/2018   lidocaine 2 % solution Commonly known as:  XYLOCAINE Use as directed 15 mLs in the mouth or throat every 4 (four) hours as needed for up to 10 days for mouth pain.   nitroGLYCERIN 0.4 MG SL tablet Commonly known as:  NITROSTAT Place 1 tablet (0.4 mg total) under the tongue every 5 (five)  minutes x 3 doses as needed for chest pain.   nystatin 100000 UNIT/ML suspension Commonly known as:  MYCOSTATIN Take 5 mLs (500,000 Units total) by mouth 4 (four) times daily for 7 days.   spironolactone 25 MG tablet Commonly known as:  ALDACTONE Take 0.5 tablets (12.5 mg total) by mouth daily.   sucralfate 1 GM/10ML suspension Commonly known as:  CARAFATE Take 10 mLs (1 g total) by mouth 4 (four) times daily -  with meals and at bedtime.   zinc sulfate 220 (50 Zn) MG capsule Take 1 capsule (220 mg total) by mouth daily. Start taking on:  01/20/2018      33 minutes spent discharging this patient.  SignedBarnetta Chapel 01/19/2018, 2:16 PM

## 2018-01-19 NOTE — Progress Notes (Signed)
Discharge education provided at bedside  Called case manager for ETA of oxygen, oxygen should be delivered shortly  Re educated pt on importance of oxygen, pt willing to wait 5 minutes

## 2018-01-19 NOTE — Progress Notes (Signed)
Pt IV and telemetry removed by NT  Pt states he does not want to wait for oxygen to be delivered. Pt states his problem is from the air vent in the room. Educated pt on de saturation on room air when exerting self. Pt still refused. Witness to conversation, [place] NT.  Educated pt that he cannot leave until his ride is here, pt is not steady on feet. Pt refused SNF and home health PT.

## 2018-01-19 NOTE — Progress Notes (Signed)
Called case manager, Eunice Blase, in regards to pt needing home oxygen set up and PTAR for transportation home per pt request

## 2018-01-19 NOTE — Progress Notes (Signed)
Called pt ride, [place], with pt consent  Educated on importance of oxygen  Pt family understanding and speaking to pt via phone

## 2018-01-19 NOTE — Progress Notes (Signed)
Patient refused AM weight. 

## 2018-03-18 ENCOUNTER — Emergency Department (HOSPITAL_COMMUNITY)
Admission: EM | Admit: 2018-03-18 | Discharge: 2018-03-19 | Disposition: A | Discharge status: Home / Self Care | Payer: Medicare Other | Attending: Emergency Medicine | Admitting: Emergency Medicine

## 2018-03-18 ENCOUNTER — Emergency Department (HOSPITAL_COMMUNITY): Payer: Medicare Other

## 2018-03-18 ENCOUNTER — Encounter (HOSPITAL_COMMUNITY): Payer: Self-pay

## 2018-03-18 ENCOUNTER — Other Ambulatory Visit: Payer: Self-pay

## 2018-03-18 DIAGNOSIS — S01111A Laceration without foreign body of right eyelid and periocular area, initial encounter: Secondary | ICD-10-CM | POA: Diagnosis not present

## 2018-03-18 DIAGNOSIS — S0990XA Unspecified injury of head, initial encounter: Secondary | ICD-10-CM

## 2018-03-18 DIAGNOSIS — I2510 Atherosclerotic heart disease of native coronary artery without angina pectoris: Secondary | ICD-10-CM | POA: Diagnosis not present

## 2018-03-18 DIAGNOSIS — Y999 Unspecified external cause status: Secondary | ICD-10-CM | POA: Diagnosis not present

## 2018-03-18 DIAGNOSIS — Y939 Activity, unspecified: Secondary | ICD-10-CM | POA: Diagnosis not present

## 2018-03-18 DIAGNOSIS — W208XXA Other cause of strike by thrown, projected or falling object, initial encounter: Secondary | ICD-10-CM | POA: Diagnosis not present

## 2018-03-18 DIAGNOSIS — Y92015 Private garage of single-family (private) house as the place of occurrence of the external cause: Secondary | ICD-10-CM | POA: Diagnosis not present

## 2018-03-18 DIAGNOSIS — J441 Chronic obstructive pulmonary disease with (acute) exacerbation: Secondary | ICD-10-CM | POA: Diagnosis not present

## 2018-03-18 DIAGNOSIS — Z7902 Long term (current) use of antithrombotics/antiplatelets: Secondary | ICD-10-CM | POA: Diagnosis not present

## 2018-03-18 LAB — CBC WITH DIFFERENTIAL/PLATELET
Abs Immature Granulocytes: 0 10*3/uL (ref 0.0–0.1)
BASOS ABS: 0.1 10*3/uL (ref 0.0–0.1)
BASOS PCT: 1 %
Eosinophils Absolute: 0.1 10*3/uL (ref 0.0–0.7)
Eosinophils Relative: 1 %
HCT: 37.4 % — ABNORMAL LOW (ref 39.0–52.0)
HEMOGLOBIN: 10.9 g/dL — AB (ref 13.0–17.0)
Immature Granulocytes: 0 %
LYMPHS PCT: 8 %
Lymphs Abs: 0.8 10*3/uL (ref 0.7–4.0)
MCH: 27.5 pg (ref 26.0–34.0)
MCHC: 29.1 g/dL — ABNORMAL LOW (ref 30.0–36.0)
MCV: 94.2 fL (ref 78.0–100.0)
Monocytes Absolute: 0.8 10*3/uL (ref 0.1–1.0)
Monocytes Relative: 8 %
Neutro Abs: 8 10*3/uL — ABNORMAL HIGH (ref 1.7–7.7)
Neutrophils Relative %: 82 %
PLATELETS: 319 10*3/uL (ref 150–400)
RBC: 3.97 MIL/uL — AB (ref 4.22–5.81)
RDW: 20 % — AB (ref 11.5–15.5)
WBC: 9.8 10*3/uL (ref 4.0–10.5)

## 2018-03-18 LAB — PROTIME-INR
INR: 0.98
Prothrombin Time: 12.9 seconds (ref 11.4–15.2)

## 2018-03-18 LAB — BASIC METABOLIC PANEL
Anion gap: 10 (ref 5–15)
BUN: 12 mg/dL (ref 8–23)
CALCIUM: 8.9 mg/dL (ref 8.9–10.3)
CHLORIDE: 109 mmol/L (ref 98–111)
CO2: 24 mmol/L (ref 22–32)
CREATININE: 0.88 mg/dL (ref 0.61–1.24)
Glucose, Bld: 113 mg/dL — ABNORMAL HIGH (ref 70–99)
Potassium: 4.4 mmol/L (ref 3.5–5.1)
SODIUM: 143 mmol/L (ref 135–145)

## 2018-03-18 LAB — I-STAT TROPONIN, ED: TROPONIN I, POC: 0.03 ng/mL (ref 0.00–0.08)

## 2018-03-18 MED ORDER — PREDNISONE 20 MG PO TABS
40.0000 mg | ORAL_TABLET | Freq: Once | ORAL | Status: AC
  Administered 2018-03-18: 40 mg via ORAL
  Filled 2018-03-18: qty 2

## 2018-03-18 MED ORDER — IPRATROPIUM-ALBUTEROL 0.5-2.5 (3) MG/3ML IN SOLN
3.0000 mL | Freq: Once | RESPIRATORY_TRACT | Status: AC
  Administered 2018-03-18: 3 mL via RESPIRATORY_TRACT
  Filled 2018-03-18: qty 3

## 2018-03-18 MED ORDER — DOXYCYCLINE HYCLATE 100 MG PO TABS
100.0000 mg | ORAL_TABLET | Freq: Once | ORAL | Status: AC
  Administered 2018-03-18: 100 mg via ORAL
  Filled 2018-03-18: qty 1

## 2018-03-18 MED ORDER — METHYLPREDNISOLONE SODIUM SUCC 125 MG IJ SOLR
125.0000 mg | Freq: Once | INTRAMUSCULAR | Status: DC
  Filled 2018-03-18: qty 2

## 2018-03-18 MED ORDER — LIDOCAINE HCL (PF) 1 % IJ SOLN
5.0000 mL | Freq: Once | INTRAMUSCULAR | Status: AC
  Administered 2018-03-18: 5 mL via INTRADERMAL
  Filled 2018-03-18: qty 5

## 2018-03-18 NOTE — ED Triage Notes (Signed)
Pt was getting into car to come to the hospital for short of breath and then fell and hit his head on the door.  Lac to the left eyebrow.  A&Ox4. Recently hospitalized for MI 3 months ago.

## 2018-03-18 NOTE — ED Provider Notes (Signed)
MSE was initiated and I personally evaluated the patient and placed orders (if any) at  6:51 PM on March 18, 2018.  The patient appears stable so that the remainder of the MSE may be completed by another provider.  Patient placed in Quick Look pathway, seen and evaluated   Chief Complaint: shortness of breath, fall  HPI:   72 year old male with a past medical history of COPD, CAD, presents for multiple complaints.  Patient states that for the past 2 weeks, he has been short of breath, some chest pain with coughing, cough productive with green mucus.  He was coming to the ED to be evaluated for the symptoms.  He was in his garage when the garage door hit him on the head, caused him to fall backwards and began feeling lightheaded.  Patient is currently anticoagulated on Plavix.  Denies any loss of consciousness, vision changes, hemoptysis, leg swelling.  Last tetanus was 3 years ago. States "my heart isn't bothering me, it's just this cough." Of note, patient admitted for STEMI in 12/2017.  ROS: SOB (one)  Physical Exam:   Gen: No distress  Neuro: Awake and Alert  Skin: Warm    Focused Exam: laceration noted in R eyebrow. No facial asymmetry noted. Coarse breath sounds bilateral lung fields. RRR.   Initiation of care has begun. The patient has been counseled on the process, plan, and necessity for staying for the completion/evaluation, and the remainder of the medical screening examination    Dietrich Pates, PA-C 03/18/18 1854    Linwood Dibbles, MD 03/18/18 2322

## 2018-03-18 NOTE — Progress Notes (Deleted)
Trach pt found on venti oxygen device that is used for transport   Changed out cotton dressing from previous facility that was coming apart.  Patient now has new trach  dressing and changed out #6XLT inner cannula (was due for change) suction patients trach with a 14 Fr cath small secretions yellowish..  Patient is on 28% ATC and seems to be more relaxed and in a better mood.  RT will continue to monitor.

## 2018-03-19 MED ORDER — DOXYCYCLINE HYCLATE 100 MG PO CAPS: 100.0000 mg | ORAL_CAPSULE | Freq: Two times a day (BID) | ORAL | 0 refills | Status: AC

## 2018-03-19 MED ORDER — PREDNISONE 10 MG PO TABS: 40.0000 mg | ORAL_TABLET | Freq: Every day | ORAL | 0 refills | Status: AC

## 2018-04-18 ENCOUNTER — Other Ambulatory Visit: Payer: Self-pay | Admitting: Cardiology

## 2018-05-08 ENCOUNTER — Other Ambulatory Visit: Payer: Self-pay | Admitting: Cardiology

## 2018-07-22 ENCOUNTER — Encounter (INDEPENDENT_AMBULATORY_CARE_PROVIDER_SITE_OTHER): Payer: Medicare Other | Admitting: Ophthalmology

## 2018-11-07 ENCOUNTER — Other Ambulatory Visit: Payer: Self-pay | Admitting: Cardiology

## 2018-11-13 ENCOUNTER — Other Ambulatory Visit: Payer: Self-pay | Admitting: Cardiology

## 2018-11-15 ENCOUNTER — Other Ambulatory Visit: Payer: Self-pay | Admitting: Cardiology

## 2018-11-15 ENCOUNTER — Telehealth: Payer: Self-pay

## 2018-11-15 NOTE — Telephone Encounter (Signed)
F/U Message           [facility], [facility] the [facility]ill pls call to advise  Patient states he is going to end up in the hospital if he don't get his refill (per the pharmacist).

## 2018-11-15 NOTE — Telephone Encounter (Signed)
Per refill request Ray Smith, CMA routed conversation to [facility] Refills 5 minutes ago (3:57 PM)    Ray Smith routed conversation to [facility] Triage; [facility] Cma 25 minutes ago (3:37 PM)    Ray Smith, Ray Smith 25 minutes ago (3:37 PM)     Smith/U Message [id number] [place] [place] - [address], [facility] the [facility] for his refill pls call to advise  Patient states he is going to end up in the hospital if he don't get his refill (per the pharmacist).     Doesn't look like we have ever seen this pt in the [facility] pharmacy s/wJocelyn, she states that Dr Lewie Loron sent in a refill. Tried to call pt, phone number on file, sounds like a teenager s answering, saying hello when I ask for the pt, he just hangs up. Make sure to get new phone number if pt calls back

## 2019-02-24 ENCOUNTER — Other Ambulatory Visit: Payer: Self-pay | Admitting: Cardiology

## 2019-03-04 IMAGING — RF DG ESOPHAGUS
13 series · 13 of 13 positions shown · non-contrast
Comparison: None.

CLINICAL DATA: Dysphagia. Unable to eat. Painful oral ulcers with
linear fissures on the tongue.

EXAM:
ESOPHOGRAM / BARIUM SWALLOW / BARIUM TABLET STUDY
TECHNIQUE: Combined double contrast and single contrast examination performed
using effervescent crystals, thick barium liquid, and thin barium
liquid. The patient was observed with fluoroscopy swallowing a 13 mm
barium sulphate tablet.
FLUOROSCOPY TIME:  Fluoroscopy Time:  2 minutes 42 seconds
Radiation Exposure Index (if provided by the fluoroscopic device):
597.96 uGy*m2
Number of Acquired Spot Images: 0

[Series 1: cp_standard · 0.26mm/px · 1 of 1 slices shown (1 of 13)]
[im 1/1]
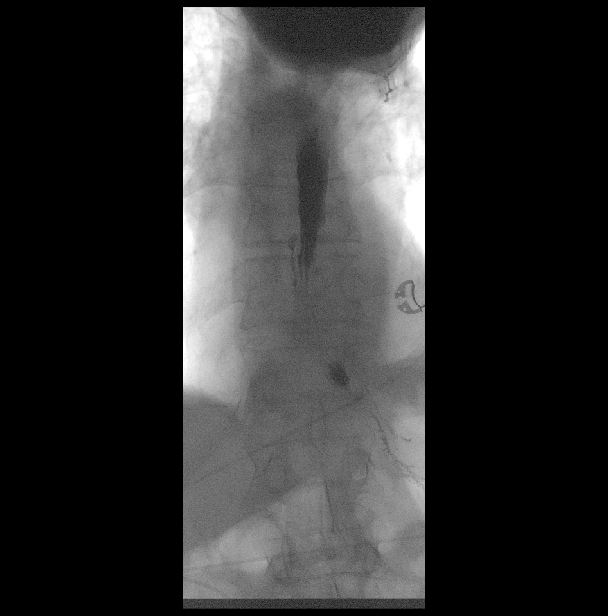

[Series 2: cp_standard · 0.26mm/px · 1 of 1 slices shown (2 of 13)]
[im 1/1]
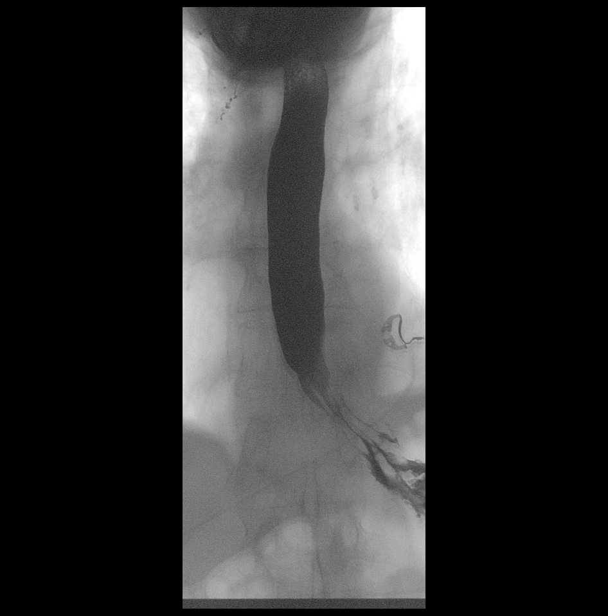

[Series 3: cp_standard · 0.26mm/px · 1 of 1 slices shown (3 of 13)]
[im 1/1]
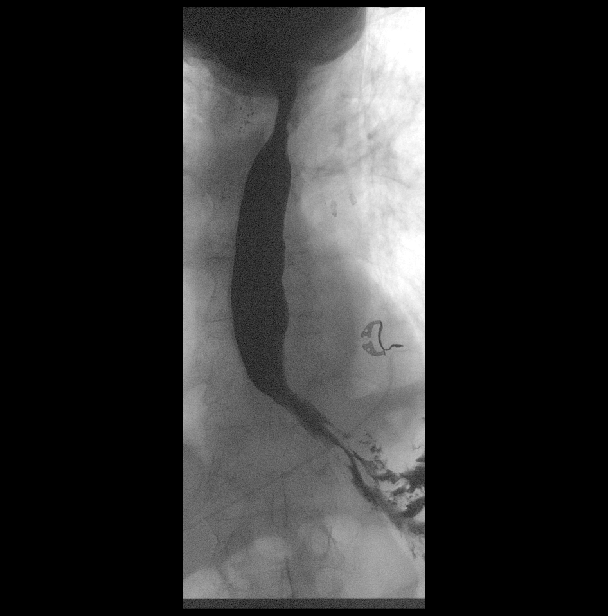

[Series 4: cp_standard · 0.26mm/px · 1 of 1 slices shown (4 of 13)]
[im 1/1]
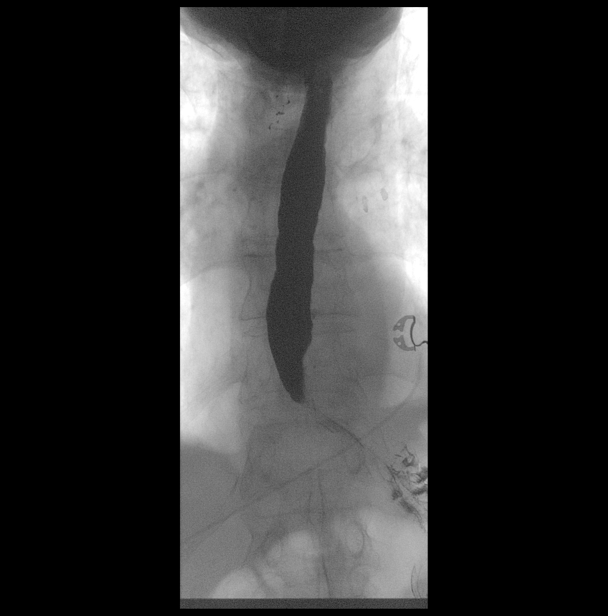

[Series 5: cp_standard · 0.26mm/px · 1 of 1 slices shown (5 of 13)]
[im 1/1]
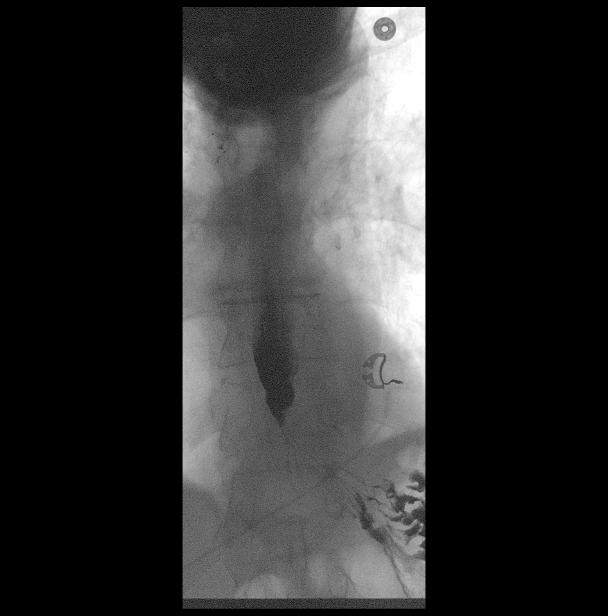

[Series 6: cp_standard · 0.26mm/px · 1 of 1 slices shown (6 of 13)]
[im 1/1]
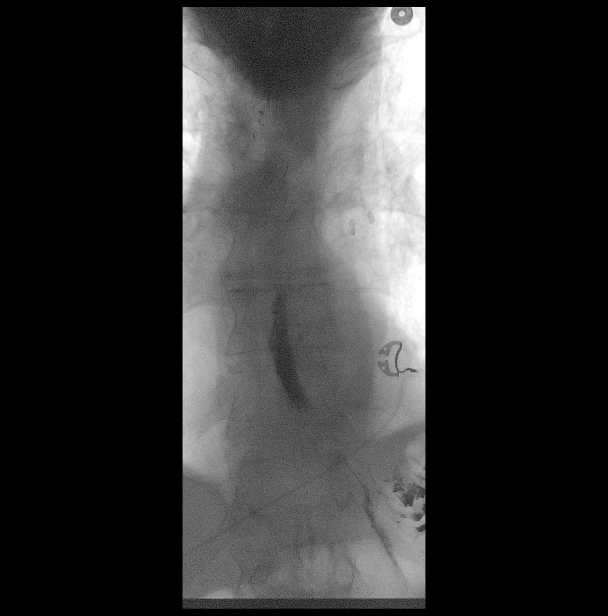

[Series 7: cp_standard · 0.26mm/px · 1 of 1 slices shown (7 of 13)]
[im 1/1]
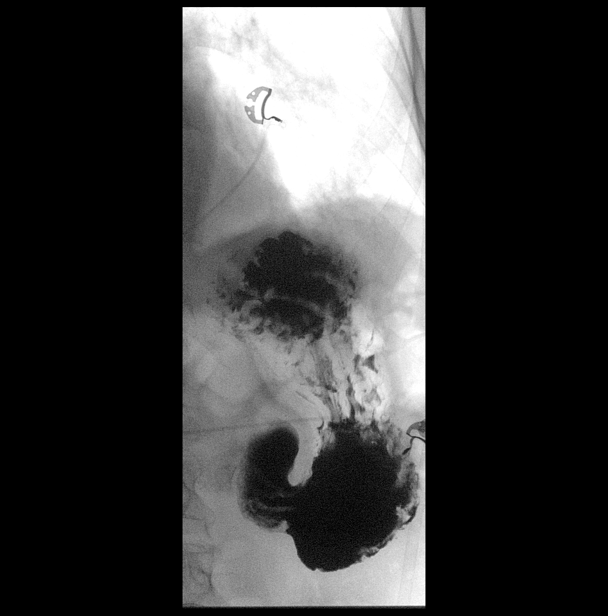

[Series 8: cp_standard · 0.26mm/px · 1 of 1 slices shown (8 of 13)]
[im 1/1]
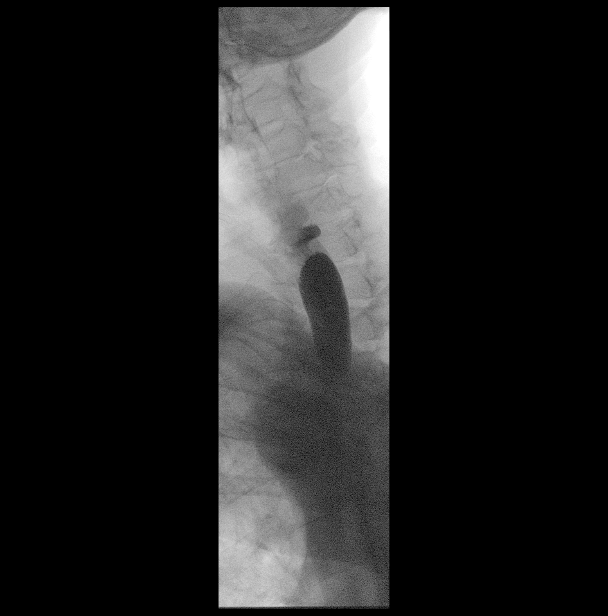

[Series 9: cp_standard · 0.26mm/px · 1 of 1 slices shown (9 of 13)]
[im 1/1]
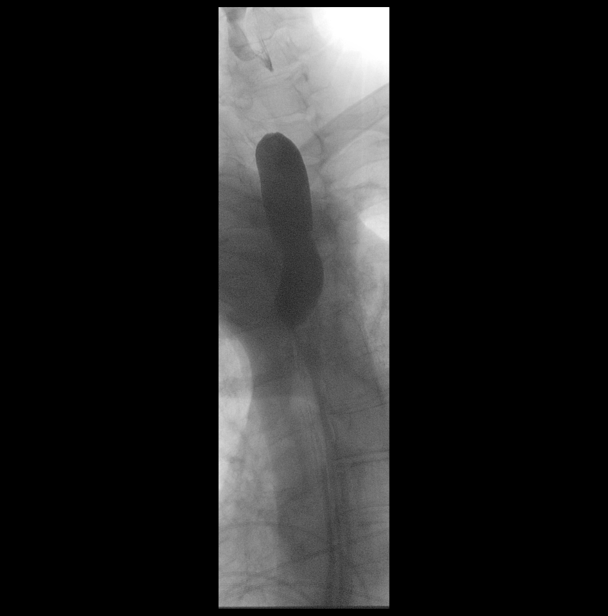

[Series 10: cp_standard · 0.26mm/px · 1 of 1 slices shown (10 of 13)]
[im 1/1]
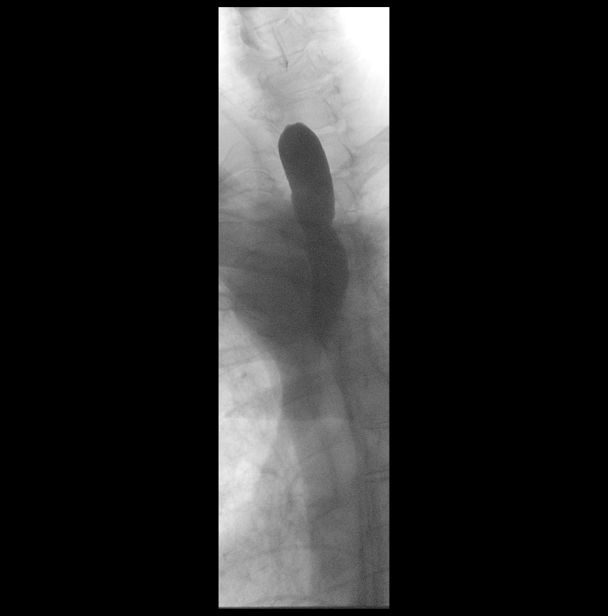

[Series 11: cp_standard · 0.26mm/px · 1 of 1 slices shown (11 of 13)]
[im 1/1]
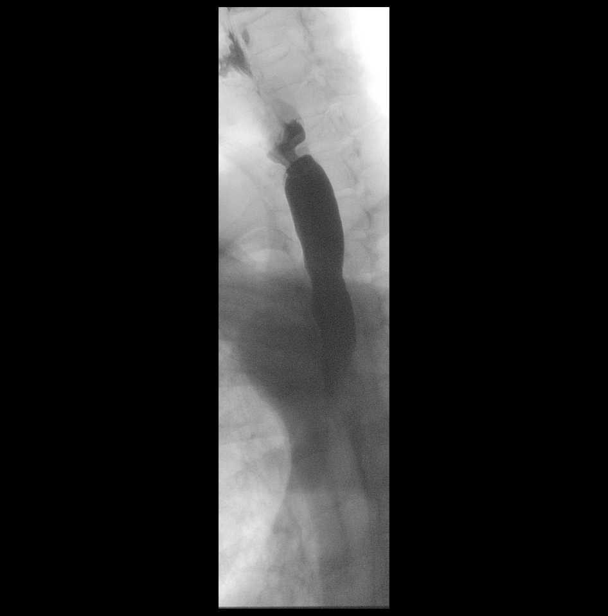

[Series 12: cp_standard · 0.26mm/px · 1 of 1 slices shown (12 of 13)]
[im 1/1]
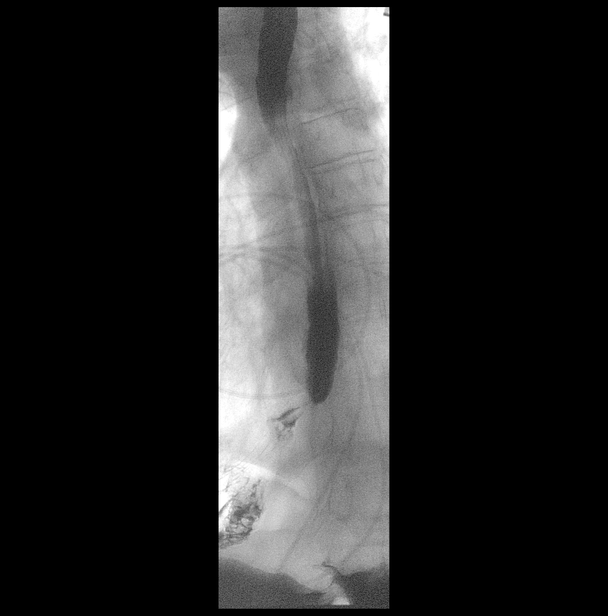

[Series 13: cp_standard · 0.26mm/px · 1 of 1 slices shown (13 of 13)]
[im 1/1]
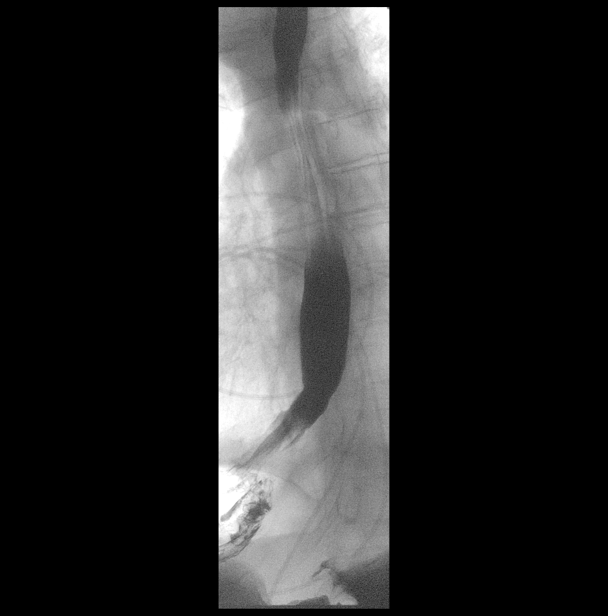

[13 of 13 positions shown; findings below may reference images not displayed]

FINDINGS: The patient swallowed barium without difficulty. Normal primary
esophageal peristalsis was demonstrated with significantly impaired
secondary peristalsis and intermittent mild diffuse tertiary
peristaltic activity.

A small sliding hiatal hernia was demonstrated. Also demonstrated
was hypertrophy of the cricopharyngeus muscle. No ulcerations,
masses or strictures were seen. No gastroesophageal reflux seen
during the examination. The patient swallowed a 12.5 mm in diameter
barium tablet without difficulty. This passed normally through the
esophagus and into the stomach.
IMPRESSION: 1. Small sliding hiatal hernia.
2. Hypertrophy of the cricopharyngeus muscle.
3. Significantly impaired secondary esophageal peristalsis.
4. Mild intermittent diffuse tertiary peristaltic activity
throughout the esophagus.
5. No ulceration, mass or stricture seen.

## 2019-03-05 ENCOUNTER — Other Ambulatory Visit: Payer: Self-pay | Admitting: Cardiology

## 2019-03-19 ENCOUNTER — Other Ambulatory Visit: Payer: Self-pay | Admitting: Interventional Cardiology

## 2019-03-19 ENCOUNTER — Telehealth: Payer: Medicare Other | Admitting: Physician Assistant

## 2019-03-19 MED ORDER — CLOPIDOGREL BISULFATE 75 MG PO TABS: 75.0000 mg | ORAL_TABLET | Freq: Every day | ORAL | 2 refills | Status: DC

## 2019-03-19 NOTE — Telephone Encounter (Signed)
New message    *STAT* If patient is at the pharmacy, call can be transferred to refill team.   1. Which medications need to be refilled? (please list name of each medication and dose if known) clopidogrel (PLAVIX) 75 MG tablet  2. Which pharmacy/location (including street and city if local pharmacy) is medication to be sent to?[id number] [place] [place] [address] [place].  3. Do they need a 30 day or 90 day supply? [place]

## 2019-03-19 NOTE — Telephone Encounter (Signed)
Pt has an upcoming appt with Ermalinda Barrios, PA on 04/02/19, pt has never been seen in our office before. Would you like to refill this medication Clopidogrel 75 mg tablet until appt on 04/02/19? Please address

## 2019-04-01 ENCOUNTER — Telehealth: Payer: Self-pay

## 2019-04-01 NOTE — Progress Notes (Deleted)
{Choose 1 Note Type (Telehealth Visit or Telephone Visit):[phone]}   Date:  04/01/2019   ID:  Ray Smith, DOB Mar 21, 1946, MRN [id number]  {Patient Location:[id number]::"Home"} {Provider Location:[phone]::"Home"}  PCP:  Patient, No Pcp Per  Cardiologist:  Lesleigh Noe, MD *** Electrophysiologist:  None   Evaluation Performed:  {Choose Visit [phone]::"Follow-Up Visit"}  Chief Complaint:  ***  History of Present Illness:    Ray Smith is a 73 y.o. male with ***  The patient {does/does not:200015} have symptoms concerning for COVID-19 infection (fever, chills, cough, or new shortness of breath).    Past Medical History:  Diagnosis Date  . Acute ST elevation myocardial infarction (STEMI) involving left anterior descending (LAD) coronary artery (HCC) 12/30/2017  . Acute systolic heart failure (HCC) 12/30/2017  . Coronary artery disease    a.  presented with CP and inf STE but no ACS - LHC (10/23/13):  Dist LM 40-60%, ostial LAD 80%, mid LAD 50%, ostial CFX 50-70%, mid RCA 50%.  EF 60%. - CP not felt to be ischemic; CABG vs Med Rx d/w pt - pt opted for Med Rx  . History of kidney stones    "more than 20" (01/09/2018)  . Hyperlipidemia   . Hypertension   . Marijuana abuse   . Tobacco dependence    Past Surgical History:  Procedure Laterality Date  . CORONARY/GRAFT ACUTE MI REVASCULARIZATION N/A 12/30/2017   Procedure: Coronary/Graft Acute MI Revascularization;  Surgeon: Tonny Bollman, MD;  Location: [facility] INVASIVE CV LAB;  Service: Cardiovascular;  Laterality: N/A;  Distal LM into LAD Xience [place] 3.5x106mm Ostial Cx Xience [place] 3.5x8mm  . LEFT HEART CATH AND CORONARY ANGIOGRAPHY N/A 12/30/2017   Procedure: LEFT HEART CATH AND CORONARY ANGIOGRAPHY;  Surgeon: Tonny Bollman, MD;  Location: [facility] INVASIVE CV LAB;  Service: Cardiovascular;  Laterality: N/A;  . LEFT HEART CATHETERIZATION WITH CORONARY ANGIOGRAM Bilateral 10/23/2013   Procedure: LEFT HEART  CATHETERIZATION WITH CORONARY ANGIOGRAM;  Surgeon: Lesleigh Noe, MD;  Location: [facility] CATH LAB;  Service: Cardiovascular;  Laterality: Bilateral;  . snake bite surgery    . TONSILLECTOMY  1955  . TOTAL KNEE ARTHROPLASTY  2001     No outpatient medications have been marked as taking for the 04/02/19 encounter (Appointment) with Dyann Kief, PA-C.     Allergies:   Tramadol, Flexeril [cyclobenzaprine], Ibuprofen, and Lactose intolerance (gi)   Social History   Tobacco Use  . Smoking status: Current Every Day Smoker    Packs/day: 6.00    Years: 40.00    Pack years: 240.00    Types: Cigarettes  . Smokeless tobacco: Never Used  Substance Use Topics  . Alcohol use: No  . Drug use: Yes    Types: Marijuana     Family Hx: The patient's family history includes Alzheimer's disease in his father; Diabetes in his maternal grandmother; Drug abuse in an other family member; Heart disease in his mother; Multiple sclerosis in his sister.  ROS:   Please see the history of present illness.    *** All other systems reviewed and are negative.   Prior CV studies:   The following studies were reviewed today:  ***  Labs/Other Tests and Data Reviewed:    EKG:  {EKG/Telemetry Strips [phone]}  Recent Labs: No results found for requested labs within last 8760 hours.   Recent Lipid Panel Lab Results  Component Value Date/Time   CHOL 185 12/30/2017 04:05 AM   TRIG 103 12/30/2017 04:05 AM  HDL 34 (L) 12/30/2017 04:05 AM   CHOLHDL 5.4 12/30/2017 04:05 AM   LDLCALC 130 (H) 12/30/2017 04:05 AM    Wt Readings from Last 3 Encounters:  03/18/18 120 lb (54.4 kg)  01/18/18 123 lb 0.3 oz (55.8 kg)  01/02/18 107 lb 12.9 oz (48.9 kg)     Objective:    Vital Signs:  There were no vitals taken for this visit.   {[facility] Virtual Exam (Optional):[phone]::"VITAL SIGNS:  reviewed"}  ASSESSMENT & PLAN:    1. ***  COVID-19 Education: The signs and symptoms of COVID-19  were discussed with the patient and how to seek care for testing (follow up with PCP or arrange E-visit).  ***The importance of social distancing was discussed today.  Time:   Today, I have spent *** minutes with the patient with telehealth technology discussing the above problems.     Medication Adjustments/Labs and Tests Ordered: Current medicines are reviewed at length with the patient today.  Concerns regarding medicines are outlined above.   Tests Ordered: No orders of the defined types were placed in this encounter.   Medication Changes: No orders of the defined types were placed in this encounter.   Follow Up:  {F/U [phone]} {follow up:15908}  Signed, Ermalinda Barrios, PA-C  04/01/2019 3:12 PM    [facility]

## 2019-04-01 NOTE — Telephone Encounter (Signed)
I tried to reach patient to go over medications and obtain consent for tomorrows virtual visit with Ermalinda Barrios. There was no answer and no voicemail to leave message.

## 2019-04-02 ENCOUNTER — Encounter: Payer: Medicare Other | Admitting: Physician Assistant

## 2019-04-02 ENCOUNTER — Other Ambulatory Visit: Payer: Self-pay

## 2019-04-02 NOTE — Progress Notes (Signed)
This encounter was created in error - please disregard.

## 2019-04-02 NOTE — Telephone Encounter (Signed)
Patient did not answer this morning for telehealth visit with Ermalinda Barrios, PA.

## 2019-04-06 NOTE — Progress Notes (Signed)
Virtual Visit via Telephone Note   This visit type was conducted due to national recommendations for restrictions regarding the COVID-19 Pandemic (e.g. social distancing) in an effort to limit this patient's exposure and mitigate transmission in our community.  Due to his co-morbid illnesses, this patient is at least at moderate risk for complications without adequate follow up.  This format is felt to be most appropriate for this patient at this time.  The patient did not have access to video technology/had technical difficulties with video requiring transitioning to audio format only (telephone).  All issues noted in this document were discussed and addressed.  No physical exam could be performed with this format.  Please refer to the patient's chart for his  consent to telehealth for [facility]   Date:  04/06/2019   ID:  Ray Smith, DOB Jul 15, 1945, MRN [id number]  Patient Location: Other:  Car Provider Location: Home  PCP:  Patient, No Pcp Per  Cardiologist:  Lesleigh Noe, MD  Electrophysiologist:  None   Evaluation Performed:  Follow-Up Visit  Chief Complaint: Post cath, ischemic cardiomyopathy follow-up, seen for Dr. Katrinka Blazing  History of Present Illness:    Ray Smith is a 73 y.o. male with CAD s/p PCI/DES to left main stem/proximal LAD 12/2017, acute systolic HF, tobacco use, HLD, and HTN.   He was initially seen by our service 12/2017 after presenting to [facility] as a STEMI. He underwent cardiac cath with Dr. Excell Seltzer with distal left main stenosis, total occlusion ostial LAD and severe stenosis ostial Circumflex. This was treated with stenting from the left main into the LAD and the Circumflex with an excellent result. Placed on DAPT with ASA/plavix indefinitely. He was also placed on high dose statin, and low dose BB. LV gram noted EF of <25%. Initially he refused to have follow up echo done, but did consent. Echo showed EF of 25-30%, akinesis of the anteroseptal, anterior,  anterolateral, lateral, inferolateral, and apical myocardium. No LV thrombus noted. During admission struggled with issues on non compliance and was difficult with the staff. LDL noted at 130. Trop peaked at >65 x2. PT was consulted but he refused. No further chest pain post cath. Considered lifevest, but given his medical noncompliance would not consider him a good candidate for therapy as he would not wear the vest.Compliance with DAPT Did reinforce the need to remain compliant with DAPT given his stents.   He has not been seen in follow up since this time.   Pt was then seen in the ED on 03/18/2018 for SOB and fall. He reported that for the previous 2 weeks he had chest pain with associated SOB, chest pain and coughing. He also reported that he was not going to come to the ED for those symptoms however his garage door hit him in the head causing him to fall backwards. He had no LOC or dizziness. Troponin was negative with no acute EKG changes. It appears that he was released with no further workup.   Today, he denies recurrent anginal symptoms.  States that since his cardiac cath he has had no anginal symptoms and is felt "great ".  He has not had any follow-up care with PCP or cardiology service in the post-cath setting from 12/2017.  He reports the only medications that he is continue to take as his ASA 81 mg and Plavix 75 mg.  He states that his prescription has run out on his spironolactone as well as his carvedilol  and statin therapies.  He continues to smoke cigarettes.  States that prior to his hospitalization, he had issues with what sounds like PAD which he reports has improved greatly over the last year.  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  He denies shortness of breath, PND, LE swelling, orthopnea, dizziness, diaphoresis or syncope.  He states he takes his BP daily in which his SBP runs in the 120s on a consistent basis.  We discussed the importance of consistent  follow-up given his cardiomyopathy and despite absence of symptoms, he will need to continue with recommended therapies if his goal is long-term normalization of LV function.   The patient does not have symptoms concerning for COVID-19 infection (fever, chills, cough, or new shortness of breath).   Past Medical History:  Diagnosis Date  . Acute ST elevation myocardial infarction (STEMI) involving left anterior descending (LAD) coronary artery ([place]) 12/30/2017  . Acute systolic heart failure ([place]) 12/30/2017  . Coronary artery disease    a.  presented with CP and inf STE but no ACS - LHC (10/23/13):  Dist LM 40-60%, ostial LAD 80%, mid LAD 50%, ostial CFX 50-70%, mid RCA 50%.  EF 60%. - CP not felt to be ischemic; CABG vs Med Rx d/w pt - pt opted for Med Rx  . History of kidney stones    "more than 20" (01/09/2018)  . Hyperlipidemia   . Hypertension   . Marijuana abuse   . Tobacco dependence    Past Surgical History:  Procedure Laterality Date  . CORONARY/GRAFT ACUTE MI REVASCULARIZATION N/A 12/30/2017   Procedure: Coronary/Graft Acute MI Revascularization;  Surgeon: Sherren Mocha, MD;  Location: [facility];  Service: Cardiovascular;  Laterality: N/A;  Distal LM into LAD Xience SIerra 3.5x81mm Ostial Cx [place] 3.5x69mm  . LEFT HEART CATH AND CORONARY ANGIOGRAPHY N/A 12/30/2017   Procedure: LEFT HEART CATH AND CORONARY ANGIOGRAPHY;  Surgeon: Sherren Mocha, MD;  Location: [facility];  Service: Cardiovascular;  Laterality: N/A;  . LEFT HEART CATHETERIZATION WITH CORONARY ANGIOGRAM Bilateral 10/23/2013   Procedure: LEFT HEART CATHETERIZATION WITH CORONARY ANGIOGRAM;  Surgeon: Sinclair Grooms, MD;  Location: [facility] C[facility];  Service: Cardiovascular;  Laterality: Bilateral;  . snake bite surgery    . TONSILLECTOMY  1955  . TOTAL KNEE ARTHROPLASTY  2001     No outpatient medications have been marked as taking for the 04/07/19 encounter (Appointment) with Tommie [name],  NP.     Allergies:   Tramadol, Flexeril [cyclobenzaprine], Ibuprofen, and Lactose intolerance (gi)   Social History   Tobacco Use  . Smoking status: Current Every Day Smoker    Packs/day: 6.00    Years: 40.00    Pack years: 240.00    Types: Cigarettes  . Smokeless tobacco: Never Used  Substance Use Topics  . Alcohol use: No  . Drug use: Yes    Types: Marijuana    Family Hx: The patient's family history includes Alzheimer's disease in his father; Diabetes in his maternal grandmother; Drug abuse in an other family member; Heart disease in his mother; Multiple sclerosis in his sister.  ROS:   Please see the history of present illness.     All other systems reviewed and are negative.  Prior CV studies:   The following studies were reviewed today: Cath: 12/30/17  Conclusion     Mid RCA lesion is 70% stenosed.  Mid LM to Dist LM lesion is 90% stenosed.  Ost LAD  to Prox LAD lesion is 100% stenosed.  Ost Cx to Prox Cx lesion is 90% stenosed.  Prox Cx to Mid Cx lesion is 50% stenosed.  Prox LAD to Mid LAD lesion is 40% stenosed.  Mid LAD lesion is 50% stenosed.  Ost 1st Diag to 1st Diag lesion is 40% stenosed.  A drug-eluting stent was successfully placed using a STENT SIERRA 3.50 X 33 MM.  Post intervention, there is a 0% residual stenosis.  Post intervention, there is a 0% residual stenosis.  A drug-eluting stent was successfully placed using a STENT SIERRA 3.50 X 23 MM.  Post intervention, there is a 0% residual stenosis.  There is severe left ventricular systolic dysfunction.  LV end diastolic pressure is normal.  The left ventricular ejection fraction is 25-35% by visual estimate.  1. Severe distal left main stenosis 2. Acute total occlusion of the proximal LAD 3. Severe ostial left circumflex stenosis 4. Moderate mid RCA stenosis 5. Severe segmental LV systolic dysfunction consistent with large anterolateral infarction with LVEF estimated at  25% 6. Successful complex PCI of the left mainstem/proximal LAD/proximal circumflex as outlined above  Recommend dual antiplatelet therapy with Aspirin 81mg  daily and Clopidogrel 75mg  dailylong-term (beyond 12 months) because of complex left main bifurcation PCI in the setting of anterior MI.   TTE: 01/01/18  Study Conclusions  - Left ventricle: Systolic function was severely reduced. The estimated ejection fraction was in the range of 25% to 30%. Akinesis of the anteroseptal, anterior, anterolateral, lateral, inferolateral, and apical myocardium. - Aortic valve: Valve mobility was mildly restricted. - Mitral valve: Calcified annulus. - Right ventricle: The cavity size was normal. Wall thickness was increased. - No evidence of LV thrombus with the use of eco contrast to define borders.  Impressions:  - Severely reduced LV ejection fraction with akinesis in multiple territories consistent with LAD and circumflex disease.  Labs/Other Tests and Data Reviewed:    EKG:  No ECG reviewed.  Recent Labs: No results found for requested labs within last 8760 hours.   Recent Lipid Panel Lab Results  Component Value Date/Time   CHOL 185 12/30/2017 04:05 AM   TRIG 103 12/30/2017 04:05 AM   HDL 34 (L) 12/30/2017 04:05 AM   CHOLHDL 5.4 12/30/2017 04:05 AM   LDLCALC 130 (H) 12/30/2017 04:05 AM    Wt Readings from Last 3 Encounters:  03/18/18 120 lb (54.4 kg)  01/18/18 123 lb 0.3 oz (55.8 kg)  01/02/18 107 lb 12.9 oz (48.9 kg)     Objective:    Vital Signs:  There were no vitals taken for this visit.   VITAL SIGNS:  reviewed GEN:  no acute distress RESPIRATORY:  normal respiratory effort NEURO:  alert and oriented x 3 PSYCH:  normal affect  ASSESSMENT & PLAN:    1. 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 Ischemic cardiomyopathy: -LVEF during hospitalization found to be 25-30%>>life vest discussed however due to non-compliance this was never ordered  -Lost to follow-up since hospital discharge 12/2017 -We will plan for repeat echocardiogram in office -Send for prescription for spironolactone 12.5 mg daily to be restarted as well as carvedilol  3.125 mg twice daily -Consider adding ACE/ARB is labs stable  -Last creatinine noted to be 03/18/2018, 0.88 however need more recent lab work to be completed  3. Tobacco Use: -Smoking cessation strongly encouraged -Continues to smoke  4. HLD: -Last LDL, 130 on 12/30/2017 -Reports noncompliance with statin therapy as he has been lost in follow-up since hospital discharge -We will repeat lipid panel on the same day as repeat echocardiogram -We will send prescription for atorvastatin 80 mg daily  5. HTN:  -Unable to obtain BP as visit was conducted in the patient's car -Reports daily home BPs in the 120s-70's -Has not been taking carvedilol secondary to noncompliance with follow-up -We will send prescription for carvedilol 3.125 mg twice daily   COVID-19 Education: The signs and symptoms of COVID-19 were discussed with the patient and how to seek care for testing (follow up with PCP or arrange E-visit).  The importance of social distancing was discussed today.  Time:   Today, I have spent 20 minutes with the patient with telehealth technology discussing the above problems.    Medication Adjustments/Labs and Tests Ordered: Current medicines are reviewed at length with the patient today.  Concerns regarding medicines are outlined above.   Tests Ordered: No orders of the defined types were placed in this encounter.    Medication Changes: No orders of the defined types were placed in this encounter.   Follow Up:  Either In Person or Virtual Visit Dr. Katrinka BlazingSmith in 2 months  Signed, Georgie ChardJill , NP  04/06/2019 3:21 PM    [facility]

## 2019-04-07 ENCOUNTER — Telehealth (INDEPENDENT_AMBULATORY_CARE_PROVIDER_SITE_OTHER): Payer: Medicare Other | Admitting: Cardiology

## 2019-04-07 ENCOUNTER — Other Ambulatory Visit: Payer: Self-pay

## 2019-04-07 DIAGNOSIS — I10 Essential (primary) hypertension: Secondary | ICD-10-CM

## 2019-04-07 DIAGNOSIS — I25118 Atherosclerotic heart disease of native coronary artery with other forms of angina pectoris: Secondary | ICD-10-CM | POA: Diagnosis not present

## 2019-04-07 DIAGNOSIS — Z720 Tobacco use: Secondary | ICD-10-CM

## 2019-04-07 DIAGNOSIS — E7841 Elevated Lipoprotein(a): Secondary | ICD-10-CM | POA: Diagnosis not present

## 2019-04-07 DIAGNOSIS — E785 Hyperlipidemia, unspecified: Secondary | ICD-10-CM

## 2019-04-07 DIAGNOSIS — I255 Ischemic cardiomyopathy: Secondary | ICD-10-CM | POA: Diagnosis not present

## 2019-04-07 MED ORDER — ASPIRIN 81 MG PO TBEC: 81.0000 mg | DELAYED_RELEASE_TABLET | Freq: Every day | ORAL | Status: DC

## 2019-04-07 MED ORDER — CLOPIDOGREL BISULFATE 75 MG PO TABS: 75.0000 mg | ORAL_TABLET | Freq: Every day | ORAL | 3 refills | Status: DC

## 2019-04-07 MED ORDER — SPIRONOLACTONE 25 MG PO TABS: 12.5000 mg | ORAL_TABLET | Freq: Every day | ORAL | 11 refills | Status: DC

## 2019-04-07 MED ORDER — CARVEDILOL 3.125 MG PO TABS: 3.1250 mg | ORAL_TABLET | Freq: Two times a day (BID) | ORAL | 11 refills | Status: DC

## 2019-04-07 NOTE — Patient Instructions (Addendum)
Medication Instructions:  Your physician recommends that you continue on your current medications as directed. Please refer to the Current Medication list given to you today.  If you need a refill on your cardiac medications before your next appointment, please call your pharmacy.   Lab work: TO BE DONE SAME DAY AS ECHO: CMET, CBC, LIPIDS If you have labs (blood work) drawn today and your tests are completely normal, you will receive your results only by: Marland Kitchen MyChart Message (if you have MyChart) OR . A paper copy in the mail If you have any lab test that is abnormal or we need to change your treatment, we will call you to review the results.  Testing/Procedures: Your physician has requested that you have an echocardiogram. Echocardiography is a painless test that uses sound waves to create images of your heart. It provides your doctor with information about the size and shape of your heart and how well your heart's chambers and valves are working. This procedure takes approximately one hour. There are no restrictions for this procedure.  Follow-Up: At [facility], you and your health needs are our priority.  As part of our continuing mission to provide you with exceptional heart care, we have created designated Provider Care Teams.  These Care Teams include your primary Cardiologist (physician) and Advanced Practice Providers (APPs -  Physician Assistants and Nurse Practitioners) who all work together to provide you with the care you need, when you need it. You will need a follow up appointment in 3 months.  Please call our office 2 months in advance to schedule this appointment.  You may see Sinclair Grooms, MD or one of the following Advanced Practice Providers on your designated Care Team:   Truitt Merle, NP Cecilie Kicks, NP . Kathyrn Drown, NP  Any Other Special Instructions Will Be Listed Below (If Applicable).   Echocardiogram An echocardiogram is a procedure that uses painless  sound waves (ultrasound) to produce an image of the heart. Images from an echocardiogram can provide important information about:  Signs of coronary artery disease (CAD).  Aneurysm detection. An aneurysm is a weak or damaged part of an artery wall that bulges out from the normal force of blood pumping through the body.  Heart size and shape. Changes in the size or shape of the heart can be associated with certain conditions, including heart failure, aneurysm, and CAD.  Heart muscle function.  Heart valve function.  Signs of a past heart attack.  Fluid buildup around the heart.  Thickening of the heart muscle.  A tumor or infectious growth around the heart valves. Tell a health care provider about:  Any allergies you have.  All medicines you are taking, including vitamins, herbs, eye drops, creams, and over-the-counter medicines.  Any blood disorders you have.  Any surgeries you have had.  Any medical conditions you have.  Whether you are pregnant or may be pregnant. What are the risks? Generally, this is a safe procedure. However, problems may occur, including:  Allergic reaction to dye (contrast) that may be used during the procedure. What happens before the procedure? No specific preparation is needed. You may eat and drink normally. What happens during the procedure?   An IV tube may be inserted into one of your veins.  You may receive contrast through this tube. A contrast is an injection that improves the quality of the pictures from your heart.  A gel will be applied to your chest.  A wand-like tool (  transducer) will be moved over your chest. The gel will help to transmit the sound waves from the transducer.  The sound waves will harmlessly bounce off of your heart to allow the heart images to be captured in real-time motion. The images will be recorded on a computer. The procedure may vary among health care providers and hospitals. What happens after the  procedure?  You may return to your normal, everyday life, including diet, activities, and medicines, unless your health care provider tells you not to do that. Summary  An echocardiogram is a procedure that uses painless sound waves (ultrasound) to produce an image of the heart.  Images from an echocardiogram can provide important information about the size and shape of your heart, heart muscle function, heart valve function, and fluid buildup around your heart.  You do not need to do anything to prepare before this procedure. You may eat and drink normally.  After the echocardiogram is completed, you may return to your normal, everyday life, unless your health care provider tells you not to do that. This information is not intended to replace advice given to you by your health care provider. Make sure you discuss any questions you have with your health care provider. Document Released: 06/09/2000 Document Revised: 10/03/2018 Document Reviewed: 07/15/2016 Elsevier Patient Education  2020 [employer].

## 2019-04-08 ENCOUNTER — Encounter (HOSPITAL_COMMUNITY): Payer: Self-pay | Admitting: Cardiology

## 2019-04-14 ENCOUNTER — Telehealth (HOSPITAL_COMMUNITY): Payer: Self-pay

## 2019-04-14 NOTE — Telephone Encounter (Signed)
New message    Call the patient to set up an echocardiogram ordered by Kathyrn Drown, APP.    The patient indicated he is feeling fine and does not want to set up an appointment.

## 2019-04-14 NOTE — Telephone Encounter (Signed)
I spoke to the patient who does not want to come in now for the Echo, which was recommended/ordered on 10/12.  He would prefer to wait for the CoVid to improve and then he will have it done.  He says that he "feels the best he has ever felt."

## 2019-09-02 ENCOUNTER — Emergency Department (HOSPITAL_COMMUNITY): Payer: Medicare Other

## 2019-09-02 ENCOUNTER — Other Ambulatory Visit: Payer: Self-pay

## 2019-09-02 ENCOUNTER — Inpatient Hospital Stay (HOSPITAL_COMMUNITY)
Admission: EM | Admit: 2019-09-02 | Discharge: 2019-09-04 | DRG: 291 | Disposition: A | Discharge status: Home / Self Care | Payer: Medicare Other | Attending: Family Medicine | Admitting: Family Medicine

## 2019-09-02 DIAGNOSIS — Z9119 Patient's noncompliance with other medical treatment and regimen: Secondary | ICD-10-CM | POA: Diagnosis not present

## 2019-09-02 DIAGNOSIS — I2582 Chronic total occlusion of coronary artery: Secondary | ICD-10-CM | POA: Diagnosis present

## 2019-09-02 DIAGNOSIS — E785 Hyperlipidemia, unspecified: Secondary | ICD-10-CM | POA: Diagnosis present

## 2019-09-02 DIAGNOSIS — Z833 Family history of diabetes mellitus: Secondary | ICD-10-CM | POA: Diagnosis not present

## 2019-09-02 DIAGNOSIS — I255 Ischemic cardiomyopathy: Secondary | ICD-10-CM | POA: Diagnosis present

## 2019-09-02 DIAGNOSIS — I252 Old myocardial infarction: Secondary | ICD-10-CM | POA: Diagnosis not present

## 2019-09-02 DIAGNOSIS — F17210 Nicotine dependence, cigarettes, uncomplicated: Secondary | ICD-10-CM | POA: Diagnosis present

## 2019-09-02 DIAGNOSIS — I5043 Acute on chronic combined systolic (congestive) and diastolic (congestive) heart failure: Secondary | ICD-10-CM | POA: Diagnosis present

## 2019-09-02 DIAGNOSIS — Z951 Presence of aortocoronary bypass graft: Secondary | ICD-10-CM

## 2019-09-02 DIAGNOSIS — J441 Chronic obstructive pulmonary disease with (acute) exacerbation: Secondary | ICD-10-CM | POA: Diagnosis present

## 2019-09-02 DIAGNOSIS — I110 Hypertensive heart disease with heart failure: Principal | ICD-10-CM | POA: Diagnosis present

## 2019-09-02 DIAGNOSIS — Z820 Family history of epilepsy and other diseases of the nervous system: Secondary | ICD-10-CM

## 2019-09-02 DIAGNOSIS — I2510 Atherosclerotic heart disease of native coronary artery without angina pectoris: Secondary | ICD-10-CM | POA: Diagnosis present

## 2019-09-02 DIAGNOSIS — T501X5A Adverse effect of loop [high-ceiling] diuretics, initial encounter: Secondary | ICD-10-CM | POA: Diagnosis not present

## 2019-09-02 DIAGNOSIS — I10 Essential (primary) hypertension: Secondary | ICD-10-CM | POA: Diagnosis present

## 2019-09-02 DIAGNOSIS — J9621 Acute and chronic respiratory failure with hypoxia: Secondary | ICD-10-CM | POA: Diagnosis present

## 2019-09-02 DIAGNOSIS — Z96659 Presence of unspecified artificial knee joint: Secondary | ICD-10-CM | POA: Diagnosis present

## 2019-09-02 DIAGNOSIS — I5023 Acute on chronic systolic (congestive) heart failure: Secondary | ICD-10-CM | POA: Diagnosis not present

## 2019-09-02 DIAGNOSIS — K219 Gastro-esophageal reflux disease without esophagitis: Secondary | ICD-10-CM | POA: Diagnosis present

## 2019-09-02 DIAGNOSIS — J9600 Acute respiratory failure, unspecified whether with hypoxia or hypercapnia: Secondary | ICD-10-CM

## 2019-09-02 DIAGNOSIS — Z20822 Contact with and (suspected) exposure to covid-19: Secondary | ICD-10-CM | POA: Diagnosis present

## 2019-09-02 DIAGNOSIS — I509 Heart failure, unspecified: Secondary | ICD-10-CM | POA: Diagnosis present

## 2019-09-02 DIAGNOSIS — Z8249 Family history of ischemic heart disease and other diseases of the circulatory system: Secondary | ICD-10-CM | POA: Diagnosis not present

## 2019-09-02 DIAGNOSIS — Z7982 Long term (current) use of aspirin: Secondary | ICD-10-CM | POA: Diagnosis not present

## 2019-09-02 DIAGNOSIS — I952 Hypotension due to drugs: Secondary | ICD-10-CM | POA: Diagnosis not present

## 2019-09-02 LAB — CBC WITH DIFFERENTIAL/PLATELET
Abs Immature Granulocytes: 0.05 10*3/uL (ref 0.00–0.07)
Basophils Absolute: 0.1 10*3/uL (ref 0.0–0.1)
Basophils Relative: 1 %
Eosinophils Absolute: 0.2 10*3/uL (ref 0.0–0.5)
Eosinophils Relative: 2 %
HCT: 47.3 % (ref 39.0–52.0)
Hemoglobin: 14.7 g/dL (ref 13.0–17.0)
Immature Granulocytes: 1 %
Lymphocytes Relative: 10 %
Lymphs Abs: 1.1 10*3/uL (ref 0.7–4.0)
MCH: 29 pg (ref 26.0–34.0)
MCHC: 31.1 g/dL (ref 30.0–36.0)
MCV: 93.3 fL (ref 80.0–100.0)
Monocytes Absolute: 0.9 10*3/uL (ref 0.1–1.0)
Monocytes Relative: 8 %
Neutro Abs: 8.4 10*3/uL — ABNORMAL HIGH (ref 1.7–7.7)
Neutrophils Relative %: 78 %
Platelets: 284 10*3/uL (ref 150–400)
RBC: 5.07 MIL/uL (ref 4.22–5.81)
RDW: 15.3 % (ref 11.5–15.5)
WBC: 10.7 10*3/uL — ABNORMAL HIGH (ref 4.0–10.5)
nRBC: 0 % (ref 0.0–0.2)

## 2019-09-02 LAB — POC SARS CORONAVIRUS 2 AG -  ED: SARS Coronavirus 2 Ag: NEGATIVE

## 2019-09-02 LAB — URINALYSIS, ROUTINE W REFLEX MICROSCOPIC
Bacteria, UA: NONE SEEN
Bilirubin Urine: NEGATIVE
Glucose, UA: NEGATIVE mg/dL
Hgb urine dipstick: NEGATIVE
Ketones, ur: NEGATIVE mg/dL
Leukocytes,Ua: NEGATIVE
Nitrite: NEGATIVE
Protein, ur: 30 mg/dL — AB
Specific Gravity, Urine: 1.014 (ref 1.005–1.030)
pH: 6 (ref 5.0–8.0)

## 2019-09-02 LAB — TROPONIN I (HIGH SENSITIVITY)
Troponin I (High Sensitivity): 37 ng/L — ABNORMAL HIGH (ref <18)
Troponin I (High Sensitivity): 50 ng/L — ABNORMAL HIGH (ref <18)

## 2019-09-02 LAB — BASIC METABOLIC PANEL
Anion gap: 12 (ref 5–15)
BUN: 12 mg/dL (ref 8–23)
CO2: 23 mmol/L (ref 22–32)
Calcium: 9 mg/dL (ref 8.9–10.3)
Chloride: 108 mmol/L (ref 98–111)
Creatinine, Ser: 1.14 mg/dL (ref 0.61–1.24)
GFR calc Af Amer: >60 mL/min (ref >60)
GFR calc non Af Amer: >60 mL/min (ref >60)
Glucose, Bld: 142 mg/dL — ABNORMAL HIGH (ref 70–99)
Potassium: 4.2 mmol/L (ref 3.5–5.1)
Sodium: 143 mmol/L (ref 135–145)

## 2019-09-02 LAB — LACTIC ACID, PLASMA
Lactic Acid, Venous: 1 mmol/L (ref 0.5–1.9)
Lactic Acid, Venous: 1.1 mmol/L (ref 0.5–1.9)

## 2019-09-02 LAB — BRAIN NATRIURETIC PEPTIDE: B Natriuretic Peptide: 2357.3 pg/mL — ABNORMAL HIGH (ref 0.0–100.0)

## 2019-09-02 MED ORDER — IPRATROPIUM-ALBUTEROL 0.5-2.5 (3) MG/3ML IN SOLN
3.0000 mL | Freq: Once | RESPIRATORY_TRACT | Status: AC
  Administered 2019-09-02: 3 mL via RESPIRATORY_TRACT
  Filled 2019-09-02: qty 3

## 2019-09-02 MED ORDER — FUROSEMIDE 10 MG/ML IJ SOLN
40.0000 mg | Freq: Once | INTRAMUSCULAR | Status: AC
  Administered 2019-09-02: 40 mg via INTRAVENOUS
  Filled 2019-09-02: qty 4

## 2019-09-02 NOTE — ED Triage Notes (Signed)
From home. SOB that started this morning. Hx of COPD and home 02, but pt states he does not use it. 86% on RA via EMS. Started on NRB @15L . Given 2G Mag and 125 soludmedrol in route. Pt A/Ox3 at this time.

## 2019-09-02 NOTE — ED Provider Notes (Signed)
[facility] EMERGENCY DEPARTMENT Provider Note   CSN: [id number] Arrival date & time: 09/02/19  1845     History Chief Complaint  Patient presents with  . Shortness of Breath    Ray Smith is a 74 y.o. male.  HPI Patient has history of COPD.  EMR indicates that he has home O2 but patient reports he does not wear any oxygen.  He reports he was climbing in and out of a dumpster today and became extremely short of breath and had chest pain as well.  He reports that he basically got to the point where he could not breathe and had to call EMS.  He reports he chronically coughs up a white, gelatinous-looking sputum.  He has not had any fevers or general body aches.  No swelling in the legs.  EMS treated patient with supplemental oxygen, magnesium Solu-Medrol.  Patient reports he felt much better after treatment by EMS.  He does not have any chest pain at this time.  He reports his shortness of breath is much better (patient is on 3 L nasal cannula oxygen now in the ED).    Past Medical History:  Diagnosis Date  . Acute ST elevation myocardial infarction (STEMI) involving left anterior descending (LAD) coronary artery (HCC) 12/30/2017  . Acute systolic heart failure (HCC) 12/30/2017  . Coronary artery disease    a.  presented with CP and inf STE but no ACS - LHC (10/23/13):  Dist LM 40-60%, ostial LAD 80%, mid LAD 50%, ostial CFX 50-70%, mid RCA 50%.  EF 60%. - CP not felt to be ischemic; CABG vs Med Rx d/w pt - pt opted for Med Rx  . History of kidney stones    "more than 20" (01/09/2018)  . Hyperlipidemia   . Hypertension   . Marijuana abuse   . Tobacco dependence     Patient Active Problem List   Diagnosis Date Noted  . Acute exacerbation of CHF (congestive heart failure) (HCC) 09/02/2019  . Adjustment disorder with mixed disturbance of emotions and conduct   . Protein-calorie malnutrition, severe 01/10/2018  . Orthostatic hypotension 01/09/2018  . Weakness  01/09/2018  . Mouth pain 01/09/2018  . CKD (chronic kidney disease) stage 2, GFR 60-89 ml/min 01/09/2018  . Marijuana abuse 01/09/2018  . Acute systolic heart failure (HCC) 01/02/2018  . STEMI (ST elevation myocardial infarction) (HCC) 12/30/2017  . STEMI involving left anterior descending coronary artery (HCC) 12/30/2017  . Acute encephalopathy 10/06/2017  . AKI (acute kidney injury) (HCC)   . Altered mental status   . Hypertension 11/12/2013  . Dyslipidemia 10/24/2013  . ACS (acute coronary syndrome) (HCC) 10/23/2013  . Chest pain 10/23/2013  . Coronary Artery Disease 10/23/2013  . COPD (chronic obstructive pulmonary disease) (HCC) 10/23/2013  . Tobacco abuse 10/23/2013  . Precordial pain 10/23/2013    Past Surgical History:  Procedure Laterality Date  . CORONARY/GRAFT ACUTE MI REVASCULARIZATION N/A 12/30/2017   Procedure: Coronary/Graft Acute MI Revascularization;  Surgeon: Tonny Bollman, MD;  Location: [facility] INVASIVE CV LAB;  Service: Cardiovascular;  Laterality: N/A;  Distal LM into LAD Xience [place] 3.5x26mm Ostial Cx Xience [place] 3.5x45mm  . LEFT HEART CATH AND CORONARY ANGIOGRAPHY N/A 12/30/2017   Procedure: LEFT HEART CATH AND CORONARY ANGIOGRAPHY;  Surgeon: Tonny Bollman, MD;  Location: [facility] INVASIVE CV LAB;  Service: Cardiovascular;  Laterality: N/A;  . LEFT HEART CATHETERIZATION WITH CORONARY ANGIOGRAM Bilateral 10/23/2013   Procedure: LEFT HEART CATHETERIZATION WITH CORONARY ANGIOGRAM;  Surgeon: Sherilyn Cooter  Carlye Grippe, MD;  Location: [facility] CATH LAB;  Service: Cardiovascular;  Laterality: Bilateral;  . snake bite surgery    . TONSILLECTOMY  1955  . TOTAL KNEE ARTHROPLASTY  2001       Family History  Problem Relation Age of Onset  . Heart disease Mother   . Alzheimer's disease Father   . Multiple sclerosis Sister   . Diabetes Maternal Grandmother   . Drug abuse Other     Social History   Tobacco Use  . Smoking status: Current Every Day Smoker    Packs/day: 6.00     Years: 40.00    Pack years: 240.00    Types: Cigarettes  . Smokeless tobacco: Never Used  Substance Use Topics  . Alcohol use: No  . Drug use: Yes    Types: Marijuana    Home Medications Prior to Admission medications   Medication Sig Start Date End Date Taking? Authorizing Provider  carvedilol (COREG) 3.125 MG tablet Take 1 tablet (3.125 mg total) by mouth 2 (two) times daily with a meal. Patient taking differently: Take 3.125 mg by mouth daily.  04/07/19  Yes Kathyrn Drown D, NP  clopidogrel (PLAVIX) 75 MG tablet Take 1 tablet (75 mg total) by mouth daily. 04/07/19  Yes Kathyrn Drown D, NP  aspirin 81 MG EC tablet Take 1 tablet (81 mg total) by mouth daily. Patient not taking: Reported on 09/02/2019 04/07/19   Tommie [name], NP  atorvastatin (LIPITOR) 80 MG tablet Take 1 tablet (80 mg total) by mouth daily at 6 PM. Patient not taking: Reported on 04/07/2019 01/02/18   Reino Bellis B, NP  feeding supplement, ENSURE ENLIVE, (ENSURE ENLIVE) LIQD Take 237 mLs by mouth at bedtime. Patient not taking: Reported on 04/07/2019 01/19/18   Dana Allan I, MD  folic acid (FOLVITE) 1 MG tablet Take 1 tablet (1 mg total) by mouth daily. Patient not taking: Reported on 04/07/2019 01/20/18   Dana Allan I, MD  nitroGLYCERIN (NITROSTAT) 0.4 MG SL tablet Place 1 tablet (0.4 mg total) under the tongue every 5 (five) minutes x 3 doses as needed for chest pain. Patient not taking: Reported on 04/07/2019 01/02/18   Reino Bellis B, NP  spironolactone (ALDACTONE) 25 MG tablet Take 0.5 tablets (12.5 mg total) by mouth daily. Patient not taking: Reported on 09/02/2019 04/07/19   Tommie [name], NP  sucralfate (CARAFATE) 1 GM/10ML suspension Take 10 mLs (1 g total) by mouth 4 (four) times daily -  with meals and at bedtime. Patient not taking: Reported on 04/07/2019 01/19/18 03/18/26  Dana Allan I, MD  vitamin C (VITAMIN C) 500 MG tablet Take 1 tablet (500 mg total) by mouth daily.  Patient not taking: Reported on 04/07/2019 01/20/18   Dana Allan I, MD  zinc sulfate 220 (50 Zn) MG capsule Take 1 capsule (220 mg total) by mouth daily. Patient not taking: Reported on 04/07/2019 01/20/18   Dana Allan I, MD    Allergies    Tramadol, Flexeril [cyclobenzaprine], Ibuprofen, and Lactose intolerance (gi)  Review of Systems   Review of Systems 10 Systems reviewed and are negative for acute change except as noted in the HPI.  Physical Exam Updated Vital Signs BP (!) 141/102   Pulse 79   Resp (!) 22   Ht 5\' 10"  (1.778 m)   Wt 55 kg   SpO2 98%   BMI 17.40 kg/m   Physical Exam Constitutional:      Comments: Patient is alert.  No  acute distress.  HENT:     Head: Normocephalic and atraumatic.  Eyes:     Extraocular Movements: Extraocular movements intact.  Cardiovascular:     Rate and Rhythm: Normal rate and regular rhythm.  Pulmonary:     Comments: Occasional expiratory wheeze.  Breath sounds are soft diffusely. Abdominal:     General: There is no distension.     Palpations: Abdomen is soft.     Tenderness: There is no abdominal tenderness. There is no guarding.  Musculoskeletal:        General: Normal range of motion.     Comments: Legs are very thin.  Patient has just trace edema.  Calves are soft and nontender.  Skin:    General: Skin is warm and dry.  Neurological:     General: No focal deficit present.     Mental Status: He is oriented to person, place, and time.     Coordination: Coordination normal.  Psychiatric:        Mood and Affect: Mood normal.     ED Results / Procedures / Treatments   Labs (all labs ordered are listed, but only abnormal results are displayed) Labs Reviewed  BASIC METABOLIC PANEL - Abnormal; Notable for the following components:      Result Value   Glucose, Bld 142 (*)    All other components within normal limits  BRAIN NATRIURETIC PEPTIDE - Abnormal; Notable for the following components:   B Natriuretic  Peptide 2,357.3 (*)    All other components within normal limits  CBC WITH DIFFERENTIAL/PLATELET - Abnormal; Notable for the following components:   WBC 10.7 (*)    Neutro Abs 8.4 (*)    All other components within normal limits  URINALYSIS, ROUTINE W REFLEX MICROSCOPIC - Abnormal; Notable for the following components:   Protein, ur 30 (*)    All other components within normal limits  TROPONIN I (HIGH SENSITIVITY) - Abnormal; Notable for the following components:   Troponin I (High Sensitivity) 37 (*)    All other components within normal limits  TROPONIN I (HIGH SENSITIVITY) - Abnormal; Notable for the following components:   Troponin I (High Sensitivity) 50 (*)    All other components within normal limits  LACTIC ACID, PLASMA  LACTIC ACID, PLASMA  POC SARS CORONAVIRUS 2 AG -  ED    EKG EKG Interpretation  Date/Time:  Tuesday September 02 2019 19:06:44 EST Ventricular Rate:  78 PR Interval:    QRS Duration: 156 QT Interval:  444 QTC Calculation: 506 R Axis:   -100 Text Interpretation: Sinus rhythm Prolonged PR interval LAE, consider biatrial enlargement Right bundle branch block Anterior infarct, age indeterminate When compared with ECG of 03/18/2018, No significant change was found Confirmed by Dione Booze ([id number]) on 09/03/2019 12:08:12 AM   Radiology DG Chest Port 1 View  Result Date: 09/02/2019 CLINICAL DATA:  Shortness of breath EXAM: PORTABLE CHEST 1 VIEW COMPARISON:  03/18/2018 FINDINGS: Cardiac shadow is stable. Aortic calcifications are again seen and stable. Mild vascular congestion is noted with interstitial edema. No sizable effusion is seen. No bony abnormality is noted. IMPRESSION: Mild CHF. Electronically Signed   By: Alcide Clever M.D.   On: 09/02/2019 20:28    Procedures Procedures (including critical care time) CRITICAL CARE Performed by: Arby Barrette   Total critical care time: 30 minutes  Critical care time was exclusive of separately billable procedures  and treating other patients.  Critical care was necessary to treat or prevent imminent or life-threatening  deterioration.  Critical care was time spent personally by me on the following activities: development of treatment plan with patient and/or surrogate as well as nursing, discussions with consultants, evaluation of patient's response to treatment, examination of patient, obtaining history from patient or surrogate, ordering and performing treatments and interventions, ordering and review of laboratory studies, ordering and review of radiographic studies, pulse oximetry and re-evaluation of patient's condition. Medications Ordered in ED Medications  ipratropium-albuterol (DUONEB) 0.5-2.5 (3) MG/3ML nebulizer solution 3 mL (3 mLs Nebulization Given 09/02/19 2213)  furosemide (LASIX) injection 40 mg (40 mg Intravenous Given 09/02/19 2255)    ED Course  I have reviewed the triage vital signs and the nursing notes.  Pertinent labs & imaging results that were available during my care of the patient were reviewed by me and considered in my medical decision making (see chart for details).  Clinical Course as of Sep 02 36  Tue Sep 02, 2019  2257 Consult: Dr. Loney Loh for admission.   [MP]    Clinical Course User Index [MP] Arby Barrette, MD   MDM Rules/Calculators/A&P                      Patient presents aligned above.  He had acute onset of fairly severe shortness of breath.  Patient does not use his home oxygen although chart indicates that he would normally be on oxygen.  He became extremely short of breath today after exerting himself climbing into a dumpster.  This x-ray shows some vascular congestion and patient has significantly elevated BNP.  He also has history of COPD.  He did respond positively to treatment for COPD but with mild elevations in cardiac enzymes, vascular congestion on chest x-ray and elevated BNP will admit for treatment of CHF with comorbid COPD.  Ray Smith was  evaluated in Emergency Department on 09/03/2019 for the symptoms described in the history of present illness. He was evaluated in the context of the global COVID-19 pandemic, which necessitated consideration that the patient might be at risk for infection with the SARS-CoV-2 virus that causes COVID-19. Institutional protocols and algorithms that pertain to the evaluation of patients at risk for COVID-19 are in a state of rapid change based on information released by regulatory bodies including the CDC and federal and state organizations. These policies and algorithms were followed during the patient's care in the ED. Final Clinical Impression(s) / ED Diagnoses Final diagnoses:  Acute on chronic congestive heart failure, unspecified heart failure type (HCC)  Acute on chronic respiratory failure with hypoxia [facility])    Rx / DC Orders ED Discharge Orders    None       Arby Barrette, MD 09/03/19 0041

## 2019-09-02 NOTE — ED Notes (Signed)
Informed RN Adam POC covid test neg

## 2019-09-03 ENCOUNTER — Inpatient Hospital Stay (HOSPITAL_COMMUNITY): Payer: Medicare Other

## 2019-09-03 ENCOUNTER — Encounter (HOSPITAL_COMMUNITY): Payer: Self-pay | Admitting: Internal Medicine

## 2019-09-03 DIAGNOSIS — J441 Chronic obstructive pulmonary disease with (acute) exacerbation: Secondary | ICD-10-CM

## 2019-09-03 DIAGNOSIS — I5023 Acute on chronic systolic (congestive) heart failure: Secondary | ICD-10-CM

## 2019-09-03 DIAGNOSIS — J9600 Acute respiratory failure, unspecified whether with hypoxia or hypercapnia: Secondary | ICD-10-CM

## 2019-09-03 LAB — TROPONIN I (HIGH SENSITIVITY): Troponin I (High Sensitivity): 37 ng/L — ABNORMAL HIGH (ref <18)

## 2019-09-03 LAB — ECHOCARDIOGRAM COMPLETE
Height: 70 in
Weight: 1932.99 oz

## 2019-09-03 LAB — MAGNESIUM: Magnesium: 2.1 mg/dL (ref 1.7–2.4)

## 2019-09-03 MED ORDER — ENOXAPARIN SODIUM 40 MG/0.4ML ~~LOC~~ SOLN
40.0000 mg | SUBCUTANEOUS | Status: DC
  Filled 2019-09-03: qty 0.4

## 2019-09-03 MED ORDER — ASPIRIN EC 81 MG PO TBEC
81.0000 mg | DELAYED_RELEASE_TABLET | Freq: Every day | ORAL | Status: DC
  Administered 2019-09-03 – 2019-09-04 (×2): 81 mg via ORAL
  Filled 2019-09-03 (×2): qty 1

## 2019-09-03 MED ORDER — SACUBITRIL-VALSARTAN 24-26 MG PO TABS
1.0000 | ORAL_TABLET | Freq: Two times a day (BID) | ORAL | Status: DC
  Administered 2019-09-03 – 2019-09-04 (×3): 1 via ORAL
  Filled 2019-09-03 (×3): qty 1

## 2019-09-03 MED ORDER — GUAIFENESIN ER 600 MG PO TB12
600.0000 mg | ORAL_TABLET | Freq: Two times a day (BID) | ORAL | Status: DC
  Administered 2019-09-03 – 2019-09-04 (×4): 600 mg via ORAL
  Filled 2019-09-03 (×4): qty 1

## 2019-09-03 MED ORDER — ALBUTEROL SULFATE (2.5 MG/3ML) 0.083% IN NEBU: 2.5000 mg | INHALATION_SOLUTION | RESPIRATORY_TRACT | Status: DC | PRN

## 2019-09-03 MED ORDER — PANTOPRAZOLE SODIUM 40 MG PO TBEC
40.0000 mg | DELAYED_RELEASE_TABLET | Freq: Every day | ORAL | Status: DC
  Administered 2019-09-03: 40 mg via ORAL
  Filled 2019-09-03: qty 1

## 2019-09-03 MED ORDER — CARVEDILOL 3.125 MG PO TABS
3.1250 mg | ORAL_TABLET | Freq: Two times a day (BID) | ORAL | Status: DC
  Administered 2019-09-03 – 2019-09-04 (×3): 3.125 mg via ORAL
  Filled 2019-09-03 (×3): qty 1

## 2019-09-03 MED ORDER — FUROSEMIDE 10 MG/ML IJ SOLN
80.0000 mg | Freq: Two times a day (BID) | INTRAMUSCULAR | Status: DC
  Administered 2019-09-03: 80 mg via INTRAVENOUS
  Filled 2019-09-03: qty 8

## 2019-09-03 MED ORDER — FUROSEMIDE 10 MG/ML IJ SOLN
40.0000 mg | Freq: Two times a day (BID) | INTRAMUSCULAR | Status: DC
  Administered 2019-09-03: 40 mg via INTRAVENOUS
  Filled 2019-09-03: qty 4

## 2019-09-03 MED ORDER — BUDESONIDE 0.25 MG/2ML IN SUSP
0.2500 mg | Freq: Two times a day (BID) | RESPIRATORY_TRACT | Status: DC
  Administered 2019-09-03 – 2019-09-04 (×3): 0.25 mg via RESPIRATORY_TRACT
  Filled 2019-09-03 (×3): qty 2

## 2019-09-03 MED ORDER — SODIUM CHLORIDE 0.9% FLUSH: 3.0000 mL | INTRAVENOUS | Status: DC | PRN

## 2019-09-03 MED ORDER — CLOPIDOGREL BISULFATE 75 MG PO TABS
75.0000 mg | ORAL_TABLET | Freq: Every day | ORAL | Status: DC
  Administered 2019-09-03 – 2019-09-04 (×2): 75 mg via ORAL
  Filled 2019-09-03 (×2): qty 1

## 2019-09-03 MED ORDER — SODIUM CHLORIDE 0.9% FLUSH
3.0000 mL | Freq: Two times a day (BID) | INTRAVENOUS | Status: DC
  Administered 2019-09-03 – 2019-09-04 (×3): 3 mL via INTRAVENOUS

## 2019-09-03 MED ORDER — IPRATROPIUM-ALBUTEROL 0.5-2.5 (3) MG/3ML IN SOLN
3.0000 mL | Freq: Three times a day (TID) | RESPIRATORY_TRACT | Status: DC
  Administered 2019-09-04: 3 mL via RESPIRATORY_TRACT
  Filled 2019-09-03 (×2): qty 3

## 2019-09-03 MED ORDER — PREDNISONE 20 MG PO TABS
40.0000 mg | ORAL_TABLET | Freq: Every day | ORAL | Status: DC
  Administered 2019-09-03 – 2019-09-04 (×2): 40 mg via ORAL
  Filled 2019-09-03 (×2): qty 2

## 2019-09-03 MED ORDER — ACETAMINOPHEN 325 MG PO TABS: 650.0000 mg | ORAL_TABLET | ORAL | Status: DC | PRN

## 2019-09-03 MED ORDER — SODIUM CHLORIDE 0.9 % IV SOLN: 250.0000 mL | INTRAVENOUS | Status: DC | PRN

## 2019-09-03 MED ORDER — ATORVASTATIN CALCIUM 80 MG PO TABS
80.0000 mg | ORAL_TABLET | Freq: Every day | ORAL | Status: DC
  Administered 2019-09-03: 80 mg via ORAL
  Filled 2019-09-03: qty 1

## 2019-09-03 MED ORDER — IPRATROPIUM-ALBUTEROL 0.5-2.5 (3) MG/3ML IN SOLN
3.0000 mL | Freq: Four times a day (QID) | RESPIRATORY_TRACT | Status: DC
  Administered 2019-09-03 (×4): 3 mL via RESPIRATORY_TRACT
  Filled 2019-09-03 (×4): qty 3

## 2019-09-03 NOTE — Progress Notes (Addendum)
RN entered pt room to administer his lovenox injection and the pt refused he stated last time he was in the hospital and got these shots he was black and blue and sore for 2 weeks. Educated pt and he still stated " the answer is no". Will notify MD

## 2019-09-03 NOTE — Consult Note (Signed)
Cardiology Consultation:   Patient ID: SHUAYB SCHEPERS MRN: [id number]; DOB: Mar 18, 1946  Admit date: 09/02/2019 Date of Consult: 09/03/2019  Primary Care Provider: Patient, No Pcp Per Primary Cardiologist: Lesleigh Noe, MD  Primary Electrophysiologist:  None    Patient Profile:   Ray Smith is a 74 y.o. male with a hx of CAD CABG who is being seen today for the evaluation of Acute systolic HF at the request of Dr. Mahala Menghini.  History of Present Illness:   [name] is a 74 year old male with coronary artery disease with prior history of chronic noncompliance, smoking presumed COPD here with acute decompensated systolic heart failure.  Earlier today was asking when he would go home.  In review of Dr. Mahala Menghini note he was trying to get things out of a dumpster yesterday and they weighed 200 pounds which is why came in.  Anxious.  He was satting 86% on room air at EMS with 15 L nonrebreather.  Solu-Medrol was administered.  He has been short of breath for the past 2 years and upset that no one else has been able to help him.  Productive cough.  Only takes aspirin and one other medication at home.  Chest x-ray personally reviewed shows vascular congestion.  IV Lasix has been administered.  Currently he is fairly comfortable in bed.  Deep voice.  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  Feels improved.  He is still not at baseline however.  A few years ago when he had his MI, his survival was quite low.  He states that he works 7 days a week now.  Heart Pathway Score:     Past Medical History:  Diagnosis Date  . Acute ST elevation myocardial infarction (STEMI) involving left anterior descending (LAD) coronary artery (HCC) 12/30/2017  . Acute systolic heart failure (HCC) 12/30/2017  . Coronary artery disease    a.  presented with CP and inf STE but no ACS - LHC (10/23/13):  Dist LM 40-60%, ostial LAD 80%, mid LAD 50%, ostial CFX 50-70%, mid RCA 50%.  EF 60%. -  CP not felt to be ischemic; CABG vs Med Rx d/w pt - pt opted for Med Rx  . History of kidney stones    "more than 20" (01/09/2018)  . Hyperlipidemia   . Hypertension   . Marijuana abuse   . Tobacco dependence     Past Surgical History:  Procedure Laterality Date  . CORONARY/GRAFT ACUTE MI REVASCULARIZATION N/A 12/30/2017   Procedure: Coronary/Graft Acute MI Revascularization;  Surgeon: Tonny Bollman, MD;  Location: [facility] INVASIVE CV LAB;  Service: Cardiovascular;  Laterality: N/A;  Distal LM into LAD Xience [place] 3.5x58mm Ostial Cx Xience [place] 3.5x70mm  . LEFT HEART CATH AND CORONARY ANGIOGRAPHY N/A 12/30/2017   Procedure: LEFT HEART CATH AND CORONARY ANGIOGRAPHY;  Surgeon: Tonny Bollman, MD;  Location: [facility] INVASIVE CV LAB;  Service: Cardiovascular;  Laterality: N/A;  . LEFT HEART CATHETERIZATION WITH CORONARY ANGIOGRAM Bilateral 10/23/2013   Procedure: LEFT HEART CATHETERIZATION WITH CORONARY ANGIOGRAM;  Surgeon: Lesleigh Noe, MD;  Location: [facility] CATH LAB;  Service: Cardiovascular;  Laterality: Bilateral;  . snake bite surgery    . TONSILLECTOMY  1955  . TOTAL KNEE ARTHROPLASTY  2001     Home Medications:  Prior to Admission medications   Medication Sig Start Date End Date Taking? Authorizing Provider  carvedilol (COREG) 3.125 MG tablet Take 1 tablet (3.125 mg total) by mouth 2 (  two) times daily with a meal. Patient taking differently: Take 3.125 mg by mouth daily.  04/07/19  Yes Georgie Chard D, NP  clopidogrel (PLAVIX) 75 MG tablet Take 1 tablet (75 mg total) by mouth daily. 04/07/19  Yes Georgie Chard D, NP  aspirin 81 MG EC tablet Take 1 tablet (81 mg total) by mouth daily. Patient not taking: Reported on 09/02/2019 04/07/19   Filbert Schilder, NP  atorvastatin (LIPITOR) 80 MG tablet Take 1 tablet (80 mg total) by mouth daily at 6 PM. Patient not taking: Reported on 04/07/2019 01/02/18   Laverda Page B, NP  feeding supplement, ENSURE ENLIVE, (ENSURE ENLIVE) LIQD Take 237 mLs  by mouth at bedtime. Patient not taking: Reported on 04/07/2019 01/19/18   Berton Mount I, MD  folic acid (FOLVITE) 1 MG tablet Take 1 tablet (1 mg total) by mouth daily. Patient not taking: Reported on 04/07/2019 01/20/18   Berton Mount I, MD  nitroGLYCERIN (NITROSTAT) 0.4 MG SL tablet Place 1 tablet (0.4 mg total) under the tongue every 5 (five) minutes x 3 doses as needed for chest pain. Patient not taking: Reported on 04/07/2019 01/02/18   Laverda Page B, NP  spironolactone (ALDACTONE) 25 MG tablet Take 0.5 tablets (12.5 mg total) by mouth daily. Patient not taking: Reported on 09/02/2019 04/07/19   Filbert Schilder, NP  sucralfate (CARAFATE) 1 GM/10ML suspension Take 10 mLs (1 g total) by mouth 4 (four) times daily -  with meals and at bedtime. Patient not taking: Reported on 04/07/2019 01/19/18 03/18/26  Berton Mount I, MD  vitamin C (VITAMIN C) 500 MG tablet Take 1 tablet (500 mg total) by mouth daily. Patient not taking: Reported on 04/07/2019 01/20/18   Berton Mount I, MD  zinc sulfate 220 (50 Zn) MG capsule Take 1 capsule (220 mg total) by mouth daily. Patient not taking: Reported on 04/07/2019 01/20/18   Berton Mount I, MD    Inpatient Medications: Scheduled Meds: . aspirin EC  81 mg Oral Daily  . atorvastatin  80 mg Oral q1800  . budesonide (PULMICORT) nebulizer solution  0.25 mg Nebulization BID  . carvedilol  3.125 mg Oral BID WC  . clopidogrel  75 mg Oral Daily  . enoxaparin (LOVENOX) injection  40 mg Subcutaneous Q24H  . furosemide  40 mg Intravenous BID  . guaiFENesin  600 mg Oral BID  . ipratropium-albuterol  3 mL Nebulization Q6H  . predniSONE  40 mg Oral Q breakfast  . sodium chloride flush  3 mL Intravenous Q12H   Continuous Infusions: . sodium chloride     PRN Meds: sodium chloride, acetaminophen, albuterol, sodium chloride flush  Allergies:    Allergies  Allergen Reactions  . Tramadol Anaphylaxis  . Flexeril [Cyclobenzaprine] Other  (See Comments)    Per patient "it made my heart stop"  . Ibuprofen Other (See Comments)    Overuse damages pt's kidneys   . Lactose Intolerance (Gi) Other (See Comments)    Digestive issues    Social History:   Social History   Socioeconomic History  . Marital status: Divorced    Spouse name: Not on file  . Number of children: Not on file  . Years of education: Not on file  . Highest education level: Not on file  Occupational History  . Not on file  Tobacco Use  . Smoking status: Current Every Day Smoker    Packs/day: 6.00    Years: 40.00    Pack years: 240.00    Types:  Cigarettes  . Smokeless tobacco: Never Used  Substance and Sexual Activity  . Alcohol use: No  . Drug use: Yes    Types: Marijuana  . Sexual activity: Not on file  Other Topics Concern  . Not on file  Social History Narrative  . Not on file   Social Determinants of Health   Financial Resource Strain:   . Difficulty of Paying Living Expenses: Not on file  Food Insecurity:   . Worried About Programme researcher, broadcasting/film/video in the Last Year: Not on file  . Ran Out of Food in the Last Year: Not on file  Transportation Needs:   . Lack of Transportation (Medical): Not on file  . Lack of Transportation (Non-Medical): Not on file  Physical Activity:   . Days of Exercise per Week: Not on file  . Minutes of Exercise per Session: Not on file  Stress:   . Feeling of Stress : Not on file  Social Connections:   . Frequency of Communication with Friends and Family: Not on file  . Frequency of Social Gatherings with Friends and Family: Not on file  . Attends Religious Services: Not on file  . Active Member of Clubs or Organizations: Not on file  . Attends Banker Meetings: Not on file  . Marital Status: Not on file  Intimate Partner Violence:   . Fear of Current or Ex-Partner: Not on file  . Emotionally Abused: Not on file  . Physically Abused: Not on file  . Sexually Abused: Not on file    Family  History:    Family History  Problem Relation Age of Onset  . Heart disease Mother   . Alzheimer's disease Father   . Multiple sclerosis Sister   . Diabetes Maternal Grandmother   . Drug abuse Other      ROS:  Please see the history of present illness.  Denies any fevers chills nausea vomiting syncope bleeding All other ROS reviewed and negative.     Physical Exam/Data:   Vitals:   09/03/19 0230 09/03/19 0428 09/03/19 0737 09/03/19 1206  BP: 112/69 132/65  121/87  Pulse: 73 76  75  Resp:  19  16  Temp:  97.8 F (36.6 C)  97.7 F (36.5 C)  TempSrc:  Oral  Oral  SpO2: 98% 99% 95% 99%  Weight:  54.8 kg    Height:        Intake/Output Summary (Last 24 hours) at 09/03/2019 1457 Last data filed at 09/03/2019 1309 Gross per 24 hour  Intake 687 ml  Output 300 ml  Net 387 ml   Last 3 Weights 09/03/2019 09/02/2019 03/18/2018  Weight (lbs) 120 lb 13 oz 121 lb 4.1 oz 120 lb  Weight (kg) 54.8 kg 55 kg 54.432 kg     Body mass index is 17.33 kg/m.  General:  Well nourished, well developed, in no acute distress HEENT: normal Lymph: no adenopathy Neck: no JVD Endocrine:  No thryomegaly Vascular: No carotid bruits; FA pulses 2+ bilaterally without bruits  Cardiac:  normal S1, S2; RRR; no murmur  Lungs:  clear to auscultation bilaterally, no wheezing, rhonchi or rales, overall decreased air movement bilaterally Abd: soft, nontender, no hepatomegaly  Ext: no edema Musculoskeletal:  No deformities, BUE and BLE strength normal and equal Skin: warm and dry  Neuro:  CNs 2-12 intact, no focal abnormalities noted Psych:  Normal affect   EKG:  The EKG was personally reviewed and demonstrates: Sinus rhythm right bundle branch  block left anterior fascicular block Telemetry:  Telemetry was personally reviewed and demonstrates: Sinus rhythm  Relevant CV Studies: Echocardiogram 2021 this admit-  1. Left ventricular ejection fraction, by estimation, is 25 to 30%. The  left ventricle has  severely decreased function. The left ventricle  demonstrates global hypokinesis. There is moderate left ventricular  hypertrophy. Left ventricular diastolic  parameters are consistent with Grade I diastolic dysfunction (impaired  relaxation). Elevated left ventricular end-diastolic pressure. There is  severe akinesis of the left ventricular, entire apical segment,  anteroseptal wall, anterior wall and distal  inferoapical.  2. Right ventricular systolic function is normal. The right ventricular  size is normal.  3. The mitral valve is grossly normal. Trivial mitral valve  regurgitation.  4. The aortic valve is tricuspid. Aortic valve regurgitation is not  visualized.    Laboratory Data:  High Sensitivity Troponin:   Recent Labs  Lab 09/02/19 1920 09/02/19 2150 09/03/19 0323  TROPONINIHS 37* 50* 37*     Chemistry Recent Labs  Lab 09/02/19 1920  NA 143  K 4.2  CL 108  CO2 23  GLUCOSE 142*  BUN 12  CREATININE 1.14  CALCIUM 9.0  GFRNONAA >60  GFRAA >60  ANIONGAP 12    No results for input(s): PROT, ALBUMIN, AST, ALT, ALKPHOS, BILITOT in the last 168 hours. Hematology Recent Labs  Lab 09/02/19 1920  WBC 10.7*  RBC 5.07  HGB 14.7  HCT 47.3  MCV 93.3  MCH 29.0  MCHC 31.1  RDW 15.3  PLT 284   BNP Recent Labs  Lab 09/02/19 1920  BNP 2,357.3*    DDimer No results for input(s): DDIMER in the last 168 hours.   Radiology/Studies:  DG Chest Port 1 View  Result Date: 09/02/2019 CLINICAL DATA:  Shortness of breath EXAM: PORTABLE CHEST 1 VIEW COMPARISON:  03/18/2018 FINDINGS: Cardiac shadow is stable. Aortic calcifications are again seen and stable. Mild vascular congestion is noted with interstitial edema. No sizable effusion is seen. No bony abnormality is noted. IMPRESSION: Mild CHF. Electronically Signed   By: Alcide Clever M.D.   On: 09/02/2019 20:28   ECHOCARDIOGRAM COMPLETE  Result Date: 09/03/2019    ECHOCARDIOGRAM REPORT   Patient Name:   Ray Smith Date of Exam: 09/03/2019 Medical Rec #:  [id number]     Height:       70.0 in Accession #:    [id number]    Weight:       120.8 lb Date of Birth:  Mar 22, 1946     BSA:          1.685 m Patient Age:    74 years      BP:           132/65 mmHg Patient Gender: M             HR:           67 bpm. Exam Location:  Inpatient Procedure: 2D Echo, Cardiac Doppler and Color Doppler Indications:    I50.23 Acute on chronic systolic (congestive) heart failure  History:        Patient has prior history of Echocardiogram examinations, most                 recent 01/01/2018. CAD and Previous Myocardial Infarction, COPD;                 Risk Factors:Hypertension, Dyslipidemia and Current Smoker.  Sonographer:    Elmarie Shiley Dance Referring Phys: [id number] John Giovanni IMPRESSIONS  1. Left ventricular ejection fraction, by estimation, is 25 to 30%. The left ventricle has severely decreased function. The left ventricle demonstrates global hypokinesis. There is moderate left ventricular hypertrophy. Left ventricular diastolic parameters are consistent with Grade I diastolic dysfunction (impaired relaxation). Elevated left ventricular end-diastolic pressure. There is severe akinesis of the left ventricular, entire apical segment, anteroseptal wall, anterior wall and distal inferoapical.  2. Right ventricular systolic function is normal. The right ventricular size is normal.  3. The mitral valve is grossly normal. Trivial mitral valve regurgitation.  4. The aortic valve is tricuspid. Aortic valve regurgitation is not visualized. Comparison(s): No significant change from prior study. 01/01/2018: 25-30%. FINDINGS  Left Ventricle: Left ventricular ejection fraction, by estimation, is 25 to 30%. The left ventricle has severely decreased function. The left ventricle demonstrates global hypokinesis. Severe akinesis of the left ventricular, entire apical segment, anteroseptal wall, anterior wall and distal inferoapical. The left ventricular  internal cavity size was normal in size. There is moderate left ventricular hypertrophy. Left ventricular diastolic parameters are consistent with Grade I diastolic dysfunction (impaired relaxation). Elevated left ventricular end-diastolic pressure. Right Ventricle: The right ventricular size is normal. No increase in right ventricular wall thickness. Right ventricular systolic function is normal. Left Atrium: Left atrial size was normal in size. Right Atrium: Right atrial size was normal in size. Pericardium: There is no evidence of pericardial effusion. Mitral Valve: The mitral valve is grossly normal. Trivial mitral valve regurgitation. Tricuspid Valve: The tricuspid valve is grossly normal. Tricuspid valve regurgitation is trivial. Aortic Valve: The aortic valve is tricuspid. Aortic valve regurgitation is not visualized. Pulmonic Valve: The pulmonic valve was normal in structure. Pulmonic valve regurgitation is not visualized. Aorta: The aortic root, ascending aorta, aortic arch and descending aorta are all structurally normal, with no evidence of dilitation or obstruction. IAS/Shunts: No atrial level shunt detected by color flow Doppler.  LEFT VENTRICLE PLAX 2D LVIDd:         4.50 cm  Diastology LVIDs:         3.60 cm  LV e' lateral:   7.94 cm/s LV PW:         1.20 cm  LV E/e' lateral: 6.4 LV IVS:        1.20 cm  LV e' medial:    6.96 cm/s LVOT diam:     2.10 cm  LV E/e' medial:  7.3 LV SV:         37 LV SV Index:   22 LVOT Area:     3.46 cm  RIGHT VENTRICLE            IVC RV Basal diam:  2.20 cm    IVC diam: 1.70 cm RV S prime:     9.03 cm/s TAPSE (M-mode): 1.7 cm LEFT ATRIUM         Index LA diam:    3.80 cm 2.26 cm/m  AORTIC VALVE LVOT Vmax:   70.90 cm/s LVOT Vmean:  42.600 cm/s LVOT VTI:    0.106 m  AORTA Ao Root diam: 4.10 cm Ao Asc diam:  3.70 cm MITRAL VALVE MV Area (PHT): 2.87 cm     SHUNTS MV Decel Time: 264 msec     Systemic VTI:  0.11 m MV E velocity: 50.80 cm/s   Systemic Diam: 2.10 cm MV A  velocity: 134.00 cm/s MV E/A ratio:  0.38 Lyman Bishop MD Electronically signed by Lyman Bishop MD Signature Date/Time: 09/03/2019/12:08:21 PM    Final  Assessment and Plan:   Acute on chronic systolic heart failure -Agree with IV Lasix -Goal net out 1 to 2 L daily. -Continue to monitor creatinine.  Supplement potassium and magnesium as necessary. -EF 25 to 30%. -I will add low-dose Entresto.  Case management should assist with medication.  Tobacco use -Counseled on cessation.  COPD -Being treated for this as well, got Solu-Medrol.  Productive cough.  Could be multifactorial.  Severe CAD -Catheterization 12/30/2017: 1.  Severe distal left main stenosis 2.  Acute total occlusion of the proximal LAD 3.  Severe ostial left circumflex stenosis 4.  Moderate mid RCA stenosis 5.  Severe segmental LV systolic dysfunction consistent with large anterolateral infarction with LVEF estimated at 25% 6.  Successful complex PCI of the left mainstem/proximal LAD/proximal circumflex as outlined above  Recommend dual antiplatelet therapy with Aspirin 81mg  daily and Clopidogrel 75mg  daily long-term (beyond 12 months) because of complex left main bifurcation PCI in the setting of anterior MI.  Continue to encourage compliance.       For questions or updates, please contact [facility] Please consult [email or web address] for contact info under     Signed, Donato Schultz, MD  09/03/2019 2:57 PM

## 2019-09-03 NOTE — ED Notes (Signed)
Phoned report to receiving RN, will arrange transport. Pt has all belongings in stretcher.

## 2019-09-03 NOTE — Care Management (Signed)
Consult for SNF and Heart failure home health screen and may place order for PT / OT eval and treat if indicated.   Will need PT/OT eval  Ronny Flurry RN

## 2019-09-03 NOTE — H&P (Signed)
History and Physical    Ray Smith ZSW:[id number] DOB: May 12, 1946 DOA: 09/02/2019  PCP: Patient, No Pcp Per Patient coming from: Home  Chief Complaint: Shortness of breath  HPI: Ray Smith is a 74 y.o. male with medical history significant of COPD on home oxygen per chart but patient reports not using it, CAD, chronic systolic CHF with EF 25 to 30% on echo done July 2019, hypertension, hyperlipidemia, tobacco use presenting to the ED with complaints of shortness of breath.  Oxygen saturation 86% on room air by EMS and was placed on 15 L oxygen via nonrebreather.  He was given 2 g magnesium and Solu-Medrol 125 mg in route.  Patient reports having shortness of breath for the past 2 years and is upset that no one has been able to help him.  States last night he could hardly breathe and had to call EMS.  Also reports having a productive cough.  Denies fevers, chills, wheezing, or chest pain.  States he does not have any lung problems and does not use any inhalers.  His home oxygen machine is broken.  He currently takes only 2 pills at home, one of them is aspirin and he is not sure what the name of the other medication is.  Denies current tobacco use.  Patient was very upset that he has not had anything to eat since he has been in the ED.  No additional history could be obtained from him.  ED Course: Tachypneic.  Satting well on 3 L supplemental oxygen.  WBC count 10.7.  Lactic acid normal x2.  BNP significantly elevated at 2357.  High-sensitivity troponin 37 >50.  EKG without acute ischemic changes.  SARS-CoV-2 rapid antigen test negative. Chest x-ray showing mild vascular congestion with interstitial edema. Patient received IV Lasix 40 mg and DuoNeb treatment in the ED.  Review of Systems:  All systems reviewed and apart from history of presenting illness, are negative.  Past Medical History:  Diagnosis Date  . Acute ST elevation myocardial infarction (STEMI) involving left anterior  descending (LAD) coronary artery ([place]) 12/30/2017  . Acute systolic heart failure ([place]) 12/30/2017  . Coronary artery disease    a.  presented with CP and inf STE but no ACS - LHC (10/23/13):  Dist LM 40-60%, ostial LAD 80%, mid LAD 50%, ostial CFX 50-70%, mid RCA 50%.  EF 60%. - CP not felt to be ischemic; CABG vs Med Rx d/w pt - pt opted for Med Rx  . History of kidney stones    "more than 20" (01/09/2018)  . Hyperlipidemia   . Hypertension   . Marijuana abuse   . Tobacco dependence     Past Surgical History:  Procedure Laterality Date  . CORONARY/GRAFT ACUTE MI REVASCULARIZATION N/A 12/30/2017   Procedure: Coronary/Graft Acute MI Revascularization;  Surgeon: Sherren Mocha, MD;  Location: [place] [facility];  Service: Cardiovascular;  Laterality: N/A;  Distal LM into LAD Xience SIerra 3.5x76mm Ostial Cx [place] 3.5x44mm  . LEFT HEART CATH AND CORONARY ANGIOGRAPHY N/A 12/30/2017   Procedure: LEFT HEART CATH AND CORONARY ANGIOGRAPHY;  Surgeon: Sherren Mocha, MD;  Location: [place] [facility];  Service: Cardiovascular;  Laterality: N/A;  . LEFT HEART CATHETERIZATION WITH CORONARY ANGIOGRAM Bilateral 10/23/2013   Procedure: LEFT HEART CATHETERIZATION WITH CORONARY ANGIOGRAM;  Surgeon: Sinclair Grooms, MD;  Location: [facility];  Service: Cardiovascular;  Laterality: Bilateral;  . snake bite surgery    . TONSILLECTOMY  1955  . TOTAL  KNEE ARTHROPLASTY  2001     reports that he has been smoking cigarettes. He has a 240.00 pack-year smoking history. He has never used smokeless tobacco. He reports current drug use. Drug: Marijuana. He reports that he does not drink alcohol.  Allergies  Allergen Reactions  . Tramadol Anaphylaxis  . Flexeril [Cyclobenzaprine] Other (See Comments)    Per patient "it made my heart stop"  . Ibuprofen Other (See Comments)    Overuse damages pt's kidneys   . Lactose Intolerance (Gi) Other (See Comments)    Digestive issues    Family History  Problem  Relation Age of Onset  . Heart disease Mother   . Alzheimer's disease Father   . Multiple sclerosis Sister   . Diabetes Maternal Grandmother   . Drug abuse Other     Prior to Admission medications   Medication Sig Start Date End Date Taking? Authorizing Provider  carvedilol (COREG) 3.125 MG tablet Take 1 tablet (3.125 mg total) by mouth 2 (two) times daily with a meal. Patient taking differently: Take 3.125 mg by mouth daily.  04/07/19  Yes Georgie Chard D, NP  clopidogrel (PLAVIX) 75 MG tablet Take 1 tablet (75 mg total) by mouth daily. 04/07/19  Yes Georgie Chard D, NP  aspirin 81 MG EC tablet Take 1 tablet (81 mg total) by mouth daily. Patient not taking: Reported on 09/02/2019 04/07/19   Filbert Schilder, NP  atorvastatin (LIPITOR) 80 MG tablet Take 1 tablet (80 mg total) by mouth daily at 6 PM. Patient not taking: Reported on 04/07/2019 01/02/18   Laverda Page B, NP  feeding supplement, ENSURE ENLIVE, (ENSURE ENLIVE) LIQD Take 237 mLs by mouth at bedtime. Patient not taking: Reported on 04/07/2019 01/19/18   Berton Mount I, MD  folic acid (FOLVITE) 1 MG tablet Take 1 tablet (1 mg total) by mouth daily. Patient not taking: Reported on 04/07/2019 01/20/18   Berton Mount I, MD  nitroGLYCERIN (NITROSTAT) 0.4 MG SL tablet Place 1 tablet (0.4 mg total) under the tongue every 5 (five) minutes x 3 doses as needed for chest pain. Patient not taking: Reported on 04/07/2019 01/02/18   Laverda Page B, NP  spironolactone (ALDACTONE) 25 MG tablet Take 0.5 tablets (12.5 mg total) by mouth daily. Patient not taking: Reported on 09/02/2019 04/07/19   Filbert Schilder, NP  sucralfate (CARAFATE) 1 GM/10ML suspension Take 10 mLs (1 g total) by mouth 4 (four) times daily -  with meals and at bedtime. Patient not taking: Reported on 04/07/2019 01/19/18 03/18/26  Berton Mount I, MD  vitamin C (VITAMIN C) 500 MG tablet Take 1 tablet (500 mg total) by mouth daily. Patient not taking: Reported  on 04/07/2019 01/20/18   Berton Mount I, MD  zinc sulfate 220 (50 Zn) MG capsule Take 1 capsule (220 mg total) by mouth daily. Patient not taking: Reported on 04/07/2019 01/20/18   Barnetta Chapel, MD    Physical Exam: Vitals:   09/03/19 0130 09/03/19 0145 09/03/19 0200 09/03/19 0230  BP: 120/85 117/85 130/84 112/69  Pulse: 73 76 76 73  Resp:      SpO2: 98% 96% 98% 98%  Weight:      Height:        Physical Exam  Constitutional: He is oriented to person, place, and time. No distress.  HENT:  Head: Normocephalic.  Eyes: Right eye exhibits no discharge. Left eye exhibits no discharge.  Neck: JVD present.  Cardiovascular: Normal rate, regular rhythm and intact distal pulses.  Pulmonary/Chest: Effort normal. He has no wheezes. He has no rales.  On 3 L supplemental oxygen  Abdominal: Soft. Bowel sounds are normal. He exhibits no distension. There is no abdominal tenderness. There is no guarding.  Musculoskeletal:        General: No edema.     Cervical back: Neck supple.  Neurological: He is alert and oriented to person, place, and time.  Skin: Skin is warm and dry. He is not diaphoretic.     Labs on Admission: I have personally reviewed following labs and imaging studies  CBC: Recent Labs  Lab 09/02/19 1920  WBC 10.7*  NEUTROABS 8.4*  HGB 14.7  HCT 47.3  MCV 93.3  PLT 284   Basic Metabolic Panel: Recent Labs  Lab 09/02/19 1920  NA 143  K 4.2  CL 108  CO2 23  GLUCOSE 142*  BUN 12  CREATININE 1.14  CALCIUM 9.0   GFR: Estimated Creatinine Clearance: 43.6 mL/min (by C-G formula based on SCr of 1.14 mg/dL). Liver Function Tests: No results for input(s): AST, ALT, ALKPHOS, BILITOT, PROT, ALBUMIN in the last 168 hours. No results for input(s): LIPASE, AMYLASE in the last 168 hours. No results for input(s): AMMONIA in the last 168 hours. Coagulation Profile: No results for input(s): INR, PROTIME in the last 168 hours. Cardiac Enzymes: No results for  input(s): CKTOTAL, CKMB, CKMBINDEX, TROPONINI in the last 168 hours. BNP (last 3 results) No results for input(s): PROBNP in the last 8760 hours. HbA1C: No results for input(s): HGBA1C in the last 72 hours. CBG: No results for input(s): GLUCAP in the last 168 hours. Lipid Profile: No results for input(s): CHOL, HDL, LDLCALC, TRIG, CHOLHDL, LDLDIRECT in the last 72 hours. Thyroid Function Tests: No results for input(s): TSH, T4TOTAL, FREET4, T3FREE, THYROIDAB in the last 72 hours. Anemia Panel: No results for input(s): VITAMINB12, FOLATE, FERRITIN, TIBC, IRON, RETICCTPCT in the last 72 hours. Urine analysis:    Component Value Date/Time   COLORURINE YELLOW 09/02/2019 2155   APPEARANCEUR CLEAR 09/02/2019 2155   LABSPEC 1.014 09/02/2019 2155   PHURINE 6.0 09/02/2019 2155   GLUCOSEU NEGATIVE 09/02/2019 2155   HGBUR NEGATIVE 09/02/2019 2155   BILIRUBINUR NEGATIVE 09/02/2019 2155   KETONESUR NEGATIVE 09/02/2019 2155   PROTEINUR 30 (A) 09/02/2019 2155   NITRITE NEGATIVE 09/02/2019 2155   LEUKOCYTESUR NEGATIVE 09/02/2019 2155    Radiological Exams on Admission: DG Chest Port 1 View  Result Date: 09/02/2019 CLINICAL DATA:  Shortness of breath EXAM: PORTABLE CHEST 1 VIEW COMPARISON:  03/18/2018 FINDINGS: Cardiac shadow is stable. Aortic calcifications are again seen and stable. Mild vascular congestion is noted with interstitial edema. No sizable effusion is seen. No bony abnormality is noted. IMPRESSION: Mild CHF. Electronically Signed   By: Alcide Clever M.D.   On: 09/02/2019 20:28    EKG: Independently reviewed.  Sinus rhythm, slight PR prolongation, LAFB and RBBB.  QTc 506.  No significant change since prior tracing.  Assessment/Plan Principal Problem:   Acute exacerbation of CHF (congestive heart failure) (HCC) Active Problems:   Coronary Artery Disease   Hypertension   COPD with acute exacerbation (HCC)   Acute respiratory failure (HCC)   Acute exacerbation chronic systolic  congestive heart failure: History of ischemic cardiomyopathy with severely reduced LVEF of 25 to 30% on prior echo done July 2019.  BNP significantly elevated at 2357.  Chest x-ray with mild vascular congestion with interstitial edema.  Per pharmacy med rec, it seems patient is currently not taking most  of his home medications.  Plan is to continue diuresis with IV Lasix 40 mg twice daily and repeat echocardiogram.  Continue home Coreg.  Monitor intake and output, daily weights, and low-sodium diet with fluid restriction.  Continue to monitor renal function daily.  Cardiology consult placed.  Possible acute COPD exacerbation: COPD is listed in his medical history but he is not on any home inhalers.  Not wheezing on exam at present but it is reported that his respiratory status did improve after receiving IV magnesium, Solu-Medrol, and DuoNeb.  Plan is to give prednisone 40 mg daily continue DuoNebs every 6 hours, Pulmicort nebulizer twice daily, albuterol nebulizer as needed, Mucinex, and flutter valve.  Acute hypoxic respiratory failure: Suspect multifactorial due to acutely decompensated CHF and possible acute COPD exacerbation.  Management as mentioned above.  Continue supplemental oxygen.  Elevated troponin: Suspect due to demand ischemia in the setting of acutely decompensated CHF. High-sensitivity troponin 37 >50.  ACS less likely as EKG without acute ischemic changes and patient is not endorsing any chest pain.  Plan is to trend troponin and continue cardiac monitoring.  CAD: Continue home aspirin, Plavix, Coreg, and Lipitor  Hypertension: Blood pressure mildly elevated.  Lasix as above and continue home Coreg.  Hyperlipidemia: Continue Lipitor  QT prolongation EKG: Continue cardiac monitoring.  Keep potassium above 4 and magnesium above 2.  Repeat EKG in a.m.  Avoid QT prolonging drugs if possible.  DVT prophylaxis: Lovenox Code Status: Full code Family Communication: No family available at  this time. Disposition Plan: Anticipate discharge after greater than 2 midnights.  Will need IV Lasix for diuresis given hypoxia and supplemental oxygen requirement. Consults called: Cardiology consult placed. Admission status: It is my clinical opinion that admission to INPATIENT is reasonable and necessary because of the expectation that this patient will require hospital care that crosses at least 2 midnights to treat this condition based on the medical complexity of the problems presented.  Given the aforementioned information, the predictability of an adverse outcome is felt to be significant.  The medical decision making on this patient was of high complexity and the patient is at high risk for clinical deterioration, therefore this is a level 3 visit.  John Giovanni MD [facility]  If 7PM-7AM, please contact night-coverage [email or web address]  09/03/2019, 2:35 AM

## 2019-09-03 NOTE — Progress Notes (Signed)
  Echocardiogram 2D Echocardiogram has been performed.  Ray Smith G Tia Gelb 09/03/2019, 11:10 AM

## 2019-09-03 NOTE — Progress Notes (Signed)
Dr. Mahala Menghini returned my page and I explained to him that pt was jumping in and out of the bigeminy rythym and the longest run I saw was 4 beats. MD stated that we should monitor the patient, he was started on some new medication and we are also diuresing him as long as it is not sustained if it becomes sustained to let him know.

## 2019-09-03 NOTE — Evaluation (Signed)
Physical Therapy Evaluation Patient Details Name: Ray Smith MRN: [id number] DOB: 1945-11-13 Today's Date: 09/03/2019   History of Present Illness  Ray Smith is a 74 y.o. male with medical history significant of COPD on home oxygen per chart but patient reports not using it, CAD, chronic systolic CHF with EF 25 to 30% on echo done July 2019, hypertension, hyperlipidemia, tobacco use presenting to the ED with complaints of shortness of breath.    Clinical Impression  Pt with decreased insight to safety and deficits. Suspect he has decreased comprehension of his medical condition. Pt with impaired balance and significant drop in SpO2 dec into 70s on RA with amb. After education on what O2 provides pt agreeable. Pt SpO2 inc to 90s with 2Lo2 via [place]. Pt to also benefit from AD for ambulation however he believe ADs make you weak despite education on what could happen if he falls. RN notified. Acute PT to cont to follow.    Follow Up Recommendations Outpatient PT;Supervision/Assistance - 24 hour(however pt denying he needs any therapy s/p d/c)    Equipment Recommendations  (would benefit from [facility] however resistant )    Recommendations for Other Services       Precautions / Restrictions Precautions Precautions: Fall Precaution Comments: pt needs to be on O2 but doesn't comply Restrictions Weight Bearing Restrictions: No      Mobility  Bed Mobility Overal bed mobility: Modified Independent             General bed mobility comments: no difficulty, HOB elevated, no use of bed rail  Transfers Overall transfer level: Needs assistance Equipment used: None Transfers: Sit to/from Stand Sit to Stand: Supervision         General transfer comment: pt impulsively fast, close supervision for safety  Ambulation/Gait Ambulation/Gait assistance: Min assist Gait Distance (Feet): 200 Feet Assistive device: None(pushed O2 tank cart) Gait Pattern/deviations: Decreased stride  length;Staggering right;Drifts right/left Gait velocity: wfl Gait velocity interpretation: >2.62 ft/sec, indicative of community ambulatory General Gait Details: pt unsteady, refused to use cane or walker stating "I don't need that, it just makes you weak" PT had to use O2 cart (not carrier) and pt stated "Let me push that so I can walk better" pt with noted bilat LE weakness and staggering requiring use of UE support for stability especially with increased distance  Stairs Stairs: Yes Stairs assistance: Min assist;Mod assist Stair Management: Alternating pattern;Forwards;No rails Number of Stairs: 10 General stair comments: pt refusing to use the hand rails despite instability because "using a rail will make you weak", pt educated on safety and use of rail to prevent falling and future injuries   Wheelchair Mobility    Modified Rankin (Stroke Patients Only)       Balance Overall balance assessment: Needs assistance Sitting-balance support: Feet supported Sitting balance-Leahy Scale: Good     Standing balance support: No upper extremity supported Standing balance-Leahy Scale: Poor Standing balance comment: pt with fair static standing balance however is unsteady with ambulation without AD                             Pertinent Vitals/Pain Pain Assessment: No/denies pain    Home Living Family/patient expects to be discharged to:: Private residence Living Arrangements: Spouse/significant other;Children Available Help at Discharge: Family;Available 24 hours/day;Available PRN/intermittently(83 yo wife avail 24/7, son in/out t/o day)   Home Access: Stairs to enter Entrance Stairs-Rails: None Entrance Stairs-Number of Steps: 3  Home Layout: One level(has a basement)        Prior Function Level of Independence: Independent         Comments: 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, newspaper        Extremity/Trunk Assessment   Upper Extremity Assessment Upper  Extremity Assessment: Overall WFL for tasks assessed    Lower Extremity Assessment Lower Extremity Assessment: Generalized weakness    Cervical / Trunk Assessment Cervical / Trunk Assessment: Normal  Communication   Communication: No difficulties  Cognition Arousal/Alertness: Awake/alert Behavior During Therapy: Impulsive Overall Cognitive Status: Impaired/Different from baseline Area of Impairment: Safety/judgement                         Safety/Judgement: Decreased awareness of safety;Decreased awareness of deficits     General Comments: suspect this to be personality/baseline. Pt non-compliant with O2 despite SOB. Pt ultimately gave in to O2 stating "well I guess put it on if it'll help me". Pt also refusing to "depend on a rail" on the stairs because  he doesn't want to get weak, however is very unsteady      General Comments General comments (skin integrity, edema, etc.): Pt SpO2 decreased into 70s on RA, after encouragement pt agreed to use 2Lo2 via [place], SPO2 increased to >92%    Exercises     Assessment/Plan    PT Assessment Patient needs continued PT services  PT Problem List Decreased activity tolerance;Decreased balance;Decreased mobility;Decreased coordination;Decreased safety awareness       PT Treatment Interventions DME instruction;Gait training;Stair training;Functional mobility training;Therapeutic activities;Therapeutic exercise;Balance training;Neuromuscular re-education    PT Goals (Current goals can be found in the Care Plan section)  Acute Rehab PT Goals PT Goal Formulation: With patient Time For Goal Achievement: 09/17/19 Potential to Achieve Goals: Good Additional Goals Additional Goal #1: Pt to score >19 on DGI to indicate minimal falls risk.    Frequency Min 3X/week   Barriers to discharge        Co-evaluation               AM-PAC PT "6 Clicks" Mobility  Outcome Measure Help needed turning from your back to your side  while in a flat bed without using bedrails?: None Help needed moving from lying on your back to sitting on the side of a flat bed without using bedrails?: None Help needed moving to and from a bed to a chair (including a wheelchair)?: None Help needed standing up from a chair using your arms (e.g., wheelchair or bedside chair)?: None Help needed to walk in hospital room?: A Little Help needed climbing 3-5 steps with a railing? : A Little 6 Click Score: 22    End of Session Equipment Utilized During Treatment: Gait belt;Oxygen(2Lo2 via [place]) Activity Tolerance: Patient tolerated treatment well Patient left: in bed;with call bell/phone within reach Nurse Communication: Mobility status PT Visit Diagnosis: Unsteadiness on feet (R26.81);Muscle weakness (generalized) (M62.81);Difficulty in walking, not elsewhere classified (R26.2)    Time: 1062-6948 PT Time Calculation (min) (ACUTE ONLY): 21 min   Charges:   PT Evaluation $PT Eval Moderate Complexity: 1 Mod          Kittie Plater, PT, DPT Acute Rehabilitation Services Pager #: [phone] Office #: [phone]   Berline Lopes 09/03/2019, 1:10 PM

## 2019-09-03 NOTE — Evaluation (Signed)
Occupational Therapy Evaluation Patient Details Name: Ray Smith MRN: [id number] DOB: 01/24/1946 Today's Date: 09/03/2019    History of Present Illness Ray Smith is a 74 y.o. male with medical history significant of COPD on home oxygen per chart but patient reports not using it, CAD, chronic systolic CHF with EF 25 to 30% on echo done July 2019, hypertension, hyperlipidemia, tobacco use presenting to the ED with complaints of shortness of breath.   Clinical Impression   Pt PTA: Pt living with son, works daily and reports independence with ADL. Pt currently, noncompliant with medical advice per chart.  Pt advised to sit down for LB Dressing as pt unsafely attempting to donn shoes in standing; pt sat down and able to perform LB ADL. Pt stood at sink for grooming with no LOB episodes. Pt modified independent for sit to stands from bed, couch and commode. Pt ambulating in room with no difficulty. 90% O2 to begin session on RA, but unable to continue to get a good reading  on pulse ox. Pt reported to not feel SOB. Pt seated for meal in room with son in room. No further OT required. OT signing off.  HR 105 BPM with exertion.    Follow Up Recommendations  No OT follow up    Equipment Recommendations  None recommended by OT    Recommendations for Other Services       Precautions / Restrictions Precautions Precautions: Fall Precaution Comments: O2 required, but states "I don't need that." Restrictions Weight Bearing Restrictions: No      Mobility Bed Mobility Overal bed mobility: Modified Independent             General bed mobility comments: no difficulty, HOB elevated, no use of bed rail  Transfers Overall transfer level: Needs assistance Equipment used: None Transfers: Sit to/from Stand Sit to Stand: Modified independent (Device/Increase time)              Balance Overall balance assessment: Needs assistance Sitting-balance support: Feet supported Sitting  balance-Leahy Scale: Good     Standing balance support: No upper extremity supported Standing balance-Leahy Scale: Fair Standing balance comment: no LOB episodes noted                            ADL either performed or assessed with clinical judgement   ADL Overall ADL's : At baseline;Modified independent                                       General ADL Comments: Pt advised to sit down for LB Dressing as pt unsafely attempting to donn shoes in standing; pt sat down and able to perform LB ADL. Pt stood at sink for grooming with no LOB episodes. Pt modified independent for sit to stands from bed, couch and commode. Pt ambulating in room with no difficulty. 90% O2 to begin session on RA, but unable to continue to get a good reading  on pulse ox. Pt reported to not feel SOB.     Vision Baseline Vision/History: No visual deficits Patient Visual Report: No change from baseline Vision Assessment?: No apparent visual deficits     Perception     Praxis      Pertinent Vitals/Pain Pain Assessment: No/denies pain     Hand Dominance Right   Extremity/Trunk Assessment Upper Extremity Assessment Upper Extremity Assessment: Overall  WFL for tasks assessed   Lower Extremity Assessment Lower Extremity Assessment: Overall WFL for tasks assessed   Cervical / Trunk Assessment Cervical / Trunk Assessment: Normal   Communication Communication Communication: No difficulties   Cognition Arousal/Alertness: Awake/alert Behavior During Therapy: Impulsive Overall Cognitive Status: Impaired/Different from baseline Area of Impairment: Safety/judgement                         Safety/Judgement: Decreased awareness of safety;Decreased awareness of deficits     General Comments: Pt noncompliant with medical advice per chart. Pt agreeable to session, but questioning "what does this have to do with my heart and breathing,"   General Comments  Unable to get  current O2 level. Pt wanting to sit upright for meal.    Exercises     Shoulder Instructions      Home Living Family/patient expects to be discharged to:: Private residence Living Arrangements: Spouse/significant other;Children Available Help at Discharge: Family;Available 24 hours/day;Available PRN/intermittently(son)   Home Access: Stairs to enter Entrance Stairs-Number of Steps: 3 Entrance Stairs-Rails: None Home Layout: One level     Bathroom Shower/Tub: Teacher, early years/pre: Standard     Home Equipment: None          Prior Functioning/Environment Level of Independence: Independent        Comments: pt reports still working        OT Problem List: Decreased strength;Decreased activity tolerance;Decreased safety awareness      OT Treatment/Interventions:      OT Goals(Current goals can be found in the care plan section) Acute Rehab OT Goals Patient Stated Goal: to go home  OT Frequency:     Barriers to D/C:            Co-evaluation              AM-PAC OT "6 Clicks" Daily Activity     Outcome Measure Help from another person eating meals?: None Help from another person taking care of personal grooming?: None Help from another person toileting, which includes using toliet, bedpan, or urinal?: None Help from another person bathing (including washing, rinsing, drying)?: A Little Help from another person to put on and taking off regular upper body clothing?: None Help from another person to put on and taking off regular lower body clothing?: None 6 Click Score: 23   End of Session Nurse Communication: Mobility status  Activity Tolerance: Patient tolerated treatment well Patient left: in bed;with call bell/phone within reach;with family/visitor present  OT Visit Diagnosis: Unsteadiness on feet (R26.81);Muscle weakness (generalized) (M62.81)                Time: 0272-5366 OT Time Calculation (min): 20 min Charges:  OT General  Charges $OT Visit: 1 Visit OT Evaluation $OT Eval Moderate Complexity: 1 Mod  Jefferey Pica, OTR/L Acute Rehabilitation Services Pager: [phone] Office: [phone]   IRJJOAC C 09/03/2019, 5:16 PM

## 2019-09-03 NOTE — Progress Notes (Signed)
Hospitalist progress note   Patient from home, Patient going likely home, Dispo discharge when able and when cleared by advanced heart failure team  Ray Smith [id number] DOB: 1945/11/13 DOA: 09/02/2019  PCP: Patient, No Pcp Per   Narrative:  69 M STEMI CAD refused to CABG 2015-eventual PCI 12/29/2017--01/02/2018, HFrEF EF 25-30%-chronic noncompliance-continued smoker with presumed COPD-prior admission 09/979 metabolic encephalopathy supposedly secondary to opiates Admitted 09/03/2018 1S OB, O2 sat 86% needing 15 L on nonrebreather Given magnesium Solu-Medrol en route CXR = mild congestion interstitial edema Down titrated to 3 L of nasal cannula   Data Reviewed:  WBC 10.7, hemoglobin 14.7, platelet 284 Magnesium 2.1 Troponin trend 37--50--37, BNP 2357, lactic acid 1.1  Assessment & Plan:  Decompensated acute HFrEF with acute hypoxic failure likely secondary to this Diuresis Lasix 40 twice daily IV-echo was supposed to be performed at 03/2019 office visit-does not appear was done-repeat here Weight in 2019 and current weight seem concordant and the same, strict I/O Further management as per cardiology Chronic noncompliance Documented noncompliance last hospital stay was only taking aspirin Plavix-defer to cardiology best medications PCI 2019 Continue aspirin Plavix Smoker, COPD Gold stage A No oxygen requirement not on inhalers at home We will continue to counsel regarding cessation   Subjective: Patient asking when he can go home Tells me he was trying to get things out of a dumpster yesterday and they weighed 200 pounds which is why he came in Thanks plan on discussing with him that heart failure came from that Seems quite upset that primary care physician did not tell him he has COPD Overall he has a list of complaints which are not consistent with his current medical issues and it is difficult to keep him on track Finally asked me for something for "nerves"  Consultants:    Cardiology  Objective: Vitals:   09/03/19 0145 09/03/19 0200 09/03/19 0230 09/03/19 0428  BP: 117/85 130/84 112/69 132/65  Pulse: 76 76 73 76  Resp:    19  Temp:    97.8 F (36.6 C)  TempSrc:    Oral  SpO2: 96% 98% 98% 99%  Weight:    54.8 kg  Height:        Intake/Output Summary (Last 24 hours) at 09/03/2019 0734 Last data filed at 09/03/2019 0553 Gross per 24 hour  Intake 240 ml  Output -  Net 240 ml   Filed Weights   09/02/19 1855 09/03/19 0428  Weight: 55 kg 54.8 kg    Examination: Awake coherent somewhat agitated and does not want to stay S1-S2 no murmur rub or gallop Chest clear no added sound no rales no rhonchi No lower extremity edema Neurologically intact no focal deficit power 5/5 no focal deficit Abdomen soft no rebound no guarding  Scheduled Meds: . aspirin EC  81 mg Oral Daily  . atorvastatin  80 mg Oral q1800  . budesonide (PULMICORT) nebulizer solution  0.25 mg Nebulization BID  . carvedilol  3.125 mg Oral BID WC  . clopidogrel  75 mg Oral Daily  . enoxaparin (LOVENOX) injection  40 mg Subcutaneous Q24H  . furosemide  40 mg Intravenous BID  . guaiFENesin  600 mg Oral BID  . ipratropium-albuterol  3 mL Nebulization Q6H  . predniSONE  40 mg Oral Q breakfast  . sodium chloride flush  3 mL Intravenous Q12H   Continuous Infusions: . sodium chloride       LOS: 1 day   Time spent: 40 Verneita Griffes, MD  [facility]  09/03/2019, 7:34 AM

## 2019-09-03 NOTE — Progress Notes (Signed)
RN got a call from [address] and they stated that the patient is having bigemeny runs. RN paged MD with update and sent Didier, NT into the room to obtain an EKG but the pt refused. Will notify the MD

## 2019-09-04 LAB — BASIC METABOLIC PANEL
Anion gap: 14 (ref 5–15)
BUN: 36 mg/dL — ABNORMAL HIGH (ref 8–23)
CO2: 25 mmol/L (ref 22–32)
Calcium: 8.9 mg/dL (ref 8.9–10.3)
Chloride: 99 mmol/L (ref 98–111)
Creatinine, Ser: 1.62 mg/dL — ABNORMAL HIGH (ref 0.61–1.24)
GFR calc Af Amer: 47 mL/min — ABNORMAL LOW (ref >60)
GFR calc non Af Amer: 41 mL/min — ABNORMAL LOW (ref >60)
Glucose, Bld: 106 mg/dL — ABNORMAL HIGH (ref 70–99)
Potassium: 3.6 mmol/L (ref 3.5–5.1)
Sodium: 138 mmol/L (ref 135–145)

## 2019-09-04 LAB — MAGNESIUM: Magnesium: 2.2 mg/dL (ref 1.7–2.4)

## 2019-09-04 LAB — LIPID PANEL
Cholesterol: 237 mg/dL — ABNORMAL HIGH (ref 0–200)
HDL: 48 mg/dL (ref >40)
LDL Cholesterol: 164 mg/dL — ABNORMAL HIGH (ref 0–99)
Total CHOL/HDL Ratio: 4.9 RATIO
Triglycerides: 126 mg/dL (ref <150)
VLDL: 25 mg/dL (ref 0–40)

## 2019-09-04 MED ORDER — ALBUTEROL SULFATE HFA 108 (90 BASE) MCG/ACT IN AERS: 2.0000 | INHALATION_SPRAY | Freq: Four times a day (QID) | RESPIRATORY_TRACT | 1 refills | Status: DC | PRN

## 2019-09-04 MED ORDER — CARVEDILOL 3.125 MG PO TABS: 3.1250 mg | ORAL_TABLET | Freq: Two times a day (BID) | ORAL | 11 refills | Status: DC

## 2019-09-04 MED ORDER — ATORVASTATIN CALCIUM 80 MG PO TABS: 80.0000 mg | ORAL_TABLET | Freq: Every day | ORAL | 1 refills | Status: DC

## 2019-09-04 MED ORDER — NITROGLYCERIN 0.4 MG SL SUBL: 0.4000 mg | SUBLINGUAL_TABLET | SUBLINGUAL | 2 refills | Status: DC | PRN

## 2019-09-04 MED ORDER — FUROSEMIDE 10 MG/ML IJ SOLN
40.0000 mg | Freq: Two times a day (BID) | INTRAMUSCULAR | Status: DC
  Administered 2019-09-04: 40 mg via INTRAVENOUS
  Filled 2019-09-04: qty 4

## 2019-09-04 MED ORDER — CLOPIDOGREL BISULFATE 75 MG PO TABS: 75.0000 mg | ORAL_TABLET | Freq: Every day | ORAL | 12 refills | Status: DC

## 2019-09-04 MED ORDER — ASPIRIN 81 MG PO TBEC: 81.0000 mg | DELAYED_RELEASE_TABLET | Freq: Every day | ORAL | 12 refills | Status: DC

## 2019-09-04 MED ORDER — POTASSIUM CHLORIDE CRYS ER 20 MEQ PO TBCR
40.0000 meq | EXTENDED_RELEASE_TABLET | Freq: Once | ORAL | Status: AC
  Administered 2019-09-04: 40 meq via ORAL
  Filled 2019-09-04: qty 2

## 2019-09-04 MED ORDER — PANTOPRAZOLE SODIUM 40 MG PO TBEC: 40.0000 mg | DELAYED_RELEASE_TABLET | Freq: Every day | ORAL | 1 refills | Status: DC

## 2019-09-04 MED ORDER — ALBUMIN HUMAN 25 % IV SOLN
25.0000 g | Freq: Once | INTRAVENOUS | Status: AC
  Administered 2019-09-04: 25 g via INTRAVENOUS
  Filled 2019-09-04: qty 100

## 2019-09-04 MED ORDER — LIVING BETTER WITH HEART FAILURE BOOK: Freq: Once | Status: AC

## 2019-09-04 MED ORDER — FUROSEMIDE 40 MG PO TABS: 40.0000 mg | ORAL_TABLET | ORAL | 3 refills | Status: DC

## 2019-09-04 NOTE — Progress Notes (Addendum)
Progress Note  Patient Name: Ray Smith Date of Encounter: 09/04/2019  Primary Cardiologist: Sinclair Grooms, MD   Subjective   Overnight events reviewed - patient with asymptomatic hypotension early this morning. BP in the 60s/50s, improved on repeat.  Patient reports feeling back to baseline this morning. No complaints of SOB, chest pain, palpitations, dizziness, lightheadedness, or syncope. We had a lengthy discussion about his CHF and CAD history. Educated him on the nature of his heart failure and importance of maintaining a low sodium diet, limiting fluid intake, and monitoring his weight daily. We talked about why his is on carvedilol and how this medication works to support his heart function. We talked about his CAD and need for long term DAPT, as well as cholesterol medications. He is very eager to go home today and reports he will sign himself out if not discharge. I asked for a little more time today to finalize discharge recommendations, to which he was agreeable.   Inpatient Medications    Scheduled Meds: . aspirin EC  81 mg Oral Daily  . atorvastatin  80 mg Oral q1800  . budesonide (PULMICORT) nebulizer solution  0.25 mg Nebulization BID  . carvedilol  3.125 mg Oral BID WC  . clopidogrel  75 mg Oral Daily  . enoxaparin (LOVENOX) injection  40 mg Subcutaneous Q24H  . furosemide  40 mg Intravenous BID  . guaiFENesin  600 mg Oral BID  . ipratropium-albuterol  3 mL Nebulization TID  . pantoprazole  40 mg Oral QHS  . predniSONE  40 mg Oral Q breakfast  . sacubitril-valsartan  1 tablet Oral BID  . sodium chloride flush  3 mL Intravenous Q12H   Continuous Infusions: . sodium chloride     PRN Meds: sodium chloride, acetaminophen, albuterol, sodium chloride flush   Vital Signs    Vitals:   09/04/19 0358 09/04/19 0400 09/04/19 0600 09/04/19 0749  BP: (!) 75/48 (!) 71/60 (!) 81/66 (!) 94/50  Pulse:  75 67   Resp:      Temp:  97.6 F (36.4 C)    TempSrc:  Oral     SpO2:      Weight:      Height:        Intake/Output Summary (Last 24 hours) at 09/04/2019 0751 Last data filed at 09/04/2019 0600 Gross per 24 hour  Intake 1346 ml  Output 2400 ml  Net -1054 ml   Filed Weights   09/02/19 1855 09/03/19 0428  Weight: 55 kg 54.8 kg    Telemetry    Sinus rhythm with frequent ectopy - Personally Reviewed  ECG    No new tracings - Personally Reviewed  Physical Exam   GEN: Thin elderly gentleman sitting upright in bed in no acute distress.   Neck: No JVD, no carotid bruits Cardiac: distant heart sounds - RRR, no obvious murmurs, rubs, or gallops.  Respiratory: Clear to auscultation bilaterally, no wheezes/ rales/ rhonchi GI: NABS, Soft, nontender, non-distended  MS: No edema; No deformity. Neuro:  Nonfocal, moving all extremities spontaneously Psych: Normal affect   Labs    Chemistry Recent Labs  Lab 09/02/19 1920  NA 143  K 4.2  CL 108  CO2 23  GLUCOSE 142*  BUN 12  CREATININE 1.14  CALCIUM 9.0  GFRNONAA >60  GFRAA >60  ANIONGAP 12     Hematology Recent Labs  Lab 09/02/19 1920  WBC 10.7*  RBC 5.07  HGB 14.7  HCT 47.3  MCV 93.3  MCH 29.0  MCHC 31.1  RDW 15.3  PLT 284    Cardiac EnzymesNo results for input(s): TROPONINI in the last 168 hours. No results for input(s): TROPIPOC in the last 168 hours.   BNP Recent Labs  Lab 09/02/19 1920  BNP 2,357.3*     DDimer No results for input(s): DDIMER in the last 168 hours.   Radiology    DG Chest Port 1 View  Result Date: 09/02/2019 CLINICAL DATA:  Shortness of breath EXAM: PORTABLE CHEST 1 VIEW COMPARISON:  03/18/2018 FINDINGS: Cardiac shadow is stable. Aortic calcifications are again seen and stable. Mild vascular congestion is noted with interstitial edema. No sizable effusion is seen. No bony abnormality is noted. IMPRESSION: Mild CHF. Electronically Signed   By: Alcide Clever M.D.   On: 09/02/2019 20:28   ECHOCARDIOGRAM COMPLETE  Result Date: 09/03/2019     ECHOCARDIOGRAM REPORT   Patient Name:   Ray Smith Date of Exam: 09/03/2019 Medical Rec #:  [id number]     Height:       70.0 in Accession #:    [id number]    Weight:       120.8 lb Date of Birth:  1946/05/22     BSA:          1.685 m Patient Age:    74 years      BP:           132/65 mmHg Patient Gender: M             HR:           67 bpm. Exam Location:  Inpatient Procedure: 2D Echo, Cardiac Doppler and Color Doppler Indications:    I50.23 Acute on chronic systolic (congestive) heart failure  History:        Patient has prior history of Echocardiogram examinations, most                 recent 01/01/2018. CAD and Previous Myocardial Infarction, COPD;                 Risk Factors:Hypertension, Dyslipidemia and Current Smoker.  Sonographer:    Elmarie Shiley Dance Referring Phys: [id number] VASUNDHRA RATHORE IMPRESSIONS  1. Left ventricular ejection fraction, by estimation, is 25 to 30%. The left ventricle has severely decreased function. The left ventricle demonstrates global hypokinesis. There is moderate left ventricular hypertrophy. Left ventricular diastolic parameters are consistent with Grade I diastolic dysfunction (impaired relaxation). Elevated left ventricular end-diastolic pressure. There is severe akinesis of the left ventricular, entire apical segment, anteroseptal wall, anterior wall and distal inferoapical.  2. Right ventricular systolic function is normal. The right ventricular size is normal.  3. The mitral valve is grossly normal. Trivial mitral valve regurgitation.  4. The aortic valve is tricuspid. Aortic valve regurgitation is not visualized. Comparison(s): No significant change from prior study. 01/01/2018: 25-30%. FINDINGS  Left Ventricle: Left ventricular ejection fraction, by estimation, is 25 to 30%. The left ventricle has severely decreased function. The left ventricle demonstrates global hypokinesis. Severe akinesis of the left ventricular, entire apical segment, anteroseptal wall, anterior wall and  distal inferoapical. The left ventricular internal cavity size was normal in size. There is moderate left ventricular hypertrophy. Left ventricular diastolic parameters are consistent with Grade I diastolic dysfunction (impaired relaxation). Elevated left ventricular end-diastolic pressure. Right Ventricle: The right ventricular size is normal. No increase in right ventricular wall thickness. Right ventricular systolic function is normal. Left Atrium: Left atrial size was normal in size. Right Atrium:  Right atrial size was normal in size. Pericardium: There is no evidence of pericardial effusion. Mitral Valve: The mitral valve is grossly normal. Trivial mitral valve regurgitation. Tricuspid Valve: The tricuspid valve is grossly normal. Tricuspid valve regurgitation is trivial. Aortic Valve: The aortic valve is tricuspid. Aortic valve regurgitation is not visualized. Pulmonic Valve: The pulmonic valve was normal in structure. Pulmonic valve regurgitation is not visualized. Aorta: The aortic root, ascending aorta, aortic arch and descending aorta are all structurally normal, with no evidence of dilitation or obstruction. IAS/Shunts: No atrial level shunt detected by color flow Doppler.  LEFT VENTRICLE PLAX 2D LVIDd:         4.50 cm  Diastology LVIDs:         3.60 cm  LV e' lateral:   7.94 cm/s LV PW:         1.20 cm  LV E/e' lateral: 6.4 LV IVS:        1.20 cm  LV e' medial:    6.96 cm/s LVOT diam:     2.10 cm  LV E/e' medial:  7.3 LV SV:         37 LV SV Index:   22 LVOT Area:     3.46 cm  RIGHT VENTRICLE            IVC RV Basal diam:  2.20 cm    IVC diam: 1.70 cm RV S prime:     9.03 cm/s TAPSE (M-mode): 1.7 cm LEFT ATRIUM         Index LA diam:    3.80 cm 2.26 cm/m  AORTIC VALVE LVOT Vmax:   70.90 cm/s LVOT Vmean:  42.600 cm/s LVOT VTI:    0.106 m  AORTA Ao Root diam: 4.10 cm Ao Asc diam:  3.70 cm MITRAL VALVE MV Area (PHT): 2.87 cm     SHUNTS MV Decel Time: 264 msec     Systemic VTI:  0.11 m MV E velocity:  50.80 cm/s   Systemic Diam: 2.10 cm MV A velocity: 134.00 cm/s MV E/A ratio:  0.38 Zoila Shutter MD Electronically signed by Zoila Shutter MD Signature Date/Time: 09/03/2019/12:08:21 PM    Final     Cardiac Studies   Echocardiogram 09/03/19: 1. Left ventricular ejection fraction, by estimation, is 25 to 30%. The  left ventricle has severely decreased function. The left ventricle  demonstrates global hypokinesis. There is moderate left ventricular  hypertrophy. Left ventricular diastolic  parameters are consistent with Grade I diastolic dysfunction (impaired  relaxation). Elevated left ventricular end-diastolic pressure. There is  severe akinesis of the left ventricular, entire apical segment,  anteroseptal wall, anterior wall and distal  inferoapical.  2. Right ventricular systolic function is normal. The right ventricular  size is normal.  3. The mitral valve is grossly normal. Trivial mitral valve  regurgitation.  4. The aortic valve is tricuspid. Aortic valve regurgitation is not  visualized.   Comparison(s): No significant change from prior study. 01/01/2018: 25-30%.   Left heart catheterization 12/2017:  Mid RCA lesion is 70% stenosed.  Mid LM to Dist LM lesion is 90% stenosed.  Ost LAD to Prox LAD lesion is 100% stenosed.  Ost Cx to Prox Cx lesion is 90% stenosed.  Prox Cx to Mid Cx lesion is 50% stenosed.  Prox LAD to Mid LAD lesion is 40% stenosed.  Mid LAD lesion is 50% stenosed.  Ost 1st Diag to 1st Diag lesion is 40% stenosed.  A drug-eluting stent was successfully placed using a  STENT SIERRA 3.50 X 33 MM.  Post intervention, there is a 0% residual stenosis.  Post intervention, there is a 0% residual stenosis.  A drug-eluting stent was successfully placed using a STENT SIERRA 3.50 X 23 MM.  Post intervention, there is a 0% residual stenosis.  There is severe left ventricular systolic dysfunction.  LV end diastolic pressure is normal.  The left  ventricular ejection fraction is 25-35% by visual estimate.   1.  Severe distal left main stenosis 2.  Acute total occlusion of the proximal LAD 3.  Severe ostial left circumflex stenosis 4.  Moderate mid RCA stenosis 5.  Severe segmental LV systolic dysfunction consistent with large anterolateral infarction with LVEF estimated at 25% 6.  Successful complex PCI of the left mainstem/proximal LAD/proximal circumflex as outlined above  Recommend dual antiplatelet therapy with Aspirin 81mg  daily and Clopidogrel 75mg  daily long-term (beyond 12 months) because of complex left main bifurcation PCI in the setting of anterior MI.  Patient Profile      74 y.o. male with a hx of CAD s/p complex PCI to LM bifurcation in 2019, chronic combined CHF, ischemic cardiomyopathy, HTN, HLD, tobacco abuse with presumed COPD, and non-compliance, who is being followed by cardiology for the evaluation of CHF.   Assessment & Plan    1. Acute on chronic combined CHF: suspected ischemic cardiomyopathy, persistent from 2019. Echo this admission with EF 25-30%, G1DD, global hypokinesis with severe akinesis of the LV, entire apical segment, anteroseptal wall, anterior wall, and distal inferoapical wall, with no significant valvular abnormalities. BNP elevated to 2300s. He was started on BID IV lasix with UOP net -1.0L in the past 24 hours and -2020 this admission. Weight is only down 1lb from yesterday at 120lbs today. He was started on entresto yesterday, in addition to previously prescribed carvedilol for GDT. Cr is up to 1.6 today from 1.14 yesterday (baseline).  - Can stop IV lasix today - favor prn lasix dosing at home for weight gain of 3lbs in 1 day, 5lbs in 1 week, or worsening SOB. - Continue carvedilol 3.125mg  BID - Will hold entresto given hypotension overnight and bump in Cr - could consider starting outpatient if BP and Cr will tolerate - Could consider addition of spironolactone outpatient if BP/Cr will  tolerate  - Continue to monitor strict I&Os and daily weights - Continue to monitor electrolytes and replete to maintain K>4, Mg >2  2. CAD s/p complex PCI to LM bifurcation in 2019: trop mildly elevated with flat trend not c/w ACS. EKG non-ischemic. Unclear if he has been on DAPT with aspirin and plavix despite recommended long term use given PCI location.  - Continue aspirin, plavix, and statin - Continue carvedilol - Ensure up to date prn SL nitro Rx at discharge  3. HTN: BP in the 60s/50s this morning. Surprisingly he was asymptomatic. Started on entresto yesterday for management of #1.  - Will hold entresto given hypotension overnight - Continue carvedilol  4. HLD: LDL 130 2019 - Will add on lipid panel to AM labs - Continue atorvastatin 80mg  daily  5. COPD: on supportive care with steroids, nebulizers, and inhalers for possible exacerbation.  - Continue management per primary team.    Importance of medication compliance stressed. He seems agreeable after lengthy discussion today. He asks to follow with Dr. after discharge.   For questions or updates, please contact [facility] Please consult [email or web address] for contact info under Cardiology/STEMI.      Signed, 2020.  Kroeger, PA-C  09/04/2019, 7:51 AM   [phone]  Personally seen and examined. Agree with above.   Feeling better, desiring to go home.  Acute on chronic systolic heart failure secondary to ischemic cardiomyopathy -After adequate diuresis, improved. -I tried to add Entresto low-dose yesterday however he is not able to tolerate this secondary to hypotension. -We will continue with low-dose carvedilol 3.125 mg if blood pressure greater than 90 systolic. -Okay to use as needed Lasix 20 mg.  Tobacco use -Continue to encourage cessation  COPD exacerbation -Received Solu-Medrol, prior productive cough.  Severe coronary disease -Left main stenosis acute total occlusion of proximal LAD ostial  left circumflex stenosis  Protein calorie malnutrition -BMI 17 continue to encourage protein use.  OK for DC. Will set up follow up.   Donato Schultz, MD

## 2019-09-04 NOTE — Progress Notes (Signed)
Physical Therapy Treatment Patient Details Name: Ray Smith MRN: [id number] DOB: 03-May-1946 Today's Date: 09/04/2019    History of Present Illness Ray Smith is a 74 y.o. male with medical history significant of COPD on home oxygen per chart but patient reports not using it, CAD, chronic systolic CHF with EF 25 to 30% on echo done July 2019, hypertension, hyperlipidemia, tobacco use presenting to the ED with complaints of shortness of breath.    PT Comments    Patient making steady progress with PT this session. He continues to be slightly impulsive with mobility and has decreased safety awareness. PT was non-compliant with use of supplemental O2 during session despite education on benefits for use during mobility. He ambulated ~300' today with no device and min guard for safety as he remains unsteady. Pt's SpO2 dropped from 93% to 78-82% on RA after ambulation and 3L/min donned with return to 92% at rest. Time spent educating pt on on functional LE strengthening and on precautions and safety concerns with non-compliance of O2 use. Despite education pt continues to state "I'm not going to wear it" and states he does not need AD to mobilize. Acute PT will continue to progress mobility with pt as able.   Follow Up Recommendations  Outpatient PT;Supervision/Assistance - 24 hour(however pt denying he needs any therapy s/p d/c)     Equipment Recommendations  (would benefit from [facility] however resistant )    Recommendations for Other Services       Precautions / Restrictions Precautions Precautions: Fall Precaution Comments: pt needs to be on O2 but doesn't comply Restrictions Weight Bearing Restrictions: No    Mobility  Bed Mobility Overal bed mobility: Modified Independent             General bed mobility comments: no difficulty, HOB elevated, no use of bed rail  Transfers Overall transfer level: Needs assistance Equipment used: None Transfers: Sit to/from Stand Sit to  Stand: Supervision         General transfer comment: pt steady with sit<>stand from EOB. slightly impulsive and pt educated on safety concerns.  Ambulation/Gait Ambulation/Gait assistance: Min guard Gait Distance (Feet): 300 Feet Assistive device: None(pt refused to push IV pole) Gait Pattern/deviations: Decreased stride length;Staggering right;Drifts right/left;Step-through pattern;Scissoring Gait velocity: wfl Gait velocity interpretation: >2.62 ft/sec, indicative of community ambulatory General Gait Details: pt refusing to use O2, use AD for gait, or allow therapist to don gait belt despite educated and concern for safety due to balance impairments. Pt stating "My balance is fine, it's been fine". Pt SpO2 at 93% on RA at start of gait and decreased to 82% after ambulation.    Stairs             Wheelchair Mobility    Modified Rankin (Stroke Patients Only)       Balance Overall balance assessment: Needs assistance Sitting-balance support: Feet supported Sitting balance-Leahy Scale: Good     Standing balance support: No upper extremity supported Standing balance-Leahy Scale: Good Standing balance comment: pt with good static standing balance, and fair-good balance with gait. Pt mildly unsteady with no AD but able to correct balance without assist. No overt LOB noted during mobility.         Cognition Arousal/Alertness: Awake/alert Behavior During Therapy: Impulsive Overall Cognitive Status: Impaired/Different from baseline Area of Impairment: Safety/judgement        Safety/Judgement: Decreased awareness of safety;Decreased awareness of deficits     General Comments: suspect this to be personality/baseline. Pt non-compliant  with O2 despite SOB. Pt not willing to wear O2 or gait belt for ambulation this session. "I'm not gonna let you lead me around like a dog". Pt is mildly unsteady during gait and SpO2 dropped to low 80's while on RA but pt denied SOB.       Exercises Other Exercises Other Exercises: Repeated Sit<>Stands: 1x 10 reps, no overt LOB, no assist needed, and pt using momentum to complete.     General Comments General comments (skin integrity, edema, etc.): SpO2 decreased to 78-82 after gait and pt educated on pursed lip breathing to facilitate increased O2. No significant improvement in SpO2 with pursed lip breathing and 3L/min donned at EOB and SpO2 increased to low 90's. Despite education on benefits of supplemental O2 and pt stating "I'm not going to wear it at home, I'm not gonna lie to you, and I'm not gonna wear it".      Pertinent Vitals/Pain Pain Assessment: No/denies pain           PT Goals (current goals can now be found in the care plan section) Acute Rehab PT Goals Patient Stated Goal: to go home today PT Goal Formulation: With patient Time For Goal Achievement: 09/17/19 Potential to Achieve Goals: Good Progress towards PT goals: Progressing toward goals(mobility progressing, decreased safety awareness)    Frequency    Min 3X/week      PT Plan Current plan remains appropriate       AM-PAC PT "6 Clicks" Mobility   Outcome Measure  Help needed turning from your back to your side while in a flat bed without using bedrails?: None Help needed moving from lying on your back to sitting on the side of a flat bed without using bedrails?: None Help needed moving to and from a bed to a chair (including a wheelchair)?: None Help needed standing up from a chair using your arms (e.g., wheelchair or bedside chair)?: None Help needed to walk in hospital room?: A Little Help needed climbing 3-5 steps with a railing? : A Little 6 Click Score: 22    End of Session Equipment Utilized During Treatment: Gait belt;Oxygen(Oxygen on 3L/min at EOS) Activity Tolerance: Patient tolerated treatment well Patient left: in bed;with call bell/phone within reach Nurse Communication: Mobility status PT Visit Diagnosis: Unsteadiness  on feet (R26.81);Muscle weakness (generalized) (M62.81);Difficulty in walking, not elsewhere classified (R26.2)     Time: 8657-8469 PT Time Calculation (min) (ACUTE ONLY): 34 min  Charges:  $Gait Training: 8-22 mins $Therapeutic Exercise: 8-22 mins                     Wynn Maudlin, DPT Physical Therapist with [facility] [phone]  09/04/2019 11:04 AM

## 2019-09-04 NOTE — Progress Notes (Signed)
Md made aware of pts BP 85/62, P 71. Pt was asleep and asymptomatic, will cont to monitor.

## 2019-09-04 NOTE — Progress Notes (Signed)
Discharge teaching complete. Meds, diet, activity, follow up appointments reviewed and all questions answered. Copy of instructions given to patient and prescriptions sent to pharmacy.  

## 2019-09-04 NOTE — Discharge Summary (Signed)
Physician Discharge Summary  Ray Smith QMG:[id number] DOB: [birth year] DOA: 09/02/2019  PCP: Patient, No Pcp Per  Admit date: 09/02/2019 Discharge date: 09/04/2019  Time spent: 25 minutes  Recommendations for Outpatient Follow-up:  1. Meds refilled as per recommendations--favor Lasix every other day to ensure not rehospitalized 2. Rx Coreg Rx aspirin Rx Plavix Rx nitroglycerin 3. Needs outpatient follow-up with PCP regarding arm pain and work-up of the same given he has weakness in both arms 4. Need continued cessation counseling for tobacco 5.   Discharge Diagnoses:  Principal Problem:   Acute exacerbation of CHF (congestive heart failure) (HCC) Active Problems:   Coronary Artery Disease   Hypertension   COPD with acute exacerbation ([place])   Acute respiratory failure [facility])   Discharge Condition: Improved  Diet recommendation: Heart healthy low-salt  Filed Weights   09/02/19 1855 09/03/19 0428  Weight: 55 kg 54.8 kg    History of present illness:  33 M STEMI CAD refused to CABG 2015-eventual PCI 12/29/2017--01/02/2018, HFrEF EF 25-30%-chronic noncompliance-continued smoker with presumed COPD-prior admission 08/2669 metabolic encephalopathy supposedly secondary to opiates Admitted 09/03/2018 1S OB, O2 sat 86% needing 15 L on nonrebreather Given magnesium Solu-Medrol en route CXR = mild congestion interstitial edema Down titrated to 3 L of nasal cannula Cardiology consulted to assist with management-  Hospital Course:  Decompensated acute HFrEF/HFpEF + acute hypoxic failure Hypotension secondary to aggressive diuresis Diuresis was increased to 3/10 to Lasix 80 twice daily but slightly hypotensive-stop Entresto-diuresis with Lasix 40 p.o. every other day or if gains weight or feel short of breath Meds simplified and compliance encouraged IV-echo as above 3/10- Documented noncompliance last hospital stay was only taking aspirin Plavix suspect compliance will be an issue PCI  2019 Continue aspirin Plavix Smoker, COPD Gold stage A No oxygen requirement not on inhalers at home Given short burst of steroids Given albuterol inhaler on discharge BMI 17.3 Independent risk factor mortality-has always been somewhat small built from records Reflux Prescribed pantoprazole on discharge   Procedures: Echocardiogram LVEF 25-30% severe global hypokinesis with grade 1 diastolic dysfunction  Consultations:  Cardiology  Discharge Exam: Vitals:   09/04/19 0749 09/04/19 0847  BP: (!) 94/50   Pulse:    Resp:    Temp:    SpO2:  98%    General: Awake alert coherent no distress Cardiovascular: S1-S2 no murmur rub or gallop no rales no rhonchi Respiratory: Clinically clear Abdomen soft no rebound no guarding  Discharge Instructions   Discharge Instructions    Diet - low sodium heart healthy   Complete by: As directed    Discharge instructions   Complete by: As directed    Take your medications without fail--make sure that u obtain your meds from your [facility]   Increase activity slowly   Complete by: As directed      Allergies as of 09/04/2019      Reactions   Tramadol Anaphylaxis   Flexeril [cyclobenzaprine] Other (See Comments)   Per patient "it made my heart stop"   Ibuprofen Other (See Comments)   Overuse damages pt's kidneys   Lactose Intolerance (gi) Other (See Comments)   Digestive issues      Medication List    STOP taking these medications   ascorbic acid 500 MG tablet Commonly known as: VITAMIN C   feeding supplement (ENSURE ENLIVE) Liqd   folic acid 1 MG tablet Commonly known as: FOLVITE   spironolactone 25 MG tablet Commonly known as: ALDACTONE   sucralfate 1 GM/10ML  suspension Commonly known as: CARAFATE   zinc sulfate 220 (50 Zn) MG capsule     TAKE these medications   albuterol 108 (90 Base) MCG/ACT inhaler Commonly known as: VENTOLIN HFA Inhale 2 puffs into the lungs every 6 (six) hours as needed for wheezing or  shortness of breath.   aspirin 81 MG EC tablet Take 1 tablet (81 mg total) by mouth daily.   atorvastatin 80 MG tablet Commonly known as: LIPITOR Take 1 tablet (80 mg total) by mouth daily at 6 PM.   carvedilol 3.125 MG tablet Commonly known as: COREG Take 1 tablet (3.125 mg total) by mouth 2 (two) times daily with a meal. What changed: when to take this   clopidogrel 75 MG tablet Commonly known as: PLAVIX Take 1 tablet (75 mg total) by mouth daily.   furosemide 40 MG tablet Commonly known as: Lasix Take 1 tablet (40 mg total) by mouth every other day. If u feel short of breath, you may take daily for 3 days then go back to every other day   nitroGLYCERIN 0.4 MG SL tablet Commonly known as: NITROSTAT Place 1 tablet (0.4 mg total) under the tongue every 5 (five) minutes x 3 doses as needed for chest pain.   pantoprazole 40 MG tablet Commonly known as: PROTONIX Take 1 tablet (40 mg total) by mouth at bedtime.      Allergies  Allergen Reactions  . Tramadol Anaphylaxis  . Flexeril [Cyclobenzaprine] Other (See Comments)    Per patient "it made my heart stop"  . Ibuprofen Other (See Comments)    Overuse damages pt's kidneys   . Lactose Intolerance (Gi) Other (See Comments)    Digestive issues   Follow-up Information    Filbert Schilder, NP Follow up on 09/19/2019.   Specialty: Cardiology Why: Please arrive 15 minutes early for your 11:15am post-hospital cardiology appointment Contact information: [address] [place] [address] [phone]            The results of significant diagnostics from this hospitalization (including imaging, microbiology, ancillary and laboratory) are listed below for reference.    Significant Diagnostic Studies: DG Chest Port 1 View  Result Date: 09/02/2019 CLINICAL DATA:  Shortness of breath EXAM: PORTABLE CHEST 1 VIEW COMPARISON:  03/18/2018 FINDINGS: Cardiac shadow is stable. Aortic calcifications are again seen and  stable. Mild vascular congestion is noted with interstitial edema. No sizable effusion is seen. No bony abnormality is noted. IMPRESSION: Mild CHF. Electronically Signed   By: Alcide Clever M.D.   On: 09/02/2019 20:28   ECHOCARDIOGRAM COMPLETE  Result Date: 09/03/2019    ECHOCARDIOGRAM REPORT   Patient Name:   Ray Smith Date of Exam: 09/03/2019 Medical Rec #:  [id number]     Height:       70.0 in Accession #:    [id number]    Weight:       120.8 lb Date of Birth:  01/12/1946     BSA:          1.685 m Patient Age:    74 years      BP:           132/65 mmHg Patient Gender: M             HR:           67 bpm. Exam Location:  Inpatient Procedure: 2D Echo, Cardiac Doppler and Color Doppler Indications:    I50.23 Acute on chronic systolic (congestive) heart  failure  History:        Patient has prior history of Echocardiogram examinations, most                 recent 01/01/2018. CAD and Previous Myocardial Infarction, COPD;                 Risk Factors:Hypertension, Dyslipidemia and Current Smoker.  Sonographer:    Elmarie Shiley Dance Referring Phys: [id number] VASUNDHRA RATHORE IMPRESSIONS  1. Left ventricular ejection fraction, by estimation, is 25 to 30%. The left ventricle has severely decreased function. The left ventricle demonstrates global hypokinesis. There is moderate left ventricular hypertrophy. Left ventricular diastolic parameters are consistent with Grade I diastolic dysfunction (impaired relaxation). Elevated left ventricular end-diastolic pressure. There is severe akinesis of the left ventricular, entire apical segment, anteroseptal wall, anterior wall and distal inferoapical.  2. Right ventricular systolic function is normal. The right ventricular size is normal.  3. The mitral valve is grossly normal. Trivial mitral valve regurgitation.  4. The aortic valve is tricuspid. Aortic valve regurgitation is not visualized. Comparison(s): No significant change from prior study. 01/01/2018: 25-30%. FINDINGS  Left  Ventricle: Left ventricular ejection fraction, by estimation, is 25 to 30%. The left ventricle has severely decreased function. The left ventricle demonstrates global hypokinesis. Severe akinesis of the left ventricular, entire apical segment, anteroseptal wall, anterior wall and distal inferoapical. The left ventricular internal cavity size was normal in size. There is moderate left ventricular hypertrophy. Left ventricular diastolic parameters are consistent with Grade I diastolic dysfunction (impaired relaxation). Elevated left ventricular end-diastolic pressure. Right Ventricle: The right ventricular size is normal. No increase in right ventricular wall thickness. Right ventricular systolic function is normal. Left Atrium: Left atrial size was normal in size. Right Atrium: Right atrial size was normal in size. Pericardium: There is no evidence of pericardial effusion. Mitral Valve: The mitral valve is grossly normal. Trivial mitral valve regurgitation. Tricuspid Valve: The tricuspid valve is grossly normal. Tricuspid valve regurgitation is trivial. Aortic Valve: The aortic valve is tricuspid. Aortic valve regurgitation is not visualized. Pulmonic Valve: The pulmonic valve was normal in structure. Pulmonic valve regurgitation is not visualized. Aorta: The aortic root, ascending aorta, aortic arch and descending aorta are all structurally normal, with no evidence of dilitation or obstruction. IAS/Shunts: No atrial level shunt detected by color flow Doppler.  LEFT VENTRICLE PLAX 2D LVIDd:         4.50 cm  Diastology LVIDs:         3.60 cm  LV e' lateral:   7.94 cm/s LV PW:         1.20 cm  LV E/e' lateral: 6.4 LV IVS:        1.20 cm  LV e' medial:    6.96 cm/s LVOT diam:     2.10 cm  LV E/e' medial:  7.3 LV SV:         37 LV SV Index:   22 LVOT Area:     3.46 cm  RIGHT VENTRICLE            IVC RV Basal diam:  2.20 cm    IVC diam: 1.70 cm RV S prime:     9.03 cm/s TAPSE (M-mode): 1.7 cm LEFT ATRIUM         Index LA  diam:    3.80 cm 2.26 cm/m  AORTIC VALVE LVOT Vmax:   70.90 cm/s LVOT Vmean:  42.600 cm/s LVOT VTI:    0.106 m  AORTA  Ao Root diam: 4.10 cm Ao Asc diam:  3.70 cm MITRAL VALVE MV Area (PHT): 2.87 cm     SHUNTS MV Decel Time: 264 msec     Systemic VTI:  0.11 m MV E velocity: 50.80 cm/s   Systemic Diam: 2.10 cm MV A velocity: 134.00 cm/s MV E/A ratio:  0.38 Zoila Shutter MD Electronically signed by Zoila Shutter MD Signature Date/Time: 09/03/2019/12:08:21 PM    Final     Microbiology: No results found for this or any previous visit (from the past 240 hour(s)).   Labs: Basic Metabolic Panel: Recent Labs  Lab 09/02/19 1920 09/03/19 0323 09/04/19 0601  NA 143  --  138  K 4.2  --  3.6  CL 108  --  99  CO2 23  --  25  GLUCOSE 142*  --  106*  BUN 12  --  36*  CREATININE 1.14  --  1.62*  CALCIUM 9.0  --  8.9  MG  --  2.1 2.2   Liver Function Tests: No results for input(s): AST, ALT, ALKPHOS, BILITOT, PROT, ALBUMIN in the last 168 hours. No results for input(s): LIPASE, AMYLASE in the last 168 hours. No results for input(s): AMMONIA in the last 168 hours. CBC: Recent Labs  Lab 09/02/19 1920  WBC 10.7*  NEUTROABS 8.4*  HGB 14.7  HCT 47.3  MCV 93.3  PLT 284   Cardiac Enzymes: No results for input(s): CKTOTAL, CKMB, CKMBINDEX, TROPONINI in the last 168 hours. BNP: BNP (last 3 results) Recent Labs    09/02/19 1920  BNP 2,357.3*    ProBNP (last 3 results) No results for input(s): PROBNP in the last 8760 hours.  CBG: No results for input(s): GLUCAP in the last 168 hours.     Signed:  Rhetta Mura MD   [facility] 09/04/2019, 11:29 AM

## 2019-09-04 NOTE — Progress Notes (Signed)
Pt sleeping, BP rechecked. Left arm 69/51, right 65/43 (52), 75/48 (58), 71/60 (66). Pt reported my blood pressure is always low in the mornings. Pt reports "I feel fine". Md made aware.

## 2019-09-04 NOTE — Discharge Instructions (Signed)
Heart Failure, Self Care Heart failure is a serious condition. This sheet explains things you need to do to take care of yourself at home. To help you stay as healthy as possible, you may be asked to change your diet, take certain medicines, and make other changes in your life. Your doctor may also give you more specific instructions. If you have problems or questions, call your doctor. What are the risks? Having heart failure makes it more likely for you to have some problems. These problems can get worse if you do not take good care of yourself. Problems may include:  Blood clotting problems. This may cause a stroke.  Damage to the kidneys, liver, or lungs.  Abnormal heart rhythms. Supplies needed:  Scale for weighing yourself.  Blood pressure monitor.  Notebook.  Medicines. How to care for yourself when you have heart failure Medicines Take over-the-counter and prescription medicines only as told by your doctor. Take your medicines every day.  Do not stop taking your medicine unless your doctor tells you to do so.  Do not skip any medicines.  Get your prescriptions refilled before you run out of medicine. This is important. Eating and drinking   Eat heart-healthy foods. Talk with a diet specialist (dietitian) to create an eating plan.  Choose foods that: ? Have no trans fat. ? Are low in saturated fat and cholesterol.  Choose healthy foods, such as: ? Fresh or frozen fruits and vegetables. ? Fish. ? Low-fat (lean) meats. ? Legumes, such as beans, peas, and lentils. ? Fat-free or low-fat dairy products. ? Whole-grain foods. ? High-fiber foods.  Limit salt (sodium) if told by your doctor. Ask your diet specialist to tell you which seasonings are healthy for your heart.  Cook in healthy ways instead of frying. Healthy ways of cooking include roasting, grilling, broiling, baking, poaching, steaming, and stir-frying.  Limit how much fluid you drink, if told by your  doctor. Alcohol use  Do not drink alcohol if: ? Your doctor tells you not to drink. ? Your heart was damaged by alcohol, or you have very bad heart failure. ? You are pregnant, may be pregnant, or are planning to become pregnant.  If you drink alcohol: ? Limit how much you use to:  0-1 drink a day for women.  0-2 drinks a day for men. ? Be aware of how much alcohol is in your drink. In the [place] one drink equals one 12 oz bottle of beer (355 mL), one 5 oz glass of wine (148 mL), or one 1 oz glass of hard liquor (44 mL). Lifestyle   Do not use any products that contain nicotine or tobacco, such as cigarettes, e-cigarettes, and chewing tobacco. If you need help quitting, ask your doctor. ? Do not use nicotine gum or patches before talking to your doctor.  Do not use illegal drugs.  Lose weight if told by your doctor.  Do physical activity if told by your doctor. Talk to your doctor before you begin an exercise if: ? You are an older adult. ? You have very bad heart failure.  Learn to manage stress. If you need help, ask your doctor.  Get rehab (rehabilitation) to help you stay independent and to help with your quality of life.  Plan time to rest when you get tired. Check weight and blood pressure   Weigh yourself every day. This will help you to know if fluid is building up in your body. ? Weigh yourself every  morning after you pee (urinate) and before you eat breakfast. ? Wear the same amount of clothing each time. ? Write down your daily weight. Give your record to your doctor.  Check and write down your blood pressure as told by your doctor.  Check your pulse as told by your doctor. Dealing with very hot and very cold weather  If it is very hot: ? Avoid activities that take a lot of energy. ? Use air conditioning or fans, or find a cooler place. ? Avoid caffeine and alcohol. ? Wear clothing that is loose-fitting, lightweight, and light-colored.  If it is very  cold: ? Avoid activities that take a lot of energy. ? Layer your clothes. ? Wear mittens or gloves, a hat, and a scarf when you go outside. ? Avoid alcohol. Follow these instructions at home:  Stay up to date with shots (vaccines). Get pneumococcal and flu (influenza) shots.  Keep all follow-up visits as told by your doctor. This is important. Contact a doctor if:  You gain weight quickly.  You have increasing shortness of breath.  You cannot do your normal activities.  You get tired easily.  You cough a lot.  You don't feel like eating or feel like you may vomit (nauseous).  You become puffy (swell) in your hands, feet, ankles, or belly (abdomen).  You cannot sleep well because it is hard to breathe.  You feel like your heart is beating fast (palpitations).  You get dizzy when you stand up. Get help right away if:  You have trouble breathing.  You or someone else notices a change in your behavior, such as having trouble staying awake.  You have chest pain or discomfort.  You pass out (faint). These symptoms may be an emergency. Do not wait to see if the symptoms will go away. Get medical help right away. Call your local emergency services (911 in the [place] Do not drive yourself to the hospital. Summary  Heart failure is a serious condition. To care for yourself, you may have to change your diet, take medicines, and make other lifestyle changes.  Take your medicines every day. Do not stop taking them unless your doctor tells you to do so.  Eat heart-healthy foods, such as fresh or frozen fruits and vegetables, fish, lean meats, legumes, fat-free or low-fat dairy products, and whole-grain or high-fiber foods.  Ask your doctor if you can drink alcohol. You may have to stop alcohol use if you have very bad heart failure.  Contact your doctor if you gain weight quickly or feel that your heart is beating too fast. Get help right away if you pass out, or have chest pain  or trouble breathing. This information is not intended to replace advice given to you by your health care provider. Make sure you discuss any questions you have with your health care provider. Document Revised: 09/24/2018 Document Reviewed: 09/25/2018 Elsevier Patient Education  2020 [employer] Heart Failure Education: 1. Weigh yourself EVERY morning after you go to the bathroom but before you eat or drink anything. Write this number down in a weight log/diary. If you gain 3 pounds overnight or 5 pounds in a week, call the office. 2. Take your medicines as prescribed. If you have concerns about your medications, please call us before you stop taking them.  3. Eat low salt foods--Limit salt (sodium) to 2000 mg per day. This will help prevent your body from holding onto fluid. Read food labels as many processed foods  have a lot of sodium, especially canned goods and prepackaged meats. If you would like some assistance choosing low sodium foods, we would be happy to set you up with a nutritionist. 4. Stay as active as you can everyday. Staying active will give you more energy and make your muscles stronger. Start with 5 minutes at a time and work your way up to 30 minutes a day. Break up your activities--do some in the morning and some in the afternoon. Start with 3 days per week and work your way up to 5 days as you can.  If you have chest pain, feel short of breath, dizzy, or lightheaded, STOP. If you don't feel better after a short rest, call 911. If you do feel better, call the office to let us know you have symptoms with exercise. 5. Limit all fluids for the day to less than 2 liters. Fluid includes all drinks, coffee, juice, ice chips, soup, jello, and all other liquids.

## 2019-09-12 ENCOUNTER — Telehealth: Payer: Self-pay | Admitting: Cardiology

## 2019-09-12 NOTE — Telephone Encounter (Signed)
Attempted to contact pt.  No answer and VM is not set up. Was going to advise to hold off on supplement until he is seen next week and bring bottle with him if he has already purchased.

## 2019-09-12 NOTE — Telephone Encounter (Signed)
Patient states he is requesting clearance to begin taking a beets superfood supplement. Please advise.

## 2019-09-15 NOTE — Progress Notes (Deleted)
Cardiology Office Note   Date:  09/15/2019   ID:  Ray Smith 01/11/1946, MRN [id number]  PCP:  Patient, No Pcp Per  Cardiologist:  Dr. Katrinka Blazing, MD   No chief complaint on file.    History of Present Illness: Ray Smith is a 74 y.o. male who presents for post hospital follow up, see for Dr. Katrinka Blazing.    Ray Smith has a hx of CAD s/p complex PCI to LM bifurcation in 2019, chronic combined CHF, ischemic cardiomyopathy, HTN, HLD, tobacco abuse with presumed COPD, and non-compliance, who is being followed by cardiology for the evaluation of CHF.  He was initially seen by our service 12/2017 after presenting to [facility] as a STEMI. He underwent cardiac cath with Dr. Excell Seltzer withdistal left main stenosis, total occlusion ostial LAD and severe stenosis ostial Circumflex. This was treated with stenting from the left main into the LAD and the Circumflex with an excellent result.Placed on DAPT with ASA/plavix indefinitely. He was also placed on high dose statin, and low dose BB. LV gram noted EF of <25%. Initially he refused to have follow up echo done, but did consent. Echo showed EF of 25-30%, akinesis of the anteroseptal, anterior, anterolateral, lateral, inferolateral, and apical myocardium. No LV thrombus noted. During admission struggled with issues on non compliance and was difficult with the staff. LDL noted at 130. Trop peaked at >65 x2. PT was consulted but he refused. No further chest pain post cath. Considered lifevest, but given his medical noncompliance would not consider him a good candidate for therapy as he would not wear the vest.Compliance with DAPT Did reinforce the need to remain compliant with DAPT given his stents.   He has not been seen in follow up since this time.    He was last seen by myself 04/07/2019 at which time he had no anginal symptoms and is felt "great ".  He had no follow-up care with PCP or cardiology service in the post-cath setting from 12/2017.  He  reported the only medications that he is continued to take was ASA 81 mg and Plavix 75 mg.   He was then admitted to [facility] for acute systolic CHF 09/03/19 at which time cardiology followed along. Echocardiogram showed reduced LV function at 25-30% with global hypokinesis and G1DD. He was given IV Lasix and started on Entresto however did not tolerate well secondary to hypotension.     1. Acute on chronic combined CHF:  -Suspected ischemic cardiomyopathy, persistent from 2019.  -Echo 09/03/19 with EF 25-30%, G1DD, global hypokinesis with severe akinesis of the LV, entire apical segment, anteroseptal wall, anterior wall, and distal inferoapical wall, with no significant valvular abnormalities.  -BNP elevated to 2300s>>trated with IV lasix -Has issues with hypotension and mild renal dysfunction  therefore Entresto was held.  -D/c on PRN lasix dosing at home for weight gain of 3lbs in 1 day, 5lbs in 1 week, or worsening SOB. -Continue carvedilol 3.125mg  BID -Will hold entresto given hypotension overnight and bump in Cr - could consider starting outpatient if BP and Cr will tolerate - Could consider addition of spironolactone outpatient if BP/Cr will tolerate  - Continue to monitor strict I&Os and daily weights - Continue to monitor electrolytes and replete to maintain K>4, Mg >2  2. CAD s/p complex PCI to LM bifurcation in 2019:  -Trop mildly elevated with flat trend not c/w ACS.  -EKG non-ischemic. Unclear if he has been on DAPT with aspirin  and plavix despite recommended long term use given PCI location.  - Continue aspirin, plavix, and statin - Continue carvedilol - Ensure up to date prn SL nitro Rx at discharge  3. HTN:  -BP in the 60s/50s this morning. Surprisingly he was asymptomatic. Started on entresto yesterday for management of #1.  - Will hold entresto given hypotension overnight - Continue carvedilol  4. HLD:  -LDL 130 2019 - Will add on lipid panel to AM labs - Continue  atorvastatin 80mg  daily  5. COPD: o -On supportive care with steroids, nebulizers, and inhalers for possible exacerbation.  - Continue management per primary team.   6. CKD: -Creatinine on hospital discharge, 1.62 -Baseline appears to be in the 1.1-1.3 rangs -  Educated him on the nature of his heart failure and importance of maintaining a low sodium diet, limiting fluid intake, and monitoring his weight daily. We talked about why his is on carvedilol and how this medication works to support his heart function. We talked about his CAD and need for long term DAPT, as well as cholesterol medications    Past Medical History:  Diagnosis Date  . Acute ST elevation myocardial infarction (STEMI) involving left anterior descending (LAD) coronary artery (HCC) 12/30/2017  . Acute systolic heart failure (HCC) 12/30/2017  . Coronary artery disease    a.  presented with CP and inf STE but no ACS - LHC (10/23/13):  Dist LM 40-60%, ostial LAD 80%, mid LAD 50%, ostial CFX 50-70%, mid RCA 50%.  EF 60%. - CP not felt to be ischemic; CABG vs Med Rx d/w pt - pt opted for Med Rx  . History of kidney stones    "more than 20" (01/09/2018)  . Hyperlipidemia   . Hypertension   . Marijuana abuse   . Tobacco dependence     Past Surgical History:  Procedure Laterality Date  . CORONARY/GRAFT ACUTE MI REVASCULARIZATION N/A 12/30/2017   Procedure: Coronary/Graft Acute MI Revascularization;  Surgeon: 03/02/2018, MD;  Location: [facility] INVASIVE CV LAB;  Service: Cardiovascular;  Laterality: N/A;  Distal LM into LAD Xience SIerra 3.5x80mm Ostial Cx Xience 22m 3.5x66mm  . LEFT HEART CATH AND CORONARY ANGIOGRAPHY N/A 12/30/2017   Procedure: LEFT HEART CATH AND CORONARY ANGIOGRAPHY;  Surgeon: 03/02/2018, MD;  Location: [facility] INVASIVE CV LAB;  Service: Cardiovascular;  Laterality: N/A;  . LEFT HEART CATHETERIZATION WITH CORONARY ANGIOGRAM Bilateral 10/23/2013   Procedure: LEFT HEART CATHETERIZATION WITH CORONARY  ANGIOGRAM;  Surgeon: 10/25/2013, MD;  Location: [facility] CATH LAB;  Service: Cardiovascular;  Laterality: Bilateral;  . snake bite surgery    . TONSILLECTOMY  1955  . TOTAL KNEE ARTHROPLASTY  2001     Current Outpatient Medications  Medication Sig Dispense Refill  . albuterol (VENTOLIN HFA) 108 (90 Base) MCG/ACT inhaler Inhale 2 puffs into the lungs every 6 (six) hours as needed for wheezing or shortness of breath. 8 g 1  . aspirin 81 MG EC tablet Take 1 tablet (81 mg total) by mouth daily. 30 tablet 12  . atorvastatin (LIPITOR) 80 MG tablet Take 1 tablet (80 mg total) by mouth daily at 6 PM. 90 tablet 1  . carvedilol (COREG) 3.125 MG tablet Take 1 tablet (3.125 mg total) by mouth 2 (two) times daily with a meal. 60 tablet 11  . clopidogrel (PLAVIX) 75 MG tablet Take 1 tablet (75 mg total) by mouth daily. 90 tablet 12  . furosemide (LASIX) 40 MG tablet Take 1 tablet (40 mg total)  by mouth every other day. If u feel short of breath, you may take daily for 3 days then go back to every other day 30 tablet 3  . nitroGLYCERIN (NITROSTAT) 0.4 MG SL tablet Place 1 tablet (0.4 mg total) under the tongue every 5 (five) minutes x 3 doses as needed for chest pain. 25 tablet 2  . pantoprazole (PROTONIX) 40 MG tablet Take 1 tablet (40 mg total) by mouth at bedtime. 30 tablet 1   No current facility-administered medications for this visit.    Allergies:   Tramadol, Flexeril [cyclobenzaprine], Ibuprofen, and Lactose intolerance (gi)    Social History:  The patient  reports that he has been smoking cigarettes. He has a 240.00 pack-year smoking history. He has never used smokeless tobacco. He reports current drug use. Drug: Marijuana. He reports that he does not drink alcohol.   Family History:  The patient's family history includes Alzheimer's disease in his father; Diabetes in his maternal grandmother; Drug abuse in an other family member; Heart disease in his mother; Multiple sclerosis in his sister.     ROS:  Please see the history of present illness.   Otherwise, review of systems are positive for none.   All other systems are reviewed and negative.    PHYSICAL EXAM: VS:  There were no vitals taken for this visit. , BMI There is no height or weight on file to calculate BMI.   General: Well developed, well nourished, NAD Skin: Warm, dry, intact  Head: Normocephalic, atraumatic, sclera non-icteric, no xanthomas, clear, moist mucus membranes. Neck: Negative for carotid bruits. No JVD Lungs:Clear to ausculation bilaterally. No wheezes, rales, or rhonchi. Breathing is unlabored. Cardiovascular: RRR with S1 S2. No murmurs, rubs, gallops, or LV heave appreciated. Abdomen: Soft, non-tender, non-distended with normoactive bowel sounds. No hepatomegaly, No rebound/guarding. No obvious abdominal masses. MSK: Strength and tone appear normal for age. 5/5 in all extremities Extremities: No edema. No clubbing or cyanosis. DP/PT pulses 2+ bilaterally Neuro: Alert and oriented. No focal deficits. No facial asymmetry. MAE spontaneously. Psych: Responds to questions appropriately with normal affect.       EKG:  EKG {ACTION; IS/IS YTK:[id number]} ordered today. The ekg ordered today demonstrates ***   Recent Labs: 09/02/2019: B Natriuretic Peptide 2,357.3; Hemoglobin 14.7; Platelets 284 09/04/2019: BUN 36; Creatinine, Ser 1.62; Magnesium 2.2; Potassium 3.6; Sodium 138    Lipid Panel    Component Value Date/Time   CHOL 237 (H) 09/04/2019 0601   TRIG 126 09/04/2019 0601   HDL 48 09/04/2019 0601   CHOLHDL 4.9 09/04/2019 0601   VLDL 25 09/04/2019 0601   LDLCALC 164 (H) 09/04/2019 0601     Wt Readings from Last 3 Encounters:  09/03/19 120 lb 13 oz (54.8 kg)  03/18/18 120 lb (54.4 kg)  01/18/18 123 lb 0.3 oz (55.8 kg)     Other studies Reviewed: Additional studies/ records that were reviewed today include:   Echocardiogram 09/03/2019:  1. Left ventricular ejection fraction, by estimation,  is 25 to 30%. The  left ventricle has severely decreased function. The left ventricle  demonstrates global hypokinesis. There is moderate left ventricular  hypertrophy. Left ventricular diastolic  parameters are consistent with Grade I diastolic dysfunction (impaired  relaxation). Elevated left ventricular end-diastolic pressure. There is  severe akinesis of the left ventricular, entire apical segment,  anteroseptal wall, anterior wall and distal  inferoapical.  2. Right ventricular systolic function is normal. The right ventricular  size is normal.  3. The mitral valve  is grossly normal. Trivial mitral valve  regurgitation.  4. The aortic valve is tricuspid. Aortic valve regurgitation is not  visualized.    Left heart catheterization 12/2017:   Mid RCA lesion is 70% stenosed.  Mid LM to Dist LM lesion is 90% stenosed.  Ost LAD to Prox LAD lesion is 100% stenosed.  Ost Cx to Prox Cx lesion is 90% stenosed.  Prox Cx to Mid Cx lesion is 50% stenosed.  Prox LAD to Mid LAD lesion is 40% stenosed.  Mid LAD lesion is 50% stenosed.  Ost 1st Diag to 1st Diag lesion is 40% stenosed.  A drug-eluting stent was successfully placed using a STENT SIERRA 3.50 X 33 MM.  Post intervention, there is a 0% residual stenosis.  Post intervention, there is a 0% residual stenosis.  A drug-eluting stent was successfully placed using a STENT SIERRA 3.50 X 23 MM.  Post intervention, there is a 0% residual stenosis.  There is severe left ventricular systolic dysfunction.  LV end diastolic pressure is normal.  The left ventricular ejection fraction is 25-35% by visual estimate.  1. Severe distal left main stenosis 2. Acute total occlusion of the proximal LAD 3. Severe ostial left circumflex stenosis 4. Moderate mid RCA stenosis 5. Severe segmental LV systolic dysfunction consistent with large anterolateral infarction with LVEF estimated at 25% 6. Successful complex PCI of the  left mainstem/proximal LAD/proximal circumflex as outlined above  Recommend dual antiplatelet therapy with Aspirin 81mg  daily and Clopidogrel 75mg  dailylong-term (beyond 12 months) because of complex left main bifurcation PCI in the setting of anterior MI.   ASSESSMENT AND PLAN:  1.  ***   Current medicines are reviewed at length with the patient today.  The patient {ACTIONS; HAS/DOES NOT HAVE:19233} concerns regarding medicines.  The following changes have been made:  {PLAN; NO CHANGE:13088:s}  Labs/ tests ordered today include: *** No orders of the defined types were placed in this encounter.    Disposition:   FU with *** in {gen number {Days to years:10300}  Signed, , NP  09/15/2019 4:14 PM    [facility] [address] [place], [place], [place]  [place] Phone: [phone]; Fax: [phone]

## 2019-09-19 ENCOUNTER — Ambulatory Visit: Payer: Medicare Other | Admitting: Cardiology

## 2019-09-19 NOTE — Telephone Encounter (Signed)
Pt cancelled appt today.  Will route to PharmD team to see if any issues with Beets Superfood Supplement.

## 2019-09-19 NOTE — Telephone Encounter (Signed)
It is ok for patient to take beets superfood supplement. He should keep a closer eye on his blood pressure as it has the potential to lower blood pressure.

## 2019-09-19 NOTE — Telephone Encounter (Signed)
Spoke with pt and went over information from [place], [facility].  Pt appreciative for call.

## 2019-09-20 NOTE — Progress Notes (Deleted)
Cardiology Office Note:    Date:  09/20/2019   ID:  BYFORD SCHOOLS, DOB [birth year], MRN [id number]  PCP:  Patient, No Pcp Per  Cardiologist:  Candee Furbish, MD   Referring MD: No ref. provider found   No chief complaint on file.   History of Present Illness:    Ray Smith is a 74 y.o. male with a hx of CAD s/p PCI/DES to left main stem/proximal LAD 89/3810, acute systolic HF, tobacco use, HLD, and HTN.   ***  Past Medical History:  Diagnosis Date  . Acute ST elevation myocardial infarction (STEMI) involving left anterior descending (LAD) coronary artery ([place]) 12/30/2017  . Acute systolic heart failure ([place]) 12/30/2017  . Coronary artery disease    a.  presented with CP and inf STE but no ACS - LHC (10/23/13):  Dist LM 40-60%, ostial LAD 80%, mid LAD 50%, ostial CFX 50-70%, mid RCA 50%.  EF 60%. - CP not felt to be ischemic; CABG vs Med Rx d/w pt - pt opted for Med Rx  . History of kidney stones    "more than 20" (01/09/2018)  . Hyperlipidemia   . Hypertension   . Marijuana abuse   . Tobacco dependence     Past Surgical History:  Procedure Laterality Date  . CORONARY/GRAFT ACUTE MI REVASCULARIZATION N/A 12/30/2017   Procedure: Coronary/Graft Acute MI Revascularization;  Surgeon: Sherren Mocha, MD;  Location: [facility];  Service: Cardiovascular;  Laterality: N/A;  Distal LM into LAD Xience [place] 3.5x51mm Ostial Cx [place] 3.5x40mm  . LEFT HEART CATH AND CORONARY ANGIOGRAPHY N/A 12/30/2017   Procedure: LEFT HEART CATH AND CORONARY ANGIOGRAPHY;  Surgeon: Sherren Mocha, MD;  Location: [facility];  Service: Cardiovascular;  Laterality: N/A;  . LEFT HEART CATHETERIZATION WITH CORONARY ANGIOGRAM Bilateral 10/23/2013   Procedure: LEFT HEART CATHETERIZATION WITH CORONARY ANGIOGRAM;  Surgeon: Sinclair Grooms, MD;  Location: [facility] C[facility] LAB;  Service: Cardiovascular;  Laterality: Bilateral;  . snake bite surgery    . TONSILLECTOMY  1955  . TOTAL KNEE ARTHROPLASTY   2001    Current Medications: No outpatient medications have been marked as taking for the 09/22/19 encounter (Appointment) with Belva Crome, MD.     Allergies:   Tramadol, Flexeril [cyclobenzaprine], Ibuprofen, and Lactose intolerance (gi)   Social History   Socioeconomic History  . Marital status: Divorced    Spouse name: Not on file  . Number of children: Not on file  . Years of education: Not on file  . Highest education level: Not on file  Occupational History  . Not on file  Tobacco Use  . Smoking status: Current Every Day Smoker    Packs/day: 6.00    Years: 40.00    Pack years: 240.00    Types: Cigarettes  . Smokeless tobacco: Never Used  Substance and Sexual Activity  . Alcohol use: No  . Drug use: Yes    Types: Marijuana  . Sexual activity: Not on file  Other Topics Concern  . Not on file  Social History Narrative  . Not on file   Social Determinants of Health   Financial Resource Strain:   . Difficulty of Paying Living Expenses:   Food Insecurity:   . Worried About Charity fundraiser in the Last Year:   . Arboriculturist in the Last Year:   Transportation Needs:   . Film/video editor (Medical):   Marland Kitchen Lack of Transportation (Non-Medical):  Physical Activity:   . Days of Exercise per Week:   . Minutes of Exercise per Session:   Stress:   . Feeling of Stress :   Social Connections:   . Frequency of Communication with Friends and Family:   . Frequency of Social Gatherings with Friends and Family:   . Attends Religious Services:   . Active Member of Clubs or Organizations:   . Attends Banker Meetings:   Marland Kitchen Marital Status:      Family History: The patient's family history includes Alzheimer's disease in his father; Diabetes in his maternal grandmother; Drug abuse in an other family member; Heart disease in his mother; Multiple sclerosis in his sister.  ROS:   Please see the history of present illness.    *** All other systems  reviewed and are negative.  EKGs/Labs/Other Studies Reviewed:    The following studies were reviewed today:  CARDIAC CATH AND PCI 2019: Diagnostic Dominance: Right  Intervention   ECHOCARDIOGRAPHY 08/2019: IMPRESSIONS    1. Left ventricular ejection fraction, by estimation, is 25 to 30%. The  left ventricle has severely decreased function. The left ventricle  demonstrates global hypokinesis. There is moderate left ventricular  hypertrophy. Left ventricular diastolic  parameters are consistent with Grade I diastolic dysfunction (impaired  relaxation). Elevated left ventricular end-diastolic pressure. There is  severe akinesis of the left ventricular, entire apical segment,  anteroseptal wall, anterior wall and distal  inferoapical.  2. Right ventricular systolic function is normal. The right ventricular  size is normal.  3. The mitral valve is grossly normal. Trivial mitral valve  regurgitation.  4. The aortic valve is tricuspid. Aortic valve regurgitation is not  visualized.    EKG:  EKG ***  Recent Labs: 09/02/2019: B Natriuretic Peptide 2,357.3; Hemoglobin 14.7; Platelets 284 09/04/2019: BUN 36; Creatinine, Ser 1.62; Magnesium 2.2; Potassium 3.6; Sodium 138  Recent Lipid Panel    Component Value Date/Time   CHOL 237 (H) 09/04/2019 0601   TRIG 126 09/04/2019 0601   HDL 48 09/04/2019 0601   CHOLHDL 4.9 09/04/2019 0601   VLDL 25 09/04/2019 0601   LDLCALC 164 (H) 09/04/2019 0601    Physical Exam:    VS:  There were no vitals taken for this visit.    Wt Readings from Last 3 Encounters:  09/03/19 120 lb 13 oz (54.8 kg)  03/18/18 120 lb (54.4 kg)  01/18/18 123 lb 0.3 oz (55.8 kg)     GEN: ***. No acute distress HEENT: Normal NECK: No JVD. LYMPHATICS: No lymphadenopathy CARDIAC: *** RRR without murmur, gallop, or edema. VASCULAR: *** Normal Pulses. No bruits. RESPIRATORY:  Clear to auscultation without rales, wheezing or rhonchi  ABDOMEN: Soft, non-tender,  non-distended, No pulsatile mass, MUSCULOSKELETAL: No deformity  SKIN: Warm and dry NEUROLOGIC:  Alert and oriented x 3 PSYCHIATRIC:  Normal affect   ASSESSMENT:    1. Atherosclerosis of native coronary artery of native heart with other form of angina pectoris (HCC)   2. Chronic systolic heart failure (HCC)   3. Hypertension   4. Elevated lipoprotein(a)   5. Hyperlipidemia, unspecified hyperlipidemia type   6. Educated about COVID-19 virus infection    PLAN:    In order of problems listed above:  1. ***   Medication Adjustments/Labs and Tests Ordered: Current medicines are reviewed at length with the patient today.  Concerns regarding medicines are outlined above.  No orders of the defined types were placed in this encounter.  No orders of the defined  types were placed in this encounter.   There are no Patient Instructions on file for this visit.   Signed, Lesleigh Noe, MD  09/20/2019 3:33 PM    [facility]

## 2019-09-22 ENCOUNTER — Ambulatory Visit: Payer: Medicare Other | Admitting: Interventional Cardiology

## 2019-10-15 ENCOUNTER — Ambulatory Visit: Payer: Medicare Other | Admitting: Physician Assistant

## 2019-12-11 ENCOUNTER — Other Ambulatory Visit: Payer: Self-pay | Admitting: Cardiology

## 2019-12-21 ENCOUNTER — Emergency Department (HOSPITAL_COMMUNITY)
Admission: EM | Admit: 2019-12-21 | Discharge: 2019-12-21 | Disposition: A | Discharge status: Home / Self Care | Payer: Medicare HMO | Attending: Emergency Medicine | Admitting: Emergency Medicine

## 2019-12-21 ENCOUNTER — Emergency Department (HOSPITAL_COMMUNITY): Payer: Medicare HMO

## 2019-12-21 ENCOUNTER — Encounter (HOSPITAL_COMMUNITY): Payer: Self-pay | Admitting: Emergency Medicine

## 2019-12-21 DIAGNOSIS — Y9389 Activity, other specified: Secondary | ICD-10-CM | POA: Diagnosis not present

## 2019-12-21 DIAGNOSIS — T2020XA Burn of second degree of head, face, and neck, unspecified site, initial encounter: Secondary | ICD-10-CM

## 2019-12-21 DIAGNOSIS — T1490XA Injury, unspecified, initial encounter: Secondary | ICD-10-CM

## 2019-12-21 DIAGNOSIS — R208 Other disturbances of skin sensation: Secondary | ICD-10-CM | POA: Diagnosis not present

## 2019-12-21 DIAGNOSIS — Y998 Other external cause status: Secondary | ICD-10-CM | POA: Insufficient documentation

## 2019-12-21 DIAGNOSIS — Y92018 Other place in single-family (private) house as the place of occurrence of the external cause: Secondary | ICD-10-CM | POA: Diagnosis not present

## 2019-12-21 DIAGNOSIS — X088XXA Exposure to other specified smoke, fire and flames, initial encounter: Secondary | ICD-10-CM | POA: Diagnosis not present

## 2019-12-21 DIAGNOSIS — R0602 Shortness of breath: Secondary | ICD-10-CM | POA: Diagnosis not present

## 2019-12-21 DIAGNOSIS — T2000XA Burn of unspecified degree of head, face, and neck, unspecified site, initial encounter: Secondary | ICD-10-CM | POA: Diagnosis present

## 2019-12-21 HISTORY — DX: Chronic obstructive pulmonary disease, unspecified: J44.9

## 2019-12-21 HISTORY — DX: Nonrheumatic mitral (valve) insufficiency: I34.0

## 2019-12-21 LAB — PROTIME-INR
INR: 1 (ref 0.8–1.2)
Prothrombin Time: 12.9 seconds (ref 11.4–15.2)

## 2019-12-21 LAB — COMPREHENSIVE METABOLIC PANEL
ALT: 13 U/L (ref 0–44)
AST: 20 U/L (ref 15–41)
Albumin: 4 g/dL (ref 3.5–5.0)
Alkaline Phosphatase: 115 U/L (ref 38–126)
Anion gap: 12 (ref 5–15)
BUN: 18 mg/dL (ref 8–23)
CO2: 24 mmol/L (ref 22–32)
Calcium: 9.8 mg/dL (ref 8.9–10.3)
Chloride: 104 mmol/L (ref 98–111)
Creatinine, Ser: 1.11 mg/dL (ref 0.61–1.24)
GFR calc Af Amer: >60 mL/min (ref >60)
GFR calc non Af Amer: >60 mL/min (ref >60)
Glucose, Bld: 106 mg/dL — ABNORMAL HIGH (ref 70–99)
Potassium: 4.5 mmol/L (ref 3.5–5.1)
Sodium: 140 mmol/L (ref 135–145)
Total Bilirubin: 0.9 mg/dL (ref 0.3–1.2)
Total Protein: 7 g/dL (ref 6.5–8.1)

## 2019-12-21 LAB — CBC
HCT: 49.3 % (ref 39.0–52.0)
Hemoglobin: 15.5 g/dL (ref 13.0–17.0)
MCH: 29.4 pg (ref 26.0–34.0)
MCHC: 31.4 g/dL (ref 30.0–36.0)
MCV: 93.4 fL (ref 80.0–100.0)
Platelets: 282 10*3/uL (ref 150–400)
RBC: 5.28 MIL/uL (ref 4.22–5.81)
RDW: 15.4 % (ref 11.5–15.5)
WBC: 12.1 10*3/uL — ABNORMAL HIGH (ref 4.0–10.5)
nRBC: 0 % (ref 0.0–0.2)

## 2019-12-21 LAB — COOXEMETRY PANEL
Carboxyhemoglobin: 0.9 % (ref 0.5–1.5)
Methemoglobin: 0.8 % (ref 0.0–1.5)
O2 Saturation: 67.4 %
Total hemoglobin: 13.8 g/dL (ref 12.0–16.0)

## 2019-12-21 LAB — LACTIC ACID, PLASMA: Lactic Acid, Venous: 1.1 mmol/L (ref 0.5–1.9)

## 2019-12-21 MED ORDER — ALBUTEROL SULFATE (2.5 MG/3ML) 0.083% IN NEBU
10.0000 mg | INHALATION_SOLUTION | Freq: Once | RESPIRATORY_TRACT | Status: AC
  Administered 2019-12-21: 10 mg via RESPIRATORY_TRACT

## 2019-12-21 MED ORDER — FENTANYL CITRATE (PF) 100 MCG/2ML IJ SOLN
INTRAMUSCULAR | Status: AC
  Filled 2019-12-21: qty 2

## 2019-12-21 MED ORDER — FENTANYL CITRATE (PF) 100 MCG/2ML IJ SOLN
100.0000 ug | Freq: Once | INTRAMUSCULAR | Status: AC
  Administered 2019-12-21: 100 ug via INTRAVENOUS

## 2019-12-21 MED ORDER — OXYCODONE-ACETAMINOPHEN 5-325 MG PO TABS: 1.0000 | ORAL_TABLET | ORAL | 0 refills | Status: DC | PRN

## 2019-12-21 MED ORDER — SODIUM CHLORIDE 0.9 % IV BOLUS
1000.0000 mL | Freq: Once | INTRAVENOUS | Status: AC
  Administered 2019-12-21: 1000 mL via INTRAVENOUS

## 2019-12-21 NOTE — ED Notes (Signed)
Pt refuses to be transferred to [facility].  Risks and benefits explained to pt by Dr; Rush Landmark  AMA form signed by pt.  Pt discharged with his son.

## 2019-12-21 NOTE — Consult Note (Signed)
Responded to page, pt unavailable, no family present, staff will page again if further need of chaplain services.  Rev. Chandell Attridge Chaplain 
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
## 2019-12-21 NOTE — ED Notes (Signed)
After pt signed AMA form and started walking down hall to leave he decided that he wanted to go to [facility] but st's he needs to go home first to check on his dog.
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
## 2019-12-21 NOTE — ED Notes (Signed)
Pt refused to have Covid Test.  Dr. Julieanne Manson made aware
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
## 2019-12-21 NOTE — ED Notes (Signed)
Pt's son at bedside

## 2019-12-22 ENCOUNTER — Encounter (HOSPITAL_COMMUNITY): Payer: Self-pay | Admitting: Internal Medicine

## 2020-01-04 ENCOUNTER — Other Ambulatory Visit: Payer: Self-pay

## 2020-01-04 ENCOUNTER — Emergency Department (HOSPITAL_COMMUNITY)
Admission: EM | Admit: 2020-01-04 | Discharge: 2020-01-04 | Disposition: A | Discharge status: Home / Self Care | Payer: Medicare HMO | Attending: Emergency Medicine | Admitting: Emergency Medicine

## 2020-01-04 ENCOUNTER — Emergency Department (HOSPITAL_COMMUNITY): Payer: Medicare HMO

## 2020-01-04 ENCOUNTER — Encounter (HOSPITAL_COMMUNITY): Payer: Self-pay

## 2020-01-04 DIAGNOSIS — Z79899 Other long term (current) drug therapy: Secondary | ICD-10-CM | POA: Diagnosis not present

## 2020-01-04 DIAGNOSIS — N182 Chronic kidney disease, stage 2 (mild): Secondary | ICD-10-CM | POA: Insufficient documentation

## 2020-01-04 DIAGNOSIS — Z7982 Long term (current) use of aspirin: Secondary | ICD-10-CM | POA: Insufficient documentation

## 2020-01-04 DIAGNOSIS — I130 Hypertensive heart and chronic kidney disease with heart failure and stage 1 through stage 4 chronic kidney disease, or unspecified chronic kidney disease: Secondary | ICD-10-CM | POA: Diagnosis not present

## 2020-01-04 DIAGNOSIS — J441 Chronic obstructive pulmonary disease with (acute) exacerbation: Secondary | ICD-10-CM | POA: Diagnosis not present

## 2020-01-04 DIAGNOSIS — I509 Heart failure, unspecified: Secondary | ICD-10-CM | POA: Insufficient documentation

## 2020-01-04 DIAGNOSIS — I2510 Atherosclerotic heart disease of native coronary artery without angina pectoris: Secondary | ICD-10-CM | POA: Diagnosis not present

## 2020-01-04 DIAGNOSIS — R0602 Shortness of breath: Secondary | ICD-10-CM | POA: Diagnosis present

## 2020-01-04 DIAGNOSIS — Z87891 Personal history of nicotine dependence: Secondary | ICD-10-CM | POA: Diagnosis not present

## 2020-01-04 LAB — CBC WITH DIFFERENTIAL/PLATELET
Abs Immature Granulocytes: 0.04 10*3/uL (ref 0.00–0.07)
Basophils Absolute: 0.1 10*3/uL (ref 0.0–0.1)
Basophils Relative: 0 %
Eosinophils Absolute: 0.1 10*3/uL (ref 0.0–0.5)
Eosinophils Relative: 1 %
HCT: 47.3 % (ref 39.0–52.0)
Hemoglobin: 14.9 g/dL (ref 13.0–17.0)
Immature Granulocytes: 0 %
Lymphocytes Relative: 5 %
Lymphs Abs: 0.6 10*3/uL — ABNORMAL LOW (ref 0.7–4.0)
MCH: 29.2 pg (ref 26.0–34.0)
MCHC: 31.5 g/dL (ref 30.0–36.0)
MCV: 92.6 fL (ref 80.0–100.0)
Monocytes Absolute: 0.9 10*3/uL (ref 0.1–1.0)
Monocytes Relative: 8 %
Neutro Abs: 10.5 10*3/uL — ABNORMAL HIGH (ref 1.7–7.7)
Neutrophils Relative %: 86 %
Platelets: 209 10*3/uL (ref 150–400)
RBC: 5.11 MIL/uL (ref 4.22–5.81)
RDW: 14.6 % (ref 11.5–15.5)
WBC: 12.2 10*3/uL — ABNORMAL HIGH (ref 4.0–10.5)
nRBC: 0 % (ref 0.0–0.2)

## 2020-01-04 LAB — COMPREHENSIVE METABOLIC PANEL
ALT: 9 U/L (ref 0–44)
AST: 16 U/L (ref 15–41)
Albumin: 3.6 g/dL (ref 3.5–5.0)
Alkaline Phosphatase: 106 U/L (ref 38–126)
Anion gap: 10 (ref 5–15)
BUN: 18 mg/dL (ref 8–23)
CO2: 23 mmol/L (ref 22–32)
Calcium: 9.3 mg/dL (ref 8.9–10.3)
Chloride: 107 mmol/L (ref 98–111)
Creatinine, Ser: 1.24 mg/dL (ref 0.61–1.24)
GFR calc Af Amer: >60 mL/min (ref >60)
GFR calc non Af Amer: 57 mL/min — ABNORMAL LOW (ref >60)
Glucose, Bld: 111 mg/dL — ABNORMAL HIGH (ref 70–99)
Potassium: 3.8 mmol/L (ref 3.5–5.1)
Sodium: 140 mmol/L (ref 135–145)
Total Bilirubin: 0.8 mg/dL (ref 0.3–1.2)
Total Protein: 7.1 g/dL (ref 6.5–8.1)

## 2020-01-04 LAB — TROPONIN I (HIGH SENSITIVITY)
Troponin I (High Sensitivity): 28 ng/L — ABNORMAL HIGH (ref <18)
Troponin I (High Sensitivity): 24 ng/L — ABNORMAL HIGH (ref <18)

## 2020-01-04 MED ORDER — METHYLPREDNISOLONE SODIUM SUCC 125 MG IJ SOLR
125.0000 mg | Freq: Once | INTRAMUSCULAR | Status: AC
  Administered 2020-01-04: 125 mg via INTRAVENOUS
  Filled 2020-01-04: qty 2

## 2020-01-04 MED ORDER — ALBUTEROL (5 MG/ML) CONTINUOUS INHALATION SOLN
5.0000 mg/h | INHALATION_SOLUTION | RESPIRATORY_TRACT | Status: DC
  Administered 2020-01-04: 5 mg/h via RESPIRATORY_TRACT
  Filled 2020-01-04: qty 20

## 2020-01-04 MED ORDER — DOXYCYCLINE HYCLATE 100 MG PO CAPS: 100.0000 mg | ORAL_CAPSULE | Freq: Two times a day (BID) | ORAL | 0 refills | Status: AC

## 2020-01-04 MED ORDER — ALBUTEROL (5 MG/ML) CONTINUOUS INHALATION SOLN
10.0000 mg/h | INHALATION_SOLUTION | Freq: Once | RESPIRATORY_TRACT | Status: AC
  Administered 2020-01-04: 10 mg/h via RESPIRATORY_TRACT

## 2020-01-04 MED ORDER — IPRATROPIUM BROMIDE 0.02 % IN SOLN
0.5000 mg | Freq: Once | RESPIRATORY_TRACT | Status: AC
  Administered 2020-01-04: 0.5 mg via RESPIRATORY_TRACT

## 2020-01-04 MED ORDER — ALBUTEROL SULFATE HFA 108 (90 BASE) MCG/ACT IN AERS
4.0000 | INHALATION_SPRAY | Freq: Once | RESPIRATORY_TRACT | Status: AC
  Administered 2020-01-04: 4 via RESPIRATORY_TRACT
  Filled 2020-01-04: qty 6.7

## 2020-01-04 MED ORDER — ALBUTEROL (5 MG/ML) CONTINUOUS INHALATION SOLN
2.5000 mg/h | INHALATION_SOLUTION | Freq: Once | RESPIRATORY_TRACT | Status: DC
  Filled 2020-01-04: qty 20

## 2020-01-04 MED ORDER — PREDNISONE 10 MG PO TABS
ORAL_TABLET | ORAL | 0 refills | Status: DC
Start: 2020-01-04 — End: 2020-03-31

## 2020-01-04 MED ORDER — IPRATROPIUM BROMIDE HFA 17 MCG/ACT IN AERS
2.0000 | INHALATION_SPRAY | Freq: Once | RESPIRATORY_TRACT | Status: AC
  Administered 2020-01-04: 2 via RESPIRATORY_TRACT
  Filled 2020-01-04: qty 12.9

## 2020-01-04 MED ORDER — MAGNESIUM SULFATE 2 GM/50ML IV SOLN
2.0000 g | Freq: Once | INTRAVENOUS | Status: AC
  Administered 2020-01-04: 2 g via INTRAVENOUS
  Filled 2020-01-04: qty 50

## 2020-01-04 MED ORDER — PREDNISONE 10 MG PO TABS
10.0000 mg | ORAL_TABLET | Freq: Every day | ORAL | 0 refills | Status: DC
Start: 2020-01-04 — End: 2020-01-04
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
## 2020-01-04 NOTE — ED Notes (Signed)
All appropriate discharge materials reviewed at length with patient. Time for questions provided. Pt has no other questions at this time and verbalizes understanding of all provided materials.  

## 2020-01-04 NOTE — Discharge Instructions (Signed)
You are seen today for COPD exacerbation, I think this is really important for you to see a pulmonologist.  I refer you to you to 1, they should be calling you.  I want you to use your oxygen as you normally do.  Take the antibiotics as prescribed, stay out of the sun when using these antibiotics.  I also want you to take the prednisone as prescribed.  If you have any new or worsening concerning symptoms such as worsening shortness of breath, chest pain, leg swelling, weakness, difficulty breathing please come back to the emergency department.  Use the  attached instructions.  I think it is very important for you to see your primary care as we discussed, you need to be taking your COPD medications regularly since you do have severe disease.
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
## 2020-01-26 ENCOUNTER — Emergency Department (HOSPITAL_COMMUNITY): Payer: Medicare HMO

## 2020-01-26 ENCOUNTER — Encounter (HOSPITAL_COMMUNITY): Payer: Self-pay | Admitting: Emergency Medicine

## 2020-01-26 ENCOUNTER — Other Ambulatory Visit: Payer: Self-pay

## 2020-01-26 ENCOUNTER — Emergency Department (HOSPITAL_COMMUNITY)
Admission: EM | Admit: 2020-01-26 | Discharge: 2020-01-27 | Disposition: A | Discharge status: Home / Self Care | Payer: Medicare HMO | Attending: Emergency Medicine | Admitting: Emergency Medicine

## 2020-01-26 DIAGNOSIS — I130 Hypertensive heart and chronic kidney disease with heart failure and stage 1 through stage 4 chronic kidney disease, or unspecified chronic kidney disease: Secondary | ICD-10-CM | POA: Insufficient documentation

## 2020-01-26 DIAGNOSIS — Z87891 Personal history of nicotine dependence: Secondary | ICD-10-CM | POA: Insufficient documentation

## 2020-01-26 DIAGNOSIS — R05 Cough: Secondary | ICD-10-CM

## 2020-01-26 DIAGNOSIS — N182 Chronic kidney disease, stage 2 (mild): Secondary | ICD-10-CM | POA: Diagnosis not present

## 2020-01-26 DIAGNOSIS — Z7951 Long term (current) use of inhaled steroids: Secondary | ICD-10-CM | POA: Diagnosis not present

## 2020-01-26 DIAGNOSIS — I509 Heart failure, unspecified: Secondary | ICD-10-CM | POA: Insufficient documentation

## 2020-01-26 DIAGNOSIS — Z7982 Long term (current) use of aspirin: Secondary | ICD-10-CM | POA: Diagnosis not present

## 2020-01-26 DIAGNOSIS — Z79899 Other long term (current) drug therapy: Secondary | ICD-10-CM | POA: Diagnosis not present

## 2020-01-26 DIAGNOSIS — J441 Chronic obstructive pulmonary disease with (acute) exacerbation: Secondary | ICD-10-CM | POA: Insufficient documentation

## 2020-01-26 DIAGNOSIS — Z96659 Presence of unspecified artificial knee joint: Secondary | ICD-10-CM | POA: Insufficient documentation

## 2020-01-26 DIAGNOSIS — R059 Cough, unspecified: Secondary | ICD-10-CM

## 2020-01-26 DIAGNOSIS — J40 Bronchitis, not specified as acute or chronic: Secondary | ICD-10-CM

## 2020-01-26 LAB — CBC WITH DIFFERENTIAL/PLATELET
Abs Immature Granulocytes: 0.06 10*3/uL (ref 0.00–0.07)
Basophils Absolute: 0.1 10*3/uL (ref 0.0–0.1)
Basophils Relative: 1 %
Eosinophils Absolute: 0.2 10*3/uL (ref 0.0–0.5)
Eosinophils Relative: 2 %
HCT: 41.2 % (ref 39.0–52.0)
Hemoglobin: 12.5 g/dL — ABNORMAL LOW (ref 13.0–17.0)
Immature Granulocytes: 1 %
Lymphocytes Relative: 10 %
Lymphs Abs: 1 10*3/uL (ref 0.7–4.0)
MCH: 28.5 pg (ref 26.0–34.0)
MCHC: 30.3 g/dL (ref 30.0–36.0)
MCV: 94.1 fL (ref 80.0–100.0)
Monocytes Absolute: 1 10*3/uL (ref 0.1–1.0)
Monocytes Relative: 9 %
Neutro Abs: 7.8 10*3/uL — ABNORMAL HIGH (ref 1.7–7.7)
Neutrophils Relative %: 77 %
Platelets: 291 10*3/uL (ref 150–400)
RBC: 4.38 MIL/uL (ref 4.22–5.81)
RDW: 14.4 % (ref 11.5–15.5)
WBC: 10.2 10*3/uL (ref 4.0–10.5)
nRBC: 0 % (ref 0.0–0.2)

## 2020-01-26 LAB — BASIC METABOLIC PANEL
Anion gap: 12 (ref 5–15)
BUN: 18 mg/dL (ref 8–23)
CO2: 32 mmol/L (ref 22–32)
Calcium: 9 mg/dL (ref 8.9–10.3)
Chloride: 99 mmol/L (ref 98–111)
Creatinine, Ser: 1.34 mg/dL — ABNORMAL HIGH (ref 0.61–1.24)
GFR calc Af Amer: 60 mL/min — ABNORMAL LOW (ref >60)
GFR calc non Af Amer: 51 mL/min — ABNORMAL LOW (ref >60)
Glucose, Bld: 117 mg/dL — ABNORMAL HIGH (ref 70–99)
Potassium: 3.6 mmol/L (ref 3.5–5.1)
Sodium: 143 mmol/L (ref 135–145)

## 2020-01-26 NOTE — ED Notes (Signed)
Unable to obtain O2 reading, attempted on all fingers and forehead. Pt does not report SOB and states he does not feel like he needs O2 at the moment. Pt removed nasal cannula off that was placed by EMS, states he only uses it at night and will put it on if he feels like he needs it.

## 2020-01-26 NOTE — ED Triage Notes (Signed)
Patient arrived with EMS from home reports persistent cough with chest congestion for several days , denies fever or chills , respirations unlabored.

## 2020-01-27 DIAGNOSIS — J441 Chronic obstructive pulmonary disease with (acute) exacerbation: Secondary | ICD-10-CM | POA: Diagnosis not present

## 2020-01-27 MED ORDER — PREDNISONE 20 MG PO TABS
60.0000 mg | ORAL_TABLET | Freq: Once | ORAL | Status: AC
  Administered 2020-01-27: 60 mg via ORAL
  Filled 2020-01-27: qty 3

## 2020-01-27 MED ORDER — ALBUTEROL SULFATE (5 MG/ML) 0.5% IN NEBU: 2.5000 mg | INHALATION_SOLUTION | Freq: Four times a day (QID) | RESPIRATORY_TRACT | 5 refills | Status: DC | PRN

## 2020-01-27 MED ORDER — CARVEDILOL 3.125 MG PO TABS: 3.1250 mg | ORAL_TABLET | Freq: Two times a day (BID) | ORAL | 0 refills | Status: DC

## 2020-01-27 MED ORDER — PREDNISONE 10 MG PO TABS
ORAL_TABLET | ORAL | 0 refills | Status: AC
Start: 2020-01-28 — End: 2020-02-09

## 2020-01-27 MED ORDER — ALBUTEROL SULFATE HFA 108 (90 BASE) MCG/ACT IN AERS
2.0000 | INHALATION_SPRAY | Freq: Once | RESPIRATORY_TRACT | Status: AC
  Administered 2020-01-27: 2 via RESPIRATORY_TRACT
  Filled 2020-01-27: qty 6.7

## 2020-01-27 MED ORDER — CARVEDILOL 3.125 MG PO TABS
3.1250 mg | ORAL_TABLET | Freq: Two times a day (BID) | ORAL | Status: DC
  Administered 2020-01-27: 3.125 mg via ORAL
  Filled 2020-01-27: qty 1

## 2020-01-27 NOTE — Discharge Planning (Signed)
Transition of Care [facility]) - Emergency Department Mini Assessment   Patient Details  Name: Ray Smith MRN: [id number] Date of Birth: 1946/02/20  Transition of Care [facility]) CM/SW Contact:    Oletta Cohn, RN Phone [phone] 01/27/2020, 8:52 AM   Clinical Narrative: RNCM provided [place] transportation as pt has no transportation from hospital.    ED Mini Assessment: What brought you to the Emergency Department? : cough  Barriers to Discharge: Transportation     Means of departure: Hospital Transport  Interventions which prevented an admission or readmission: Transportation Screening    Patient Contact and Communications        ,                 Admission diagnosis:  sob  Patient Active Problem List   Diagnosis Date Noted  . COPD with acute exacerbation (HCC) 09/03/2019  . Acute respiratory failure (HCC) 09/03/2019  . Acute exacerbation of CHF (congestive heart failure) (HCC) 09/02/2019  . Adjustment disorder with mixed disturbance of emotions and conduct   . Protein-calorie malnutrition, severe 01/10/2018  . Orthostatic hypotension 01/09/2018  . Weakness 01/09/2018  . Mouth pain 01/09/2018  . CKD (chronic kidney disease) stage 2, GFR 60-89 ml/min 01/09/2018  . Marijuana abuse 01/09/2018  . Acute systolic heart failure (HCC) 01/02/2018  . STEMI (ST elevation myocardial infarction) (HCC) 12/30/2017  . STEMI involving left anterior descending coronary artery (HCC) 12/30/2017  . Acute encephalopathy 10/06/2017  . AKI (acute kidney injury) (HCC)   . Altered mental status   . Hypertension 11/12/2013  . Dyslipidemia 10/24/2013  . ACS (acute coronary syndrome) (HCC) 10/23/2013  . Chest pain 10/23/2013  . Coronary Artery Disease 10/23/2013  . COPD (chronic obstructive pulmonary disease) (HCC) 10/23/2013  . Tobacco abuse 10/23/2013  . Precordial pain 10/23/2013   PCP:  System, Pcp Not In Pharmacy:   [id number] - [place], [place] - [address] [address] Phone: [phone] Fax: [phone]  [id number] - [place],  - [address] AT [address] [place] [address] Phone: [phone] Fax: [phone]

## 2020-01-27 NOTE — Discharge Instructions (Addendum)
Instructions:  I gave you prescriptions for prednisone (steroids), albuterol (inhaler medicine), and coreg (your heart failure pills).    You will start taking your next steroid dose TOMORROW morning on 8/4.  This is a long course of 12 days, which should help you from having a rebound of your symptoms.  You should take breathing treatments with albuterol nebulizers every 6 hours for the next 2 days.  Then use them as needed for wheezing.  Take your first treatment as soon as you get home from the ER today.    I also refilled your Coreg prescription, which you ran out of.  You got a dose in the ER today.  Your next dose is due TONIGHT before bedtime.  Finally, for the next 7 days, please take a dose of your lasix medicine (furosemide) in the morning.  This medicine will help you pee out some of the fluid in your lungs.  *  Your workup and xrays did NOT show signs of a new pneumonia today.  *  Please schedule a follow up appointment with your primary care provider in the upcoming week.

## 2020-01-27 NOTE — ED Provider Notes (Signed)
[facility] EMERGENCY DEPARTMENT Provider Note   CSN: [id number] Arrival date & time: 01/26/20  2020     History Chief Complaint  Patient presents with  . Cough    Ray Smith is a 74 y.o. male w/ hx of COPD (on 3L [place] at night only), CHF (EF 25-30%), HTN, HLD, presenting to ED with cough and chest congestion for 4 days.  Patient was last seen in the ED on July 11th and prescribed doxycycline + prednisone for possible PNA and COPD exacerbation.  He reports that within a day of finishing both medications he felt like his symptoms returned.  He reports productive coughing, congestion, shortness of breath.  He also reports that he has run out of his albuterol nebulizer medications and used up the inhaler given to him from the ER.  He additionally reports he ran out of his coreg 3.125 BID medication and hasn't been able to get it refilled.  He has all of his other prescribed medications available in bottles at bedside with many pills left.  He is prescribed lasix 40 mg "up to 3 times daily" but says he hasn't taken this in many days, because "when I take it too much my kidneys hurt."  He states sometimes he will take one pill every 3rd day or so.  He denies new leg swelling or orthopnea.  He reports weight loss over the past few months, no weight gain.  His last echo per our records was 09/03/19 showing EF 25-30%  HPI     Past Medical History:  Diagnosis Date  . Acute ST elevation myocardial infarction (STEMI) involving left anterior descending (LAD) coronary artery (HCC) 12/30/2017  . Acute systolic heart failure (HCC) 12/30/2017  . COPD (chronic obstructive pulmonary disease) (HCC)   . Coronary artery disease    a.  presented with CP and inf STE but no ACS - LHC (10/23/13):  Dist LM 40-60%, ostial LAD 80%, mid LAD 50%, ostial CFX 50-70%, mid RCA 50%.  EF 60%. - CP not felt to be ischemic; CABG vs Med Rx d/w pt - pt opted for Med Rx  . History of kidney stones    "more  than 20" (01/09/2018)  . Hyperlipidemia   . Hypertension   . Marijuana abuse   . MI (mitral incompetence)   . Tobacco dependence     Patient Active Problem List   Diagnosis Date Noted  . COPD with acute exacerbation (HCC) 09/03/2019  . Acute respiratory failure (HCC) 09/03/2019  . Acute exacerbation of CHF (congestive heart failure) (HCC) 09/02/2019  . Adjustment disorder with mixed disturbance of emotions and conduct   . Protein-calorie malnutrition, severe 01/10/2018  . Orthostatic hypotension 01/09/2018  . Weakness 01/09/2018  . Mouth pain 01/09/2018  . CKD (chronic kidney disease) stage 2, GFR 60-89 ml/min 01/09/2018  . Marijuana abuse 01/09/2018  . Acute systolic heart failure (HCC) 01/02/2018  . STEMI (ST elevation myocardial infarction) (HCC) 12/30/2017  . STEMI involving left anterior descending coronary artery (HCC) 12/30/2017  . Acute encephalopathy 10/06/2017  . AKI (acute kidney injury) (HCC)   . Altered mental status   . Hypertension 11/12/2013  . Dyslipidemia 10/24/2013  . ACS (acute coronary syndrome) (HCC) 10/23/2013  . Chest pain 10/23/2013  . Coronary Artery Disease 10/23/2013  . COPD (chronic obstructive pulmonary disease) (HCC) 10/23/2013  . Tobacco abuse 10/23/2013  . Precordial pain 10/23/2013    Past Surgical History:  Procedure Laterality Date  . CARDIAC CATHETERIZATION    .  CORONARY/GRAFT ACUTE MI REVASCULARIZATION N/A 12/30/2017   Procedure: Coronary/Graft Acute MI Revascularization;  Surgeon: Tonny Bollman, MD;  Location: [facility] INVASIVE CV LAB;  Service: Cardiovascular;  Laterality: N/A;  Distal LM into LAD Xience [place] 3.5x75mm Ostial Cx Xience [place] 3.5x52mm  . LEFT HEART CATH AND CORONARY ANGIOGRAPHY N/A 12/30/2017   Procedure: LEFT HEART CATH AND CORONARY ANGIOGRAPHY;  Surgeon: Tonny Bollman, MD;  Location: [facility] INVASIVE CV LAB;  Service: Cardiovascular;  Laterality: N/A;  . LEFT HEART CATHETERIZATION WITH CORONARY ANGIOGRAM Bilateral 10/23/2013     Procedure: LEFT HEART CATHETERIZATION WITH CORONARY ANGIOGRAM;  Surgeon: Lesleigh Noe, MD;  Location: [facility] CATH LAB;  Service: Cardiovascular;  Laterality: Bilateral;  . snake bite surgery    . TONSILLECTOMY  1955  . TOTAL KNEE ARTHROPLASTY  2001       Family History  Problem Relation Age of Onset  . Heart disease Mother   . Alzheimer's disease Father   . Multiple sclerosis Sister   . Diabetes Maternal Grandmother   . Drug abuse Other     Social History   Tobacco Use  . Smoking status: Former Games developer  . Smokeless tobacco: Never Used  . Tobacco comment: Last smoked 20 years ago  Substance Use Topics  . Alcohol use: Not Currently    Comment: Not in 21 days  . Drug use: Never    Types: Marijuana    Home Medications Prior to Admission medications   Medication Sig Start Date End Date Taking? Authorizing Provider  albuterol (PROVENTIL) (5 MG/ML) 0.5% nebulizer solution Take 0.5 mLs (2.5 mg total) by nebulization every 6 (six) hours as needed for wheezing or shortness of breath. 01/27/20   Terald Sleeper, MD  albuterol (VENTOLIN HFA) 108 (90 Base) MCG/ACT inhaler Inhale 2 puffs into the lungs every 6 (six) hours as needed for wheezing or shortness of breath. 09/04/19   Rhetta Mura, MD  albuterol (VENTOLIN HFA) 108 (90 Base) MCG/ACT inhaler Inhale 2 puffs into the lungs every 6 (six) hours as needed for wheezing or shortness of breath.  10/19/19   [provider]  aspirin 81 MG EC tablet Take 1 tablet (81 mg total) by mouth daily. 09/04/19   Rhetta Mura, MD  atorvastatin (LIPITOR) 80 MG tablet Take 1 tablet (80 mg total) by mouth daily at 6 PM. Patient not taking: Reported on 01/04/2020 09/04/19   Rhetta Mura, MD  carvedilol (COREG) 3.125 MG tablet Take 1 tablet (3.125 mg total) by mouth 2 (two) times daily with a meal. Patient not taking: Reported on 01/04/2020 09/04/19   Rhetta Mura, MD  carvedilol (COREG) 3.125 MG tablet Take 3.125 mg by  mouth 2 (two) times daily. Patient not taking: Reported on 01/04/2020 09/04/19   [provider]  carvedilol (COREG) 3.125 MG tablet Take 1 tablet (3.125 mg total) by mouth 2 (two) times daily with a meal. 01/27/20 02/26/20  Tyaisha Cullom, Kermit Balo, MD  clopidogrel (PLAVIX) 75 MG tablet Take 1 tablet (75 mg total) by mouth daily. Please make yearly appt with Dr. Katrinka Blazing for October for future refills. 1st attempt 12/11/19   Lyn Records, MD  clopidogrel (PLAVIX) 75 MG tablet Take 75 mg by mouth daily. Patient not taking: Reported on 01/04/2020 12/11/19   [provider]  furosemide (LASIX) 40 MG tablet Take 1 tablet (40 mg total) by mouth every other day. If u feel short of breath, you may take daily for 3 days then go back to every other day Patient not  taking: Reported on 01/04/2020 09/04/19 09/03/20  Rhetta Mura, MD  furosemide (LASIX) 40 MG tablet Take 40 mg by mouth See admin instructions. 1 TAB EVERY OTHER DAY. IF FEEL SHORT OF BREATH, TAKE DAILY X3 DAYS THEN RETURN TO EVERY OTHER DAY Patient not taking: Reported on 12/21/2019 11/30/19   [provider]  nitroGLYCERIN (NITROSTAT) 0.4 MG SL tablet Place 1 tablet (0.4 mg total) under the tongue every 5 (five) minutes x 3 doses as needed for chest pain. 09/04/19   Rhetta Mura, MD  nitroGLYCERIN (NITROSTAT) 0.4 MG SL tablet Place 0.4 mg under the tongue every 5 (five) minutes x 3 doses as needed for chest pain.  Patient not taking: Reported on 01/04/2020 10/25/19   [provider]  oxyCODONE-acetaminophen (PERCOCET/ROXICET) 5-325 MG tablet Take 1 tablet by mouth every 4 (four) hours as needed. Patient not taking: Reported on 01/04/2020 12/21/19   Tegeler, Canary Brim, MD  OXYGEN Inhale 3 L/min into the lungs as needed (for shortness of breath).     [provider]  pantoprazole (PROTONIX) 40 MG tablet Take 1 tablet (40 mg total) by mouth at bedtime. Patient not taking: Reported on 01/04/2020 09/04/19   Rhetta Mura, MD  pantoprazole (PROTONIX) 40 MG tablet Take 40 mg by mouth at bedtime. Patient not taking: Reported on 01/04/2020 10/03/19   [provider]  predniSONE (DELTASONE) 10 MG tablet Take 60mg  today, then take 40mg  the next 5 days. 01/04/20   , PA-C  predniSONE (DELTASONE) 10 MG tablet Take 6 tablets (60 mg total) by mouth daily with breakfast for 4 days, THEN 4 tablets (40 mg total) daily with breakfast for 4 days, THEN 2 tablets (20 mg total) daily with breakfast for 4 days. 01/28/20 02/09/20  03/29/20, MD    Allergies    Tramadol, Flexeril [cyclobenzaprine], Ibuprofen, Ibuprofen, and Lactose intolerance (gi)  Review of Systems   Review of Systems  Constitutional: Negative for chills and fever.  HENT: Negative for ear pain and sore throat.   Eyes: Negative for photophobia and visual disturbance.  Respiratory: Positive for cough, chest tightness, shortness of breath and wheezing.   Cardiovascular: Negative for chest pain and palpitations.  Gastrointestinal: Negative for abdominal pain and vomiting.  Genitourinary: Negative for dysuria and hematuria.  Musculoskeletal: Negative for arthralgias and myalgias.  Skin: Negative for color change and rash.  Neurological: Negative for syncope and headaches.  All other systems reviewed and are negative.   Physical Exam Updated Vital Signs BP (!) 157/97 (BP Location: Right Arm)   Pulse 87   Temp 97.8 F (36.6 C) (Oral)   Resp 17   Ht 5\' 10"  (1.778 m)   Wt 60 kg   SpO2 97%   BMI 18.98 kg/m   Physical Exam Vitals and nursing note reviewed.  Constitutional:      Appearance: He is well-developed.     Comments: Thin habitus  HENT:     Head: Normocephalic and atraumatic.  Eyes:     Conjunctiva/sclera: Conjunctivae normal.  Cardiovascular:     Rate and Rhythm: Normal rate.     Pulses: Normal pulses.  Pulmonary:     Effort: Pulmonary effort is normal. No respiratory distress.     Comments: 98% on  room air, speaking comfortably Lung fields with mild expiratory wheezing Abdominal:     Palpations: Abdomen is soft.     Tenderness: There is no abdominal tenderness.  Musculoskeletal:     Cervical back: Neck supple.  Skin:  General: Skin is warm and dry.  Neurological:     General: No focal deficit present.     Mental Status: He is alert and oriented to person, place, and time.  Psychiatric:        Mood and Affect: Mood normal.        Behavior: Behavior normal.     ED Results / Procedures / Treatments   Labs (all labs ordered are listed, but only abnormal results are displayed) Labs Reviewed  CBC WITH DIFFERENTIAL/PLATELET - Abnormal; Notable for the following components:      Result Value   Hemoglobin 12.5 (*)    Neutro Abs 7.8 (*)    All other components within normal limits  BASIC METABOLIC PANEL - Abnormal; Notable for the following components:   Glucose, Bld 117 (*)    Creatinine, Ser 1.34 (*)    GFR calc non Af Amer 51 (*)    GFR calc Af Amer 60 (*)    All other components within normal limits    EKG  Radiology DG Chest 2 View  Result Date: 01/26/2020 CLINICAL DATA:  Persistent cough and chest congestion. EXAM: CHEST - 2 VIEW COMPARISON:  January 04, 2020 FINDINGS: The lungs are hyperinflated. Mild, stable linear scarring is seen along the medial aspect of the left lung base. There is no evidence of acute infiltrate, pleural effusion or pneumothorax. The heart size and mediastinal contours are within normal limits. There is moderate severity calcification of the aortic arch. The visualized skeletal structures are unremarkable. IMPRESSION: No active cardiopulmonary disease. Electronically Signed   By: Aram Candela M.D.   On: 01/26/2020 21:40    Procedures Procedures (including critical care time)  Medications Ordered in ED Medications  carvedilol (COREG) tablet 3.125 mg (3.125 mg Oral Given 01/27/20 0829)  albuterol (VENTOLIN HFA) 108 (90 Base) MCG/ACT inhaler  2 puff (2 puffs Inhalation Given 01/27/20 0830)  predniSONE (DELTASONE) tablet 60 mg (60 mg Oral Given 01/27/20 1610)    ED Course  I have reviewed the triage vital signs and the nursing notes.  Pertinent labs & imaging results that were available during my care of the patient were reviewed by me and considered in my medical decision making (see chart for details).  This is a 74 yo male w/ CHF as noted above, COPD (not on daytime oxygen) presenting with suspected rebound COPD exacerbation after completion of recent prednisone course.  He reports productive cough.  I personally reviewed his xrays and agree there is no consolidation concerning for PNA here.  He has no leukocytosis or SIRS criteria concerning for pulmonary infection or sepsis.  I suspect this is likely chronic bronchitis with COPD.  I'll prescribe a longer 12 day course of prednisone on a taper (with first dose here in the ED).  Bedside US shows some B-lines only in the lower lung fields consistent with mild pulm congestion.  Xray and clinical exam do not suggest significant volume overload or pulmonary edema.  He is not hypoxic.  I advised compliance with his lasix (at least a 7 day course) to help diuresis him a bit at home, and think he is stable to f/u with his heart doctor for his next scheduled visit.  ECG per my interpretation shows no acute ischemic changes, bifascicular block noted on prior tracing.  He has no chest pressure or pain.  Doubtful of ACS at this time.  Likewise doubt PE with no hypoxia, tachypnea at this time.  I personally reviewed his lab.s  BMP with Cr near baseline at 1.3.  CBC unremarkable, hgb 12.5, WBC 10.2.    I reviewed his PMHx including last echocardiogram  Will prescribe albuterol nebulizers, refill coreg script for home.  He is eager to get home.  Will discharge.  Final Clinical Impression(s) / ED Diagnoses Final diagnoses:  Cough  Bronchitis  COPD exacerbation (HCC)    Rx / DC Orders ED  Discharge Orders         Ordered    predniSONE (DELTASONE) 10 MG tablet     Discontinue  Reprint     01/27/20 0818    albuterol (PROVENTIL) (5 MG/ML) 0.5% nebulizer solution  Every 6 hours PRN     Discontinue  Reprint     01/27/20 0818    carvedilol (COREG) 3.125 MG tablet  2 times daily with meals     Discontinue  Reprint     01/27/20 0818           Terald Sleeper, MD 01/27/20 [phone]

## 2020-03-03 ENCOUNTER — Other Ambulatory Visit: Payer: Self-pay | Admitting: Interventional Cardiology

## 2020-03-31 ENCOUNTER — Ambulatory Visit (INDEPENDENT_AMBULATORY_CARE_PROVIDER_SITE_OTHER): Payer: Medicare HMO | Admitting: Pulmonary Disease

## 2020-03-31 ENCOUNTER — Other Ambulatory Visit: Payer: Self-pay

## 2020-03-31 ENCOUNTER — Encounter: Payer: Self-pay | Admitting: Pulmonary Disease

## 2020-03-31 VITALS — BP 110/62 | HR 80 | Temp 97.7°F | Ht 70.0 in | Wt 125.4 lb

## 2020-03-31 DIAGNOSIS — K219 Gastro-esophageal reflux disease without esophagitis: Secondary | ICD-10-CM | POA: Diagnosis not present

## 2020-03-31 DIAGNOSIS — J449 Chronic obstructive pulmonary disease, unspecified: Secondary | ICD-10-CM | POA: Diagnosis not present

## 2020-03-31 DIAGNOSIS — J310 Chronic rhinitis: Secondary | ICD-10-CM | POA: Diagnosis not present

## 2020-03-31 MED ORDER — STIOLTO RESPIMAT 2.5-2.5 MCG/ACT IN AERS: 2.0000 | INHALATION_SPRAY | Freq: Every day | RESPIRATORY_TRACT | 6 refills | Status: DC

## 2020-03-31 MED ORDER — PANTOPRAZOLE SODIUM 40 MG PO TBEC: 40.0000 mg | DELAYED_RELEASE_TABLET | Freq: Every day | ORAL | 1 refills | Status: DC

## 2020-03-31 MED ORDER — ALBUTEROL SULFATE HFA 108 (90 BASE) MCG/ACT IN AERS: 2.0000 | INHALATION_SPRAY | Freq: Four times a day (QID) | RESPIRATORY_TRACT | 6 refills | Status: DC | PRN

## 2020-03-31 MED ORDER — ALBUTEROL SULFATE (5 MG/ML) 0.5% IN NEBU: 2.5000 mg | INHALATION_SOLUTION | Freq: Four times a day (QID) | RESPIRATORY_TRACT | 5 refills | Status: DC | PRN

## 2020-03-31 MED ORDER — IPRATROPIUM BROMIDE 0.03 % NA SOLN: 2.0000 | Freq: Two times a day (BID) | NASAL | 12 refills | Status: DC

## 2020-03-31 NOTE — Patient Instructions (Signed)
Start Stiolto Respimat Inahler 2 puffs daily Use albuterol inhaler 1-2 puffs every 4-6 hours as needed or Use albuterol nebulizer every 6 hours as needed Follow up in 3 months with pulmonary function tests
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
## 2020-04-01 ENCOUNTER — Telehealth: Payer: Self-pay | Admitting: Pulmonary Disease

## 2020-04-01 DIAGNOSIS — J449 Chronic obstructive pulmonary disease, unspecified: Secondary | ICD-10-CM

## 2020-04-01 MED ORDER — ALBUTEROL SULFATE (5 MG/ML) 0.5% IN NEBU: 2.5000 mg | INHALATION_SOLUTION | Freq: Four times a day (QID) | RESPIRATORY_TRACT | 5 refills | Status: DC | PRN

## 2020-04-01 NOTE — Telephone Encounter (Signed)
Spoke with pt. He would like for the albuterol neb medication to be sent to [facility] [address]. Rx has been sent in. Nothing further was needed.

## 2020-04-05 ENCOUNTER — Telehealth: Payer: Self-pay | Admitting: Pulmonary Disease

## 2020-04-05 NOTE — Telephone Encounter (Signed)
ATC pt, there was no answer and her VM wasn't set up. At his last OV, Dr. Francine Graven advised him that he would need to make appointments with his PCP and Cardiologist for refills on his medications.

## 2020-04-06 ENCOUNTER — Telehealth: Payer: Self-pay | Admitting: Cardiology

## 2020-04-06 NOTE — Telephone Encounter (Signed)
Called and spoke to pt. Pt states he is needing Plavix refilled. However a 90 day script was sent to pharmacy on 03/03/20. Pt states they did not give the pt 90 days only 30 days. Per pt's chart he is suppose to call and schedule an OV with cards for a refill. Pt states he has tried but has been unsuccessful and requested I call.   Called and spoke [facility] and spoke to [place]. She has schedule pt for an OV on November 1st at 3pm with Dr. Anne Fu and has sent a message back to send refill in for pt.   ATC pt back to inform him but it went to VM which the VM box is full. WCB and follow up in chart for cardiology's response to refill.

## 2020-04-06 NOTE — Telephone Encounter (Signed)
°*  STAT* If patient is at the pharmacy, call can be transferred to refill team.   1. Which medications need to be refilled? (please list name of each medication and dose if known)  Clopidogrel   2. Which pharmacy/location (including street and city if local pharmacy) is medication to be sent to? [facility]   3. Do they need a 30 day or 90 day supply? Not sure patient only has 2 left

## 2020-04-06 NOTE — Telephone Encounter (Signed)
Called pt to inform him that his pharmacy already has this medication. I called the pt's pharmacy and they stated that pt never picked it up, but they will refill it again. I explained this to the pt. I advised the pt that if he has any other problems, questions or concerns, to give our office a call back. Pt verbalized understanding.

## 2020-04-08 NOTE — Telephone Encounter (Signed)
Called and spoke to pt. Informed him of the appt with cardiology and pt states he has his plavix from cards. Pt is requesting we mail him a list of PCP offices that he can call to schedule an OV to establish care.   Triage, can you print and mail [place] [facility]CP offices to mail to pt so he can contact them to make an appt? Home address verified. Can close message after information has been mailed to pt. Thanks.

## 2020-04-08 NOTE — Telephone Encounter (Signed)
Letter of all of the PCP offices for [facility] mailed to the pt.

## 2020-04-22 ENCOUNTER — Other Ambulatory Visit: Payer: Self-pay | Admitting: Pulmonary Disease

## 2020-04-22 DIAGNOSIS — K219 Gastro-esophageal reflux disease without esophagitis: Secondary | ICD-10-CM

## 2020-04-26 ENCOUNTER — Telehealth: Payer: Self-pay | Admitting: Cardiology

## 2020-04-26 ENCOUNTER — Ambulatory Visit: Payer: Medicare HMO | Admitting: Cardiology

## 2020-04-26 NOTE — Telephone Encounter (Signed)
Patient missed his appt today with Dr. Anne Fu. He states that it is our fault because we gave him the wrong directions. Dr. Anne Fu next appt is 11/24, he states that this is too far out and he really needs to see the doctor. I checked the APP's schedule and nothing earlier with them either. Patient became angry and said that he will find another cardiologist if we cannot find him an earlier appt than 11/24. Please advise.

## 2020-04-26 NOTE — Telephone Encounter (Signed)
Attempted to contact pt to offer an appt with Dr Anne Fu on Thursday this week at 8:40 but no answer and no VM.  Will attempted to call back.

## 2020-04-27 NOTE — Progress Notes (Signed)
Telehealth Visit     Virtual Visit via Telephone Note   This visit type was conducted due to national recommendations for restrictions regarding the COVID-19 Pandemic (e.g. social distancing) in an effort to limit this patient's exposure and mitigate transmission in our community.  Due to his co-morbid illnesses, this patient is at least at moderate risk for complications without adequate follow up.  This format is felt to be most appropriate for this patient at this time.  The patient did not have access to video technology/had technical difficulties with video requiring transitioning to audio format only (telephone).  All issues noted in this document were discussed and addressed.  No physical exam could be performed with this format.  Please refer to the patient's chart for his  consent to telehealth for [facility].   Evaluation Performed:  Follow-up visit   The patient was identified using 2 identifiers.   This visit type was conducted due to national recommendations for restrictions regarding the COVID-19 Pandemic (e.g. social distancing).  This format is felt to be most appropriate for this patient at this time.  All issues noted in this document were discussed and addressed.  No physical exam was performed (except for noted visual exam findings with Video Visits).  Please refer to the patient's chart (MyChart message for video visits and phone note for telephone visits) for the patient's consent to telehealth for [facility].  Date:  04/28/2020   ID:  Ray Smith, DOB 12-30-1945, MRN [id number]  Patient Location:  Home  Provider location:   Home  PCP:  Pcp, No  Cardiologist:   Donato Schultz, MD  Electrophysiologist:  None   Chief Complaint:  Follow up  History of Present Illness:    Ray Smith is a 74 y.o. male who presents via audio/video conferencing for a telehealth visit today.  Seen for Dr. Anne Fu.   He has a known history of CAD s/p PCI/DES to left main  stem/proximal LAD 12/2017, acute systolic HF, tobacco use, HLD, and HTN.   He was initially seen by our service 12/2017 after presenting to [facility] as a STEMI. He underwent cardiac cath with Dr. Excell Seltzer withdistal left main stenosis, total occlusion ostial LAD and severe stenosis ostial Circumflex. This was treated with stenting from the left main into the LAD and the Circumflex with an excellent result.Placed on DAPT with ASA/plavix indefinitely. He was also placed on high dose statin, and low dose BB. LV gram noted EF of <25%. Initially he refused to have follow up echo done, but did consent. Echo showed EF of 25-30%, akinesis of the anteroseptal, anterior, anterolateral, lateral, inferolateral, and apical myocardium. No LV thrombus noted. During admission struggled with issues on non compliance and was difficult with the staff. LDL noted at 130. Trop peaked at >65 x2. PT was consulted but he refused. No further chest pain post cath. Considered lifevest, but given his medical noncompliance would not consider him a good candidate for therapy as he would not wear the vest.   He was last seen by Dr. Anne Fu in the hospital when he had his STEMI. He was last seen by Noreene Larsson a little over one year ago for a telehealth visit. He had run out of all his medicines other than aspirin and Plavix - was out of Coreg, spironolactone, and statin. Had continued to smoke.   Missed appointment earlier this week with Dr. Anne Fu. Thus added to this schedule for today.   The patient does not have symptoms  concerning for COVID-19 infection (fever, chills, cough, or new shortness of breath).   Seen today by telephone call. He has consented for this visit. He needs medicines refilled. Only has a few days left. Smoking some marijuana at times. No chest pain. Says he feels great. Has some cramps in his legs - happens in the mornings - not with walking. He has stable DOE - he notes he has COPD. He is not dizzy. Feels like he is doing  well and he has no concerns.   Past Medical History:  Diagnosis Date  . Acute ST elevation myocardial infarction (STEMI) involving left anterior descending (LAD) coronary artery (HCC) 12/30/2017  . Acute systolic heart failure (HCC) 12/30/2017  . COPD (chronic obstructive pulmonary disease) (HCC)   . Coronary artery disease    a.  presented with CP and inf STE but no ACS - LHC (10/23/13):  Dist LM 40-60%, ostial LAD 80%, mid LAD 50%, ostial CFX 50-70%, mid RCA 50%.  EF 60%. - CP not felt to be ischemic; CABG vs Med Rx d/w pt - pt opted for Med Rx  . History of kidney stones    "more than 20" (01/09/2018)  . Hyperlipidemia   . Hypertension   . Marijuana abuse   . MI (mitral incompetence)   . Tobacco dependence    Past Surgical History:  Procedure Laterality Date  . CARDIAC CATHETERIZATION    . CORONARY/GRAFT ACUTE MI REVASCULARIZATION N/A 12/30/2017   Procedure: Coronary/Graft Acute MI Revascularization;  Surgeon: Tonny Bollman, MD;  Location: [facility] INVASIVE CV LAB;  Service: Cardiovascular;  Laterality: N/A;  Distal LM into LAD Xience SIerra 3.5x43mm Ostial Cx Xience [place] 3.5x85mm  . LEFT HEART CATH AND CORONARY ANGIOGRAPHY N/A 12/30/2017   Procedure: LEFT HEART CATH AND CORONARY ANGIOGRAPHY;  Surgeon: Tonny Bollman, MD;  Location: [facility] INVASIVE CV LAB;  Service: Cardiovascular;  Laterality: N/A;  . LEFT HEART CATHETERIZATION WITH CORONARY ANGIOGRAM Bilateral 10/23/2013   Procedure: LEFT HEART CATHETERIZATION WITH CORONARY ANGIOGRAM;  Surgeon: Lesleigh Noe, MD;  Location: [facility] CATH LAB;  Service: Cardiovascular;  Laterality: Bilateral;  . snake bite surgery    . TONSILLECTOMY  1955  . TOTAL KNEE ARTHROPLASTY  2001     Current Meds  Medication Sig  . albuterol (PROVENTIL) (5 MG/ML) 0.5% nebulizer solution Take 0.5 mLs (2.5 mg total) by nebulization every 6 (six) hours as needed for wheezing or shortness of breath.  Marland Kitchen aspirin 81 MG EC tablet Take 1 tablet (81 mg total) by mouth daily.    Marland Kitchen atorvastatin (LIPITOR) 80 MG tablet Take 1 tablet (80 mg total) by mouth daily at 6 PM.  . carvedilol (COREG) 3.125 MG tablet Take 1 tablet (3.125 mg total) by mouth 2 (two) times daily with a meal.  . clopidogrel (PLAVIX) 75 MG tablet Take 1 tablet (75 mg total) by mouth daily.  . furosemide (LASIX) 40 MG tablet Take 1 tablet (40 mg total) by mouth every other day. If u feel short of breath, you may take daily for 3 days then go back to every other day  . ipratropium (ATROVENT) 0.03 % nasal spray Place 2 sprays into both nostrils every 12 (twelve) hours.  . nitroGLYCERIN (NITROSTAT) 0.4 MG SL tablet Place 1 tablet (0.4 mg total) under the tongue every 5 (five) minutes x 3 doses as needed for chest pain.  . OXYGEN Inhale 3 L/min into the lungs as needed (for shortness of breath).   . pantoprazole (PROTONIX) 40 MG tablet  TAKE 1 TABLET BY MOUTH EVERYDAY AT BEDTIME  . [DISCONTINUED] atorvastatin (LIPITOR) 80 MG tablet Take 1 tablet (80 mg total) by mouth daily at 6 PM.  . [DISCONTINUED] carvedilol (COREG) 3.125 MG tablet Take 1 tablet (3.125 mg total) by mouth 2 (two) times daily with a meal.  . [DISCONTINUED] clopidogrel (PLAVIX) 75 MG tablet Take 1 tablet (75 mg total) by mouth daily. Please make yearly appt with Dr. Katrinka Blazing for October before anymore refills. 1st attempt  . [DISCONTINUED] furosemide (LASIX) 40 MG tablet Take 1 tablet (40 mg total) by mouth every other day. If u feel short of breath, you may take daily for 3 days then go back to every other day  . [DISCONTINUED] pantoprazole (PROTONIX) 40 MG tablet TAKE 1 TABLET BY MOUTH EVERYDAY AT BEDTIME  . [DISCONTINUED] Tiotropium Bromide-Olodaterol (STIOLTO RESPIMAT) 2.5-2.5 MCG/ACT AERS Inhale 2 puffs into the lungs daily.     Allergies:   Tramadol, Flexeril [cyclobenzaprine], Ibuprofen, Ibuprofen, and Lactose intolerance (gi)   Social History   Tobacco Use  . Smoking status: Former Games developer  . Smokeless tobacco: Never Used  . Tobacco  comment: Last smoked 20 years ago  Substance Use Topics  . Alcohol use: Not Currently    Comment: Not in 21 days  . Drug use: Never    Types: Marijuana     Family Hx: The patient's family history includes Alzheimer's disease in his father; Diabetes in his maternal grandmother; Drug abuse in an other family member; Heart disease in his mother; Multiple sclerosis in his sister.  ROS:   Please see the history of present illness.   All other systems reviewed are negative. .    Objective:    Vital Signs:  BP 125/76   Pulse 67   Ht 5\' 10"  (1.778 m)   Wt 128 lb (58.1 kg)   BMI 18.37 kg/m    Wt Readings from Last 3 Encounters:  04/28/20 128 lb (58.1 kg)  03/31/20 125 lb 6.4 oz (56.9 kg)  01/26/20 132 lb 4.4 oz (60 kg)    Alert male in no acute distress. Little hard of hearing.    Labs/Other Tests and Data Reviewed:    Lab Results  Component Value Date   WBC 10.2 01/26/2020   HGB 12.5 (L) 01/26/2020   HCT 41.2 01/26/2020   PLT 291 01/26/2020   GLUCOSE 117 (H) 01/26/2020   CHOL 237 (H) 09/04/2019   TRIG 126 09/04/2019   HDL 48 09/04/2019   LDLCALC 164 (H) 09/04/2019   ALT 9 01/04/2020   AST 16 01/04/2020   NA 143 01/26/2020   K 3.6 01/26/2020   CL 99 01/26/2020   CREATININE 1.34 (H) 01/26/2020   BUN 18 01/26/2020   CO2 32 01/26/2020   TSH 1.150 10/24/2013   INR 1.0 12/21/2019   HGBA1C 5.4 12/30/2017     BNP (last 3 results) Recent Labs    09/02/19 1920  BNP 2,357.3*    ProBNP (last 3 results) No results for input(s): PROBNP in the last 8760 hours.    Prior CV studies:    The following studies were reviewed today:  ECHO IMPRESSIONS 08/2019  1. Left ventricular ejection fraction, by estimation, is 25 to 30%. The  left ventricle has severely decreased function. The left ventricle  demonstrates global hypokinesis. There is moderate left ventricular  hypertrophy. Left ventricular diastolic  parameters are consistent with Grade I diastolic dysfunction  (impaired  relaxation). Elevated left ventricular end-diastolic pressure. There is  severe  akinesis of the left ventricular, entire apical segment,  anteroseptal wall, anterior wall and distal  inferoapical.  2. Right ventricular systolic function is normal. The right ventricular  size is normal.  3. The mitral valve is grossly normal. Trivial mitral valve  regurgitation.  4. The aortic valve is tricuspid. Aortic valve regurgitation is not  visualized.    Cath: 12/30/17 Conclusion     Mid RCA lesion is 70% stenosed.  Mid LM to Dist LM lesion is 90% stenosed.  Ost LAD to Prox LAD lesion is 100% stenosed.  Ost Cx to Prox Cx lesion is 90% stenosed.  Prox Cx to Mid Cx lesion is 50% stenosed.  Prox LAD to Mid LAD lesion is 40% stenosed.  Mid LAD lesion is 50% stenosed.  Ost 1st Diag to 1st Diag lesion is 40% stenosed.  A drug-eluting stent was successfully placed using a STENT SIERRA 3.50 X 33 MM.  Post intervention, there is a 0% residual stenosis.  Post intervention, there is a 0% residual stenosis.  A drug-eluting stent was successfully placed using a STENT SIERRA 3.50 X 23 MM.  Post intervention, there is a 0% residual stenosis.  There is severe left ventricular systolic dysfunction.  LV end diastolic pressure is normal.  The left ventricular ejection fraction is 25-35% by visual estimate.  1. Severe distal left main stenosis 2. Acute total occlusion of the proximal LAD 3. Severe ostial left circumflex stenosis 4. Moderate mid RCA stenosis 5. Severe segmental LV systolic dysfunction consistent with large anterolateral infarction with LVEF estimated at 25% 6. Successful complex PCI of the left mainstem/proximal LAD/proximal circumflex as outlined above  Recommend dual antiplatelet therapy with Aspirin  daily and Clopidogrel  dailylong-term (beyond 12 months) because of complex left main bifurcation PCI in the setting of anterior MI.    TTE: 01/01/18 Study Conclusions  - Left ventricle: Systolic function was severely reduced. The estimated ejection fraction was in the range of 25% to 30%. Akinesis of the anteroseptal, anterior, anterolateral, lateral, inferolateral, and apical myocardium. - Aortic valve: Valve mobility was mildly restricted. - Mitral valve: Calcified annulus. - Right ventricle: The cavity size was normal. Wall thickness was increased. - No evidence of LV thrombus with the use of eco contrast to define borders.  Impressions:  - Severely reduced LV ejection fraction with akinesis in multiple territories consistent with LAD and circumflex disease.    ASSESSMENT & PLAN:    1. CAD - prior PCI to LAD from 12/2017 - he remains on DAPT. He is smoking marijuana. No worrisome symptoms. CV risk factor modification challenging.   2. HLD - on statin - refilled today - needs labs - will do in a few months.   3. Ischemic CM - volume status sounds ok. Echo from March of 2021 without real improvement. On low dose beta blocker. Not on ideal therapy. Would like to get better in person assessment and labs.   4. Substance abuse  5. General health maintenance - needs PCP - will send him list of options for primary care.    Patient Risk:   After full review of this patient's clinical status, I feel that they are at least moderate risk at this time.  Time:   Today, I have spent 5 minutes with the patient with telehealth technology discussing the above issues.     Medication Adjustments/Labs and Tests Ordered: Current medicines are reviewed at length with the patient today.  Concerns regarding medicines are outlined above.   Tests  Ordered: No orders of the defined types were placed in this encounter.   Medication Changes: Meds ordered this encounter  Medications  . furosemide (LASIX) 40 MG tablet    Sig: Take 1 tablet (40 mg total) by mouth every other day. If u feel short of breath, you  may take daily for 3 days then go back to every other day    Dispense:  90 tablet    Refill:  0  . pantoprazole (PROTONIX) 40 MG tablet    Sig: TAKE 1 TABLET BY MOUTH EVERYDAY AT BEDTIME    Dispense:  90 tablet    Refill:  3  . clopidogrel (PLAVIX) 75 MG tablet    Sig: Take 1 tablet (75 mg total) by mouth daily.    Dispense:  90 tablet    Refill:  3  . atorvastatin (LIPITOR) 80 MG tablet    Sig: Take 1 tablet (80 mg total) by mouth daily at 6 PM.    Dispense:  90 tablet    Refill:  3  . carvedilol (COREG) 3.125 MG tablet    Sig: Take 1 tablet (3.125 mg total) by mouth 2 (two) times daily with a meal.    Dispense:  180 tablet    Refill:  3    Disposition:  FU with Dr. Anne Fu in 2 to 3 months with fasting labs and EKG.     Patient is agreeable to this plan and will call if any problems develop in the interim.   Avelina Laine, NP  04/28/2020 1:56 PM    [facility]

## 2020-04-27 NOTE — Telephone Encounter (Signed)
Attempted again to contact pt to schedule his f/u appt.  NA and no voicemail has been set up.

## 2020-04-28 ENCOUNTER — Telehealth: Payer: Self-pay | Admitting: *Deleted

## 2020-04-28 ENCOUNTER — Telehealth (INDEPENDENT_AMBULATORY_CARE_PROVIDER_SITE_OTHER): Payer: Medicare HMO | Admitting: Nurse Practitioner

## 2020-04-28 ENCOUNTER — Encounter: Payer: Self-pay | Admitting: Nurse Practitioner

## 2020-04-28 ENCOUNTER — Other Ambulatory Visit: Payer: Self-pay

## 2020-04-28 VITALS — BP 125/76 | HR 67 | Ht 70.0 in | Wt 128.0 lb

## 2020-04-28 DIAGNOSIS — I10 Essential (primary) hypertension: Secondary | ICD-10-CM | POA: Diagnosis not present

## 2020-04-28 DIAGNOSIS — I25118 Atherosclerotic heart disease of native coronary artery with other forms of angina pectoris: Secondary | ICD-10-CM

## 2020-04-28 DIAGNOSIS — E785 Hyperlipidemia, unspecified: Secondary | ICD-10-CM

## 2020-04-28 DIAGNOSIS — I255 Ischemic cardiomyopathy: Secondary | ICD-10-CM | POA: Diagnosis not present

## 2020-04-28 DIAGNOSIS — K219 Gastro-esophageal reflux disease without esophagitis: Secondary | ICD-10-CM

## 2020-04-28 DIAGNOSIS — Z720 Tobacco use: Secondary | ICD-10-CM | POA: Diagnosis not present

## 2020-04-28 MED ORDER — PANTOPRAZOLE SODIUM 40 MG PO TBEC: DELAYED_RELEASE_TABLET | ORAL | 3 refills | Status: DC

## 2020-04-28 MED ORDER — FUROSEMIDE 40 MG PO TABS: 40.0000 mg | ORAL_TABLET | ORAL | 0 refills | Status: DC

## 2020-04-28 MED ORDER — ATORVASTATIN CALCIUM 80 MG PO TABS: 80.0000 mg | ORAL_TABLET | Freq: Every day | ORAL | 3 refills | Status: DC

## 2020-04-28 MED ORDER — CARVEDILOL 3.125 MG PO TABS
3.1250 mg | ORAL_TABLET | Freq: Two times a day (BID) | ORAL | 3 refills | Status: DC
Start: 2020-04-28 — End: 2020-11-03

## 2020-04-28 MED ORDER — CLOPIDOGREL BISULFATE 75 MG PO TABS: 75.0000 mg | ORAL_TABLET | Freq: Every day | ORAL | 3 refills | Status: DC

## 2020-04-28 NOTE — Patient Instructions (Addendum)
After Visit Summary:  We will be checking the following labs today - NONE  We will do fasting labs at your in person visit in 2 to 3 months.    Medication Instructions:    Continue with your current medicines.    If you need a refill on your cardiac medications before your next appointment, please call your pharmacy.     Testing/Procedures To Be Arranged:  N/A  Follow-Up:   See Dr. Anne Fu in 2 to 3 months with fasting labs and EKG  We will send you a list of primary doctors     At [facility], you and your health needs are our priority.  As part of our continuing mission to provide you with exceptional heart care, we have created designated Provider Care Teams.  These Care Teams include your primary Cardiologist (physician) and Advanced Practice Providers (APPs -  Physician Assistants and Nurse Practitioners) who all work together to provide you with the care you need, when you need it.  Special Instructions:  . Stay safe, wash your hands for at least 20 seconds and wear a mask when needed.  . It was good to talk with you today.    Call the [facility] office at [phone] if you have any questions, problems or concerns.

## 2020-04-28 NOTE — Telephone Encounter (Signed)
Pt had a virtual visit today with Norma Fredrickson, NP.  He is scheduled with Dr Anne Fu 08/13/2020.

## 2020-04-28 NOTE — Telephone Encounter (Signed)
  Patient Consent for Virtual Visit         Ray Smith has provided verbal consent on 04/28/2020 for a virtual visit (video or telephone).   CONSENT FOR VIRTUAL VISIT FOR:  Ray Smith  By participating in this virtual visit I agree to the following:  I hereby voluntarily request, consent and authorize [facility] and its employed or contracted physicians, physician assistants, nurse practitioners or other licensed health care professionals (the Practitioner), to provide me with telemedicine health care services (the "Services") as deemed necessary by the treating Practitioner. I acknowledge and consent to receive the Services by the Practitioner via telemedicine. I understand that the telemedicine visit will involve communicating with the Practitioner through live audiovisual communication technology and the disclosure of certain medical information by electronic transmission. I acknowledge that I have been given the opportunity to request an in-person assessment or other available alternative prior to the telemedicine visit and am voluntarily participating in the telemedicine visit.  I understand that I have the right to withhold or withdraw my consent to the use of telemedicine in the course of my care at any time, without affecting my right to future care or treatment, and that the Practitioner or I may terminate the telemedicine visit at any time. I understand that I have the right to inspect all information obtained and/or recorded in the course of the telemedicine visit and may receive copies of available information for a reasonable fee.  I understand that some of the potential risks of receiving the Services via telemedicine include:  Marland Kitchen Delay or interruption in medical evaluation due to technological equipment failure or disruption; . Information transmitted may not be sufficient (e.g. poor resolution of images) to allow for appropriate medical decision making by the Practitioner;  and/or  . In rare instances, security protocols could fail, causing a breach of personal health information.  Furthermore, I acknowledge that it is my responsibility to provide information about my medical history, conditions and care that is complete and accurate to the best of my ability. I acknowledge that Practitioner's advice, recommendations, and/or decision may be based on factors not within their control, such as incomplete or inaccurate data provided by me or distortions of diagnostic images or specimens that may result from electronic transmissions. I understand that the practice of medicine is not an exact science and that Practitioner makes no warranties or guarantees regarding treatment outcomes. I acknowledge that a copy of this consent can be made available to me via my patient portal [facility] MyChart), or I can request a printed copy by calling the office of [facility].    I understand that my insurance will be billed for this visit.   I have read or had this consent read to me. . I understand the contents of this consent, which adequately explains the benefits and risks of the Services being provided via telemedicine.  . I have been provided ample opportunity to ask questions regarding this consent and the Services and have had my questions answered to my satisfaction. . I give my informed consent for the services to be provided through the use of telemedicine in my medical care

## 2020-07-19 ENCOUNTER — Other Ambulatory Visit: Payer: Self-pay | Admitting: Nurse Practitioner

## 2020-08-13 ENCOUNTER — Ambulatory Visit: Payer: Medicare HMO | Admitting: Cardiology

## 2020-10-25 ENCOUNTER — Other Ambulatory Visit: Payer: Self-pay | Admitting: Pulmonary Disease

## 2020-10-25 DIAGNOSIS — J449 Chronic obstructive pulmonary disease, unspecified: Secondary | ICD-10-CM

## 2020-11-02 ENCOUNTER — Other Ambulatory Visit: Payer: Self-pay

## 2020-11-02 ENCOUNTER — Other Ambulatory Visit (HOSPITAL_COMMUNITY): Payer: Medicare HMO

## 2020-11-02 ENCOUNTER — Inpatient Hospital Stay (HOSPITAL_COMMUNITY)
Admission: EM | Admit: 2020-11-02 | Discharge: 2020-11-03 | DRG: 291 | Disposition: A | Discharge status: Home / Self Care | Payer: Medicare HMO | Attending: Internal Medicine | Admitting: Internal Medicine

## 2020-11-02 ENCOUNTER — Inpatient Hospital Stay (HOSPITAL_COMMUNITY): Payer: Medicare HMO

## 2020-11-02 ENCOUNTER — Emergency Department (HOSPITAL_COMMUNITY): Payer: Medicare HMO

## 2020-11-02 DIAGNOSIS — I4510 Unspecified right bundle-branch block: Secondary | ICD-10-CM | POA: Diagnosis present

## 2020-11-02 DIAGNOSIS — Z9981 Dependence on supplemental oxygen: Secondary | ICD-10-CM

## 2020-11-02 DIAGNOSIS — Z5902 Unsheltered homelessness: Secondary | ICD-10-CM | POA: Diagnosis not present

## 2020-11-02 DIAGNOSIS — N182 Chronic kidney disease, stage 2 (mild): Secondary | ICD-10-CM | POA: Diagnosis present

## 2020-11-02 DIAGNOSIS — Z7951 Long term (current) use of inhaled steroids: Secondary | ICD-10-CM

## 2020-11-02 DIAGNOSIS — I255 Ischemic cardiomyopathy: Secondary | ICD-10-CM | POA: Diagnosis present

## 2020-11-02 DIAGNOSIS — Z7982 Long term (current) use of aspirin: Secondary | ICD-10-CM | POA: Diagnosis not present

## 2020-11-02 DIAGNOSIS — J441 Chronic obstructive pulmonary disease with (acute) exacerbation: Secondary | ICD-10-CM

## 2020-11-02 DIAGNOSIS — Z833 Family history of diabetes mellitus: Secondary | ICD-10-CM

## 2020-11-02 DIAGNOSIS — I10 Essential (primary) hypertension: Secondary | ICD-10-CM

## 2020-11-02 DIAGNOSIS — J9621 Acute and chronic respiratory failure with hypoxia: Secondary | ICD-10-CM | POA: Diagnosis present

## 2020-11-02 DIAGNOSIS — Z87442 Personal history of urinary calculi: Secondary | ICD-10-CM | POA: Diagnosis not present

## 2020-11-02 DIAGNOSIS — Z7902 Long term (current) use of antithrombotics/antiplatelets: Secondary | ICD-10-CM

## 2020-11-02 DIAGNOSIS — I509 Heart failure, unspecified: Secondary | ICD-10-CM

## 2020-11-02 DIAGNOSIS — Z820 Family history of epilepsy and other diseases of the nervous system: Secondary | ICD-10-CM

## 2020-11-02 DIAGNOSIS — Z885 Allergy status to narcotic agent status: Secondary | ICD-10-CM

## 2020-11-02 DIAGNOSIS — Z681 Body mass index (BMI) 19 or less, adult: Secondary | ICD-10-CM

## 2020-11-02 DIAGNOSIS — I252 Old myocardial infarction: Secondary | ICD-10-CM

## 2020-11-02 DIAGNOSIS — E785 Hyperlipidemia, unspecified: Secondary | ICD-10-CM

## 2020-11-02 DIAGNOSIS — J449 Chronic obstructive pulmonary disease, unspecified: Secondary | ICD-10-CM | POA: Diagnosis present

## 2020-11-02 DIAGNOSIS — Z20822 Contact with and (suspected) exposure to covid-19: Secondary | ICD-10-CM | POA: Diagnosis present

## 2020-11-02 DIAGNOSIS — Z9861 Coronary angioplasty status: Secondary | ICD-10-CM | POA: Diagnosis not present

## 2020-11-02 DIAGNOSIS — R64 Cachexia: Secondary | ICD-10-CM | POA: Diagnosis present

## 2020-11-02 DIAGNOSIS — R748 Abnormal levels of other serum enzymes: Secondary | ICD-10-CM | POA: Diagnosis present

## 2020-11-02 DIAGNOSIS — Z8249 Family history of ischemic heart disease and other diseases of the circulatory system: Secondary | ICD-10-CM

## 2020-11-02 DIAGNOSIS — Z79899 Other long term (current) drug therapy: Secondary | ICD-10-CM | POA: Diagnosis not present

## 2020-11-02 DIAGNOSIS — I739 Peripheral vascular disease, unspecified: Secondary | ICD-10-CM | POA: Diagnosis present

## 2020-11-02 DIAGNOSIS — I130 Hypertensive heart and chronic kidney disease with heart failure and stage 1 through stage 4 chronic kidney disease, or unspecified chronic kidney disease: Principal | ICD-10-CM | POA: Diagnosis present

## 2020-11-02 DIAGNOSIS — K219 Gastro-esophageal reflux disease without esophagitis: Secondary | ICD-10-CM | POA: Diagnosis not present

## 2020-11-02 DIAGNOSIS — R0609 Other forms of dyspnea: Secondary | ICD-10-CM | POA: Diagnosis not present

## 2020-11-02 DIAGNOSIS — Z9862 Peripheral vascular angioplasty status: Secondary | ICD-10-CM | POA: Diagnosis not present

## 2020-11-02 DIAGNOSIS — I2510 Atherosclerotic heart disease of native coronary artery without angina pectoris: Secondary | ICD-10-CM | POA: Diagnosis present

## 2020-11-02 DIAGNOSIS — R0600 Dyspnea, unspecified: Secondary | ICD-10-CM | POA: Diagnosis not present

## 2020-11-02 DIAGNOSIS — J9601 Acute respiratory failure with hypoxia: Secondary | ICD-10-CM | POA: Diagnosis not present

## 2020-11-02 DIAGNOSIS — J9600 Acute respiratory failure, unspecified whether with hypoxia or hypercapnia: Secondary | ICD-10-CM | POA: Diagnosis present

## 2020-11-02 DIAGNOSIS — E739 Lactose intolerance, unspecified: Secondary | ICD-10-CM | POA: Diagnosis present

## 2020-11-02 DIAGNOSIS — R52 Pain, unspecified: Secondary | ICD-10-CM

## 2020-11-02 DIAGNOSIS — I5023 Acute on chronic systolic (congestive) heart failure: Secondary | ICD-10-CM | POA: Diagnosis present

## 2020-11-02 DIAGNOSIS — Z888 Allergy status to other drugs, medicaments and biological substances status: Secondary | ICD-10-CM | POA: Diagnosis not present

## 2020-11-02 DIAGNOSIS — R252 Cramp and spasm: Secondary | ICD-10-CM | POA: Diagnosis present

## 2020-11-02 DIAGNOSIS — Z9114 Patient's other noncompliance with medication regimen: Secondary | ICD-10-CM

## 2020-11-02 DIAGNOSIS — J310 Chronic rhinitis: Secondary | ICD-10-CM

## 2020-11-02 LAB — COMPREHENSIVE METABOLIC PANEL
ALT: 24 U/L (ref 0–44)
AST: 42 U/L — ABNORMAL HIGH (ref 15–41)
Albumin: 3 g/dL — ABNORMAL LOW (ref 3.5–5.0)
Alkaline Phosphatase: 167 U/L — ABNORMAL HIGH (ref 38–126)
Anion gap: 7 (ref 5–15)
BUN: 24 mg/dL — ABNORMAL HIGH (ref 8–23)
CO2: 27 mmol/L (ref 22–32)
Calcium: 8.4 mg/dL — ABNORMAL LOW (ref 8.9–10.3)
Chloride: 106 mmol/L (ref 98–111)
Creatinine, Ser: 1.5 mg/dL — ABNORMAL HIGH (ref 0.61–1.24)
GFR, Estimated: 48 mL/min — ABNORMAL LOW (ref >60)
Glucose, Bld: 119 mg/dL — ABNORMAL HIGH (ref 70–99)
Potassium: 3.9 mmol/L (ref 3.5–5.1)
Sodium: 140 mmol/L (ref 135–145)
Total Bilirubin: 0.9 mg/dL (ref 0.3–1.2)
Total Protein: 6 g/dL — ABNORMAL LOW (ref 6.5–8.1)

## 2020-11-02 LAB — TROPONIN I (HIGH SENSITIVITY)
Troponin I (High Sensitivity): 44 ng/L — ABNORMAL HIGH (ref <18)
Troponin I (High Sensitivity): 40 ng/L — ABNORMAL HIGH (ref <18)

## 2020-11-02 LAB — CBC WITH DIFFERENTIAL/PLATELET
Abs Immature Granulocytes: 0.04 10*3/uL (ref 0.00–0.07)
Basophils Absolute: 0.1 10*3/uL (ref 0.0–0.1)
Basophils Relative: 1 %
Eosinophils Absolute: 0.2 10*3/uL (ref 0.0–0.5)
Eosinophils Relative: 3 %
HCT: 40.5 % (ref 39.0–52.0)
Hemoglobin: 12.7 g/dL — ABNORMAL LOW (ref 13.0–17.0)
Immature Granulocytes: 0 %
Lymphocytes Relative: 6 %
Lymphs Abs: 0.6 10*3/uL — ABNORMAL LOW (ref 0.7–4.0)
MCH: 30.2 pg (ref 26.0–34.0)
MCHC: 31.4 g/dL (ref 30.0–36.0)
MCV: 96.4 fL (ref 80.0–100.0)
Monocytes Absolute: 0.8 10*3/uL (ref 0.1–1.0)
Monocytes Relative: 8 %
Neutro Abs: 7.7 10*3/uL (ref 1.7–7.7)
Neutrophils Relative %: 82 %
Platelets: 204 10*3/uL (ref 150–400)
RBC: 4.2 MIL/uL — ABNORMAL LOW (ref 4.22–5.81)
RDW: 14.5 % (ref 11.5–15.5)
WBC: 9.4 10*3/uL (ref 4.0–10.5)
nRBC: 0 % (ref 0.0–0.2)

## 2020-11-02 LAB — BRAIN NATRIURETIC PEPTIDE: B Natriuretic Peptide: 2596.6 pg/mL — ABNORMAL HIGH (ref 0.0–100.0)

## 2020-11-02 LAB — RESP PANEL BY RT-PCR (FLU A&B, COVID) ARPGX2
Influenza A by PCR: NEGATIVE
Influenza B by PCR: NEGATIVE
SARS Coronavirus 2 by RT PCR: NEGATIVE

## 2020-11-02 MED ORDER — SODIUM CHLORIDE 0.9% FLUSH
3.0000 mL | Freq: Two times a day (BID) | INTRAVENOUS | Status: DC
  Administered 2020-11-02 – 2020-11-03 (×2): 3 mL via INTRAVENOUS

## 2020-11-02 MED ORDER — ONDANSETRON HCL 4 MG/2ML IJ SOLN: 4.0000 mg | Freq: Four times a day (QID) | INTRAMUSCULAR | Status: DC | PRN

## 2020-11-02 MED ORDER — POTASSIUM CHLORIDE CRYS ER 20 MEQ PO TBCR
30.0000 meq | EXTENDED_RELEASE_TABLET | Freq: Once | ORAL | Status: AC
  Administered 2020-11-02: 30 meq via ORAL
  Filled 2020-11-02: qty 2

## 2020-11-02 MED ORDER — ALBUTEROL SULFATE (2.5 MG/3ML) 0.083% IN NEBU: 2.5000 mg | INHALATION_SOLUTION | Freq: Four times a day (QID) | RESPIRATORY_TRACT | Status: DC | PRN

## 2020-11-02 MED ORDER — ATORVASTATIN CALCIUM 40 MG PO TABS
80.0000 mg | ORAL_TABLET | Freq: Every day | ORAL | Status: DC
  Administered 2020-11-02 – 2020-11-03 (×2): 80 mg via ORAL
  Filled 2020-11-02 (×2): qty 2

## 2020-11-02 MED ORDER — ALBUTEROL SULFATE HFA 108 (90 BASE) MCG/ACT IN AERS
4.0000 | INHALATION_SPRAY | Freq: Once | RESPIRATORY_TRACT | Status: DC
  Filled 2020-11-02: qty 6.7

## 2020-11-02 MED ORDER — ENOXAPARIN SODIUM 30 MG/0.3ML IJ SOSY
30.0000 mg | PREFILLED_SYRINGE | INTRAMUSCULAR | Status: DC
  Filled 2020-11-02: qty 0.3

## 2020-11-02 MED ORDER — FUROSEMIDE 10 MG/ML IJ SOLN
40.0000 mg | Freq: Once | INTRAMUSCULAR | Status: AC
  Administered 2020-11-02: 40 mg via INTRAVENOUS
  Filled 2020-11-02: qty 4

## 2020-11-02 MED ORDER — METHOCARBAMOL 500 MG PO TABS
500.0000 mg | ORAL_TABLET | Freq: Once | ORAL | Status: AC
  Administered 2020-11-02: 500 mg via ORAL
  Filled 2020-11-02: qty 1

## 2020-11-02 MED ORDER — CLOPIDOGREL BISULFATE 75 MG PO TABS
75.0000 mg | ORAL_TABLET | Freq: Every day | ORAL | Status: DC
  Administered 2020-11-02 – 2020-11-03 (×2): 75 mg via ORAL
  Filled 2020-11-02 (×2): qty 1

## 2020-11-02 MED ORDER — FUROSEMIDE 10 MG/ML IJ SOLN
40.0000 mg | Freq: Two times a day (BID) | INTRAMUSCULAR | Status: DC
  Administered 2020-11-02 – 2020-11-03 (×2): 40 mg via INTRAVENOUS
  Filled 2020-11-02 (×3): qty 4

## 2020-11-02 MED ORDER — NITROGLYCERIN 0.4 MG SL SUBL: 0.4000 mg | SUBLINGUAL_TABLET | SUBLINGUAL | Status: DC | PRN

## 2020-11-02 MED ORDER — NITROGLYCERIN 2 % TD OINT
1.0000 [in_us] | TOPICAL_OINTMENT | Freq: Once | TRANSDERMAL | Status: AC
  Administered 2020-11-02: 1 [in_us] via TOPICAL
  Filled 2020-11-02: qty 1

## 2020-11-02 MED ORDER — ASPIRIN 81 MG PO CHEW
81.0000 mg | CHEWABLE_TABLET | Freq: Every day | ORAL | Status: DC
  Administered 2020-11-02 – 2020-11-03 (×2): 81 mg via ORAL
  Filled 2020-11-02 (×2): qty 1

## 2020-11-02 MED ORDER — IPRATROPIUM-ALBUTEROL 0.5-2.5 (3) MG/3ML IN SOLN: 3.0000 mL | Freq: Four times a day (QID) | RESPIRATORY_TRACT | Status: DC | PRN

## 2020-11-02 MED ORDER — SODIUM CHLORIDE 0.9% FLUSH: 3.0000 mL | INTRAVENOUS | Status: DC | PRN

## 2020-11-02 MED ORDER — METHYLPREDNISOLONE SODIUM SUCC 125 MG IJ SOLR
125.0000 mg | Freq: Once | INTRAMUSCULAR | Status: AC
  Administered 2020-11-02: 125 mg via INTRAVENOUS
  Filled 2020-11-02: qty 2

## 2020-11-02 MED ORDER — METHOCARBAMOL 500 MG PO TABS
500.0000 mg | ORAL_TABLET | Freq: Four times a day (QID) | ORAL | Status: DC | PRN
  Administered 2020-11-02: 500 mg via ORAL
  Filled 2020-11-02: qty 1

## 2020-11-02 MED ORDER — PANTOPRAZOLE SODIUM 40 MG PO TBEC
40.0000 mg | DELAYED_RELEASE_TABLET | Freq: Every day | ORAL | Status: DC
  Administered 2020-11-02 – 2020-11-03 (×2): 40 mg via ORAL
  Filled 2020-11-02 (×2): qty 1

## 2020-11-02 MED ORDER — CARVEDILOL 3.125 MG PO TABS
3.1250 mg | ORAL_TABLET | Freq: Two times a day (BID) | ORAL | Status: DC
  Administered 2020-11-02 – 2020-11-03 (×3): 3.125 mg via ORAL
  Filled 2020-11-02 (×4): qty 1

## 2020-11-02 MED ORDER — SODIUM CHLORIDE 0.9 % IV SOLN: 250.0000 mL | INTRAVENOUS | Status: DC | PRN

## 2020-11-02 MED ORDER — ACETAMINOPHEN 325 MG PO TABS
650.0000 mg | ORAL_TABLET | ORAL | Status: DC | PRN
  Administered 2020-11-02: 650 mg via ORAL
  Filled 2020-11-02 (×2): qty 2

## 2020-11-02 NOTE — ED Triage Notes (Signed)
Pt EMS arrival from [facility], per EMS pt reports taking fluid pill x1 hr, denies missing doses of medication. Per EMS lungs sounds clear but diminished in all lobes, pt in respiratory distress upon arrival SPO2 70% RA with labored respirations CPAP applied improved 90% SPO2 with labored breathing. 18 L AC, .8 Nitro sublingual, wears 2L O2 at home.

## 2020-11-02 NOTE — H&P (Addendum)
Date: 11/02/2020               Patient Name:  Ray Smith MRN: [id number]  DOB: [birth year] Age / Sex: 75 y.o., male   PCP: Pcp, No         Medical Service: Internal Medicine Teaching Service         Attending Physician: Dr. Criselda Peaches    First Contact: Dr. Marijo Conception Pager: [phone]  Second Contact: Mcarthur Rossetti, MD, Leanna Sato Pager: SA [phone])       After Hours (After 5p/  First Contact Pager: [phone]  weekends / holidays): Second Contact Pager: [phone]   Chief Complaint: Shortness of breath  History of Present Illness: Ray Smith is a 75 year old gentleman with medical history significant for ischemic cardiomyopathy, heart failure with reduced ejection fraction EF 25-30% (2021), COPD, chronic respiratory failure on 5 L nasal cannula, CAD status post PCI/DES to left main in 2019, PVD s/p angioplasty in 2019, CKD stage II, hypertension, hyperlipidemia, tobacco use disorder, adjustment disorder presenting to the emergency department with shortness of breath  He states that he was in his usual state of health until about 2 days ago when he began experiencing worsening shortness of breath and orthopnea.  He states that prior to the onset of his symptoms he was not judicious in taking his medications.  At the onset, he called his pharmacy to try and get refills on his medicines however he was not able to do so. Prior to EMS arrival, he states that he took the left over medications he had. He does not endorse chest pain, headache, dizziness, abdominal pain, nausea, vomiting, syncope, palpitation, fevers, chills.   He also endorse a 2 week history of Lower extremity cramps located at his knees, calf and foot. States that it is quite debilitating and limits his ADLs.   ED course: On arrival to the ED he was found to be hypoxic with SPO2 of 70% on room air for which he was placed on BiPAP with improvement to his oxygenation.  CMP showed creatinine 1.5 from baseline of 1.3, BNP resulted at 2500.  He  additionally received topical nitrogen, Solu-Medrol and IV Lasix 40 mg with optimal UOP   Lab Orders     Resp Panel by RT-PCR (Flu A&B, Covid) Nasopharyngeal Swab     Comprehensive metabolic panel     CBC with Differential     Brain natriuretic peptide     Comprehensive metabolic panel     Magnesium   Meds:  No current facility-administered medications on file prior to encounter.   Current Outpatient Medications on File Prior to Encounter  Medication Sig  . albuterol (PROVENTIL) (5 MG/ML) 0.5% nebulizer solution Take 0.5 mLs (2.5 mg total) by nebulization every 6 (six) hours as needed for wheezing or shortness of breath.  Marland Kitchen albuterol (VENTOLIN HFA) 108 (90 Base) MCG/ACT inhaler Inhale 2 puffs into the lungs every 6 (six) hours as needed for wheezing or shortness of breath.  Marland Kitchen aspirin 81 MG EC tablet Take 1 tablet (81 mg total) by mouth daily.  Marland Kitchen atorvastatin (LIPITOR) 80 MG tablet Take 1 tablet (80 mg total) by mouth daily at 6 PM.  . carvedilol (COREG) 3.125 MG tablet Take 1 tablet (3.125 mg total) by mouth 2 (two) times daily with a meal.  . clopidogrel (PLAVIX) 75 MG tablet Take 1 tablet (75 mg total) by mouth daily.  . furosemide (LASIX) 40 MG tablet Take 1 tablet (40 mg total) by mouth every  other day. If u feel short of breath, you may take daily for 3 days then go back to every other day  . ipratropium (ATROVENT) 0.03 % nasal spray Place 2 sprays into both nostrils every 12 (twelve) hours.  . nitroGLYCERIN (NITROSTAT) 0.4 MG SL tablet Place 1 tablet (0.4 mg total) under the tongue every 5 (five) minutes x 3 doses as needed for chest pain.  . OXYGEN Inhale 3 L/min into the lungs as needed (for shortness of breath).   . pantoprazole (PROTONIX) 40 MG tablet TAKE 1 TABLET BY MOUTH EVERYDAY AT BEDTIME  . STIOLTO RESPIMAT 2.5-2.5 MCG/ACT AERS INHALE 2 PUFFS BY MOUTH INTO THE LUNGS DAILY     Allergies: Allergies as of 11/02/2020 - Review Complete 11/02/2020  Allergen Reaction Noted   . Tramadol Anaphylaxis 09/25/2017  . Flexeril [cyclobenzaprine] Other (See Comments) 10/23/2013  . Ibuprofen Other (See Comments) 03/13/2015  . Ibuprofen Nausea Only and Other (See Comments) 12/21/2019  . Lactose intolerance (gi) Other (See Comments) 10/06/2017   Past Medical History:  Diagnosis Date  . Acute ST elevation myocardial infarction (STEMI) involving left anterior descending (LAD) coronary artery (HCC) 12/30/2017  . Acute systolic heart failure (HCC) 12/30/2017  . COPD (chronic obstructive pulmonary disease) (HCC)   . Coronary artery disease    a.  presented with CP and inf STE but no ACS - LHC (10/23/13):  Dist LM 40-60%, ostial LAD 80%, mid LAD 50%, ostial CFX 50-70%, mid RCA 50%.  EF 60%. - CP not felt to be ischemic; CABG vs Med Rx d/w pt - pt opted for Med Rx  . History of kidney stones    "more than 20" (01/09/2018)  . Hyperlipidemia   . Hypertension   . Marijuana abuse   . MI (mitral incompetence)   . Tobacco dependence     Family History: Mother, Father with cardiac diseases  Social History: Has been living in his [name] for about a year. Has a dog named Showtime. Ocassionally drinks alcohol, denies cigarrette or tobacco use. 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 Gets about $800 from social security each month.   Review of Systems: A complete ROS was negative except as per HPI.   Physical Exam: Blood pressure (!) 137/95, pulse 78, temperature (!) 96.6 F (35.9 C), resp. rate (!) 29, height 5\' 10"  (1.778 m), weight 49.9 kg, SpO2 100 %. Physical Exam Constitutional:      General: He is not in acute distress.    Appearance: He is cachectic. He is not ill-appearing or toxic-appearing.  HENT:     Head: Normocephalic and atraumatic.  Eyes:     Conjunctiva/sclera: Conjunctivae normal.  Cardiovascular:     Rate and Rhythm: Normal rate.     Heart sounds: No murmur heard. No S3 sounds.      Comments: +JVP Pulmonary:     Effort: No  respiratory distress.     Breath sounds: Rales (Throughout all lung fields) present. No wheezing.  Abdominal:     General: Abdomen is flat. Bowel sounds are normal.     Tenderness: There is no abdominal tenderness.  Musculoskeletal:        General: Tenderness (On palpation of knee) present.     Cervical back: Neck supple.     Right lower leg: No edema.     Left lower leg: No edema.     Comments: -No calf tenderness on palpation  Skin:    General: Skin is warm.  Neurological:  Mental Status: He is alert.  Psychiatric:        Speech: Speech normal.        Cognition and Memory: Cognition normal.     EKG: personally reviewed my interpretation is SR, old RBBB  CXR: personally reviewed my interpretation is pulmonary vascular congestion  Assessment & Plan by Problem: Principal Problem:   Acute decompensated heart failure (HCC) Active Problems:   COPD (chronic obstructive pulmonary disease) (HCC)   Acute respiratory failure (HCC)   PVD (peripheral vascular disease) (HCC)  Mr. Velis is a 75 year old gentleman with medical history significant for ischemic cardiomyopathy, heart failure with reduced ejection fraction EF 25-30%, COPD, chronic respiratory failure on 5 L nasal cannula, CAD status post PCI/DES to left main in 2019, peripheral vascular disease status post angioplasty in 2019, hypertension, hyperlipidemia, tobacco use disorder, adjustment disorder here for management of acute hypoxic respiratory failure secondary to acute exacerbation of systolic heart failure   #Acute hypoxic respiratory failure 2/2 acute on chronic systolic heart failure His last echocardiogram showed an ejection fraction of 25-30%.  Etiology of his worsening heart failure seems to be medication non-adherence as he reported of not being consistent with his medications regimen. ACS has been ruled out with EKG and labs. He has no history of hyperthyroidism. There is no signs or symptoms of infection. Last  recorded weight in November 2021 was 128 pounds. -IV Lasix 40 mg BID -Strict intake and output -Daily weights -Continue cardiac monitoring -Monitor electrolytes closely including magnesium -Follow metabolic panel -Continue Coreg 3.125 mg BID -Add ACE/ARB/ARNI and SGLT2 inhibitor prior to discharge -TOC consulted for assistance    #Chronic respiratory failure 2/2 COPD on 5 L nasal cannula #Pulmonary cachexia  There were reports of possible confluent COPD exacerbation and he received IV Solu-Medrol.  His presentation seemed more likely due to heart failure given pulmonary vascular congestion on chest x-ray and no evidence of wheezing on my exam. -Supplemental oxygen as needed -BiPAP if acutely decompensates -DuoNeb, albuterol   #Ischemic cardiomyopathy #CAD status post PCI/DES Troponin 40<<44. No sign or symptoms of ACS -Continue aspirin, Plavix though he is out of the 1 year duration (noted to have a challenging CV modifying risk factor per cardiology)   #Incidental finding on CXR Chest x-ray showed possible small-moderate partially loculated pleural effusions as well as increased cardiac silhouette concerning for pericardial effusion.  In regards to his pleural effusions, I would like to diurese him today and repeat x-ray in the morning.  Obtain echocardiogram to evaluate for pericardial effusion   #Mild renal insufficiency Noted to have a diagnosis of CKD stage II.  Serum creatinine of 1.5 from a baseline of 1.34 - Continue to monitor metabolic panel   #PVD #LE cramps He is s/p Balloon angioplasty of the right superficial femoral artery using 5 x 100 mm balloon on 09/2017. DP pulses were difficult to palpate but well appreciated with doppler  -Follow up ABI   #Hypertension -Continue low-dose Coreg   #Hyperlipidemia - Continue Lipitor 80 mg daily   #GERD - Continue protonix    #Mildly elevated liver enzymes  Noted to have an AST level of 42.  Could represent early  cardiohepatic syndrome - Continue diuresis and follow-up CMP   #Subtance Use -Advised cessation   FEN: Low sodium diet VTE ppx: SQ Lovenox CODE STATUS: Full  Prior to Admission Living Arrangement: [name] Anticipated Discharge Location: TO be determined  Barriers to Discharge: Treatment of heart failure   Dispo: Admit patient to Inpatient  with expected length of stay greater than 2 midnights.  Signed: Yvette Rack, MD 11/02/2020, 11:17 AM  Pager: [phone] Internal Medicine Teaching Service After 5pm on weekdays and 1pm on weekends: On Call pager: [phone]

## 2020-11-02 NOTE — Progress Notes (Signed)
RT in to draw ordered ABG. Despite education pt continues to refuse ABG draw and is requesting Bipap be taken off. Dr. Rush Landmark notified and verbal order received to take pt off Bipap as tolerates. RT placed pt on 6L nasal cannula. MD at bedside. RN aware of changes. RT will continue to monitor and be available as needed.

## 2020-11-02 NOTE — Progress Notes (Signed)
PT Cancellation Note  Patient Details Name: Ray Smith MRN: [id number] DOB: Aug 10, 1945   Cancelled Treatment:    Reason Eval/Treat Not Completed: Other (comment) per chart, ankle imaging results pending, also per nursing notes patient did have recent fall out of his [name]. Will await results of imaging reports prior to starting PT eval.    Lerry Liner PT, DPT, PN1   Supplemental Physical Therapist [facility]    Pager [phone] Acute Rehab Office [phone]

## 2020-11-02 NOTE — ED Provider Notes (Signed)
7:27 AM Care assumed from Dr. Judd Lien.  At time of transfer of care, patient is awaiting for results of diagnostic laboratory testing and work-up prior to likely admission for worsening respiratory status with oxygen saturations in the 70s on his home oxygen.  Patient is now on BiPAP and previous team suspected combination of CHF exacerbation and COPD exacerbation.  Anticipate reassessment after work-up prior to admission.  9:18 AM Per nursing, patient is getting agitated with the BiPAP on.  He wants to take it off.  We agreed to let him de-escalate to nasal cannula with high rate of flow as well as get an ABG to reassess.  His labs have returned and his BNP is more elevated than before at over 2500.  Troponin is elevated at 44.  COVID and flu negative.  Increased creatinine from prior.  Chest x-ray showed abnormal chest with pulmonary edema and some loculated pleural effusions.  Slight increase in cardiac silhouette since August as well concern for possible pericardial effusion or hiatal hernia.  Reassessed and is starting to breathe better.  We will take him off the BiPAP and try him on the increased nasal cannula.  Given his combination of CHF exacerbation and likely COPD exacerbation in the setting of pollens and temperature changes as well as the possible pericardial effusion, will call for admission for further monitoring and management and diuresis.  Patient initially is refusing the Lasix because he reports he took some oral Lasix before coming to the emergency department.  We told him that during his mission he will likely need IV diuresis as well and he is agreeable to this.  Will call for admission for further management.   Clinical Impression: 1. COPD exacerbation (HCC)   2. Acute on chronic congestive heart failure, unspecified heart failure type (HCC)   3. Dyspnea     Disposition: Admit  This note was prepared with assistance of Dragon voice recognition software. Occasional wrong-word  or sound-a-like substitutions may have occurred due to the inherent limitations of voice recognition software.     Damel Querry, Canary Brim, MD 11/02/20 1148

## 2020-11-02 NOTE — Progress Notes (Signed)
Patient stated he fell out of [name] when he was getting out.

## 2020-11-02 NOTE — ED Provider Notes (Signed)
[facility] EMERGENCY DEPARTMENT Provider Note   CSN: [id number] Arrival date & time: 11/02/20  5364     History Chief Complaint  Patient presents with  . Respiratory Distress    Ray Smith is a 75 y.o. male.  Patient is a 75 year old male with extensive past medical history including coronary artery disease, COPD, congestive heart failure, hypertension on home oxygen at 5 L nasal cannula.  Patient brought by EMS for evaluation of shortness of breath.  He reports a several day history of worsening breathing, but no chest pain, fevers, chills.  He was found to be in respiratory distress by paramedics and placed on CPAP, then transported here.  The history is provided by the patient.       Past Medical History:  Diagnosis Date  . Acute ST elevation myocardial infarction (STEMI) involving left anterior descending (LAD) coronary artery (HCC) 12/30/2017  . Acute systolic heart failure (HCC) 12/30/2017  . COPD (chronic obstructive pulmonary disease) (HCC)   . Coronary artery disease    a.  presented with CP and inf STE but no ACS - LHC (10/23/13):  Dist LM 40-60%, ostial LAD 80%, mid LAD 50%, ostial CFX 50-70%, mid RCA 50%.  EF 60%. - CP not felt to be ischemic; CABG vs Med Rx d/w pt - pt opted for Med Rx  . History of kidney stones    "more than 20" (01/09/2018)  . Hyperlipidemia   . Hypertension   . Marijuana abuse   . MI (mitral incompetence)   . Tobacco dependence     Patient Active Problem List   Diagnosis Date Noted  . COPD with acute exacerbation (HCC) 09/03/2019  . Acute respiratory failure (HCC) 09/03/2019  . Acute exacerbation of CHF (congestive heart failure) (HCC) 09/02/2019  . Adjustment disorder with mixed disturbance of emotions and conduct   . Protein-calorie malnutrition, severe 01/10/2018  . Orthostatic hypotension 01/09/2018  . Weakness 01/09/2018  . Mouth pain 01/09/2018  . CKD (chronic kidney disease) stage 2, GFR 60-89 ml/min  01/09/2018  . Marijuana abuse 01/09/2018  . Acute systolic heart failure (HCC) 01/02/2018  . STEMI (ST elevation myocardial infarction) (HCC) 12/30/2017  . STEMI involving left anterior descending coronary artery (HCC) 12/30/2017  . Acute encephalopathy 10/06/2017  . AKI (acute kidney injury) (HCC)   . Altered mental status   . Hypertension 11/12/2013  . Dyslipidemia 10/24/2013  . ACS (acute coronary syndrome) (HCC) 10/23/2013  . Chest pain 10/23/2013  . Coronary Artery Disease 10/23/2013  . COPD (chronic obstructive pulmonary disease) (HCC) 10/23/2013  . Tobacco abuse 10/23/2013  . Precordial pain 10/23/2013    Past Surgical History:  Procedure Laterality Date  . CARDIAC CATHETERIZATION    . CORONARY/GRAFT ACUTE MI REVASCULARIZATION N/A 12/30/2017   Procedure: Coronary/Graft Acute MI Revascularization;  Surgeon: Tonny Bollman, MD;  Location: [facility] INVASIVE CV LAB;  Service: Cardiovascular;  Laterality: N/A;  Distal LM into LAD Xience [place] 3.5x35mm Ostial Cx Xience [place] 3.5x38mm  . LEFT HEART CATH AND CORONARY ANGIOGRAPHY N/A 12/30/2017   Procedure: LEFT HEART CATH AND CORONARY ANGIOGRAPHY;  Surgeon: Tonny Bollman, MD;  Location: [facility] INVASIVE CV LAB;  Service: Cardiovascular;  Laterality: N/A;  . LEFT HEART CATHETERIZATION WITH CORONARY ANGIOGRAM Bilateral 10/23/2013   Procedure: LEFT HEART CATHETERIZATION WITH CORONARY ANGIOGRAM;  Surgeon: Lesleigh Noe, MD;  Location: [facility] CATH LAB;  Service: Cardiovascular;  Laterality: Bilateral;  . snake bite surgery    . TONSILLECTOMY  1955  . TOTAL KNEE  ARTHROPLASTY  2001       Family History  Problem Relation Age of Onset  . Heart disease Mother   . Alzheimer's disease Father   . Multiple sclerosis Sister   . Diabetes Maternal Grandmother   . Drug abuse Other     Social History   Tobacco Use  . Smoking status: Former Games developer  . Smokeless tobacco: Never Used  . Tobacco comment: Last smoked 20 years ago  Substance Use Topics   . Alcohol use: Not Currently    Comment: Not in 21 days  . Drug use: Never    Types: Marijuana    Home Medications Prior to Admission medications   Medication Sig Start Date End Date Taking? Authorizing Provider  albuterol (PROVENTIL) (5 MG/ML) 0.5% nebulizer solution Take 0.5 mLs (2.5 mg total) by nebulization every 6 (six) hours as needed for wheezing or shortness of breath. 04/01/20   Martina Sinner, MD  albuterol (VENTOLIN HFA) 108 (90 Base) MCG/ACT inhaler Inhale 2 puffs into the lungs every 6 (six) hours as needed for wheezing or shortness of breath. 03/31/20   Martina Sinner, MD  aspirin 81 MG EC tablet Take 1 tablet (81 mg total) by mouth daily. 09/04/19   Rhetta Mura, MD  atorvastatin (LIPITOR) 80 MG tablet Take 1 tablet (80 mg total) by mouth daily at 6 PM. 04/28/20   Rosalio Macadamia, NP  carvedilol (COREG) 3.125 MG tablet Take 1 tablet (3.125 mg total) by mouth 2 (two) times daily with a meal. 04/28/20 05/28/20  Rosalio Macadamia, NP  clopidogrel (PLAVIX) 75 MG tablet Take 1 tablet (75 mg total) by mouth daily. 04/28/20   Rosalio Macadamia, NP  furosemide (LASIX) 40 MG tablet Take 1 tablet (40 mg total) by mouth every other day. If u feel short of breath, you may take daily for 3 days then go back to every other day 07/19/20   Jake Bathe, MD  ipratropium (ATROVENT) 0.03 % nasal spray Place 2 sprays into both nostrils every 12 (twelve) hours. 03/31/20   Martina Sinner, MD  nitroGLYCERIN (NITROSTAT) 0.4 MG SL tablet Place 1 tablet (0.4 mg total) under the tongue every 5 (five) minutes x 3 doses as needed for chest pain. 09/04/19   Rhetta Mura, MD  OXYGEN Inhale 3 L/min into the lungs as needed (for shortness of breath).     [provider]  pantoprazole (PROTONIX) 40 MG tablet TAKE 1 TABLET BY MOUTH EVERYDAY AT BEDTIME 04/28/20   Rosalio Macadamia, NP  STIOLTO RESPIMAT 2.5-2.5 MCG/ACT AERS INHALE 2 PUFFS BY MOUTH INTO THE LUNGS DAILY 10/25/20   Martina Sinner, MD    Allergies    Tramadol, Flexeril [cyclobenzaprine], Ibuprofen, Ibuprofen, and Lactose intolerance (gi)  Review of Systems   Review of Systems  All other systems reviewed and are negative.   Physical Exam Updated Vital Signs There were no vitals taken for this visit.  Physical Exam Vitals and nursing note reviewed.  Constitutional:      General: He is not in acute distress.    Appearance: He is well-developed. He is not diaphoretic.     Comments: Patient is an acutely on chronically ill-appearing male.  He is in moderate respiratory distress.  He is very thin/cachectic.  HENT:     Head: Normocephalic and atraumatic.  Cardiovascular:     Rate and Rhythm: Normal rate and regular rhythm.     Heart sounds: No murmur heard. No friction rub.  Pulmonary:     Effort: Pulmonary effort is normal. No respiratory distress.     Breath sounds: Rhonchi present. No wheezing or rales.  Abdominal:     General: Bowel sounds are normal. There is no distension.     Palpations: Abdomen is soft.     Tenderness: There is no abdominal tenderness.  Musculoskeletal:        General: Normal range of motion.     Cervical back: Normal range of motion and neck supple.  Skin:    General: Skin is warm and dry.  Neurological:     Mental Status: He is alert and oriented to person, place, and time.     Coordination: Coordination normal.     ED Results / Procedures / Treatments   Labs (all labs ordered are listed, but only abnormal results are displayed) Labs Reviewed  RESP PANEL BY RT-PCR (FLU A&B, COVID) ARPGX2  COMPREHENSIVE METABOLIC PANEL  CBC WITH DIFFERENTIAL/PLATELET  BRAIN NATRIURETIC PEPTIDE  BLOOD GAS, ARTERIAL  TROPONIN I (HIGH SENSITIVITY)    EKG EKG Interpretation  Date/Time:  Tuesday Nov 02 2020 06:19:19 EDT Ventricular Rate:  84 PR Interval:  229 QRS Duration: 149 QT Interval:  419 QTC Calculation: 496 R Axis:   261 Text Interpretation: Sinus rhythm Atrial  premature complexes Prolonged PR interval Left atrial enlargement Right bundle branch block Inferior infarct, old Anterolateral infarct, age indeterminate Confirmed by Geoffery Lyons ([id number]) on 11/02/2020 6:21:32 AM   Radiology No results found.  Procedures Procedures   Medications Ordered in ED Medications  methylPREDNISolone sodium succinate (SOLU-MEDROL) 125 mg/2 mL injection 125 mg (has no administration in time range)  albuterol (VENTOLIN HFA) 108 (90 Base) MCG/ACT inhaler 4 puff (has no administration in time range)    ED Course  I have reviewed the triage vital signs and the nursing notes.  Pertinent labs & imaging results that were available during my care of the patient were reviewed by me and considered in my medical decision making (see chart for details).    MDM Rules/Calculators/A&P  Patient brought from the hotel where he resides for evaluation of shortness of breath.  He has extensive past medical history including CHF, COPD, coronary artery disease, hypertension, and is on home oxygen around-the-clock at 5 L by nasal cannula.  The source of his dyspnea this evening appears to be multifactorial.  I suspect component of both COPD and CHF.  Patient is hypertensive with chest x-ray showing a constellation of findings most consistent with pulmonary edema.  He has received IV Lasix and nitroglycerin paste.  I have also given Solu-Medrol and albuterol.  Patient awaiting results of laboratory studies.  Care signed out to Dr. Rush Landmark at shift change.  He will obtain the results of the laboratory studies and determine the final disposition.  CRITICAL CARE Performed by: Geoffery Lyons Total critical care time: 35 minutes Critical care time was exclusive of separately billable procedures and treating other patients. Critical care was necessary to treat or prevent imminent or life-threatening deterioration. Critical care was time spent personally by me on the following activities:  development of treatment plan with patient and/or surrogate as well as nursing, discussions with consultants, evaluation of patient's response to treatment, examination of patient, obtaining history from patient or surrogate, ordering and performing treatments and interventions, ordering and review of laboratory studies, ordering and review of radiographic studies, pulse oximetry and re-evaluation of patient's condition.   Final Clinical Impression(s) / ED Diagnoses Final diagnoses:  None  Rx / DC Orders ED Discharge Orders    None       Geoffery Lyons, MD 11/03/20 [phone]

## 2020-11-03 ENCOUNTER — Inpatient Hospital Stay (HOSPITAL_COMMUNITY): Payer: Medicare HMO

## 2020-11-03 ENCOUNTER — Encounter (HOSPITAL_COMMUNITY): Payer: Self-pay | Admitting: Internal Medicine

## 2020-11-03 DIAGNOSIS — R0609 Other forms of dyspnea: Secondary | ICD-10-CM

## 2020-11-03 LAB — ECHOCARDIOGRAM COMPLETE
AR max vel: 1.08 cm2
AV Area VTI: 1.07 cm2
AV Area mean vel: 1.11 cm2
AV Mean grad: 7 mmHg
AV Peak grad: 16.2 mmHg
Ao pk vel: 2.01 m/s
Area-P 1/2: 4.68 cm2
Height: 70 in
S' Lateral: 4.6 cm
Weight: 1756.63 oz

## 2020-11-03 LAB — COMPREHENSIVE METABOLIC PANEL WITH GFR
ALT: 18 U/L (ref 0–44)
AST: 20 U/L (ref 15–41)
Albumin: 2.9 g/dL — ABNORMAL LOW (ref 3.5–5.0)
Alkaline Phosphatase: 133 U/L — ABNORMAL HIGH (ref 38–126)
Anion gap: 9 (ref 5–15)
BUN: 35 mg/dL — ABNORMAL HIGH (ref 8–23)
CO2: 27 mmol/L (ref 22–32)
Calcium: 8.6 mg/dL — ABNORMAL LOW (ref 8.9–10.3)
Chloride: 103 mmol/L (ref 98–111)
Creatinine, Ser: 1.6 mg/dL — ABNORMAL HIGH (ref 0.61–1.24)
GFR, Estimated: 44 mL/min — ABNORMAL LOW
Glucose, Bld: 106 mg/dL — ABNORMAL HIGH (ref 70–99)
Potassium: 4 mmol/L (ref 3.5–5.1)
Sodium: 139 mmol/L (ref 135–145)
Total Bilirubin: 1 mg/dL (ref 0.3–1.2)
Total Protein: 5.3 g/dL — ABNORMAL LOW (ref 6.5–8.1)

## 2020-11-03 LAB — MAGNESIUM: Magnesium: 2 mg/dL (ref 1.7–2.4)

## 2020-11-03 MED ORDER — ATORVASTATIN CALCIUM 80 MG PO TABS: 80.0000 mg | ORAL_TABLET | Freq: Every day | ORAL | 2 refills | Status: DC

## 2020-11-03 MED ORDER — PERFLUTREN LIPID MICROSPHERE
1.0000 mL | INTRAVENOUS | Status: AC | PRN
Start: 2020-11-03 — End: 2020-11-03
  Administered 2020-11-03: 5 mL via INTRAVENOUS
  Filled 2020-11-03: qty 10

## 2020-11-03 MED ORDER — ALBUTEROL SULFATE HFA 108 (90 BASE) MCG/ACT IN AERS: 2.0000 | INHALATION_SPRAY | Freq: Four times a day (QID) | RESPIRATORY_TRACT | 6 refills | Status: DC | PRN

## 2020-11-03 MED ORDER — ALBUTEROL SULFATE (5 MG/ML) 0.5% IN NEBU: 2.5000 mg | INHALATION_SOLUTION | Freq: Four times a day (QID) | RESPIRATORY_TRACT | 5 refills | Status: DC | PRN

## 2020-11-03 MED ORDER — STIOLTO RESPIMAT 2.5-2.5 MCG/ACT IN AERS: INHALATION_SPRAY | RESPIRATORY_TRACT | 6 refills | Status: DC

## 2020-11-03 MED ORDER — NITROGLYCERIN 0.4 MG SL SUBL: 0.4000 mg | SUBLINGUAL_TABLET | SUBLINGUAL | 2 refills | Status: DC | PRN

## 2020-11-03 MED ORDER — IPRATROPIUM BROMIDE 0.03 % NA SOLN: 2.0000 | Freq: Two times a day (BID) | NASAL | 12 refills | Status: DC

## 2020-11-03 MED ORDER — CARVEDILOL 3.125 MG PO TABS: 3.1250 mg | ORAL_TABLET | Freq: Two times a day (BID) | ORAL | 2 refills | Status: DC

## 2020-11-03 MED ORDER — PNEUMOCOCCAL VAC POLYVALENT 25 MCG/0.5ML IJ INJ: 0.5000 mL | INJECTION | INTRAMUSCULAR | Status: DC

## 2020-11-03 MED ORDER — ASPIRIN 81 MG PO TBEC: 81.0000 mg | DELAYED_RELEASE_TABLET | Freq: Every day | ORAL | 2 refills | Status: DC

## 2020-11-03 MED ORDER — CLOPIDOGREL BISULFATE 75 MG PO TABS: 75.0000 mg | ORAL_TABLET | Freq: Every day | ORAL | 0 refills | Status: DC

## 2020-11-03 MED ORDER — FUROSEMIDE 40 MG PO TABS: 40.0000 mg | ORAL_TABLET | Freq: Every day | ORAL | Status: DC

## 2020-11-03 MED ORDER — PANTOPRAZOLE SODIUM 40 MG PO TBEC: DELAYED_RELEASE_TABLET | ORAL | 2 refills | Status: DC

## 2020-11-03 MED ORDER — FUROSEMIDE 40 MG PO TABS: 40.0000 mg | ORAL_TABLET | ORAL | 2 refills | Status: DC

## 2020-11-03 NOTE — Progress Notes (Incomplete)
  Echocardiogram 2D Echocardiogram has been performed.  Shirlean Kelly 11/03/2020, 12:20 PM

## 2020-11-03 NOTE — Progress Notes (Signed)
  Date: 11/03/2020  Patient name: KHRISTOPHER KAPAUN  Medical record number: [id number]  Date of birth: 1946-01-07   I have seen and evaluated [name] and discussed their care with the Residency Team. Briefly, Mr. Thune is a 75 year old man with PMH of ischemic CM with HFrEF (EF 25-30%), COPD, chronic respiratory failure, CAD, PVD, CKD 2, HTN, HLD, TUD who presented for volume overload, SOB and hypoxia in the setting of missing his medications for 2 days.  He states he was having trouble getting them filled at the pharmacy which is why he missed them.  He has access to his oxygen and he intends to take his medications.  After receiving lasix and being diuresed overnight, he reports feeling "the best he has in months."  His lung sounds much better and he is resting comfortably off of oxygen.  He has no peripheral edema.   Vitals:   11/03/20 0326 11/03/20 0737  BP: 123/80 (!) 120/94  Pulse: 70 65  Resp: 18 20  Temp: 97.6 F (36.4 C) (!) 97.4 F (36.3 C)  SpO2: 100% 92%   Gen: Thin gentleman, no acute distress Eyes: Anicteric sclerae HENT: Neck supple, MMM CV: RR, NR, no murmur, no peripheral edema, cool extremities noted, he has minimal JVD Pulm: CTAB, crackles at left base with clearing after cough, no wheezing Abd: Scaphoid, soft, +BS MSK: Thin bulk, normal tone Psych: Pleasant, normal mood, requesting to return home.   CXR done today is pending read, pulmonary edema improved, pleural effusions remain, ? Chronic TTE pending read, but done.   Assessment and Plan: I have seen and evaluated the patient as outlined above. I agree with the formulated Assessment and Plan as detailed in the residents' note, with the following changes:   1. Acute on chronic HFrEF, acute hypoxic respiratory failure - IV lasix yesterday with good urine output - Renal function slightly worsened today - Possibly flash pulmonary edema which improved with BIPAP briefly and lasix - Transition to oral medications  today - Refills sent to new pharmacy - TTE for completion - Continue home oxygen of 5LNC - May be able to discharge today if he receives his medications.   Await reading on CXR and TTE.  If he continues to do well, further work up can be obtained outpatient.   Inez Catalina, MD 5/11/20221:27 PM

## 2020-11-03 NOTE — TOC Initial Note (Signed)
Transition of Care [facility]) - Initial/Assessment Note    Patient Details  Name: Ray Smith MRN: [id number] Date of Birth: 17-Feb-1946  Transition of Care [facility]) CM/SW Contact:    Reola Mosher Transition of Care Supervisor Phone Number: [phone] 11/03/2020, 4:22 PM  Clinical Narrative:                 Talked to patient at the bedside. Patient was living in his [name] with his dog prior to adm but states now that his friend Mertha Finders - cell # [phone]) helped him get a room at a hotel / [employer]. No PCP, follow up apt made with [facility] for December 14, 2020 with Dr Laural Benes for 2:30 pm; has private insurance with [employer] with prescription drug coverage; pharmacy of choice is [facility] on [address] Nebulizer ordered for home, to be delivered to his room prior to discharging home. Patient is estranged from his family; has very supportive friend Madelin Rear.  Independent of his ADL, no HHC needed at this time. TOC will continue to follow for progression of care.  Expected Discharge Plan: Home/Self Care Barriers to Discharge: No Barriers Identified   Patient Goals and CMS Choice Patient states their goals for this hospitalization and ongoing recovery are:: going back home to see his dog   Choice offered to / list presented to : NA  Expected Discharge Plan and Services Expected Discharge Plan: Home/Self Care   Discharge Planning Services: CM Consult   Living arrangements for the past 2 months: Hotel/Motel                 DME Arranged: Nebulizer machine DME Agency: [employer] Date DME Agency Contacted: 11/03/20 Time DME Agency Contacted: [phone]   HH Arranged: NA          Prior Living Arrangements/Services Living arrangements for the past 2 months: Hotel/Motel Lives with:: Self Patient language and need for interpreter reviewed:: No Do you feel safe going back to the place where you live?: Yes      Need for Family  Participation in Patient Care: No (Comment) Care giver support system in place?: No (comment)   Criminal Activity/Legal Involvement Pertinent to Current Situation/Hospitalization: No - Comment as needed  Activities of Daily Living Home Assistive Devices/Equipment: Oxygen,Nebulizer ADL Screening (condition at time of admission) Patient's cognitive ability adequate to safely complete daily activities?: No Is the patient deaf or have difficulty hearing?: Yes Does the patient have difficulty seeing, even when wearing glasses/contacts?: No Does the patient have difficulty concentrating, remembering, or making decisions?: No Patient able to express need for assistance with ADLs?: No Does the patient have difficulty dressing or bathing?: No Independently performs ADLs?: No Communication: Independent Dressing (OT): Independent Grooming: Independent Feeding: Independent Bathing: Independent Toileting: Independent In/Out Bed: Independent Walks in Home: Independent Does the patient have difficulty walking or climbing stairs?: No (unles fluid increases) Weakness of Legs: None Weakness of Arms/Hands: None  Permission Sought/Granted Permission sought to share information with : Case Manager Permission granted to share information with : Yes, Verbal Permission Granted  Share Information with NAME: [employer] ? Zack  Permission granted to share info w AGENCY: [employer]  Permission granted to share info w Relationship: Dillion Cardille - friend     Emotional Assessment   Attitude/Demeanor/Rapport: Gracious Affect (typically observed): Accepting Orientation: : Oriented to Self,Oriented to Place,Oriented to  Time,Oriented to Situation   Psych Involvement: No (comment)  Admission diagnosis:  Dyspnea [R06.00] COPD  exacerbation (HCC) [J44.1] Acute decompensated heart failure (HCC) [I50.9] Acute on chronic congestive heart failure, unspecified heart failure type [place]) [I50.9] Patient Active Problem  List   Diagnosis Date Noted  . Acute decompensated heart failure (HCC) 11/02/2020  . PVD (peripheral vascular disease) (HCC) 11/02/2020  . COPD with acute exacerbation (HCC) 09/03/2019  . Acute respiratory failure (HCC) 09/03/2019  . Acute exacerbation of CHF (congestive heart failure) (HCC) 09/02/2019  . Adjustment disorder with mixed disturbance of emotions and conduct   . Protein-calorie malnutrition, severe 01/10/2018  . Orthostatic hypotension 01/09/2018  . Weakness 01/09/2018  . Mouth pain 01/09/2018  . CKD (chronic kidney disease) stage 2, GFR 60-89 ml/min 01/09/2018  . Marijuana abuse 01/09/2018  . Acute systolic heart failure (HCC) 01/02/2018  . STEMI (ST elevation myocardial infarction) (HCC) 12/30/2017  . STEMI involving left anterior descending coronary artery (HCC) 12/30/2017  . Acute encephalopathy 10/06/2017  . AKI (acute kidney injury) (HCC)   . Altered mental status   . Hypertension 11/12/2013  . Dyslipidemia 10/24/2013  . ACS (acute coronary syndrome) (HCC) 10/23/2013  . Chest pain 10/23/2013  . Coronary Artery Disease 10/23/2013  . COPD (chronic obstructive pulmonary disease) (HCC) 10/23/2013  . Tobacco abuse 10/23/2013  . Precordial pain 10/23/2013   PCP:  Pcp, No Pharmacy:   [facility] [phone] Gin[place] Otto, [place] - [address] AT [facility] [address] [place] [address] Phone: [phone] Fax: [phone]     Social Determinants of Health (SDOH) Interventions    Readmission Risk Interventions No flowsheet data found.
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
## 2020-11-03 NOTE — Progress Notes (Signed)
Patient would like to talk MD team about Discharge waiting on Test results.

## 2020-11-03 NOTE — Evaluation (Signed)
Physical Therapy Evaluation Patient Details Name: Ray Smith MRN: [id number] DOB: 02-17-1946 Today's Date: 11/03/2020   History of Present Illness  75 y.o. male presents to [facility] ED on 11/02/2020 with SOB. Pt found to be hypoxic in ED, placed on BiPAP. Pt admitted for CHF and respiratory failure management. PMH includes ischemic cardiomyopathy, heart failure with reduced ejection fraction EF 25-30% (2021), COPD, chronic respiratory failure on 5 L [place], CAD status post PCI/DES, PVD s/p angioplasty in 2019, CKD, HTN, HLD, adjustment disorder.  Clinical Impression  Pt presents at baseline, mobilizing independently and denying SOB or increased work of breathing. Pt denies concerns about mobility currently, only reporting intermittent LE cramping as a concern at this time, but this resolves with standing or mobility and is only present when laying flat. Pt has no current acute PT needs at this time. Acute PT signing off.    Follow Up Recommendations No PT follow up    Equipment Recommendations  None recommended by PT    Recommendations for Other Services       Precautions / Restrictions Precautions Precautions: Other (comment) (monitor SpO2) Precaution Comments: pt reports wearing 2-3L art night Restrictions Weight Bearing Restrictions: No      Mobility  Bed Mobility Overal bed mobility: Independent                  Transfers Overall transfer level: Independent Equipment used: None                Ambulation/Gait Ambulation/Gait assistance: Independent Gait Distance (Feet): 350 Feet Assistive device: None Gait Pattern/deviations: WFL(Within Functional Limits) Gait velocity: functional Gait velocity interpretation: >2.62 ft/sec, indicative of community ambulatory General Gait Details: steady step through gait  Stairs            Wheelchair Mobility    Modified Rankin (Stroke Patients Only)       Balance Overall balance assessment: Independent                                            Pertinent Vitals/Pain Pain Assessment: No/denies pain    Home Living Family/patient expects to be discharged to:: Other (Comment) (motel)                 Additional Comments: assistance from neighbors PRN    Prior Function Level of Independence: Independent               Hand Dominance        Extremity/Trunk Assessment   Upper Extremity Assessment Upper Extremity Assessment: Overall WFL for tasks assessed    Lower Extremity Assessment Lower Extremity Assessment: Overall WFL for tasks assessed    Cervical / Trunk Assessment Cervical / Trunk Assessment: Normal  Communication   Communication: HOH  Cognition Arousal/Alertness: Awake/alert Behavior During Therapy: WFL for tasks assessed/performed Overall Cognitive Status: Within Functional Limits for tasks assessed                                        General Comments General comments (skin integrity, edema, etc.): pt on RA upon arrival, denies SOB with activity, sats at 100% upon completion of ambulation    Exercises     Assessment/Plan    PT Assessment Patent does not need any further PT services  PT Problem  List         PT Treatment Interventions      PT Goals (Current goals can be found in the Care Plan section)       Frequency     Barriers to discharge        Co-evaluation               AM-PAC PT "6 Clicks" Mobility  Outcome Measure Help needed turning from your back to your side while in a flat bed without using bedrails?: None Help needed moving from lying on your back to sitting on the side of a flat bed without using bedrails?: None Help needed moving to and from a bed to a chair (including a wheelchair)?: None Help needed standing up from a chair using your arms (e.g., wheelchair or bedside chair)?: None Help needed to walk in hospital room?: None Help needed climbing 3-5 steps with a railing? : None 6  Click Score: 24    End of Session   Activity Tolerance: Patient tolerated treatment well Patient left: in bed;with call bell/phone within reach Nurse Communication: Mobility status      Time: 1053-1105 PT Time Calculation (min) (ACUTE ONLY): 12 min   Charges:   PT Evaluation $PT Eval Low Complexity: 1 Low          Arlyss Gandy, PT, DPT Acute Rehabilitation Pager: [phone]   Arlyss Gandy 11/03/2020, 11:33 AM

## 2020-11-03 NOTE — Discharge Summary (Signed)
Name: Ray Smith MRN: [id number] DOB: July 11, 1945 75 y.o. PCP: Pcp, No  Date of Admission: 11/02/2020  6:10 AM Date of Discharge: 11/03/2020 Attending Physician: Inez Catalina, MD  Discharge Diagnosis: 1. Acute on chronic hypoxic respiratory failure 2/2 acute on chronic systolic heart failure 2. COPD on chronic 5L supplemental oxygen 3. PVD s/p angioplasty 4. CAD s/p PCI/DES 5. CKDII 6. Hypertension   Discharge Medications: Allergies as of 11/03/2020      Reactions   Tramadol Anaphylaxis   Flexeril [cyclobenzaprine] Other (See Comments)   Per patient "it made my heart stop"   Ibuprofen Other (See Comments)   Overuse damages pt's kidneys   Ibuprofen Nausea Only, Other (See Comments)   Reaction not recalled- perhaps made his stomach hurt   Lactose Intolerance (gi) Other (See Comments)   Digestive issues      Medication List    TAKE these medications   albuterol (5 MG/ML) 0.5% nebulizer solution Commonly known as: PROVENTIL Take 0.5 mLs (2.5 mg total) by nebulization every 6 (six) hours as needed for wheezing or shortness of breath.   albuterol 108 (90 Base) MCG/ACT inhaler Commonly known as: VENTOLIN HFA Inhale 2 puffs into the lungs every 6 (six) hours as needed for wheezing or shortness of breath.   aspirin 81 MG EC tablet Take 1 tablet (81 mg total) by mouth daily.   atorvastatin 80 MG tablet Commonly known as: LIPITOR Take 1 tablet (80 mg total) by mouth daily at 6 PM.   carvedilol 3.125 MG tablet Commonly known as: Coreg Take 1 tablet (3.125 mg total) by mouth 2 (two) times daily with a meal.   clopidogrel 75 MG tablet Commonly known as: PLAVIX Take 1 tablet (75 mg total) by mouth daily.   furosemide 40 MG tablet Commonly known as: LASIX Take 1 tablet (40 mg total) by mouth every other day. If u feel short of breath, you may take daily for 3 days then go back to every other day   ipratropium 0.03 % nasal spray Commonly known as: ATROVENT Place 2  sprays into both nostrils every 12 (twelve) hours.   nitroGLYCERIN 0.4 MG SL tablet Commonly known as: NITROSTAT Place 1 tablet (0.4 mg total) under the tongue every 5 (five) minutes x 3 doses as needed for chest pain.   OXYGEN Inhale 5 L/min into the lungs at bedtime as needed (for shortness of breath).   pantoprazole 40 MG tablet Commonly known as: PROTONIX TAKE 1 TABLET BY MOUTH EVERYDAY AT BEDTIME What changed:   how much to take  how to take this  when to take this  additional instructions   Stiolto Respimat 2.5-2.5 MCG/ACT Aers Generic drug: Tiotropium Bromide-Olodaterol INHALE 2 PUFFS BY MOUTH INTO THE LUNGS DAILY What changed: See the new instructions.      Disposition and follow-up:   Ray Smith was discharged from [facility] in Stable condition.  At the hospital follow up visit please address:   Acute on chronic hypoxic respiratory failure 2/2 acute on chronic systolic heart failure: Originally presented d/t running out of medications. Will need to ensure he receives his medications.   PVD s/p angioplasty: Patient continues to have bilateral leg cramping. On DAPT now, can consider further ABI's as outpatient.   CKDII: Will need f/u BMP to ensure renal function back to baseline. Can consider ACE/ARB.  2.  Labs / imaging needed at time of follow-up: BMP, CBC, ABI's  3.  Pending labs/ test needing  follow-up: n/a  Follow-up Appointments:    Follow-up Information    Jake Bathe, MD. Schedule an appointment as soon as possible for a visit in 1 week.   Specialty: Cardiology Contact information: [address] [place] [address] [phone]        [facility] On 12/14/2020.   Why: at 2:30pm with Dr Laural Benes. Please try to keep your apt or call to reschedule Contact information: [address] [place] [address] [phone]             Hospital  Course by problem list: 1. Acute on chronic hypoxic respiratory failure 2/2 acute on chronic systolic heart failure: Patient with hx HFrEF arrived to [facility] on 5/11 with 2 day history of dyspnea and orthopnea, reportedly ran out of medications for a few days prior. On arrival he was hypoxic, SpO2 70% on room air, and subsequently placed on BiPAP. In ED he received IV lasix 40mg  and solumedrol for possible COPD exacerbation. CXR showed cardiomegaly with pulmonary congestion and bilateral pleural effusions. He was then admitted to IMTS for admission. Overnight, patient diuresed well and was able to wean off supplemental oxygen. Repeat chest x-ray in the morning revealed resolution of pulmonary congestion although pleural effusions remained. TTE obtained revealed no change in EF or regional wall motion abnormalities. Given his marked improvement patient was hemodynamically stable for discharge. Patient was provided prescriptions for his home medications and given an appointment with a PCP. He was also instructed to follow-up with his cardiologist.   2. COPD on chronic 5L supplemental oxygen: Patient originally received IV solumedrol in ED for possible COPD exacerbation. However, presentation more consistent with acute heart failure exacerbation and he subsequently improved with diuresis. Patient was eventually able to be weaned off oxygen and doing well on room air.   3. PVD s/p angioplasty: Patient previously underwent angioplasty due to peripheral vascular disease. On exam patient was discussing claudication symptoms. Patient was hemodynamically stable and preferred discharge and follow-up ABI's in outpatient setting.  4. CAD s/p PCI/DES: Patient had mildly elevated troponin 44 on arrival, down to 40. ECG without ischemic changes. Patient denying chest pain, little concern for ACS for this presentation. Patient to continue dual antiplatelet therapy upon discharge.  5. CKDII: On arrival patient's baseline noted  to be 1.3-1.4. sCr noted to be 1.5. After getting two doses of IV lasix 40mg , sCr mildly increased to 1.6. Patient will follow-up with PCP to make sure renal function returns to baseline."  6. Hypertension: Patient remained normotensive throughout hospital stay. Patient to resume low-dose carvedilol on discharge. Can consider ACE/ARB as outpatient.  Discharge Exam:   BP (!) 120/94 (BP Location: Left Arm)   Pulse 65   Temp (!) 97.4 F (36.3 C) (Oral)   Resp 20   Ht 5\' 10"  (1.778 m)   Wt 49.8 kg   SpO2 92%   BMI 15.75 kg/m  General: Laying in bed, no acute distress CV: Regular rate, rhythm. No murmurs, rubs, gallops appreciated. No JVD Pulm: Normal work of breathing, clear to auscultation bilaterally. MSK: Cachectic. No pitting edema bilaterally. Neuro: Awake, alert, answering questions appropriately. Psych: Normal mood, affect, speech.  Pertinent Labs, Studies, and Procedures:  CBC Latest Ref Rng & Units 11/02/2020 01/26/2020 01/04/2020  WBC 4.0 - 10.5 K/uL 9.4 10.2 12.2(H)  Hemoglobin 13.0 - 17.0 g/dL 12.7(L) 12.5(L) 14.9  Hematocrit 39.0 - 52.0 % 40.5 41.2 47.3  Platelets 150 - 400 K/uL  204 291 209   BMP Latest Ref Rng & Units 11/03/2020 11/02/2020 01/26/2020  Glucose 70 - 99 mg/dL 761(P) 509(T) 267(T)  BUN 8 - 23 mg/dL 24(P) 80(D) 18  Creatinine 0.61 - 1.24 mg/dL 9.83(J) 8.25(K) 5.39(J)  Sodium 135 - 145 mmol/L 139 140 143  Potassium 3.5 - 5.1 mmol/L 4.0 3.9 3.6  Chloride 98 - 111 mmol/L 103 106 99  CO2 22 - 32 mmol/L 27 27 32  Calcium 8.9 - 10.3 mg/dL 6.7(H) 4.1(P) 9.0   BNP 2600 Troponin 44>40  CXR 5/10: Abnormal chest with constellation most suggestive of pulmonary edema and small to moderate partially loculated pleural effusions. Increased cardiac silhouette since August, consider pericardial effusion. New hiatal hernia also difficult to exclude.  TTE 5/12: LVEF 25-30%. Global hypokinesis. Grade II diastolic dysfunction. Elevated left atrial pressure. Moderately dilated LA.  RA mildly dilated. Mild to moderate aortic valve stenosis.  Discharge Instructions:  Ray Smith, I am so glad you are feeling better and are able to be discharged! You were admitted because of a heart failure exacerbation. Thankfully, we were able to get fluid off of with IV medication. We would like for you to follow-up with a primary care physician within the next week as well as your cardiologist. We have sent in your regular medications to [facility].   It was a pleasure meeting you, Ray Smith. I wish you the best and hope you stay happy and healthy!  Thank you, Evlyn Kanner, MD  Signed: Evlyn Kanner, MD 11/03/2020, 1:22 PM   Pager: [phone]

## 2020-11-04 ENCOUNTER — Telehealth (HOSPITAL_COMMUNITY): Payer: Self-pay

## 2020-11-04 NOTE — Telephone Encounter (Signed)
Heart Failure Nurse Navigator Progress Note  Attempted to call patient at primary number listed, no answer, no callback number listed. Called alternate contact person, Dylan- left HIPPA appropriate vm with callback number.   Attempting to get pt quick follow up appt with HV TOC for resources and medication compliance. Pt listed as no PCP, possibly homeless (living in [place] per [facility] note 5/11) or able to stay with friend for a while. Pt admitted for CHF and was unable to get diuretics x 2 days from pharmacy. Pt needs more resources to aid in prevention of readmission risk.   Awaiting return call at this time, hopeful to get pt scheduled for clinic appt soon.   Ozella Rocks, RN, BSN Heart Failure Nurse Navigator [phone]

## 2020-11-08 ENCOUNTER — Telehealth (HOSPITAL_COMMUNITY): Payer: Self-pay

## 2020-11-08 NOTE — Telephone Encounter (Signed)
Heart Failure Nurse Navigator Progress Note  Briefly spoke with patient (pt was driving and had a male friend in the car with him). Pt stated "had a hard time breathing last night", couldn't get neb medication from [facility] pharmacy unable to order it until July. Uses [facility] on [address].   Explained HV TOC and pt agreeable to scheduling appt for Monday 5/23 @ 11AM. Hx of medication noncompliance, possibly homeless/couch surfing per hospitalization notes from [facility].   Confirmed appt time, date, location, and private transportation prior to ending call.   Ozella Rocks, RN, BSN Heart Failure Nurse Navigator [phone]

## 2020-11-14 NOTE — Progress Notes (Incomplete)
[facility] Trans[facility]  PCP: None Primary Cardiologist: Donato Schultz  HPI:  Ray Smith is a 75 y.o.  male  with a PMH significant for ischemic cardiomyopathy, HFrEF EF 25-30% (2021), presumed COPD on 5L [place] oxygen chronically, CAD status post complex PCI to LM, LAD and Lcx, PVD s/p angioplasty in 2019, CKD stage II, hypertension, hyperlipidemia, tobacco use disorder, poor medication adherence, history of being abusive to staff, poor outpatient cardiology follow up.    Cardiac history began in 10/23/2013 where he presented with chest pain and STEMI. EKG suggested inferior ST elevation and code STEMI was called and the pt was taken to the cath lab. Cath revealed 40-60% distal LM, 80% ostial LAD and 50% mid LAD. CFX had a 50-70% stenosis, and RCA a 50% stenosis. EF was 60%. He was offered potential treatment options which included medical therapy versus CABG. He refused consideration of CABG.  He also refused to take any 'blood-thinning' medications. He refused heparin while in the hospital. He agreed to take a daily aspirin.  His chest pain resolved on it's own, his troponin remained within the normal range and was discharged.    12/2017 patient developed chest pain and presented to [facility] with NSTEMI.  Was again hesitant about cath procedure however did ultimately agree.  Went for [facility] which demonstrated progression of CAD and 100% occlusion of ostial to proximal LAD.  Underwent complex PCI left mainstem/proximal LAD/proximal circumflex.  Also noted to have severe LV systolic dysfunction noted at cath with LVEF < 30%.  Had ECHO with EF 25-30%  On discharge medication compliance remained a serious concern but agreed to take dapt.    Recently Admitted 11/02/20 with acute on chronic combined CHF.  He self reported poor medication adherence prior to admission. Received IV lasix 40mg  x 2days. Had a repeat ECHO with EF 25-30%, G2DD, mild to moderate AS, suspected low flow low  gradient AS.  Discharged on  Weight 110-109.  Cr 1.5-1.6.  Discharge meds below.       ROS: All systems negative except as listed in HPI, PMH and Problem List.  SH:  Social History   Socioeconomic History  . Marital status: Divorced    Spouse name: Not on file  . Number of children: Not on file  . Years of education: Not on file  . Highest education level: Not on file  Occupational History  . Not on file  Tobacco Use  . Smoking status: Former  . Smokeless tobacco: Never Used  . Tobacco comment: Last smoked 20 years ago  Substance and Sexual Activity  . Alcohol use: Not Currently    Comment: Not in 21 days  . Drug use: Never    Types: Marijuana  . Sexual activity: Not on file  Other Topics Concern  . Not on file  Social History Narrative   ** Merged History Encounter **       Social Determinants of Health   Financial Resource Strain: Not on file  Food Insecurity: Not on file  Transportation Needs: Not on file  Physical Activity: Not on file  Stress: Not on file  Social Connections: Not on file  Intimate Partner Violence: Not on file    FH:  Family History  Problem Relation Age of Onset  . Heart disease Mother   . Alzheimer's disease Father   . Multiple sclerosis Sister   . Diabetes Maternal Grandmother   . Drug abuse Other  Past Medical History:  Diagnosis Date  . Acute ST elevation myocardial infarction (STEMI) involving left anterior descending (LAD) coronary artery (HCC) 12/30/2017  . Acute systolic heart failure (HCC) 12/30/2017  . COPD (chronic obstructive pulmonary disease) (HCC)   . Coronary artery disease    a.  presented with CP and inf STE but no ACS - LHC (10/23/13):  Dist LM 40-60%, ostial LAD 80%, mid LAD 50%, ostial CFX 50-70%, mid RCA 50%.  EF 60%. - CP not felt to be ischemic; CABG vs Med Rx d/w pt - pt opted for Med Rx  . History of kidney stones    "more than 20" (01/09/2018)  . Hyperlipidemia   . Hypertension   . Marijuana  abuse   . MI (mitral incompetence)   . Tobacco dependence     Current Outpatient Medications  Medication Sig Dispense Refill  . albuterol (PROVENTIL) (5 MG/ML) 0.5% nebulizer solution Take 0.5 mLs (2.5 mg total) by nebulization every 6 (six) hours as needed for wheezing or shortness of breath. 45 mL 5  . albuterol (VENTOLIN HFA) 108 (90 Base) MCG/ACT inhaler Inhale 2 puffs into the lungs every 6 (six) hours as needed for wheezing or shortness of breath. 8 g 6  . aspirin 81 MG EC tablet Take 1 tablet (81 mg total) by mouth daily. 30 tablet 2  . atorvastatin (LIPITOR) 80 MG tablet Take 1 tablet (80 mg total) by mouth daily at 6 PM. 30 tablet 2  . carvedilol (COREG) 3.125 MG tablet Take 1 tablet (3.125 mg total) by mouth 2 (two) times daily with a meal. 60 tablet 2  . clopidogrel (PLAVIX) 75 MG tablet Take 1 tablet (75 mg total) by mouth daily. 30 tablet 0  . furosemide (LASIX) 40 MG tablet Take 1 tablet (40 mg total) by mouth every other day. If u feel short of breath, you may take daily for 3 days then go back to every other day 15 tablet 2  . ipratropium (ATROVENT) 0.03 % nasal spray Place 2 sprays into both nostrils every 12 (twelve) hours. 30 mL 12  . nitroGLYCERIN (NITROSTAT) 0.4 MG SL tablet Place 1 tablet (0.4 mg total) under the tongue every 5 (five) minutes x 3 doses as needed for chest pain. 25 tablet 2  . OXYGEN Inhale 5 L/min into the lungs at bedtime as needed (for shortness of breath).    . pantoprazole (PROTONIX) 40 MG tablet TAKE 1 TABLET BY MOUTH EVERYDAY AT BEDTIME 30 tablet 2  . Tiotropium Bromide-Olodaterol (STIOLTO RESPIMAT) 2.5-2.5 MCG/ACT AERS INHALE 2 PUFFS BY MOUTH INTO THE LUNGS DAILY 1 each 6   No current facility-administered medications for this visit.    There were no vitals filed for this visit.  PHYSICAL EXAM: ***   ECG   ASSESSMENT & PLAN: Chronic Combined Systolic and Diastolic CHF: -ischemic cardiomyopathy, normal EF prior to NSTEMI in 2019, hx of  poor medication adherence, poor follow up -EF has remained 25-30% since then.  Most recent ECHO EF 25-30%, G2DD, mild to moderate AS, suspected low flow low gradient AS. -2015 cath with multivessel disease refused cabg, NSTEMI 2019 [facility] with progression of severe multivessel disease and underwent complex PCI left mainstem/proximal LAD/proximal circumflex. NYHA Class  PAD:  CKD IIIa   Refer to Social Work:  PCP  Medications  Transportation   ETOH   Drug Abuse Programme researcher, broadcasting/film/video to Pharmacy:  Refer to Artist :  Refer to Home Health:  Refer to [facility]:  Refer to General Cardiology Other    Follow up

## 2020-11-15 ENCOUNTER — Inpatient Hospital Stay (HOSPITAL_COMMUNITY)
Admission: EM | Admit: 2020-11-15 | Discharge: 2020-11-19 | DRG: 291 | Disposition: A | Discharge status: Home / Self Care | Payer: Medicare HMO | Attending: Internal Medicine | Admitting: Internal Medicine

## 2020-11-15 ENCOUNTER — Emergency Department (HOSPITAL_COMMUNITY): Payer: Medicare HMO

## 2020-11-15 ENCOUNTER — Encounter (HOSPITAL_COMMUNITY): Payer: Self-pay

## 2020-11-15 ENCOUNTER — Encounter (HOSPITAL_COMMUNITY): Payer: Medicare HMO

## 2020-11-15 ENCOUNTER — Other Ambulatory Visit: Payer: Self-pay

## 2020-11-15 DIAGNOSIS — Z8249 Family history of ischemic heart disease and other diseases of the circulatory system: Secondary | ICD-10-CM

## 2020-11-15 DIAGNOSIS — Z9119 Patient's noncompliance with other medical treatment and regimen: Secondary | ICD-10-CM

## 2020-11-15 DIAGNOSIS — Z681 Body mass index (BMI) 19 or less, adult: Secondary | ICD-10-CM

## 2020-11-15 DIAGNOSIS — N1831 Chronic kidney disease, stage 3a: Secondary | ICD-10-CM | POA: Diagnosis present

## 2020-11-15 DIAGNOSIS — E7800 Pure hypercholesterolemia, unspecified: Secondary | ICD-10-CM | POA: Diagnosis present

## 2020-11-15 DIAGNOSIS — I509 Heart failure, unspecified: Secondary | ICD-10-CM

## 2020-11-15 DIAGNOSIS — Z7902 Long term (current) use of antithrombotics/antiplatelets: Secondary | ICD-10-CM

## 2020-11-15 DIAGNOSIS — Z820 Family history of epilepsy and other diseases of the nervous system: Secondary | ICD-10-CM

## 2020-11-15 DIAGNOSIS — I252 Old myocardial infarction: Secondary | ICD-10-CM

## 2020-11-15 DIAGNOSIS — Z825 Family history of asthma and other chronic lower respiratory diseases: Secondary | ICD-10-CM

## 2020-11-15 DIAGNOSIS — Z9862 Peripheral vascular angioplasty status: Secondary | ICD-10-CM

## 2020-11-15 DIAGNOSIS — R64 Cachexia: Secondary | ICD-10-CM | POA: Diagnosis present

## 2020-11-15 DIAGNOSIS — M79602 Pain in left arm: Secondary | ICD-10-CM | POA: Diagnosis present

## 2020-11-15 DIAGNOSIS — Z885 Allergy status to narcotic agent status: Secondary | ICD-10-CM

## 2020-11-15 DIAGNOSIS — I2510 Atherosclerotic heart disease of native coronary artery without angina pectoris: Secondary | ICD-10-CM | POA: Diagnosis present

## 2020-11-15 DIAGNOSIS — I248 Other forms of acute ischemic heart disease: Secondary | ICD-10-CM | POA: Diagnosis present

## 2020-11-15 DIAGNOSIS — Z7982 Long term (current) use of aspirin: Secondary | ICD-10-CM

## 2020-11-15 DIAGNOSIS — K219 Gastro-esophageal reflux disease without esophagitis: Secondary | ICD-10-CM | POA: Diagnosis present

## 2020-11-15 DIAGNOSIS — J449 Chronic obstructive pulmonary disease, unspecified: Secondary | ICD-10-CM

## 2020-11-15 DIAGNOSIS — Z20822 Contact with and (suspected) exposure to covid-19: Secondary | ICD-10-CM | POA: Diagnosis present

## 2020-11-15 DIAGNOSIS — Z955 Presence of coronary angioplasty implant and graft: Secondary | ICD-10-CM

## 2020-11-15 DIAGNOSIS — J9611 Chronic respiratory failure with hypoxia: Secondary | ICD-10-CM | POA: Diagnosis present

## 2020-11-15 DIAGNOSIS — Z886 Allergy status to analgesic agent status: Secondary | ICD-10-CM

## 2020-11-15 DIAGNOSIS — I130 Hypertensive heart and chronic kidney disease with heart failure and stage 1 through stage 4 chronic kidney disease, or unspecified chronic kidney disease: Principal | ICD-10-CM | POA: Diagnosis present

## 2020-11-15 DIAGNOSIS — J9811 Atelectasis: Secondary | ICD-10-CM | POA: Diagnosis present

## 2020-11-15 DIAGNOSIS — R008 Other abnormalities of heart beat: Secondary | ICD-10-CM | POA: Diagnosis present

## 2020-11-15 DIAGNOSIS — Z8673 Personal history of transient ischemic attack (TIA), and cerebral infarction without residual deficits: Secondary | ICD-10-CM

## 2020-11-15 DIAGNOSIS — Z833 Family history of diabetes mellitus: Secondary | ICD-10-CM

## 2020-11-15 DIAGNOSIS — I70201 Unspecified atherosclerosis of native arteries of extremities, right leg: Secondary | ICD-10-CM | POA: Diagnosis present

## 2020-11-15 DIAGNOSIS — Z87442 Personal history of urinary calculi: Secondary | ICD-10-CM

## 2020-11-15 DIAGNOSIS — Z79899 Other long term (current) drug therapy: Secondary | ICD-10-CM

## 2020-11-15 DIAGNOSIS — E785 Hyperlipidemia, unspecified: Secondary | ICD-10-CM | POA: Diagnosis present

## 2020-11-15 DIAGNOSIS — I255 Ischemic cardiomyopathy: Secondary | ICD-10-CM | POA: Diagnosis present

## 2020-11-15 DIAGNOSIS — Z87891 Personal history of nicotine dependence: Secondary | ICD-10-CM

## 2020-11-15 DIAGNOSIS — R7989 Other specified abnormal findings of blood chemistry: Secondary | ICD-10-CM

## 2020-11-15 DIAGNOSIS — R079 Chest pain, unspecified: Secondary | ICD-10-CM

## 2020-11-15 DIAGNOSIS — I5043 Acute on chronic combined systolic (congestive) and diastolic (congestive) heart failure: Secondary | ICD-10-CM | POA: Diagnosis present

## 2020-11-15 DIAGNOSIS — E8779 Other fluid overload: Secondary | ICD-10-CM

## 2020-11-15 DIAGNOSIS — I472 Ventricular tachycardia: Secondary | ICD-10-CM | POA: Diagnosis not present

## 2020-11-15 DIAGNOSIS — I2582 Chronic total occlusion of coronary artery: Secondary | ICD-10-CM | POA: Diagnosis present

## 2020-11-15 DIAGNOSIS — I440 Atrioventricular block, first degree: Secondary | ICD-10-CM | POA: Diagnosis present

## 2020-11-15 DIAGNOSIS — R54 Age-related physical debility: Secondary | ICD-10-CM | POA: Diagnosis present

## 2020-11-15 DIAGNOSIS — Z9981 Dependence on supplemental oxygen: Secondary | ICD-10-CM

## 2020-11-15 DIAGNOSIS — R0602 Shortness of breath: Secondary | ICD-10-CM

## 2020-11-15 DIAGNOSIS — Z96659 Presence of unspecified artificial knee joint: Secondary | ICD-10-CM | POA: Diagnosis present

## 2020-11-15 DIAGNOSIS — I340 Nonrheumatic mitral (valve) insufficiency: Secondary | ICD-10-CM | POA: Diagnosis present

## 2020-11-15 DIAGNOSIS — Z5902 Unsheltered homelessness: Secondary | ICD-10-CM

## 2020-11-15 LAB — CBC WITH DIFFERENTIAL/PLATELET
Abs Immature Granulocytes: 0.04 10*3/uL (ref 0.00–0.07)
Basophils Absolute: 0.1 10*3/uL (ref 0.0–0.1)
Basophils Relative: 1 %
Eosinophils Absolute: 0.1 10*3/uL (ref 0.0–0.5)
Eosinophils Relative: 1 %
HCT: 39.6 % (ref 39.0–52.0)
Hemoglobin: 12.5 g/dL — ABNORMAL LOW (ref 13.0–17.0)
Immature Granulocytes: 1 %
Lymphocytes Relative: 9 %
Lymphs Abs: 0.8 10*3/uL (ref 0.7–4.0)
MCH: 30.1 pg (ref 26.0–34.0)
MCHC: 31.6 g/dL (ref 30.0–36.0)
MCV: 95.4 fL (ref 80.0–100.0)
Monocytes Absolute: 0.6 10*3/uL (ref 0.1–1.0)
Monocytes Relative: 7 %
Neutro Abs: 7.2 10*3/uL (ref 1.7–7.7)
Neutrophils Relative %: 81 %
Platelets: 250 10*3/uL (ref 150–400)
RBC: 4.15 MIL/uL — ABNORMAL LOW (ref 4.22–5.81)
RDW: 14.6 % (ref 11.5–15.5)
WBC: 8.7 10*3/uL (ref 4.0–10.5)
nRBC: 0 % (ref 0.0–0.2)

## 2020-11-15 LAB — TROPONIN I (HIGH SENSITIVITY)
Troponin I (High Sensitivity): 44 ng/L — ABNORMAL HIGH (ref <18)
Troponin I (High Sensitivity): 50 ng/L — ABNORMAL HIGH (ref <18)
Troponin I (High Sensitivity): 53 ng/L — ABNORMAL HIGH (ref <18)

## 2020-11-15 LAB — BRAIN NATRIURETIC PEPTIDE: B Natriuretic Peptide: 3315.3 pg/mL — ABNORMAL HIGH (ref 0.0–100.0)

## 2020-11-15 LAB — BASIC METABOLIC PANEL
Anion gap: 10 (ref 5–15)
BUN: 26 mg/dL — ABNORMAL HIGH (ref 8–23)
CO2: 23 mmol/L (ref 22–32)
Calcium: 8.5 mg/dL — ABNORMAL LOW (ref 8.9–10.3)
Chloride: 109 mmol/L (ref 98–111)
Creatinine, Ser: 1.4 mg/dL — ABNORMAL HIGH (ref 0.61–1.24)
GFR, Estimated: 52 mL/min — ABNORMAL LOW (ref >60)
Glucose, Bld: 98 mg/dL (ref 70–99)
Potassium: 3.6 mmol/L (ref 3.5–5.1)
Sodium: 142 mmol/L (ref 135–145)

## 2020-11-15 LAB — MAGNESIUM: Magnesium: 2.2 mg/dL (ref 1.7–2.4)

## 2020-11-15 LAB — RESP PANEL BY RT-PCR (FLU A&B, COVID) ARPGX2
Influenza A by PCR: NEGATIVE
Influenza B by PCR: NEGATIVE
SARS Coronavirus 2 by RT PCR: NEGATIVE

## 2020-11-15 MED ORDER — FUROSEMIDE 10 MG/ML IJ SOLN
40.0000 mg | Freq: Once | INTRAMUSCULAR | Status: AC
  Administered 2020-11-15: 40 mg via INTRAVENOUS
  Filled 2020-11-15: qty 4

## 2020-11-15 MED ORDER — SENNOSIDES-DOCUSATE SODIUM 8.6-50 MG PO TABS: 1.0000 | ORAL_TABLET | Freq: Every evening | ORAL | Status: DC | PRN

## 2020-11-15 MED ORDER — ACETAMINOPHEN 650 MG RE SUPP: 650.0000 mg | Freq: Four times a day (QID) | RECTAL | Status: DC | PRN

## 2020-11-15 MED ORDER — CARVEDILOL 3.125 MG PO TABS: 3.1250 mg | ORAL_TABLET | Freq: Two times a day (BID) | ORAL | Status: DC

## 2020-11-15 MED ORDER — NITROGLYCERIN 0.4 MG SL SUBL: 0.4000 mg | SUBLINGUAL_TABLET | SUBLINGUAL | Status: DC | PRN

## 2020-11-15 MED ORDER — ENOXAPARIN SODIUM 40 MG/0.4ML IJ SOSY
40.0000 mg | PREFILLED_SYRINGE | INTRAMUSCULAR | Status: DC
  Filled 2020-11-15 (×2): qty 0.4

## 2020-11-15 MED ORDER — UMECLIDINIUM BROMIDE 62.5 MCG/INH IN AEPB
1.0000 | INHALATION_SPRAY | Freq: Every day | RESPIRATORY_TRACT | Status: DC
  Administered 2020-11-16 – 2020-11-19 (×4): 1 via RESPIRATORY_TRACT
  Filled 2020-11-15: qty 7

## 2020-11-15 MED ORDER — NITROGLYCERIN 2 % TD OINT
1.0000 [in_us] | TOPICAL_OINTMENT | Freq: Once | TRANSDERMAL | Status: AC
  Administered 2020-11-15: 1 [in_us] via TOPICAL
  Filled 2020-11-15: qty 1

## 2020-11-15 MED ORDER — CARVEDILOL 6.25 MG PO TABS
6.2500 mg | ORAL_TABLET | Freq: Two times a day (BID) | ORAL | Status: DC
Start: 2020-11-15 — End: 2020-11-19
  Administered 2020-11-15 – 2020-11-19 (×8): 6.25 mg via ORAL
  Filled 2020-11-15 (×8): qty 1

## 2020-11-15 MED ORDER — ALBUTEROL SULFATE (2.5 MG/3ML) 0.083% IN NEBU: 3.0000 mL | INHALATION_SOLUTION | Freq: Four times a day (QID) | RESPIRATORY_TRACT | Status: DC | PRN

## 2020-11-15 MED ORDER — IPRATROPIUM-ALBUTEROL 0.5-2.5 (3) MG/3ML IN SOLN: 3.0000 mL | RESPIRATORY_TRACT | Status: DC | PRN

## 2020-11-15 MED ORDER — POTASSIUM CHLORIDE 20 MEQ PO PACK
40.0000 meq | PACK | Freq: Two times a day (BID) | ORAL | Status: AC
  Administered 2020-11-15 (×2): 40 meq via ORAL
  Filled 2020-11-15 (×3): qty 2

## 2020-11-15 MED ORDER — ATORVASTATIN CALCIUM 80 MG PO TABS
80.0000 mg | ORAL_TABLET | Freq: Every day | ORAL | Status: DC
  Administered 2020-11-15 – 2020-11-18 (×4): 80 mg via ORAL
  Filled 2020-11-15 (×4): qty 1

## 2020-11-15 MED ORDER — SODIUM CHLORIDE 0.9% FLUSH
3.0000 mL | Freq: Two times a day (BID) | INTRAVENOUS | Status: DC
  Administered 2020-11-15 – 2020-11-19 (×9): 3 mL via INTRAVENOUS

## 2020-11-15 MED ORDER — ASPIRIN EC 81 MG PO TBEC
81.0000 mg | DELAYED_RELEASE_TABLET | Freq: Every day | ORAL | Status: DC
  Administered 2020-11-15 – 2020-11-19 (×5): 81 mg via ORAL
  Filled 2020-11-15 (×5): qty 1

## 2020-11-15 MED ORDER — PANTOPRAZOLE SODIUM 40 MG PO TBEC
40.0000 mg | DELAYED_RELEASE_TABLET | Freq: Every day | ORAL | Status: DC
  Administered 2020-11-15 – 2020-11-18 (×4): 40 mg via ORAL
  Filled 2020-11-15 (×4): qty 1

## 2020-11-15 MED ORDER — ACETAMINOPHEN 325 MG PO TABS: 650.0000 mg | ORAL_TABLET | Freq: Four times a day (QID) | ORAL | Status: DC | PRN

## 2020-11-15 MED ORDER — ONDANSETRON HCL 4 MG/2ML IJ SOLN: 4.0000 mg | Freq: Four times a day (QID) | INTRAMUSCULAR | Status: DC | PRN

## 2020-11-15 MED ORDER — FUROSEMIDE 10 MG/ML IJ SOLN
40.0000 mg | Freq: Two times a day (BID) | INTRAMUSCULAR | Status: DC
  Administered 2020-11-15 – 2020-11-16 (×3): 40 mg via INTRAVENOUS
  Filled 2020-11-15 (×3): qty 4

## 2020-11-15 MED ORDER — ONDANSETRON HCL 4 MG PO TABS: 4.0000 mg | ORAL_TABLET | Freq: Four times a day (QID) | ORAL | Status: DC | PRN

## 2020-11-15 NOTE — ED Notes (Signed)
Spoke with Vincente Liberty, Diplomatic Services operational officer, on 3E and she is relaying my number to the receiving RN to call for report.

## 2020-11-15 NOTE — ED Provider Notes (Signed)
[facility] EMERGENCY DEPARTMENT Provider Note   CSN: [id number] Arrival date & time: 11/15/20  1122     History Chief Complaint  Patient presents with  . Chest Pain    Ray Smith is a 75 y.o. male.   Chest Pain Pain location:  Substernal area and L chest Pain quality: pressure   Pain radiates to:  L arm and L shoulder Pain severity:  Moderate Onset quality:  Gradual Duration:  1 day Timing:  Constant Progression:  Partially resolved Chronicity:  Recurrent Context comment:  Was working at the [employer] yesterday and went fishing Relieved by:  Nitroglycerin, rest and aspirin Worsened by:  Nothing Ineffective treatments:  None tried Associated symptoms: shortness of breath   Associated symptoms: no back pain, no cough, no fever, no headache, no nausea, no palpitations and no vomiting   Risk factors: coronary artery disease        Past Medical History:  Diagnosis Date  . Acute ST elevation myocardial infarction (STEMI) involving left anterior descending (LAD) coronary artery (HCC) 12/30/2017  . Acute systolic heart failure (HCC) 12/30/2017  . COPD (chronic obstructive pulmonary disease) (HCC)   . Coronary artery disease    a.  presented with CP and inf STE but no ACS - LHC (10/23/13):  Dist LM 40-60%, ostial LAD 80%, mid LAD 50%, ostial CFX 50-70%, mid RCA 50%.  EF 60%. - CP not felt to be ischemic; CABG vs Med Rx d/w pt - pt opted for Med Rx  . History of kidney stones    "more than 20" (01/09/2018)  . Hyperlipidemia   . Hypertension   . Marijuana abuse   . MI (mitral incompetence)   . Tobacco dependence     Patient Active Problem List   Diagnosis Date Noted  . Acute decompensated heart failure (HCC) 11/02/2020  . PVD (peripheral vascular disease) (HCC) 11/02/2020  . COPD with acute exacerbation (HCC) 09/03/2019  . Acute respiratory failure (HCC) 09/03/2019  . Acute exacerbation of CHF (congestive heart failure) (HCC) 09/02/2019  .  Adjustment disorder with mixed disturbance of emotions and conduct   . Protein-calorie malnutrition, severe 01/10/2018  . Orthostatic hypotension 01/09/2018  . Weakness 01/09/2018  . Mouth pain 01/09/2018  . CKD (chronic kidney disease) stage 2, GFR 60-89 ml/min 01/09/2018  . Marijuana abuse 01/09/2018  . Acute systolic heart failure (HCC) 01/02/2018  . STEMI (ST elevation myocardial infarction) (HCC) 12/30/2017  . STEMI involving left anterior descending coronary artery (HCC) 12/30/2017  . Acute encephalopathy 10/06/2017  . AKI (acute kidney injury) (HCC)   . Altered mental status   . Hypertension 11/12/2013  . Dyslipidemia 10/24/2013  . ACS (acute coronary syndrome) (HCC) 10/23/2013  . Chest pain 10/23/2013  . Coronary Artery Disease 10/23/2013  . COPD (chronic obstructive pulmonary disease) (HCC) 10/23/2013  . Tobacco abuse 10/23/2013  . Precordial pain 10/23/2013    Past Surgical History:  Procedure Laterality Date  . CARDIAC CATHETERIZATION    . CORONARY/GRAFT ACUTE MI REVASCULARIZATION N/A 12/30/2017   Procedure: Coronary/Graft Acute MI Revascularization;  Surgeon: Tonny Bollman, MD;  Location: [facility] INVASIVE CV LAB;  Service: Cardiovascular;  Laterality: N/A;  Distal LM into LAD Xience [place] 3.5x62mm Ostial Cx Xience [place] 3.5x59mm  . LEFT HEART CATH AND CORONARY ANGIOGRAPHY N/A 12/30/2017   Procedure: LEFT HEART CATH AND CORONARY ANGIOGRAPHY;  Surgeon: Tonny Bollman, MD;  Location: [facility] INVASIVE CV LAB;  Service: Cardiovascular;  Laterality: N/A;  . LEFT HEART CATHETERIZATION WITH CORONARY ANGIOGRAM  Bilateral 10/23/2013   Procedure: LEFT HEART CATHETERIZATION WITH CORONARY ANGIOGRAM;  Surgeon: Lesleigh Noe, MD;  Location: [facility] CATH LAB;  Service: Cardiovascular;  Laterality: Bilateral;  . snake bite surgery    . TONSILLECTOMY  1955  . TOTAL KNEE ARTHROPLASTY  2001       Family History  Problem Relation Age of Onset  . Heart disease Mother   . Alzheimer's disease  Father   . Multiple sclerosis Sister   . Diabetes Maternal Grandmother   . Drug abuse Other     Social History   Tobacco Use  . Smoking status: Former Games developer  . Smokeless tobacco: Never Used  . Tobacco comment: Last smoked 20 years ago  Substance Use Topics  . Alcohol use: Not Currently    Comment: Not in 21 days  . Drug use: Never    Types: Marijuana    Home Medications Prior to Admission medications   Medication Sig Start Date End Date Taking? Authorizing Provider  albuterol (PROVENTIL) (5 MG/ML) 0.5% nebulizer solution Take 0.5 mLs (2.5 mg total) by nebulization every 6 (six) hours as needed for wheezing or shortness of breath. 11/03/20  Yes Aslam, Leanna Sato, MD  albuterol (VENTOLIN HFA) 108 (90 Base) MCG/ACT inhaler Inhale 2 puffs into the lungs every 6 (six) hours as needed for wheezing or shortness of breath. 11/03/20  Yes Aslam, Leanna Sato, MD  aspirin 81 MG EC tablet Take 1 tablet (81 mg total) by mouth daily. 11/03/20  Yes Aslam, Leanna Sato, MD  atorvastatin (LIPITOR) 80 MG tablet Take 1 tablet (80 mg total) by mouth daily at 6 PM. 11/03/20  Yes Aslam, Leanna Sato, MD  carvedilol (COREG) 3.125 MG tablet Take 1 tablet (3.125 mg total) by mouth 2 (two) times daily with a meal. 11/03/20 12/03/20 Yes Aslam, Sadia, MD  clopidogrel (PLAVIX) 75 MG tablet Take 1 tablet (75 mg total) by mouth daily. 11/03/20  Yes Aslam, Leanna Sato, MD  furosemide (LASIX) 40 MG tablet Take 1 tablet (40 mg total) by mouth every other day. If u feel short of breath, you may take daily for 3 days then go back to every other day 11/03/20  Yes Aslam, Sadia, MD  ipratropium (ATROVENT) 0.03 % nasal spray Place 2 sprays into both nostrils every 12 (twelve) hours. 11/03/20  Yes Aslam, Leanna Sato, MD  nitroGLYCERIN (NITROSTAT) 0.4 MG SL tablet Place 1 tablet (0.4 mg total) under the tongue every 5 (five) minutes x 3 doses as needed for chest pain. 11/03/20  Yes Aslam, Leanna Sato, MD  pantoprazole (PROTONIX) 40 MG tablet TAKE 1 TABLET BY MOUTH EVERYDAY AT  BEDTIME Patient taking differently: Take 40 mg by mouth at bedtime. 11/03/20  Yes Aslam, Leanna Sato, MD  Tiotropium Bromide-Olodaterol (STIOLTO RESPIMAT) 2.5-2.5 MCG/ACT AERS INHALE 2 PUFFS BY MOUTH INTO THE LUNGS DAILY Patient taking differently: Inhale 2 puffs into the lungs daily. 11/03/20  Yes Aslam, Leanna Sato, MD  OXYGEN Inhale 5 L/min into the lungs at bedtime as needed (for shortness of breath).    [provider]    Allergies    Tramadol, Flexeril [cyclobenzaprine], Ibuprofen, Ibuprofen, and Lactose intolerance (gi)  Review of Systems   Review of Systems  Constitutional: Negative for chills and fever.  HENT: Negative for congestion and rhinorrhea.   Respiratory: Positive for shortness of breath. Negative for cough.   Cardiovascular: Positive for chest pain. Negative for palpitations.  Gastrointestinal: Negative for diarrhea, nausea and vomiting.  Genitourinary: Negative for difficulty urinating and dysuria.  Musculoskeletal: Negative for arthralgias and back  pain.  Skin: Negative for color change and rash.  Neurological: Negative for light-headedness and headaches.    Physical Exam Updated Vital Signs BP (!) 167/98   Pulse 86   Temp 98.7 F (37.1 C) (Oral)   Resp (!) 26   SpO2 98%   Physical Exam Vitals and nursing note reviewed.  Constitutional:      General: He is not in acute distress.    Appearance: Normal appearance.  HENT:     Head: Normocephalic and atraumatic.     Nose: No rhinorrhea.  Eyes:     General:        Right eye: No discharge.        Left eye: No discharge.     Conjunctiva/sclera: Conjunctivae normal.  Cardiovascular:     Rate and Rhythm: Normal rate and regular rhythm.  Pulmonary:     Effort: Pulmonary effort is normal.     Breath sounds: No stridor. Wheezing (faint end exp) present.  Abdominal:     General: Abdomen is flat. There is no distension.     Palpations: Abdomen is soft.  Musculoskeletal:        General: No deformity or signs of  injury.     Right lower leg: Edema (1+ to the mid leg) present.     Left lower leg: Edema (1+ to the mid leg) present.  Skin:    General: Skin is warm and dry.  Neurological:     General: No focal deficit present.     Mental Status: He is alert. Mental status is at baseline.     Motor: No weakness.  Psychiatric:        Mood and Affect: Mood normal.        Behavior: Behavior normal.        Thought Content: Thought content normal.     ED Results / Procedures / Treatments   Labs (all labs ordered are listed, but only abnormal results are displayed) Labs Reviewed  CBC WITH DIFFERENTIAL/PLATELET - Abnormal; Notable for the following components:      Result Value   RBC 4.15 (*)    Hemoglobin 12.5 (*)    All other components within normal limits  BASIC METABOLIC PANEL - Abnormal; Notable for the following components:   BUN 26 (*)    Creatinine, Ser 1.40 (*)    Calcium 8.5 (*)    GFR, Estimated 52 (*)    All other components within normal limits  BRAIN NATRIURETIC PEPTIDE - Abnormal; Notable for the following components:   B Natriuretic Peptide 3,315.3 (*)    All other components within normal limits  TROPONIN I (HIGH SENSITIVITY) - Abnormal; Notable for the following components:   Troponin I (High Sensitivity) 44 (*)    All other components within normal limits  RESP PANEL BY RT-PCR (FLU A&B, COVID) ARPGX2  TROPONIN I (HIGH SENSITIVITY)    EKG EKG Interpretation  Date/Time:  Monday Nov 15 2020 11:28:12 EDT Ventricular Rate:  96 PR Interval:  248 QRS Duration: 155 QT Interval:  429 QTC Calculation: 543 R Axis:   261 Text Interpretation: Sinus rhythm Supraventricular bigeminy Prolonged PR interval Left atrial enlargement Right bundle branch block Inferior infarct, old Anterior infarct, age indeterminate Confirmed by Cherlynn Perches ([id number]) on 11/15/2020 11:49:06 AM   Radiology DG Chest Portable 1 View  Result Date: 11/15/2020 CLINICAL DATA:  Shortness of breath and chest  pain EXAM: PORTABLE CHEST 1 VIEW COMPARISON:  11/04/2019 FINDINGS: Artifact overlies the chest. Cardiomegaly and  aortic atherosclerosis as seen previously. Bilateral effusions persist, with associated atelectasis and or infiltrate at both lung bases. Upper lungs remain clear. IMPRESSION: Persistent bilateral effusions with atelectasis and or pneumonia in both lower lobes. Cardiomegaly and aortic atherosclerosis as seen previously. Electronically Signed   By: Paulina Fusi M.D.   On: 11/15/2020 12:43    Procedures Procedures   Medications Ordered in ED Medications  potassium chloride (KLOR-CON) packet 40 mEq (has no administration in time range)  enoxaparin (LOVENOX) injection 40 mg (has no administration in time range)  sodium chloride flush (NS) 0.9 % injection 3 mL (3 mLs Intravenous Given 11/15/20 1437)  acetaminophen (TYLENOL) tablet 650 mg (has no administration in time range)    Or  acetaminophen (TYLENOL) suppository 650 mg (has no administration in time range)  senna-docusate (Senokot-S) tablet 1 tablet (has no administration in time range)  ondansetron (ZOFRAN) tablet 4 mg (has no administration in time range)    Or  ondansetron (ZOFRAN) injection 4 mg (has no administration in time range)  furosemide (LASIX) injection 40 mg (40 mg Intravenous Given 11/15/20 1432)  nitroGLYCERIN (NITROGLYN) 2 % ointment 1 inch (1 inch Topical Given 11/15/20 1429)    ED Course  I have reviewed the triage vital signs and the nursing notes.  Pertinent labs & imaging results that were available during my care of the patient were reviewed by me and considered in my medical decision making (see chart for details).    MDM Rules/Calculators/A&P                          Patient with significant coronary disease and heart failure and COPD comes with shortness of breath chest pain.  I feel more so this is heart related given his symptoms, he feels this as well.  EKG shows sinus rhythm with right bundle  branch block, irregular rhythm nonspecific T wave findings.  Previous fairly unchanged.  Will get chest x-ray will get screening labs.  Did take full dose aspirin and nitroglycerin prior to arrival.  He is feeling much better now than he was yesterday.  Had issues with obtaining help yesterday [place] and EMS could not track him down.  Patient symptoms mildly resolved with nitroglycerin.  Does appear fluid overloaded on exam that with clinical findings on laboratory studies and x-ray after my radiology review shows fluid overload.  Will give Lasix and likely admit for dyspnea on exertion in the setting of fluid overload likely in the setting of hypertension.  Final Clinical Impression(s) / ED Diagnoses Final diagnoses:  Other hypervolemia  SOB (shortness of breath)  Chest pain, unspecified type  Elevated brain natriuretic peptide (BNP) level    Rx / DC Orders ED Discharge Orders    None       Sabino Donovan, MD 11/15/20 1504

## 2020-11-15 NOTE — ED Notes (Signed)
Patient given apple juice

## 2020-11-15 NOTE — Hospital Course (Addendum)
Left shoulder, arm and neck pain - Breathing is okay as long as oxygen is on - legs feel about the same (no significant change in LE edema) - requesting to drink water - requesting to get up and walk

## 2020-11-15 NOTE — ED Triage Notes (Signed)
Pt BIB [facility] after calling for chest pain since last night. Pt reports SOB changing to chest pain after working yesterday and going fishing. Pt reports taking 5 nitro since last night with intermittent relief, EMS gave 324mg  aspirin en route.

## 2020-11-15 NOTE — H&P (Addendum)
Date: 11/15/2020               Patient Name:  Ray Smith MRN: [id number]  DOB: 17-Dec-1945 Age / Sex: 75 y.o., male   PCP: Pcp, No              Medical Service: Internal Medicine Teaching Service              Attending Physician: Dr. Earl Lagos, MD    First Contact: Leonides Sake, MS4 Pager: [phone]  Second Contact: Dr. Glenford Bayley Pager: [phone]  Third Contact Dr. Eliezer Bottom Pager: [phone]       After Hours (After 5p/  First Contact Pager: [phone]  weekends / holidays): Second Contact Pager: [phone]   Chief Complaint: "I can't breathe and my chest hurts"  History of Present Illness:  Ray Smith is a 75 year-old man with a history of COPD with oxygen requirement, HF with EF of 25-30% in 5/22, CAD s/p PCI to left main in 2019, PVD s/p angioplasty in 2019, CKD stage II, hypertension, and hyperlipidemia who was recently hospitalized 5/10-5/11 for acute hypoxic respiratory failure due to acute on chronic systolic heart failure who presents to the ED via EMS with chest pain and shortness of breath.   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 The pain radiated down his left arm with associated diaphoresis, nausea with emesis, and chills. The pain increased in intensity over time, with intermittent improvement after taking home nitroglycerin. He reports a history of heart attack a few years ago with similar presentation. Right now, says he feels "awful."   He has also had increased difficulty breathing since last night that is worse than his baseline. His longstanding orthopnea is worse, now having difficulty breathing even sitting up in bed. His legs have been swollen, he has not been able to wear his usual shoes the past few days. He notes that he has been having to utilize his albuterol inhaler more frequently over the past week with seemingly little benefit. He has not been able to use his  nebulizer due to problems with the machine. He typically utilizes 5L oxygen at nighttime and on an as-needed basis; felt like his breathing was fine during the day but he needed oxygen after the chest pain began. He called EMS for assistance yesterday but they were not able to find him because [name], [place].  He denies recent sick contacts, abdominal pain, diarrhea/constipation, dysuria, headache, vision changes.   ED course: Chest pain and breathing difficulty improved slightly with ASA and nitroglycerin. Breathing comfortably with sats in high 90s on 5L O2 nasal cannula. Chest XR revealed chronic pleural effusion and IV Lasix was started for volume overload given LE edema on exam and BNP of 3300.   Meds:  Current Meds  Medication Sig  . albuterol (PROVENTIL) (5 MG/ML) 0.5% nebulizer solution Take 0.5 mLs (2.5 mg total) by nebulization every 6 (six) hours as needed for wheezing or shortness of breath.  Marland Kitchen albuterol (VENTOLIN HFA) 108 (90 Base) MCG/ACT inhaler Inhale 2 puffs into the lungs every 6 (six) hours as needed for wheezing or shortness of breath.  Marland Kitchen aspirin 81 MG EC tablet Take 1 tablet (81 mg total) by mouth daily.  Marland Kitchen atorvastatin (LIPITOR) 80 MG tablet Take 1 tablet (80 mg total) by mouth daily at 6 PM.  . carvedilol (  COREG) 3.125 MG tablet Take 1 tablet (3.125 mg total) by mouth 2 (two) times daily with a meal.  . clopidogrel (PLAVIX) 75 MG tablet Take 1 tablet (75 mg total) by mouth daily.  . furosemide (LASIX) 40 MG tablet Take 1 tablet (40 mg total) by mouth every other day. If u feel short of breath, you may take daily for 3 days then go back to every other day  . ipratropium (ATROVENT) 0.03 % nasal spray Place 2 sprays into both nostrils every 12 (twelve) hours.  . nitroGLYCERIN (NITROSTAT) 0.4 MG SL tablet Place 1 tablet (0.4 mg total) under the tongue every 5 (five) minutes x 3 doses as needed for chest pain.  . pantoprazole (PROTONIX) 40 MG  tablet TAKE 1 TABLET BY MOUTH EVERYDAY AT BEDTIME (Patient taking differently: Take 40 mg by mouth at bedtime.)  . Tiotropium Bromide-Olodaterol (STIOLTO RESPIMAT) 2.5-2.5 MCG/ACT AERS INHALE 2 PUFFS BY MOUTH INTO THE LUNGS DAILY (Patient taking differently: Inhale 2 puffs into the lungs daily.)     Allergies: Allergies as of 11/15/2020 - Review Complete 11/15/2020  Allergen Reaction Noted  . Tramadol Anaphylaxis 09/25/2017  . Flexeril [cyclobenzaprine] Other (See Comments) 10/23/2013  . Ibuprofen Other (See Comments) 03/13/2015  . Ibuprofen Nausea Only and Other (See Comments) 12/21/2019  . Lactose intolerance (gi) Other (See Comments) 10/06/2017   Past Medical History:  Diagnosis Date  . Acute ST elevation myocardial infarction (STEMI) involving left anterior descending (LAD) coronary artery (HCC) 12/30/2017  . Acute systolic heart failure (HCC) 12/30/2017  . COPD (chronic obstructive pulmonary disease) (HCC)   . Coronary artery disease    a.  presented with CP and inf STE but no ACS - LHC (10/23/13):  Dist LM 40-60%, ostial LAD 80%, mid LAD 50%, ostial CFX 50-70%, mid RCA 50%.  EF 60%. - CP not felt to be ischemic; CABG vs Med Rx d/w pt - pt opted for Med Rx  . History of kidney stones    "more than 20" (01/09/2018)  . Hyperlipidemia   . Hypertension   . Marijuana abuse   . MI (mitral incompetence)   . Tobacco dependence     Family History:  MI in father, paternal uncle COPD in paternal uncle   Social History:  Patient currently living in a [place] with his dog. Independent in ADL's.  Remote tobacco use Denies alcohol use Denies illicit drug use  Review of Systems: A complete ROS was negative except as per HPI.   Physical Exam: Blood pressure (!) 167/98, pulse 86, temperature 98.7 F (37.1 C), temperature source Oral, resp. rate (!) 26, SpO2 98 %. General: Cachexic, frail-appearing gentleman sitting upright in bed, appears older than stated age HENT: Normocephalic,  atraumatic. Mucus membranes moist. Sclera non-icteric. Decreased hearing BL. Wearing glasses CV: Distant heart sounds, S1/S2 present. Distal pulses intact. BL 1+ LE pitting edema to mid-shin.  Pulm: Decreased breath sounds BL, absent at bases. Inspiratory wheezes throughout. No increased work of breathing. Able to converse on 5L [place], became short of breath when supplemental O2 decreased  Abd: Nontender, nondistended Neuro: Speech and cognition grossly intact. No grossly apparent focal neurologic deficits  Psych: Thought process logical and linear   Labs:  CMP Latest Ref Rng & Units 11/15/2020 11/03/2020 11/02/2020  Glucose 70 - 99 mg/dL 98 315(Q) 008(Q)  BUN 8 - 23 mg/dL 76(P) 95(K) 93(O)  Creatinine 0.61 - 1.24 mg/dL 6.71(I) 4.58(K) 9.98(P)  Sodium 135 - 145 mmol/L 142 139 140  Potassium 3.5 -  5.1 mmol/L 3.6 4.0 3.9  Chloride 98 - 111 mmol/L 109 103 106  CO2 22 - 32 mmol/L 23 27 27   Calcium 8.9 - 10.3 mg/dL ) 6.9(G) 2.9(B)  Total Protein 6.5 - 8.1 g/dL - 5.3(L) 6.0(L)  Total Bilirubin 0.3 - 1.2 mg/dL - 1.0 0.9  Alkaline Phos 38 - 126 U/L - 133(H) 167(H)  AST 15 - 41 U/L - 20 42(H)  ALT 0 - 44 U/L - 18 24   CBC    Component Value Date/Time   WBC 8.7 11/15/2020 1150   RBC 4.15 (L) 11/15/2020 1150   HGB 12.5 (L) 11/15/2020 1150   HCT 39.6 11/15/2020 1150   PLT 250 11/15/2020 1150   MCV 95.4 11/15/2020 1150   MCH 30.1 11/15/2020 1150   MCHC 31.6 11/15/2020 1150   RDW 14.6 11/15/2020 1150   LYMPHSABS 0.8 11/15/2020 1150   MONOABS 0.6 11/15/2020 1150   EOSABS 0.1 11/15/2020 1150   BASOSABS 0.1 11/15/2020 1150   BNP (last 3 results) Recent Labs    11/02/20 0659 11/15/20 1150  BNP 2,596.6* 3,315.3*   Troponin I 44 Glucose 98  EKG: personally reviewed my interpretation is sinus rhythm with supraventricular bigeminy and prolonged PR interval. RBBB, old inferior infarct, and anterior infarct present on prior EKG.   CXR: personally reviewed my interpretation is persistent  bilateral effusions with atelectasis and or pneumonia in both lower lobes. Cardiomegaly and aortic atherosclerosis as seen previously.  Assessment & Plan by Problem: Principal Problem:   Acute exacerbation of CHF (congestive heart failure) (HCC) Active Problems:  COPD  PVD  CAD Ray Smith is a 75 year old male with PMHx of combined systolic and diastolic heart failure (EF 25-30%), COPD on 4-5L oxygen nightly at baseline, hypertension, Hx of CVA without residual deficits, CKDIIIa, GERD presenting with acutely worsening dyspnea noted to be in acute CHF exacerbation.   #Acute exacerbation of diastolic and systolic CHF Patient was hospitalized on 11/02/20 for CHF exacerbation with EF 25-30% and has subsequently not seen cardiology for follow-up. Current symptoms of dyspnea on exertion, increased LE edema, worsening orthopnea, chronic pleural effusions on CXR most consistent with heart failure exacerbation. While he is having radiating chest pain, EKG is not significantly changed from prior and his troponin of 44 is same as prior admission, less concerning for ACS.  - BNP 3315 from 2596 on 5/10 - IV Lasix 40mg  BID  - Continue cardiac monitoring - Monitor I/Os and weight - f/u TSH, magnesium, repeat Troponin - Strict I&O, Daily Weights - Monitor renal function and replete electrolytes for K>4, Mag >2  #Chronic respiratory failure 2/2 COPD on 5L nasal cannula #Pulmonary cachexia Presentation, including inspiratory rather than expiratory wheezes, more consistent with underlying cardiovascular etiology. - Supplemental O2 with 5L nasal cannula. Patient had air hunger when weaned - Continue home DuoNeb and albuterol  - Incruse 1 puff daily   #Hypertension Systolic to 167, diastolic to 7/10 in ED today. Supraventricular bigeminy and frequent PAC's for which Coreg increased. Suspect this may be contributing to his HF exacerbation and ongoing hypertension.  - Increase home Coreg to 6.25mg  BID -  Consider starting ARB prior to discharge   #History CVA s/p PCI in 2019 #Elevated troponin - Troponin 44>50>53. Suspect this is secondary to demand ischemia. No current active chest pain and no significant changes on EKG noted. - Continue to monitor  - Aspirin 81mg  daily  - Lipitor 80mg  daily  - Nitrostat 0.4mg  q30min x3prn   #  CKD IIIa  Serum creatinine 1.4 today near historical baseline of 1.3-1.4 - Continue to monitor metabolic panel  - Avoid nephrotoxic agents as able and renally dose   #GERD - Continue home protonix  Dispo: Admit patient to Inpatient with expected length of stay greater than 2 midnights.  Signed: Jed Limerick, Medical Student  11/15/2020, 4:25 PM  Pager: [phone]   Attestation for Student Documentation:  I personally was present and performed or re-performed the history, physical exam and medical decision-making activities of this service and have verified that the service and findings are accurately documented in the student's note.  Eliezer Bottom, MD  [facility] PGY-2 11/15/2020, 7:53 PM Pager: [phone]

## 2020-11-16 ENCOUNTER — Other Ambulatory Visit (HOSPITAL_COMMUNITY): Payer: Self-pay

## 2020-11-16 DIAGNOSIS — I255 Ischemic cardiomyopathy: Secondary | ICD-10-CM | POA: Diagnosis present

## 2020-11-16 DIAGNOSIS — Z955 Presence of coronary angioplasty implant and graft: Secondary | ICD-10-CM | POA: Diagnosis not present

## 2020-11-16 DIAGNOSIS — I5043 Acute on chronic combined systolic (congestive) and diastolic (congestive) heart failure: Secondary | ICD-10-CM

## 2020-11-16 DIAGNOSIS — Z5902 Unsheltered homelessness: Secondary | ICD-10-CM | POA: Diagnosis not present

## 2020-11-16 DIAGNOSIS — I2582 Chronic total occlusion of coronary artery: Secondary | ICD-10-CM | POA: Diagnosis present

## 2020-11-16 DIAGNOSIS — R778 Other specified abnormalities of plasma proteins: Secondary | ICD-10-CM

## 2020-11-16 DIAGNOSIS — R079 Chest pain, unspecified: Secondary | ICD-10-CM

## 2020-11-16 DIAGNOSIS — M79602 Pain in left arm: Secondary | ICD-10-CM | POA: Diagnosis present

## 2020-11-16 DIAGNOSIS — I2510 Atherosclerotic heart disease of native coronary artery without angina pectoris: Secondary | ICD-10-CM | POA: Diagnosis present

## 2020-11-16 DIAGNOSIS — I130 Hypertensive heart and chronic kidney disease with heart failure and stage 1 through stage 4 chronic kidney disease, or unspecified chronic kidney disease: Secondary | ICD-10-CM | POA: Diagnosis present

## 2020-11-16 DIAGNOSIS — I25118 Atherosclerotic heart disease of native coronary artery with other forms of angina pectoris: Secondary | ICD-10-CM

## 2020-11-16 DIAGNOSIS — Z20822 Contact with and (suspected) exposure to covid-19: Secondary | ICD-10-CM | POA: Diagnosis present

## 2020-11-16 DIAGNOSIS — I5023 Acute on chronic systolic (congestive) heart failure: Secondary | ICD-10-CM

## 2020-11-16 DIAGNOSIS — I340 Nonrheumatic mitral (valve) insufficiency: Secondary | ICD-10-CM | POA: Diagnosis present

## 2020-11-16 DIAGNOSIS — I252 Old myocardial infarction: Secondary | ICD-10-CM | POA: Diagnosis not present

## 2020-11-16 DIAGNOSIS — R64 Cachexia: Secondary | ICD-10-CM | POA: Diagnosis present

## 2020-11-16 DIAGNOSIS — J9611 Chronic respiratory failure with hypoxia: Secondary | ICD-10-CM | POA: Diagnosis present

## 2020-11-16 DIAGNOSIS — I472 Ventricular tachycardia: Secondary | ICD-10-CM | POA: Diagnosis not present

## 2020-11-16 DIAGNOSIS — R54 Age-related physical debility: Secondary | ICD-10-CM | POA: Diagnosis present

## 2020-11-16 DIAGNOSIS — I248 Other forms of acute ischemic heart disease: Secondary | ICD-10-CM | POA: Diagnosis present

## 2020-11-16 DIAGNOSIS — Z681 Body mass index (BMI) 19 or less, adult: Secondary | ICD-10-CM | POA: Diagnosis not present

## 2020-11-16 DIAGNOSIS — K219 Gastro-esophageal reflux disease without esophagitis: Secondary | ICD-10-CM | POA: Diagnosis present

## 2020-11-16 DIAGNOSIS — R0602 Shortness of breath: Secondary | ICD-10-CM | POA: Diagnosis present

## 2020-11-16 DIAGNOSIS — N1831 Chronic kidney disease, stage 3a: Secondary | ICD-10-CM | POA: Diagnosis present

## 2020-11-16 DIAGNOSIS — I429 Cardiomyopathy, unspecified: Secondary | ICD-10-CM

## 2020-11-16 DIAGNOSIS — Z9981 Dependence on supplemental oxygen: Secondary | ICD-10-CM | POA: Diagnosis not present

## 2020-11-16 DIAGNOSIS — Z8673 Personal history of transient ischemic attack (TIA), and cerebral infarction without residual deficits: Secondary | ICD-10-CM | POA: Diagnosis not present

## 2020-11-16 DIAGNOSIS — E785 Hyperlipidemia, unspecified: Secondary | ICD-10-CM | POA: Diagnosis present

## 2020-11-16 DIAGNOSIS — R008 Other abnormalities of heart beat: Secondary | ICD-10-CM | POA: Diagnosis present

## 2020-11-16 DIAGNOSIS — J9811 Atelectasis: Secondary | ICD-10-CM | POA: Diagnosis present

## 2020-11-16 LAB — COMPREHENSIVE METABOLIC PANEL
ALT: 14 U/L (ref 0–44)
AST: 16 U/L (ref 15–41)
Albumin: 3 g/dL — ABNORMAL LOW (ref 3.5–5.0)
Alkaline Phosphatase: 115 U/L (ref 38–126)
Anion gap: 8 (ref 5–15)
BUN: 26 mg/dL — ABNORMAL HIGH (ref 8–23)
CO2: 27 mmol/L (ref 22–32)
Calcium: 9 mg/dL (ref 8.9–10.3)
Chloride: 107 mmol/L (ref 98–111)
Creatinine, Ser: 1.45 mg/dL — ABNORMAL HIGH (ref 0.61–1.24)
GFR, Estimated: 50 mL/min — ABNORMAL LOW (ref >60)
Glucose, Bld: 105 mg/dL — ABNORMAL HIGH (ref 70–99)
Potassium: 4.3 mmol/L (ref 3.5–5.1)
Sodium: 142 mmol/L (ref 135–145)
Total Bilirubin: 1 mg/dL (ref 0.3–1.2)
Total Protein: 5.5 g/dL — ABNORMAL LOW (ref 6.5–8.1)

## 2020-11-16 LAB — CBC
HCT: 40.2 % (ref 39.0–52.0)
Hemoglobin: 12.8 g/dL — ABNORMAL LOW (ref 13.0–17.0)
MCH: 30.1 pg (ref 26.0–34.0)
MCHC: 31.8 g/dL (ref 30.0–36.0)
MCV: 94.6 fL (ref 80.0–100.0)
Platelets: 239 10*3/uL (ref 150–400)
RBC: 4.25 MIL/uL (ref 4.22–5.81)
RDW: 14.3 % (ref 11.5–15.5)
WBC: 10.8 10*3/uL — ABNORMAL HIGH (ref 4.0–10.5)
nRBC: 0 % (ref 0.0–0.2)

## 2020-11-16 LAB — TSH: TSH: 2.618 u[IU]/mL (ref 0.350–4.500)

## 2020-11-16 MED ORDER — CLOPIDOGREL BISULFATE 75 MG PO TABS
75.0000 mg | ORAL_TABLET | Freq: Every day | ORAL | Status: DC
  Administered 2020-11-16 – 2020-11-19 (×4): 75 mg via ORAL
  Filled 2020-11-16 (×4): qty 1

## 2020-11-16 MED ORDER — FUROSEMIDE 40 MG PO TABS
40.0000 mg | ORAL_TABLET | Freq: Every day | ORAL | Status: DC
  Administered 2020-11-17 – 2020-11-19 (×3): 40 mg via ORAL
  Filled 2020-11-16 (×3): qty 1

## 2020-11-16 NOTE — Progress Notes (Signed)
Heart Failure Navigation Team Progress Note  PCP: Pcp, No Primary Cardiologist: Donato Schultz, MD Admitted from: home/van  Past Medical History:  Diagnosis Date  . Acute ST elevation myocardial infarction (STEMI) involving left anterior descending (LAD) coronary artery (HCC) 12/30/2017  . Acute systolic heart failure (HCC) 12/30/2017  . COPD (chronic obstructive pulmonary disease) (HCC)   . Coronary artery disease    a.  presented with CP and inf STE but no ACS - LHC (10/23/13):  Dist LM 40-60%, ostial LAD 80%, mid LAD 50%, ostial CFX 50-70%, mid RCA 50%.  EF 60%. - CP not felt to be ischemic; CABG vs Med Rx d/w pt - pt opted for Med Rx  . History of kidney stones    "more than 20" (01/09/2018)  . Hyperlipidemia   . Hypertension   . Marijuana abuse   . MI (mitral incompetence)   . Tobacco dependence     Social History   Socioeconomic History  . Marital status: Divorced    Spouse name: Not on file  . Number of children: Not on file  . Years of education: Not on file  . Highest education level: Not on file  Occupational History  . Not on file  Tobacco Use  . Smoking status: Former Games developer  . Smokeless tobacco: Never Used  . Tobacco comment: Last smoked 20 years ago  Substance and Sexual Activity  . Alcohol use: Not Currently    Comment: Not in 21 days  . Drug use: Never    Types: Marijuana  . Sexual activity: Not on file  Other Topics Concern  . Not on file  Social History Narrative   ** Merged History Encounter **       Social Determinants of Health   Financial Resource Strain: High Risk  . Difficulty of Paying Living Expenses: Hard  Food Insecurity: Food Insecurity Present  . Worried About Programme researcher, broadcasting/film/video in the Last Year: Sometimes true  . Ran Out of Food in the Last Year: Sometimes true  Transportation Needs: Unmet Transportation Needs  . Lack of Transportation (Medical): No  . Lack of Transportation (Non-Medical): Yes  Physical Activity: Not on file   Stress: Not on file  Social Connections: Not on file     Heart & Vascular Transition of Care Clinic follow-up: Scheduled for Friday 11/26/2020.  Confirmed patient has transportation.  Immediate social needs: Housing, food, finances. Patient is living in his [name] currently and doesn't have a stable living situation as he has to move his [name] around so he can plug in his breathing machine that he needs at night. Patient does have Food Stamps he was getting $250 a month but recently this amount was reduced and he is unsure of the current amount. Mr. Capp reports he does get social security but he can't always afford gas for his vehicle.  Noboru Bidinger, MSW, LCSWA [phone] Heart Failure Social Worker

## 2020-11-16 NOTE — Consult Note (Signed)
Cardiology Consultation:   Patient ID: Ray Smith MRN: [id number]; DOB: [birth year]  Admit date: 11/15/2020 Date of Consult: 11/16/2020  PCP:  Oneita Hurt No   [facility] Providers Cardiologist:  Ray Schultz, MD   {   Patient Profile:   Ray Smith is a 75 y.o. male with a hx of oxygen dependent COPD, chroninc combined heart failure, CAD (PCI with DES to left main /proximal LAD/proximal circumflex 12/2017), noncompliance, PAD s/p angioplasty of right SFA 10/11/2017), CKD stage III, HTN, HLD, who is being seen 11/16/2020 for the evaluation of chest pain and CHF, at the request of Ray. Mcarthur Smith.  History of Present Illness:   Ray Smith follows our team outpatient for CAD intermittently throughout the years, has lost to follow-up from time to time.   He originally underwent LHC on 10/23/2013 for STEMI by Ray Smith, which showed significant distal left main/ostial LAD and moderate ostial circumflex, but it was felt his clinical symptoms were not cardiac origin and his CAD is not severe enough to cause resting angina. He had resolved chest pain despite no intervention. He refused CABG and opted for medical therapy at that time.   He was hospitalized for NSTEMI and underwent LHC by Ray Smith on 12/30/2017 which showed severe distal left main stenosis, acute total occlusion of proximal LAD, severe ostial left circumflex stenosis, moderate mid RCA stenosis.  LVEF was down to 25%. Echo showed EF of 25-30%, akinesis of the anteroseptal, anterior, anterolateral, lateral, inferolateral, and apical myocardium. No LV thrombus noted. He was treated with PCI with DES to left main stent/proximal LAD/proximal circumflex.  He was started on DAPT with aspirin and Plavix for long term use.   He shortly was hospitalized again after that and seen by cardiology on 01/09/2018 for chest pain, found to be noncompliant with Plavix, eventually resumed on DAPT.   He was lost to follow-up since 12/2017.  He had a telemedicine visit  04/07/2019 with cardiology, he reports being compliant with his DAPT, but has not been taking statin or beta-blocker.  He was advised to repeat echocardiogram and start carvedilol, spironolactone, and Lipitor at the time.  He was lost to follow-up again after that.   On 09/03/2019, he was hospitalized here for hypoxic respiratory failure and systolic heart failure.  Echo repeated that time showed EF of 25 to 30%, LV global hypokinesis, moderate LVH, grade 1 DD.  Elevated LVEDP.  Trivial MR.  He was diuresed with IV Lasix.  His blood pressure was not tolerating adding Entresto.  He was discharged on as needed Lasix.  He was advised to follow-up with Ray Smith outpatient, but missed the appointment.    He was last seen by Ray Smith via telemedicine on 04/28/20, requesting medication to be refilled, reports that he is feeling well with some leg cramps and stable DOE, continues to smoke marijuana.   He was recently hospitalized here from 11/02/20-11/03/20 for decompensated heart failure, received IV Lasix and diuresed well.  Echo repeated on 11/03/2020 with stable EF of 25 to 30%, grade 2 DD, mild to moderate aortic stenosis.  Patient return to the ER 11/15/2020 complaining chest pain and shortness of breath.  He reports chest pain is located on the left side of his chest, occurred yesterday when he was laying in bed.  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 Pain was radiating to his left arm, he was diaphoretic, nauseous, had emesis episode, with chills.  Pain was intense and improved somewhat  after taking 5 tablets of sublingual nitroglycerin.    He reports worsened shortness of breath than baseline DOE, worsened orthopnea, and bilateral lower extremity edema.  He has been using albuterol more frequently without adequate relief of respiratory symptoms.   He states he was kicked out of his house last Oct 2021, he has been living in his [facility]. He is concerned of cost of living, he has no money to pay for gas  and therefore could not use oxygen at night. He sues oxygen during the day while driving. He has not been following up with cardiology, states he can't walk far to his appointments. He states he is taking all his medications, but does not know the names of his meds.   Diagnostic work-up at admission showed creatinine 1.4, GFR 52.  CBC differential revealed normocytic anemia 12.5.  BNP elevated 3315.  High sensitive troponin 44>50>53. Flu and COVID-negative.  TSH WNL. Chest x-ray revealed persistent bilateral effusions with atelectasis and/or pneumonia in both lower lobes. EKG reveals sinus rhythm, ventricular rate of 96, PACs, first-degree AV block, old RBBB, old infarcts. No acute changes.  He is afebrile, initially hypertensive upon arrival with blood pressure up to 167/98.  He is admitted to hospital medicine, started on IV Lasix 40 mg twice daily.  He is consulted for further management.     Past Medical History:  Diagnosis Date  . Acute ST elevation myocardial infarction (STEMI) involving left anterior descending (LAD) coronary artery (HCC) 12/30/2017  . Acute systolic heart failure (HCC) 12/30/2017  . COPD (chronic obstructive pulmonary disease) (HCC)   . Coronary artery disease    a.  presented with CP and inf STE but no ACS - LHC (10/23/13):  Dist LM 40-60%, ostial LAD 80%, mid LAD 50%, ostial CFX 50-70%, mid RCA 50%.  EF 60%. - CP not felt to be ischemic; CABG vs Med Rx d/w pt - pt opted for Med Rx  . History of kidney stones    "more than 20" (01/09/2018)  . Hyperlipidemia   . Hypertension   . Marijuana abuse   . MI (mitral incompetence)   . Tobacco dependence     Past Surgical History:  Procedure Laterality Date  . CARDIAC CATHETERIZATION    . CORONARY/GRAFT ACUTE MI REVASCULARIZATION N/A 12/30/2017   Procedure: Coronary/Graft Acute MI Revascularization;  Surgeon: Tonny Bollman, MD;  Location[facility] INVASIVE CV LAB;  Service: Cardiovascular;  Laterality: N/A;  Distal LM into LAD  Xience SIerra 3.5x78mm Ostial Cx Xience [place] 3.5x25mm  . LEFT HEART CATH AND CORONARY ANGIOGRAPHY N/A 12/30/2017   Procedure: LEFT HEART CATH AND CORONARY ANGIOGRAPHY;  Surgeon: Tonny Bollman, MD;  Location: [facility] INVASIVE CV LAB;  Service: Cardiovascular;  Laterality: N/A;  . LEFT HEART CATHETERIZATION WITH CORONARY ANGIOGRAM Bilateral 10/23/2013   Procedure: LEFT HEART CATHETERIZATION WITH CORONARY ANGIOGRAM;  Surgeon: Lesleigh Noe, MD;  Location: [facility] CATH LAB;  Service: Cardiovascular;  Laterality: Bilateral;  . snake bite surgery    . TONSILLECTOMY  1955  . TOTAL KNEE ARTHROPLASTY  2001     Home Medications:  Prior to Admission medications   Medication Sig Start Date End Date Taking? Authorizing Provider  albuterol (PROVENTIL) (5 MG/ML) 0.5% nebulizer solution Take 0.5 mLs (2.5 mg total) by nebulization every 6 (six) hours as needed for wheezing or shortness of breath. 11/03/20  Yes Aslam, Leanna Sato, MD  albuterol (VENTOLIN HFA) 108 (90 Base) MCG/ACT inhaler Inhale 2 puffs into the lungs every 6 (six) hours as needed  for wheezing or shortness of breath. 11/03/20  Yes Aslam, Leanna Sato, MD  aspirin 81 MG EC tablet Take 1 tablet (81 mg total) by mouth daily. 11/03/20  Yes Aslam, Leanna Sato, MD  atorvastatin (LIPITOR) 80 MG tablet Take 1 tablet (80 mg total) by mouth daily at 6 PM. 11/03/20  Yes Aslam, Leanna Sato, MD  carvedilol (COREG) 3.125 MG tablet Take 1 tablet (3.125 mg total) by mouth 2 (two) times daily with a meal. 11/03/20 12/03/20 Yes Aslam, Sadia, MD  clopidogrel (PLAVIX) 75 MG tablet Take 1 tablet (75 mg total) by mouth daily. 11/03/20  Yes Aslam, Leanna Sato, MD  furosemide (LASIX) 40 MG tablet Take 1 tablet (40 mg total) by mouth every other day. If u feel short of breath, you may take daily for 3 days then go back to every other day 11/03/20  Yes Aslam, Sadia, MD  ipratropium (ATROVENT) 0.03 % nasal spray Place 2 sprays into both nostrils every 12 (twelve) hours. 11/03/20  Yes Aslam, Leanna Sato, MD  nitroGLYCERIN  (NITROSTAT) 0.4 MG SL tablet Place 1 tablet (0.4 mg total) under the tongue every 5 (five) minutes x 3 doses as needed for chest pain. 11/03/20  Yes Aslam, Leanna Sato, MD  pantoprazole (PROTONIX) 40 MG tablet TAKE 1 TABLET BY MOUTH EVERYDAY AT BEDTIME Patient taking differently: Take 40 mg by mouth at bedtime. 11/03/20  Yes Aslam, Leanna Sato, MD  Tiotropium Bromide-Olodaterol (STIOLTO RESPIMAT) 2.5-2.5 MCG/ACT AERS INHALE 2 PUFFS BY MOUTH INTO THE LUNGS DAILY Patient taking differently: Inhale 2 puffs into the lungs daily. 11/03/20  Yes Aslam, Leanna Sato, MD  OXYGEN Inhale 5 L/min into the lungs at bedtime as needed (for shortness of breath).    [provider]    Inpatient Medications: Scheduled Meds: . aspirin EC  81 mg Oral Daily  . atorvastatin  80 mg Oral q1800  . carvedilol  6.25 mg Oral BID WC  . clopidogrel  75 mg Oral Daily  . enoxaparin (LOVENOX) injection  40 mg Subcutaneous Q24H  . furosemide  40 mg Intravenous BID  . pantoprazole  40 mg Oral QHS  . sodium chloride flush  3 mL Intravenous Q12H  . umeclidinium bromide  1 puff Inhalation Daily   Continuous Infusions:  PRN Meds: acetaminophen **OR** acetaminophen, ipratropium-albuterol, nitroGLYCERIN, ondansetron **OR** ondansetron (ZOFRAN) IV, senna-docusate  Allergies:    Allergies  Allergen Reactions  . Tramadol Anaphylaxis  . Flexeril [Cyclobenzaprine] Other (See Comments)    Per patient "it made my heart stop"  . Ibuprofen Other (See Comments)    Overuse damages pt's kidneys   . Ibuprofen Nausea Only and Other (See Comments)    Reaction not recalled- perhaps made his stomach hurt  . Lactose Intolerance (Gi) Other (See Comments)    Digestive issues    Social History:   Social History   Socioeconomic History  . Marital status: Divorced    Spouse name: Not on file  . Number of children: Not on file  . Years of education: Not on file  . Highest education level: Not on file  Occupational History  . Not on file   Tobacco Use  . Smoking status: Former Games developer  . Smokeless tobacco: Never Used  . Tobacco comment: Last smoked 20 years ago  Substance and Sexual Activity  . Alcohol use: Not Currently    Comment: Not in 21 days  . Drug use: Never    Types: Marijuana  . Sexual activity: Not on file  Other Topics Concern  . Not on file  Social History Narrative   ** Merged History Encounter **       Social Determinants of Health   Smith Resource Strain: High Risk  . Difficulty of Paying Living Expenses: Hard  Food Insecurity: Food Insecurity Present  . Worried About Programme researcher, broadcasting/film/video in the Last Year: Sometimes true  . Ran Out of Food in the Last Year: Sometimes true  Transportation Needs: Unmet Transportation Needs  . Lack of Transportation (Medical): No  . Lack of Transportation (Non-Medical): Yes  Physical Activity: Not on file  Stress: Not on file  Social Connections: Not on file  Intimate Partner Violence: Not on file    Family History:    Family History  Problem Relation Age of Onset  . Heart disease Mother   . Alzheimer's disease Father   . Multiple sclerosis Sister   . Diabetes Maternal Grandmother   . Drug abuse Other      ROS:  Constitutional: Denied fever, chills, malaise, night sweats Eyes: Denied vision change or loss Ears/Nose/Mouth/Throat: Denied ear ache, sore throat, coughing, sinus pain Cardiovascular: see HPI Respiratory: see HPI  Gastrointestinal: Denied nausea, vomiting, abdominal pain, diarrhea Genital/Urinary: Denied dysuria, hematuria, urinary frequency/urgency Musculoskeletal: Global weakness  Skin: Denied rash, wound Neuro: Denied headache, dizziness, syncope Psych: Denied history of depression/anxiety  Endocrine: Denied history of diabetes     Physical Exam/Data:   Vitals:   11/16/20 0804 11/16/20 0811 11/16/20 0847 11/16/20 1200  BP:   112/74 124/64  Pulse: 69  74 72  Resp: (!) 21     Temp:  97.6 F (36.4 C)  97.8 F (36.6 C)   TempSrc:  Oral  Oral  SpO2: 95% 95%    Weight:        Intake/Output Summary (Last 24 hours) at 11/16/2020 1549 Last data filed at 11/16/2020 1500 Gross per 24 hour  Intake 1740 ml  Output 3425 ml  Net -1685 ml   Last 3 Weights 11/16/2020 11/15/2020 11/03/2020  Weight (lbs) 108 lb 4.8 oz 117 lb 1 oz 109 lb 12.6 oz  Weight (kg) 49.125 kg 53.1 kg 49.8 kg     Body mass index is 15.54 kg/m.   Vitals:  Vitals:   11/16/20 0847 11/16/20 1200  BP: 112/74 124/64  Pulse: 74 72  Resp:    Temp:  97.8 F (36.6 C)  SpO2:     General Appearance: In no apparent distress, sitting in recliner, cachexia  HEENT: Normocephalic, atraumatic.  Neck: Supple, trachea midline, no JVDs Cardiovascular: Regular rate and rhythm, normal S1-S2,  no murmur/rub/gallop Respiratory: Resting breathing unlabored, lungs sounds clear to auscultation bilaterally, no use of accessory muscles. On 5LNC. No wheezes, rales or rhonchi.   Gastrointestinal: Bowel sounds positive, abdomen soft, non-tender, non-distended.  Extremities: Able to move all extremities in bed without difficulty, no edema/cyanosis/clubbing Genitourinary: Genital exam not performed Musculoskeletal: Normal muscle bulk and tone, muscle strength 5/5 throughout, global weakness Skin: Intact, warm, dry. No rashes or petechiae noted in exposed areas.  Neurologic: Alert, oriented to person, place and time. Fluent speech, no cognitive deficit, no gross focal neuro deficit Psychiatric: Normal affect. Mood is appropriate.   EKG:  The EKG was personally reviewed and demonstrates: sinus rhythm, ventricular rate of 96, PACs, first-degree AV block, old RBBB, old infarcts. No acute changes.   Telemetry:  Telemetry was personally reviewed and demonstrates:  Sinus rhythm with PACs   Relevant CV Studies:  Echo from 11/03/2020:  1. Left ventricular ejection fraction, by estimation, is  25 to 30%. The  left ventricle has severely decreased function. The left  ventricle  demonstrates global hypokinesis. The left ventricular internal cavity size  was moderately dilated. Left  ventricular diastolic parameters are consistent with Grade II diastolic  dysfunction (pseudonormalization). Elevated left atrial pressure.  2. Right ventricular systolic function is mildly reduced. The right  ventricular size is normal. Tricuspid regurgitation signal is inadequate  for assessing PA pressure.  3. Left atrial size was moderately dilated.  4. Right atrial size was mildly dilated.  5. The mitral valve is normal in structure. No evidence of mitral valve  regurgitation.  6. The aortic valve was not well visualized. Aortic valve regurgitation  is not visualized. Mild to moderate aortic valve stenosis. Mean gradient  only , but suspect low flow low gradient aortic stenosis, AVA (1.1  cm^2) and DI (0.38) suggest moderate  AS  7. The inferior vena cava is normal in size with greater than 50%  respiratory variability, suggesting right atrial pressure of 3 mmHg.   Echo from 09/03/2019:  1. Left ventricular ejection fraction, by estimation, is 25 to 30%. The  left ventricle has severely decreased function. The left ventricle  demonstrates global hypokinesis. There is moderate left ventricular  hypertrophy. Left ventricular diastolic  parameters are consistent with Grade I diastolic dysfunction (impaired  relaxation). Elevated left ventricular end-diastolic pressure. There is  severe akinesis of the left ventricular, entire apical segment,  anteroseptal wall, anterior wall and distal  inferoapical.  2. Right ventricular systolic function is normal. The right ventricular  size is normal.  3. The mitral valve is grossly normal. Trivial mitral valve  regurgitation.  4. The aortic valve is tricuspid. Aortic valve regurgitation is not  visualized.   Cardiac catheterization 12/30/2017:   Mid RCA lesion is 70% stenosed.  Mid LM to Dist LM lesion is  90% stenosed.  Ost LAD to Prox LAD lesion is 100% stenosed.  Ost Cx to Prox Cx lesion is 90% stenosed.  Prox Cx to Mid Cx lesion is 50% stenosed.  Prox LAD to Mid LAD lesion is 40% stenosed.  Mid LAD lesion is 50% stenosed.  Ost 1st Diag to 1st Diag lesion is 40% stenosed.  A drug-eluting stent was successfully placed using a STENT SIERRA 3.50 X 33 MM.  Post intervention, there is a 0% residual stenosis.  Post intervention, there is a 0% residual stenosis.  A drug-eluting stent was successfully placed using a STENT SIERRA 3.50 X 23 MM.  Post intervention, there is a 0% residual stenosis.  There is severe left ventricular systolic dysfunction.  LV end diastolic pressure is normal.  The left ventricular ejection fraction is 25-35% by visual estimate.   1.  Severe distal left main stenosis 2.  Acute total occlusion of the proximal LAD 3.  Severe ostial left circumflex stenosis 4.  Moderate mid RCA stenosis 5.  Severe segmental LV systolic dysfunction consistent with large anterolateral infarction with LVEF estimated at 25% 6.  Successful complex PCI of the left mainstem/proximal LAD/proximal circumflex as outlined above  Recommend dual antiplatelet therapy with Aspirin 81mg  daily and Clopidogrel 75mg  daily long-term (beyond 12 months) because of complex left main bifurcation PCI in the setting of anterior MI.   Laboratory Data:  High Sensitivity Troponin:   Recent Labs  Lab 11/02/20 0659 11/02/20 0939 11/15/20 1150 11/15/20 1431 11/15/20 1642  TROPONINIHS 44* 40* 44* 50* 53*     Chemistry Recent Labs  Lab 11/15/20 1150 11/16/20 0349  NA  142 142  K 3.6 4.3  CL 109 107  CO2 23 27  GLUCOSE 98 105*  BUN 26* 26*  CREATININE 1.40* 1.45*  CALCIUM 8.5* 9.0  GFRNONAA 52* 50*  ANIONGAP 10 8    Recent Labs  Lab 11/16/20 0349  PROT 5.5*  ALBUMIN 3.0*  AST 16  ALT 14  ALKPHOS 115  BILITOT 1.0   Hematology Recent Labs  Lab 11/15/20 1150 11/16/20 0349   WBC 8.7 10.8*  RBC 4.15* 4.25  HGB 12.5* 12.8*  HCT 39.6 40.2  MCV 95.4 94.6  MCH 30.1 30.1  MCHC 31.6 31.8  RDW 14.6 14.3  PLT 250 239   BNP Recent Labs  Lab 11/15/20 1150  BNP 3,315.3*    DDimer No results for input(s): DDIMER in the last 168 hours.   Radiology/Studies:  DG Chest Portable 1 View  Result Date: 11/15/2020 CLINICAL DATA:  Shortness of breath and chest pain EXAM: PORTABLE CHEST 1 VIEW COMPARISON:  11/04/2019 FINDINGS: Artifact overlies the chest. Cardiomegaly and aortic atherosclerosis as seen previously. Bilateral effusions persist, with associated atelectasis and or infiltrate at both lung bases. Upper lungs remain clear. IMPRESSION: Persistent bilateral effusions with atelectasis and or pneumonia in both lower lobes. Cardiomegaly and aortic atherosclerosis as seen previously. Electronically Signed   By: Paulina Fusi M.D.   On: 11/15/2020 12:43     Assessment and Plan:   Acute on chronic systolic and diastolic heart failure Ischemic cardiomyopathy  -Presented with gradual worsening chest pain, shortness of breath, leg edema, orthopnea -BNP 3315 (from 2596 on 11/02/20) -CXR revealed persistent bilateral effusion with atelectasis, appears consistent CHF -Echo from 11/03/20 with EF 25 to 30%, LV global hypokinesis, grade II DD, elevated LA pressure, RV systolic function mildly reduced, moderately dilated LA, mildly dilated RA, mild to moderate aortic stenosis with mean gradient 35mm Hg and AVA 1.1cm2 -Has been started on Lasix 40 mg twice daily, on Lasix 40mg  every other day at home  -Net -2435 ml , weight down from 117 to 108 pounds since admission -continue IV lasix 40mg  BID  -Monitor strict intake and output, daily weight, daily renal index and electrolytes, fluid restriction <1.5L daily, Low Na diet  - continue GDMT with coreg, historically unable add Entresto due to hypotension, historically noncompliant with medical therapy recommendations and appointments,   consider add spironolactone  if renal index tolerating IV lasix   Chest pain Elevated troponin CAD with history of PCI in 2019 - Hs troponin 44>50 >53 - EKG without acute ischemic change - Recommend ischemic workup with Myoview, if large defect, may consider cardiac cath (will be high risk given CKD and non-compliance), if negative stress test would continue optimize medical therapy  - Continue medical therapy with long term DAPT due to complex left main bifurcation PCI in the setting of anterior MI; Coreg (dose increased for HTN/PACs, Ok); and Lipitor   Hypertension -Initially hypotensive at ER, blood pressure improved now, Coreg was increased to 6.25 mg twice daily by internal medicine, continue Lasix, consider add low-dose spironolactone before DC, trend BP  Hyperlipidemia -No recent lipid profile, will repeat in a.m. -Continue Lipitor 80 mg  Oxygen dependent COPD -No evidence of acute exacerbation, management per hospitalist, patient reports he can't afford use oxygen at night in his , homeless  PAD -History of angioplasty of right SFA 10/11/2017 at [facility] -Continue DAPT and statin  CKD stage III -renal index is near baseline - continue monitor daily BMP while diuresis  Risk Assessment/Risk Scores:  { HEAR Score (for undifferentiated chest pain):  HEAR Score: 6  New York Heart Association (NYHA) Functional Class NYHA Class III       For questions or updates, please contact [facility] Please consult [email or web address] for contact info under    Signed, Cyndi BenderXika Samhita Kretsch, NP  11/16/2020 3:49 PM

## 2020-11-16 NOTE — Progress Notes (Signed)
Heart Failure Stewardship Pharmacist Progress Note   PCP: Pcp, No PCP-Cardiologist: Donato Schultz, MD    HPI:  75 yo male with PMH COPD on 5L at night, CAD s/p PCI to LM in 2019, PVD s/p angioplasty in 2019, HTN, HLD, CKD2, and systolic HF. Recently admitted and found to have new diagnosis of CHF with ECHO on 11/03/20 with LVEF 25-30% and RV function mildly reduced. Was discharged 11/03/20 with carvedilol and furosemide 40 mg QOD. He presents 2 weeks later with gradual onset CP and SOB. BNP elevated on admit with bilateral LEE unable to put on shoes recently.  Current HF Medications: IV furosemide 40 mg BID Carvedilol 6.25 mg BID  Prior to admission HF Medications: PO furosemide 40 mg QOD Carvedilol 3.125 mg BID  Pertinent Lab Values: . Serum creatinine 1.45 (BL 1.1-1.2), BUN 26, Potassium 4.3, Sodium 142, BNP 3315, Magnesium 2.2   Vital Signs: . Weight: 108 lbs (admission weight: 117 lbs) . Blood pressure: 110-120/70s  . Heart rate: 70s   Medication Assistance / Insurance Benefits Check: Does the patient have prescription insurance?  Yes Type of insurance plan: [employer] Medicare,  Medicaid  Outpatient Pharmacy:  Prior to admission outpatient pharmacy: [facility] Is the patient willing to use [facility] TOC pharmacy at discharge? Yes Is the patient willing to transition their outpatient pharmacy to utilize a [facility] outpatient pharmacy?   Pending    Assessment: 1. Acute on chronic systolic CHF (EF 40-10%), due to ICM. NYHA class II-III symptoms. - Volume status improving and breathing improved per patient. Weight down 9 lbs with adequate UOP (2.4L) and response to diuretics - continue IV furosemide 40 mg BID - Continue carvedilol 6.25 mg BID. Do not increase dose further until euvolemic. - Consider starting Entresto 24/26 mg BID - Consider adding spironolactone and Farxiga 10 mg daily prior to discharge   Plan: 1) Medication changes recommended at this time: - Recommend adding  Entresto 24/26 mg BID. Awaiting MD response.  2) Patient assistance: - Sherryll Burger copay $4/month - Marcelline Deist copay $4/month  3)  Education  - To be completed prior to discharge  Tama Headings, PharmD, BCPS Sharen Hones, PharmD, BCPS Heart Failure Stewardship Pharmacist Phone [phone]

## 2020-11-16 NOTE — Progress Notes (Signed)
18 bts Vtach noted, Pt has no complains. VS as follows, BP 121/79   Pulse 78   Temp 97.8 F (36.6 C) (Oral)   Resp (!) 21   Wt 49.1 kg   SpO2 95%   BMI 15.54 kg/m    -Internal med notified.

## 2020-11-16 NOTE — Progress Notes (Signed)
Date: 11/16/2020  Patient name: Ray Smith  Medical record number: [id number]  Date of birth: Nov 23, 1945   I have seen and evaluated Ray Smith and discussed their care with the Residency Team.  In brief, patient is a 75 year old male with a past medical history of COPD, chronic systolic heart failure with an EF of 25 to 30%, CAD status post PCI to left main in 2019, peripheral vascular disease status post angioplasty, CKD stage II, hypertension hyperlipidemia who presented to the ED with progressively worsening shortness of breath and chest pain x1 episode.  Patient was recently admitted to the hospital from May 10 to May 11 for acute hypoxic respiratory failure secondary to acute on chronic systolic heart failure exacerbation.  Patient received IV diuresis at that time and felt better the next day and left.  Patient states that since he left he has noted progressively worsening shortness of breath as well as increased lower extremity swelling.  On the night prior to his admission he noted left-sided chest pain which radiated down his left arm and is associated with diaphoresis, nausea/vomiting and chills which is similar to his prior heart attack.  Patient also notes that he uses 5 L O2 at night and as needed at home.  No palpitations, no diaphoresis, no headache, no blurry vision, no tingling or numbness, no diarrhea, no abdominal pain.  Today, patient states that her shortness of breath is improving and that his chest pain has resolved.  He still complains of some soreness in his left arm.  PMHx, Fam Hx, and/or Soc Hx : As per resident admit note  Vitals:   11/16/20 0847 11/16/20 1200  BP: 112/74 124/64  Pulse: 74 72  Resp:    Temp:  97.8 F (36.6 C)  SpO2:     General: Awake, alert, oriented x3, NAD CVS: Regular rate and rhythm, normal heart sounds Lungs: Decreased breath sounds at bilateral bases Abdomen: Soft, nontender, nondistended normoactive bowel sounds Extremities: 1+  bilateral lower extremity pitting edema noted, nontender to palpation Psych: Normal mood and affect HEENT: Normocephalic, atraumatic Neuro: Oriented x3, able to move all 4 extremities  Assessment and Plan: I have seen and evaluated the patient as outlined above. I agree with the formulated Assessment and Plan as detailed in the residents' note, with the following changes:   1.  Acute on chronic systolic heart failure exacerbation: -Patient presented to ED with progressive shortness of breath and dyspnea on exertion since his recent discharge from the hospital and was noted to have an elevated BNP up to 3000 as well as a chest x-ray consistent with heart failure exacerbation. -Patient was started on IV Lasix 40 mg twice daily and is net negative approximately 2.4 L over the last 24 hours -We will continue with 40 mg Lasix twice daily for now -Continue strict I's and O's and daily weights -We will continue with carvedilol for now -Patient blood pressures remain borderline and unable to add Entresto to his current medication list.  He may benefit from adding an SGLT2 inhibitor on discharge -Continue with O2 via nasal cannula as needed  2.  Chest pain: -Patient had an episode of chest pain similar to his prior NSTEMI.  He had a mildly elevated troponin here which is likely demand ischemia in the setting of heart failure exacerbation.  However, patient does have a history of CAD requiring stent placement and has a high pretest probability of recurrent blockages.  He would benefit from a Myoview versus  cardiac cath to risk stratify him -Continue with dual antiplatelet therapy, beta-blocker and Lipitor -Cardiology consulted.  We will follow up recommendations  Earl Lagos, MD 5/24/20223:55 PM

## 2020-11-16 NOTE — Progress Notes (Signed)
Subjective: Nurses noted18 beats of vtach overnight, patient was asymptomic with vital signs stable at that time. No acute events otherwise.   [name] was seen in his recliner at bedside. He reports feeling better this morning with improvement in his breathing and resolution of chest pain. He continues to utilize supplemental O2. He notes soreness in his left arm today in the same distribution as the L arm pain yesterday. Had heart palpitations yesterday, but none since then. He urinated several times overnight, feels the swelling in his legs is about the same. Notes positional cramping in bilateral calves that has been ongoing for the past two weeks. He says that he left the hospital too early last time, regretted leaving when his breathing worsened a few hours after he got home. Discussed cardiology consult and plan for continued diuresis.   Objective:  Vital signs in last 24 hours: Vitals:   11/16/20 0804 11/16/20 0811 11/16/20 0847 11/16/20 1200  BP:   112/74 124/64  Pulse: 69  74 72  Resp: (!) 21     Temp:  97.6 F (36.4 C)  97.8 F (36.6 C)  TempSrc:  Oral  Oral  SpO2: 95% 95%    Weight:       Weight change:   Intake/Output Summary (Last 24 hours) at 11/16/2020 1501 Last data filed at 11/16/2020 1317 Gross per 24 hour  Intake 1740 ml  Output 3225 ml  Net -1485 ml   CBC Latest Ref Rng & Units 11/16/2020 11/15/2020 11/02/2020  WBC 4.0 - 10.5 K/uL 10.8(H) 8.7 9.4  Hemoglobin 13.0 - 17.0 g/dL 12.8(L) 12.5(L) 12.7(L)  Hematocrit 39.0 - 52.0 % 40.2 39.6 40.5  Platelets 150 - 400 K/uL 239 250 204   CMP Latest Ref Rng & Units 11/16/2020 11/15/2020 11/03/2020  Glucose 70 - 99 mg/dL 101(B) 98 510(C)  BUN 8 - 23 mg/dL 58(N) 27(P) 82(U)  Creatinine 0.61 - 1.24 mg/dL 2.35(T) 6.14(E) 3.15(Q)  Sodium 135 - 145 mmol/L 142 142 139  Potassium 3.5 - 5.1 mmol/L 4.3 3.6 4.0  Chloride 98 - 111 mmol/L 107 109 103  CO2 22 - 32 mmol/L 27 23 27   Calcium 8.9 - 10.3 mg/dL 9.0 ) 0.0(Q)  Total  Protein 6.5 - 8.1 g/dL 6.7(Y) - 5.3(L)  Total Bilirubin 0.3 - 1.2 mg/dL 1.0 - 1.0  Alkaline Phos 38 - 126 U/L 115 - 133(H)  AST 15 - 41 U/L 16 - 20  ALT 0 - 44 U/L 14 - 18    Ref Range & Units 1 d ago  (11/15/20) 1 d ago  (11/15/20) 1 d ago  (11/15/20) 2 wk ago  (11/02/20) 2 wk ago  (11/02/20)  Troponin I  <18 ng/L 53High  50High  44High  40High  44High    Ref Range & Units 1 d ago  (11/15/20)  Magnesium 1.7 - 2.4 mg/dL 2.2     Ref Range & Units 11/16/20  TSH 0.350 - 4.500 uIU/mL 2.618    Physical Exam:  General: Cachexic, frail-appearing gentleman sitting in recliner next to bed in no acute distress. Appears older than stated age. HENT: Normocephalic, atraumatic. Hearing is impaired bilaterally. Wearing corrective lenses CV: Distant heart sounds. S1/S2 present. Regular rate. Slight BL LE edema, improved since yesterday. Lower extremities are cool to the touch but distal pulses are present bilaterally.  Pulm: Breath sounds diminished throughout. Fine crackles at bilateral lung bases. No increased work of breathing on 5L nasal cannula. Able to converse without stopping for  breath.  Abd: Nontender, no guarding or rebound.  Derm: Visible skin is dry, wrinkled, intact without lesions or rashes.  Neuro: Alert and grossly oriented. Speech and cognition grossly intact. No gross neurologic deficits Psych: Thought process logical, linear, and goal-directed.  Assessment/Plan:  Principal Problem:   Acute exacerbation of CHF (congestive heart failure) [facility])  Mr. Worley is a 75 year old gentleman with history of CHF with EF 25-30%, COPD on 5L O2 intermittently at home, CAD s/p complex PCI to left main in 2019, PVD s/p angioplasty in 2019, CKD stage IIIa, hypertension and hyperlipidemia admitted for heart failure exacerbation.   #Acute exacerbation of diastolic and systolic CHF Symptomatic improvement in chest pain and dyspnea, although fine crackles on BL lung bases on exam. Down net  1.5L with reduction in BLE edema.  - IV Lasix 40mg  BID - Continue cardiac monitoring  - Strict I&O monitoring with daily weights - Restrict po liquid intake to 1.8L daily  - TSH and magnesium wnl  - Monitor renal function and replete electrolytes for K >4, Mag >2  #Chronic respiratory failure 2/2 COPD on 5L nasal cannula #Pulmonary cachexia Breathing more comfortably today.  - Supplemental O2 with 5L nasal cannula - Continue home Duoneb - Incruse 1 puff daily   #Hypertension Blood pressure now normalized with borderline-low diastolic pressure. May not tolerate addition of ARB at this time, will defer to cardiology consult for recommendations.  - Coreg 6.25mg  BID   #History CVA s/p complex PCI in 2019 #Elevated troponin Chest pain has resolved. Long-term dual antiplatelet therapy recommended due to complex PCI per 2019 procedure note.  - Inpatient cardiology consult pending for evaluation of angina given new onset chest pain.  - Plavix 75mg  daily - ASA 82mg  daily - Lipitor 80mg  daily - Nitrostat 0.4mg  q8min x3 prn   #CKD IIIa Serum creatinine 1.45 today near historical baseline 1.3-4.  - Continue to monitor metabolic panel - Avoid nephrotoxic agents as able and renally dose   Code status: FULL Diet: Restric sodium to 2mg , Fluids restricted to  DVT Ppx: Lovenox 40mg  Fluids: None   LOS: 0 days   , Medical Student 11/16/2020, 3:01 PM

## 2020-11-17 ENCOUNTER — Inpatient Hospital Stay (HOSPITAL_COMMUNITY): Payer: Medicare HMO

## 2020-11-17 DIAGNOSIS — I5023 Acute on chronic systolic (congestive) heart failure: Secondary | ICD-10-CM | POA: Diagnosis not present

## 2020-11-17 LAB — BASIC METABOLIC PANEL
Anion gap: 8 (ref 5–15)
BUN: 33 mg/dL — ABNORMAL HIGH (ref 8–23)
CO2: 27 mmol/L (ref 22–32)
Calcium: 8.8 mg/dL — ABNORMAL LOW (ref 8.9–10.3)
Chloride: 104 mmol/L (ref 98–111)
Creatinine, Ser: 1.37 mg/dL — ABNORMAL HIGH (ref 0.61–1.24)
GFR, Estimated: 53 mL/min — ABNORMAL LOW (ref >60)
Glucose, Bld: 89 mg/dL (ref 70–99)
Potassium: 4.1 mmol/L (ref 3.5–5.1)
Sodium: 139 mmol/L (ref 135–145)

## 2020-11-17 LAB — MAGNESIUM: Magnesium: 1.9 mg/dL (ref 1.7–2.4)

## 2020-11-17 MED ORDER — TECHNETIUM TC 99M TETROFOSMIN IV KIT
10.0000 | PACK | Freq: Once | INTRAVENOUS | Status: AC | PRN
  Administered 2020-11-17: 10 via INTRAVENOUS

## 2020-11-17 MED ORDER — SALINE SPRAY 0.65 % NA SOLN
1.0000 | NASAL | Status: DC | PRN
  Filled 2020-11-17: qty 44

## 2020-11-17 NOTE — Progress Notes (Signed)
PT Cancellation Note  Patient Details Name: Ray Smith MRN: [id number] DOB: Feb 05, 1946   Cancelled Treatment:    Reason Eval/Treat Not Completed: Fatigue/lethargy limiting ability to participate. Patient sleeping soundly in recliner. Will re-attempt tomorrow.    [name] 11/17/2020, 3:48 PM

## 2020-11-17 NOTE — Plan of Care (Signed)
  Problem: Clinical Measurements: Goal: Respiratory complications will improve Outcome: Progressing   Problem: Activity: Goal: Risk for activity intolerance will decrease Outcome: Progressing   Problem: Safety: Goal: Ability to remain free from injury will improve Outcome: Progressing   

## 2020-11-17 NOTE — TOC Initial Note (Signed)
Transition of Care [facility]) - Initial/Assessment Note    Patient Details  Name: NICKI GRACY MRN: [id number] Date of Birth: 03-Aug-1945  [facility]) CM/SW Contact:    Gala Lewandowsky, RN Phone Number: 11/17/2020, 4:31 PM  Clinical Narrative:  Risk for readmission assessment completed. Case Manager spoke with patient regarding disposition needs. Patient states he lives in his [name]. Patient states he is supposed to wear oxygen during the day; however, he states he has nowhere to plug in for electricity. Patient states sometimes he can go to a friends and plug in during the day. Patient gets his mail at the [facility] [employer] where a friend works. Patient states he gets food stamps $50.00 a month and $800.00 a month in social security. Patient states after his monthly bills he has about $500.00 left. Case Manager stated that the patient may be able to find an extended stay hotel that he can pay week to week as an option. Case Manager reached out to [facility] and she has seen the patient.The HF team will follow the patient outpatient. Case Manager called [employer] to discuss the 02 barrier. Awaiting to hear back from [employer]. Case Manager will continue to follow for additional transition of care needs.                      Expected Discharge Plan: Home/Self Care Barriers to Discharge: Continued Medical Work up   Patient Goals and CMS Choice Patient states their goals for this hospitalization and ongoing recovery are:: living in [place]      Expected Discharge Plan and Services Expected Discharge Plan: Home/Self Care In-house Referral: Clinical Social Work Discharge Planning Services: CM Consult Post Acute Care Choice: NA Living arrangements for the past 2 months: No permanent address (lives in [place]- friend has his dog while in hospital)                 DME Arranged: N/A DME Agency: NA  Prior Living Arrangements/Services Living arrangements for the past 2 months: No  permanent address (lives in [place]- friend has his dog while in hospital) Lives with:: Self Patient language and need for interpreter reviewed:: Yes Do you feel safe going back to the place where you live?:  (Patient states he needs a location to plug in his oxygen.)      Need for Family Participation in Patient Care: No (Comment) Care giver support system in place?: No (comment)   Criminal Activity/Legal Involvement Pertinent to Current Situation/Hospitalization: No - Comment as needed   Permission Sought/Granted Permission sought to share information with : Case Manager Permission granted to share information with : Yes, Verbal Permission Granted       Emotional Assessment   Attitude/Demeanor/Rapport: Engaged Affect (typically observed): Appropriate Orientation: : Oriented to Self,Oriented to Place,Oriented to  Time,Oriented to Situation Alcohol / Substance Use: Not Applicable Psych Involvement: No (comment)  Admission diagnosis:  SOB (shortness of breath) [R06.02] Acute exacerbation of CHF (congestive heart failure) (HCC) [I50.9] Elevated brain natriuretic peptide (BNP) level [R79.89] Other hypervolemia [E87.79] Chest pain, unspecified type [R07.9] Patient Active Problem List   Diagnosis Date Noted  . Acute decompensated heart failure (HCC) 11/02/2020  . PVD (peripheral vascular disease) (HCC) 11/02/2020  . COPD with acute exacerbation (HCC) 09/03/2019  . Acute respiratory failure (HCC) 09/03/2019  . Acute exacerbation of CHF (congestive heart failure) (HCC) 09/02/2019  . Adjustment disorder with mixed disturbance of emotions and conduct   . Protein-calorie  malnutrition, severe 01/10/2018  . Orthostatic hypotension 01/09/2018  . Weakness 01/09/2018  . Mouth pain 01/09/2018  . CKD (chronic kidney disease) stage 2, GFR 60-89 ml/min 01/09/2018  . Marijuana abuse 01/09/2018  . Acute systolic heart failure (HCC) 01/02/2018  . STEMI (ST elevation myocardial infarction) (HCC)  12/30/2017  . STEMI involving left anterior descending coronary artery (HCC) 12/30/2017  . Acute encephalopathy 10/06/2017  . AKI (acute kidney injury) (HCC)   . Altered mental status   . Hypertension 11/12/2013  . Dyslipidemia 10/24/2013  . ACS (acute coronary syndrome) (HCC) 10/23/2013  . Chest pain 10/23/2013  . Coronary Artery Disease 10/23/2013  . COPD (chronic obstructive pulmonary disease) (HCC) 10/23/2013  . Tobacco abuse 10/23/2013  . Precordial pain 10/23/2013   PCP:  Pcp, No Pharmacy:   [facility] [phone] Ginette Otto, [place] - [address] AT [facility] [address] [place] [address] Phone: [phone] Fax: [phone]  [facility] [address] [place] [address] Phone: [phone] Fax: [phone]  Social Determinants of Health (SDOH) Interventions Food Insecurity Interventions: Other (Comment) (Patient reports having food stamps but they decreased his monthly amound it was $250 but he isn't sure what it is now.) Financial Strain Interventions: Other (Comment) (Referral to [facility] outpatient clinic to meet with outpatient CSW) Housing Interventions: Other (Comment) (Patient reports he lives in his [name] and needs to plug in his breathing machine at night and can't afford gas to keep his vehicle running constantly.) Transportation Interventions: Other Orthoptist (Patient is living in his [name] and driving it but can't always afford gas.)  Readmission Risk Interventions Readmission Risk Prevention Plan 11/17/2020  Transportation Screening Complete  PCP or Specialist Appt within 3-5 Days Complete  HRI or Home Care Consult Complete  Palliative Care Screening Not Applicable  Some recent data might be hidden

## 2020-11-17 NOTE — Progress Notes (Addendum)
Subjective:  No acute events overnight.   This morning, Ray Smith was sitting comfortably in the recliner. He reports feeling better today with no further chest pain. His left arm soreness is unchanged today. He reports breathing comfortably with supplemental oxygen. He is not always able to use oxygen in his [name] because he has nowhere to plug in the machine. No change in his leg swelling, notes this has been about the same for the past 2-3 weeks. He would like to walk around his room today. Discussed upcoming nuclear stress test per cardiology recommendations, he has no questions.   Objective:  Vital signs in last 24 hours: Vitals:   11/16/20 2350 11/17/20 0003 11/17/20 0800 11/17/20 0851  BP: 110/89  115/80   Pulse: 74  71   Resp: 18  18   Temp: 98.2 F (36.8 C)     TempSrc: Oral     SpO2: 99%  99% 98%  Weight:  50.7 kg     Weight change: -2.4 kg  Intake/Output Summary (Last 24 hours) at 11/17/2020 1236 Last data filed at 11/17/2020 0853 Gross per 24 hour  Intake 480 ml  Output 1625 ml  Net -1145 ml   BMP Latest Ref Rng & Units 11/17/2020 11/16/2020 11/15/2020  Glucose 70 - 99 mg/dL 89 297(L) 98  BUN 8 - 23 mg/dL 89(Q) 11(H) 41(D)  Creatinine 0.61 - 1.24 mg/dL 4.08(X) 4.48(J) 8.56(D)  Sodium 135 - 145 mmol/L 139 142 142  Potassium 3.5 - 5.1 mmol/L 4.1 4.3 3.6  Chloride 98 - 111 mmol/L 104 107 109  CO2 22 - 32 mmol/L 27 27 23   Calcium 8.9 - 10.3 mg/dL ) 9.0 1.4(H)   Component Ref Range & Units 02:05  (11/17/20) 2 d ago  (11/15/20)  Magnesium 1.7 - 2.4 mg/dL 1.9  2.2 CM    Physical Exam: General: Frail-appearing, cachexic older gentleman sitting in bedside recliner in no acute distress.  HENT: Normocephalic, atraumatic. Moist mucus membranes. Hearing grossly impaired bilaterally. Wearing corrective lenses CV: Distant heart sounds. Regular rate and rhythm. S1/S2 present. No murmurs, rubs, or gallops appreciated. BLE cool to touch but with palpable pulses. Trace BLE  edema to mid-shin.  Pulm: Decreased breath sounds at bilateral bases. No wheezes or crackles. Breathing comfortably on 5L nasal cannula.  Abd: Nontender, nondistended. Normoactive bowel sounds. Derm: Skin dry, intact. No rashes or lesions on visible skin.  Neuro: Alert and grossly oriented. No gross focal neurologic deficits. Speech and cognition grossly intact.  Psych: Thought process is logical, linear, and goal-directed.   Assessment/Plan:  Principal Problem:   Acute exacerbation of CHF (congestive heart failure) (HCC)  Active Problems:  COPD  Hx CAD  Housing instability  Ray Smith is a 75 year old gentleman with a history of systolic heart failure with EF 25-30%, COPD with oxygen requirement of 5L,and CAD s/p PCI to left main in 2019 admitted for acute CHF exacerbation with chest pain.   #Acute on chronic systolic heart failure exacerbation  Patient presented with progressive dyspnea on exertion, BNP >3000, and chest X-ray consistent with CHF exacerbation. His volume status is improving with Lasix with 1.6L of urine output in past 24 hours and weight down total of 2.4kg since admission. He is gradually improving symptomatically although continues to have BLE edema on exam, will continue to benefit from diuresis.  - Continue Lasix 40mg  po daily - Continue cardiac monitoring - Continue strict I/O monitoring and daily weights - Restrict oral fluid intake to 1.5L/day  -  Continue supplemental O2 via nasal cannula as needed  - Pressures have been borderline low, will not add ARB or Entresto at this time - Continue Coreg 6.25mg  BID for now  #Chest pain  #Hx of CAD s/p PCI left main in 2019 Symptoms of L chest pain radiating into L arm have resolved, continues to have L arm soreness. Cardiology was consulted 5/24 given complex cardiac history of NSTEMI and PCI of L main in 2019, hypertension, and CHF.  - Per cardiology: recommended consideration of low-dose spironolactone as he has not  tolerated Entresto in the past due to hypotension  - Nuclear stress test today, awaiting f/u cardiology recommendations  - Continue ASA 81mg , Plavix 75mg  daily, Lipitor 80mg   #COPD with 5L oxygen requirement  #Housing instability Patient is breathing comfortably while on 5L nasal cannula. However, he is unhomed and living in a [place] with inconsistent access to power source for oxygen machine.  - Social work aware, will help with safe discharge planning - Continue daily Incruse  Code Status: FULL IVF: none Diet: 2g sodium restriction and 1.5L fluid restriction  VTE ppx: Lovenox   LOS: 1 day   , Medical Student 11/17/2020, 12:36 PM

## 2020-11-17 NOTE — Progress Notes (Signed)
Pt. Requesting nasal spray to help him breathe better. On call for IMTS paged to make aware.

## 2020-11-17 NOTE — Progress Notes (Signed)
Heart Failure Stewardship Pharmacist Progress Note   PCP: Pcp, No PCP-Cardiologist: Donato Schultz, MD    HPI:  75 yo male with PMH COPD on 5L at night, CAD s/p PCI to LM in 2019, PVD s/p angioplasty in 2019, HTN, HLD, CKD2, and systolic HF. Recently admitted and found to have new diagnosis of CHF with ECHO on 11/03/20 with LVEF 25-30% and RV function mildly reduced. Was discharged 11/03/20 with carvedilol and furosemide 40 mg QOD. He presents 2 weeks later with gradual onset CP and SOB. BNP elevated on admit with bilateral LEE unable to put on shoes recently. For cardiac stress test tomorrow.  Current HF Medications: IV furosemide 40 mg BID Carvedilol 6.25 mg BID  Prior to admission HF Medications: PO furosemide 40 mg QOD Carvedilol 3.125 mg BID  Pertinent Lab Values: . Serum creatinine 1.37 (BL 1.1-1.2), BUN 33, Potassium 4.1, Sodium 139, BNP 3315, Magnesium 1.9  Vital Signs: . Weight: 111 lbs (admission weight: 117 lbs) . Blood pressure: 110/80s  . Heart rate: 70s   Medication Assistance / Insurance Benefits Check: Does the patient have prescription insurance?  Yes Type of insurance plan: [employer] Medicare, [place] Medicaid  Outpatient Pharmacy:  Prior to admission outpatient pharmacy: [facility] Is the patient willing to use [facility] TOC pharmacy at discharge? Yes Is the patient willing to transition their outpatient pharmacy to utilize a [facility] outpatient pharmacy?   Pending    Assessment: 1. Acute on chronic systolic CHF (EF 10-93%), due to ICM. NYHA class II-III symptoms. - Continues to have adequate response to diuretics (1.6L uop/24 hrs) but weight up 3 lbs from yesterday - continue IV furosemide 40 mg BID - Continue carvedilol 6.25 mg BID. Do not increase dose further until euvolemic. - Historically has not tolerated Entresto in the past but BP remains stable while inpatient. Consider starting Entresto 24/26 mg BID prior to discharge. If BP limits ARNI, consider adding low-dose  ARB. - Consider adding spironolactone and Farxiga 10 mg daily prior to discharge   Plan: 1) Medication changes recommended at this time: - Recommended adding Farxiga 10 mg daily. Plan to reassess once Dr. Cristal Deer returns tomorrow.  2) Patient assistance: - Sherryll Burger copay $4/month - Marcelline Deist copay $4/month  3)  Education  - To be completed prior to discharge  Tama Headings, PharmD, BCPS Sharen Hones, PharmD, BCPS Heart Failure Stewardship Pharmacist Phone [phone]

## 2020-11-17 NOTE — Plan of Care (Signed)
?  Problem: Clinical Measurements: ?Goal: Respiratory complications will improve ?Outcome: Progressing ?  ?Problem: Elimination: ?Goal: Will not experience complications related to urinary retention ?Outcome: Progressing ?  ?

## 2020-11-17 NOTE — Progress Notes (Signed)
Unable to complete stress test due to machine is down. Plan for stress test tomorrow. Only resting imaging done today.

## 2020-11-17 NOTE — Progress Notes (Signed)
Brief cardiology progress note: Patient down in nuclear medicine, undergoing nuclear stress test. Awaiting results, will have further recommendations once test is read.  Jodelle Red, MD, PhD, [facility]  [facility]  [address] [place], [place] [address] [phone]

## 2020-11-18 DIAGNOSIS — I25118 Atherosclerotic heart disease of native coronary artery with other forms of angina pectoris: Secondary | ICD-10-CM | POA: Diagnosis not present

## 2020-11-18 DIAGNOSIS — R079 Chest pain, unspecified: Secondary | ICD-10-CM

## 2020-11-18 DIAGNOSIS — I5043 Acute on chronic combined systolic (congestive) and diastolic (congestive) heart failure: Secondary | ICD-10-CM | POA: Diagnosis not present

## 2020-11-18 DIAGNOSIS — I255 Ischemic cardiomyopathy: Secondary | ICD-10-CM

## 2020-11-18 DIAGNOSIS — I5023 Acute on chronic systolic (congestive) heart failure: Secondary | ICD-10-CM | POA: Diagnosis not present

## 2020-11-18 LAB — BASIC METABOLIC PANEL
Anion gap: 7 (ref 5–15)
BUN: 34 mg/dL — ABNORMAL HIGH (ref 8–23)
CO2: 30 mmol/L (ref 22–32)
Calcium: 8.7 mg/dL — ABNORMAL LOW (ref 8.9–10.3)
Chloride: 102 mmol/L (ref 98–111)
Creatinine, Ser: 1.34 mg/dL — ABNORMAL HIGH (ref 0.61–1.24)
GFR, Estimated: 55 mL/min — ABNORMAL LOW (ref >60)
Glucose, Bld: 98 mg/dL (ref 70–99)
Potassium: 4.2 mmol/L (ref 3.5–5.1)
Sodium: 139 mmol/L (ref 135–145)

## 2020-11-18 LAB — NM MYOCAR MULTI W/SPECT W/WALL MOTION / EF
MPHR: 144 {beats}/min
Peak HR: 100 {beats}/min
Percent HR: 69 %
Rest HR: 76 {beats}/min

## 2020-11-18 LAB — CBC
HCT: 36.8 % — ABNORMAL LOW (ref 39.0–52.0)
Hemoglobin: 11.7 g/dL — ABNORMAL LOW (ref 13.0–17.0)
MCH: 30.5 pg (ref 26.0–34.0)
MCHC: 31.8 g/dL (ref 30.0–36.0)
MCV: 95.8 fL (ref 80.0–100.0)
Platelets: 203 10*3/uL (ref 150–400)
RBC: 3.84 MIL/uL — ABNORMAL LOW (ref 4.22–5.81)
RDW: 14.3 % (ref 11.5–15.5)
WBC: 9.6 10*3/uL (ref 4.0–10.5)
nRBC: 0 % (ref 0.0–0.2)

## 2020-11-18 LAB — MAGNESIUM: Magnesium: 2 mg/dL (ref 1.7–2.4)

## 2020-11-18 MED ORDER — REGADENOSON 0.4 MG/5ML IV SOLN
0.4000 mg | Freq: Once | INTRAVENOUS | Status: AC
  Administered 2020-11-18: 0.4 mg via INTRAVENOUS
  Filled 2020-11-18: qty 5

## 2020-11-18 MED ORDER — REGADENOSON 0.4 MG/5ML IV SOLN
INTRAVENOUS | Status: AC
  Filled 2020-11-18: qty 5

## 2020-11-18 MED ORDER — TECHNETIUM TC 99M TETROFOSMIN IV KIT
32.4000 | PACK | Freq: Once | INTRAVENOUS | Status: AC | PRN
  Administered 2020-11-18: 32.4 via INTRAVENOUS

## 2020-11-18 MED ORDER — DAPAGLIFLOZIN PROPANEDIOL 10 MG PO TABS
10.0000 mg | ORAL_TABLET | Freq: Every day | ORAL | Status: DC
  Administered 2020-11-18 – 2020-11-19 (×2): 10 mg via ORAL
  Filled 2020-11-18 (×2): qty 1

## 2020-11-18 NOTE — Evaluation (Signed)
Physical Therapy Evaluation & Discharge Patient Details Name: ROMEL DUMOND MRN: [id number] DOB: 1946-06-01 Today's Date: 11/18/2020   History of Present Illness  Pt is a 75 y.o. male admitted 11/15/20 with c/o chest pain and SOB. Workup for acute CHF exacerbation. Underwent nuclear stress test 5/26. PMH includes COPD (wears 5L O2 [place]; inconsistent access to power source for O2 machine), CAD s/p PCI (2019), CKD, HTN, CHF, HLD. Of note, recent admission 11/02/20-11/03/20 with hypoxic respiratory failure secondary to CHF.    Clinical Impression  Patient evaluated by Physical Therapy with no further acute PT needs identified. PTA, pt independent, wears O2 [place] at night, but lives in his car and has difficulty consistently using O2 (CM/SW aware). Today, pt independent with ambulation and ADL tasks; SpO2 97% on RA. All education has been completed and the patient has no further questions. Encouraged more frequent mobility during hospital admission. Acute PT is signing off. Thank you for this referral.    Follow Up Recommendations No PT follow up    Equipment Recommendations  None recommended by PT    Recommendations for Other Services       Precautions / Restrictions Precautions Precautions: Other (comment) Precaution Comments: Wears 2-3L O2 at night Restrictions Weight Bearing Restrictions: No      Mobility  Bed Mobility               General bed mobility comments: Received sitting in recliner    Transfers Overall transfer level: Independent Equipment used: None             General transfer comment: indep to stand from recliner and low toilet height  Ambulation/Gait Ambulation/Gait assistance: Independent Gait Distance (Feet): 470 Feet Assistive device: None Gait Pattern/deviations: WFL(Within Functional Limits)   Gait velocity interpretation: >2.62 ft/sec, indicative of community ambulatory General Gait Details: Steady gait without overt instability or LOB;  independent  Stairs            Wheelchair Mobility    Modified Rankin (Stroke Patients Only)       Balance Overall balance assessment: Independent                                           Pertinent Vitals/Pain Pain Assessment: Faces Faces Pain Scale: Hurts a little bit Pain Location: BLEs Pain Descriptors / Indicators: Cramping Pain Intervention(s): Monitored during session;Other (comment) (reports improved with walking)    Home Living Family/patient expects to be discharged to:: Shelter/Homeless                 Additional Comments: Lives in [place]. Can sometimes use friend's house for electricity to use oxygen    Prior Function Level of Independence: Independent         Comments: Independent without DME     Hand Dominance        Extremity/Trunk Assessment   Upper Extremity Assessment Upper Extremity Assessment: Overall WFL for tasks assessed    Lower Extremity Assessment Lower Extremity Assessment: Overall WFL for tasks assessed       Communication   Communication: HOH  Cognition Arousal/Alertness: Awake/alert Behavior During Therapy: WFL for tasks assessed/performed Overall Cognitive Status: Within Functional Limits for tasks assessed  General Comments General comments (skin integrity, edema, etc.): Difficulty getting reliable pulse ox reading; pt ran finger under warm water after ambulation, SpO2 97% on RA    Exercises     Assessment/Plan    PT Assessment Patent does not need any further PT services  PT Problem List         PT Treatment Interventions      PT Goals (Current goals can be found in the Care Plan section)  Acute Rehab PT Goals PT Goal Formulation: All assessment and education complete, DC therapy    Frequency     Barriers to discharge        Co-evaluation               AM-PAC PT "6 Clicks" Mobility  Outcome Measure Help needed  turning from your back to your side while in a flat bed without using bedrails?: None Help needed moving from lying on your back to sitting on the side of a flat bed without using bedrails?: None Help needed moving to and from a bed to a chair (including a wheelchair)?: None Help needed standing up from a chair using your arms (e.g., wheelchair or bedside chair)?: None Help needed to walk in hospital room?: None Help needed climbing 3-5 steps with a railing? : None 6 Click Score: 24    End of Session   Activity Tolerance: Patient tolerated treatment well Patient left: in chair;with call bell/phone within reach (with RT present) Nurse Communication: Mobility status PT Visit Diagnosis: Other abnormalities of gait and mobility (R26.89)    Time: 2395-3202 PT Time Calculation (min) (ACUTE ONLY): 20 min   Charges:   PT Evaluation $PT Eval Low Complexity: 1 Low     Ina Homes, PT, DPT Acute Rehabilitation Services  Pager [phone] Office [phone]  Malachy Chamber 11/18/2020, 9:34 AM

## 2020-11-18 NOTE — Progress Notes (Signed)
Subjective: No acute events overnight.   Mr. Thivierge was seen walking through the hallway without supplemental O2 prior to team checking in with him. He says he tolerated walking without difficulty, at home only utilizes oxygen at nighttime. Notes that he is missing a part for the device that would allow him to fill oxygen tanks rather than have the machine run continuously all night. He reports feeling well today, much better than at admission. Breathing without difficulty with no further chest pain. He is scheduled for Myoview later today as the study could not be completed yesterday.   Objective:  Vital signs in last 24 hours: Vitals:   11/18/20 0538 11/18/20 0826 11/18/20 0844 11/18/20 1112  BP: (!) 115/94  (!) 128/92 140/86  Pulse: 67  78 (!) 55  Resp: 16  17 19   Temp: 97.8 F (36.6 C)  98.6 F (37 C) 98 F (36.7 C)  TempSrc: Oral  Oral Oral  SpO2: 100% 98% 98% 100%  Weight:       Weight change: -0.26 kg  Intake/Output Summary (Last 24 hours) at 11/18/2020 1336 Last data filed at 11/18/2020 0847 Gross per 24 hour  Intake 1443 ml  Output 750 ml  Net 693 ml   BMP Latest Ref Rng & Units 11/18/2020 11/17/2020 11/16/2020  Glucose 70 - 99 mg/dL 98 89 11/18/2020)  BUN 8 - 23 mg/dL 710(G) 26(R) 48(N)  Creatinine 0.61 - 1.24 mg/dL 46(E) 7.03(J) 0.09(F)  Sodium 135 - 145 mmol/L 139 139 142  Potassium 3.5 - 5.1 mmol/L 4.2 4.1 4.3  Chloride 98 - 111 mmol/L 102 104 107  CO2 22 - 32 mmol/L 30 27 27   Calcium 8.9 - 10.3 mg/dL 8.18(E) ) 9.0   CBC Latest Ref Rng & Units 11/18/2020 11/16/2020 11/15/2020  WBC 4.0 - 10.5 K/uL 9.6 10.8(H) 8.7  Hemoglobin 13.0 - 17.0 g/dL 11.7(L) 12.8(L) 12.5(L)  Hematocrit 39.0 - 52.0 % 36.8(L) 40.2 39.6  Platelets 150 - 400 K/uL 203 239 250   Physical Exam: General: Cachexic, frail-appearing older gentleman in no acute distress HENT: Normocephalic, atraumatic. Hearing impaired bilaterally. Wearing corrective lenses CV: Distant heart sounds. Regular rate  and rhythm. S1/S2 present. No murmurs/rubs, or gallops appreciated. Trace BLE edema  Pulm: No wheezes or crackles. Breathing comfortably on 4L nasal cannula.  Abd: Nontender, nondistended. Normoactive bowel sounds. Derm: Skin dry, wrinkled, intact. No rashes or lesions on visible skin.  Neuro: Speech and cognition grossly intact. Able to ambulate without assistance. No gait abnormality. No gross focal neurologic deficits.  Psych: Appropriate affect. Thought process logical, linear, and goal-directed.   Assessment/Plan:  Principal Problem:   Acute exacerbation of CHF (congestive heart failure) [facility])  Mr. Zielke is a 75 year old gentleman with a history of combined heart failure with EF 25-30%, COPD with intermittent 4-5L O2 requirement, and CAD s/p PCI to left main in 2019 admitted for acute CHF exacerbation with chest pain.   #Acute on chronic systolic heart failure exacerbation Patient presented with progressive dyspnea on exertion, BNP>3000, and chest X-ray consistent with CHF exacerbation. His volume status continues to improve with 1.25 L urine output yesterday on Lasix, net volume decrease of 2L since admission. He is subjectively feeling much better now. Cardiology workup and recommendations pending.  - Continue Lasix 40mg  po daily  - Continue cardiac monitoring - Continue strict I/O monitoring and daily weight - Restrict po fluid intake to 1.5L/day - Continue supplemental O2 via nasal cannula as needed - Continue Coreg 6.25mg  BID for now -  Previously borderline-low pressures have stabilized, however he has not tolerated Entresto in the past due to hypotension.  - Start Farxiga 10mg  daily   #Chest pain #Hx of CAD s/p PCI left main in 2019 No further L chest pain radiating into L arm. Cardiology was consulted on 5/24 given complex cardiac history.  - Myoview was not able to be completed yesterday, scheduled for this afternoon. He does not want cardiac cath intervention unless a  severe blockage is found - Awaiting f/u cardiology recommendations  - Continue ASA 81mg , Plavix 75mg , Lipitor 80mg    #COPD with 5L oxygen requirement #Housing instability Patient lives in his 6/24, is unable to plug in oxygen machine some nights. He is apparently missing a piece of equipment.  - Appreciate social work's help with discharge planning  - Continue daily Incruse  Code status: FULL IVF: none Diet: 2g sodium restriction, 1.5L fluid restriction VTE ppx: Lovenox   LOS: 2 days   , Medical Student 11/18/2020, 1:36 PM

## 2020-11-18 NOTE — Progress Notes (Signed)
Progress Note  Patient Name: Ray Smith Date of Encounter: 11/18/2020  [facility] Cardiologist: Donato Schultz, MD   Subjective   No chest pain, cleared to walk by himself. Seen without O2. Will refuse lexiscan today, will only do treadmill test.  Inpatient Medications    Scheduled Meds: . aspirin EC  81 mg Oral Daily  . atorvastatin  80 mg Oral q1800  . carvedilol  6.25 mg Oral BID WC  . clopidogrel  75 mg Oral Daily  . enoxaparin (LOVENOX) injection  40 mg Subcutaneous Q24H  . furosemide  40 mg Oral Daily  . pantoprazole  40 mg Oral QHS  . sodium chloride flush  3 mL Intravenous Q12H  . umeclidinium bromide  1 puff Inhalation Daily   Continuous Infusions:  PRN Meds: acetaminophen **OR** acetaminophen, ipratropium-albuterol, nitroGLYCERIN, ondansetron **OR** ondansetron (ZOFRAN) IV, senna-docusate, sodium chloride   Vital Signs    Vitals:   11/18/20 0500 11/18/20 0538 11/18/20 0826 11/18/20 0844  BP:  (!) 115/94  (!) 128/92  Pulse:  67  78  Resp:  16  17  Temp:  97.8 F (36.6 C)  98.6 F (37 C)  TempSrc:  Oral  Oral  SpO2:  100% 98% 98%  Weight: 50.4 kg       Intake/Output Summary (Last 24 hours) at 11/18/2020 0944 Last data filed at 11/18/2020 0847 Gross per 24 hour  Intake 1443 ml  Output 750 ml  Net 693 ml   Last 3 Weights 11/18/2020 11/18/2020 11/17/2020  Weight (lbs) 111 lb 3.2 oz 111 lb 3.2 oz 111 lb 12.4 oz  Weight (kg) 50.44 kg 50.44 kg 50.7 kg      Telemetry    Sinus rhythm HR 60-70s - Personally Reviewed  ECG    No new tracings - Personally Reviewed  Physical Exam   GEN: thin male in no acute distress.   Neck: No JVD Cardiac: RRR, no murmurs, rubs, or gallops.  Respiratory: Clear to auscultation bilaterally. GI: Soft, nontender, non-distended  MS: No edema; No deformity. Neuro:  Nonfocal  Psych: Normal affect   Labs    High Sensitivity Troponin:   Recent Labs  Lab 11/02/20 0659 11/02/20 0939 11/15/20 1150 11/15/20 1431  11/15/20 1642  TROPONINIHS 44* 40* 44* 50* 53*      Chemistry Recent Labs  Lab 11/16/20 0349 11/17/20 0205 11/18/20 0328  NA 142 139 139  K 4.3 4.1 4.2  CL 107 104 102  CO2 27 27 30   GLUCOSE 105* 89 98  BUN 26* 33* 34*  CREATININE 1.45* 1.37* 1.34*  CALCIUM 9.0 8.8* 8.7*  PROT 5.5*  --   --   ALBUMIN 3.0*  --   --   AST 16  --   --   ALT 14  --   --   ALKPHOS 115  --   --   BILITOT 1.0  --   --   GFRNONAA 50* 53* 55*  ANIONGAP 8 8 7      Hematology Recent Labs  Lab 11/15/20 1150 11/16/20 0349 11/18/20 0328  WBC 8.7 10.8* 9.6  RBC 4.15* 4.25 3.84*  HGB 12.5* 12.8* 11.7*  HCT 39.6 40.2 36.8*  MCV 95.4 94.6 95.8  MCH 30.1 30.1 30.5  MCHC 31.6 31.8 31.8  RDW 14.6 14.3 14.3  PLT 250 239 203    BNP Recent Labs  Lab 11/15/20 1150  BNP 3,315.3*     DDimer No results for input(s): DDIMER in the last 168 hours.  Radiology    No results found.  Cardiac Studies   Nuclear medicine stress test 11/18/20: Stress images to be completed today   Echo 11/03/20: 1. Left ventricular ejection fraction, by estimation, is 25 to 30%. The  left ventricle has severely decreased function. The left ventricle  demonstrates global hypokinesis. The left ventricular internal cavity size  was moderately dilated. Left  ventricular diastolic parameters are consistent with Grade II diastolic  dysfunction (pseudonormalization). Elevated left atrial pressure.  2. Right ventricular systolic function is mildly reduced. The right  ventricular size is normal. Tricuspid regurgitation signal is inadequate  for assessing PA pressure.  3. Left atrial size was moderately dilated.  4. Right atrial size was mildly dilated.  5. The mitral valve is normal in structure. No evidence of mitral valve  regurgitation.  6. The aortic valve was not well visualized. Aortic valve regurgitation  is not visualized. Mild to moderate aortic valve stenosis. Mean gradient  only , but suspect low  flow low gradient aortic stenosis, AVA (1.1  cm^2) and DI (0.38) suggest moderate  AS  7. The inferior vena cava is normal in size with greater than 50%  respiratory variability, suggesting right atrial pressure of 3 mmHg.    Patient Profile     75 y.o. male with a hx of oxygen dependent COPD, chroninc combined heart failure, CAD (PCI with DES to left main /proximal LAD/proximal circumflex 12/2017), noncompliance, PAD s/p angioplasty of right SFA 10/11/2017), CKD stage III, HTN, HLD, who is being seen 11/16/2020 for the evaluation of chest pain and CHF.  Assessment & Plan    Chest pain CAD PCI 2019 - awaiting stress images in nuclear medicine today - continue DAPT given complex left main/LAD and Cx intervention - if nuclear study significantly abnormal, will consider repeat heart catheterization - complicating factors include noncompliance - hs troponin mild and flat   Acute on chronic systolic and diastolic heart failure Ischemic cardiomyopathy Mild to moderate AS CKD stage III - BNP 3315 - CXR consistent with CHF - recent echo with EF 25-30%, LV global hypokinesis, grade 2 DD, mildly reduced RV dysfunction, mild to moderate AS - diuresing on 40 mg PO lasix daily - overall net negative 2L with 1.2 L  Urine output yesterday - appears euvolemic - sCr 1.34 today with K 4.2 - consider adding spironolactone prior to D/C, pending renal function   COPD - has required [place] this admission    Pt underwent rest images in nuclear medicine yesterday. Due to equipment complications, unable to complete the stress images yesterday. Pending stress images today; however, pt how states he will not agree to lexiscan, only treadmill. He received coreg this morning and has required O2 this admission. Is not a treadmill candidate. Dr. Cristal Deer will discuss options for him, including possible heart cath this afternoon.     For questions or updates, please contact [facility] Please consult  [email or web address] for contact info under        Signed, Marcelino Duster, PA  11/18/2020, 9:44 AM

## 2020-11-18 NOTE — Progress Notes (Signed)
OT Cancellation Note  Patient Details Name: MICIAH COVELLI MRN: [id number] DOB: 1946-02-05   Cancelled Treatment:    Reason Eval/Treat Not Completed: OT screened, no needs identified, will sign off Per PT, pt Independent with ADLs/mobility. No formal OT eval needed.   Lorre Munroe 11/18/2020, 10:01 AM

## 2020-11-18 NOTE — TOC Progression Note (Signed)
Transition of Care [facility]) - Progression Note    Patient Details  Name: Ray Smith MRN: [id number] Date of Birth: 01-01-1946  Transition of Care [facility]) CM/SW Contact  Graves-Bigelow, Lamar Laundry, RN Phone Number: 11/18/2020, 4:39 PM  Clinical Narrative:  Patient is active with [employer] for durable medical equipment oxygen. Case Manager called Liaison Zach to assist patient. Patient provided his mobile number so that [employer] will be able to reach the patient at the [facility] [employer] center to assist with fill chambers for his oxygen. Case Manager discussed several options around town where the patient could plug in at electrical outlets. Case Manager also discussed with patient regarding a battery pack portable power station solar powered battery that he can purchase with the patient so he would not have to spend so much on gas. No further needs identified at this time.   Expected Discharge Plan: Home/Self Care Barriers to Discharge: Continued Medical Work up  Expected Discharge Plan and Services Expected Discharge Plan: Home/Self Care In-house Referral: Clinical Social Work Discharge Planning Services: CM Consult Post Acute Care Choice: NA Living arrangements for the past 2 months: No permanent address (lives in [place]- friend has his dog while in hospital)                 DME Arranged: N/A DME Agency: NA    Social Determinants of Health (SDOH) Interventions Food Insecurity Interventions: Other (Comment) (Patient reports having food stamps but they decreased his monthly amound it was $250 but he isn't sure what it is now.) Financial Strain Interventions: Other (Comment) (Referral to [facility] outpatient clinic to meet with outpatient CSW) Housing Interventions: Other (Comment) (Patient reports he lives in his [name] and needs to plug in his breathing machine at night and can't afford gas to keep his vehicle running constantly.) Transportation Interventions: Other Orthoptist  (Patient is living in his [name] and driving it but can't always afford gas.)  Readmission Risk Interventions Readmission Risk Prevention Plan 11/17/2020  Transportation Screening Complete  PCP or Specialist Appt within 3-5 Days Complete  HRI or Home Care Consult Complete  Palliative Care Screening Not Applicable  Some recent data might be hidden

## 2020-11-18 NOTE — Progress Notes (Signed)
Brief cardiology progress note: Results of stress test reviewed. Evidence of infarcts and LV hypokinesis, but no reversible ischemia noted. Will plan to continue with medical management. Without ischemia, low utility for cath, will not pursue at this time.  Jodelle Red, MD, PhD, [facility]  [facility]  [address], [address] [place], [place] [address] [phone]

## 2020-11-18 NOTE — Progress Notes (Signed)
   Ray Smith presented for a nuclear stress test today.  No immediate complications.  Stress imaging is pending at this time.  Preliminary EKG findings may be listed in the chart, but the stress test result will not be finalized until perfusion imaging is complete.  Roe Rutherford Calista Crain, PA-C 11/18/2020, 2:58 PM

## 2020-11-18 NOTE — Progress Notes (Signed)
   11/18/20 1044  Mobility  Activity Ambulated in hall  Range of Motion/Exercises Active;All extremities  Level of Assistance Independent  Assistive Device None  Minutes Stood 5 minutes  Minutes Ambulated 5 minutes  Distance Ambulated (ft) 500 ft  Mobility Ambulated independently in hallway  Mobility Response Tolerated well  Mobility performed by Mobility specialist  Bed Position Chair  $Mobility charge 1 Mobility    Patient ambulated in hallway independently without incident or complaint.

## 2020-11-18 NOTE — Progress Notes (Signed)
Heart Failure Stewardship Pharmacist Progress Note   PCP: Pcp, No PCP-Cardiologist: Donato Schultz, MD    HPI:  75 yo male with PMH COPD on 5L at night, CAD s/p PCI to LM in 2019, PVD s/p angioplasty in 2019, HTN, HLD, CKD2, and systolic HF. Recently admitted and found to have new diagnosis of CHF with ECHO on 11/03/20 with LVEF 25-30% and RV function mildly reduced. Was discharged 11/03/20 with carvedilol and furosemide 40 mg QOD. He presents 2 weeks later with gradual onset CP and SOB. BNP elevated on admit with bilateral LEE unable to put on shoes recently. Awaiting results of rest images from nuclear medicine yesterday (unable to complete stress images). Plans noted for repeat heart catheterization if nuclear study results are significantly abnormal.  Current HF Medications: IV furosemide 40 mg BID Carvedilol 6.25 mg BID  Prior to admission HF Medications: PO furosemide 40 mg QOD Carvedilol 3.125 mg BID  Pertinent Lab Values: . Serum creatinine 1.34 (BL 1.1-1.2), BUN 34, Potassium 4.2, Sodium 139, BNP 3315, Magnesium 1.9  Vital Signs: . Weight: 111 lbs (admission weight: 117 lbs) . Blood pressure: 120-140s/80-90s  . Heart rate: 70s   Medication Assistance / Insurance Benefits Check: Does the patient have prescription insurance?  Yes Type of insurance plan: [employer] Medicare, [place] Medicaid  Outpatient Pharmacy:  Prior to admission outpatient pharmacy: [facility] Is the patient willing to use [facility] TOC pharmacy at discharge? Yes Is the patient willing to transition their outpatient pharmacy to utilize a [facility] outpatient pharmacy?   Pending    Assessment: 1. Acute on chronic systolic CHF (EF 97-02%), due to ICM. NYHA class II-III symptoms. - Continues to have adequate response to diuretics (1.2L uop/24 hrs), weight stable - agree with changing to PO furosemide 40 mg daily - Continue carvedilol 6.25 mg BID. Do not increase dose further until euvolemic. - BP improving/rising, most  recently 140/86. Consider starting Entresto 24/26 mg BID prior to discharge. If BP limits ARNI, consider adding low-dose ARB. - Consider adding spironolactone and Farxiga 10 mg daily prior to discharge   Plan: 1) Medication changes recommended at this time: - Add Farxiga 10 mg daily  2) Patient assistance: - Sherryll Burger copay $4/month - Marcelline Deist copay $4/month  3)  Education  - To be completed prior to discharge  Tama Headings, PharmD, BCPS Sharen Hones, PharmD, BCPS Heart Failure Stewardship Pharmacist Phone [phone]

## 2020-11-19 ENCOUNTER — Other Ambulatory Visit (HOSPITAL_COMMUNITY): Payer: Self-pay

## 2020-11-19 DIAGNOSIS — I25118 Atherosclerotic heart disease of native coronary artery with other forms of angina pectoris: Secondary | ICD-10-CM | POA: Diagnosis not present

## 2020-11-19 DIAGNOSIS — I5023 Acute on chronic systolic (congestive) heart failure: Secondary | ICD-10-CM | POA: Diagnosis not present

## 2020-11-19 DIAGNOSIS — I255 Ischemic cardiomyopathy: Secondary | ICD-10-CM | POA: Diagnosis not present

## 2020-11-19 DIAGNOSIS — I5043 Acute on chronic combined systolic (congestive) and diastolic (congestive) heart failure: Secondary | ICD-10-CM | POA: Diagnosis not present

## 2020-11-19 DIAGNOSIS — R079 Chest pain, unspecified: Secondary | ICD-10-CM | POA: Diagnosis not present

## 2020-11-19 LAB — CBC
HCT: 39.6 % (ref 39.0–52.0)
Hemoglobin: 12.4 g/dL — ABNORMAL LOW (ref 13.0–17.0)
MCH: 30 pg (ref 26.0–34.0)
MCHC: 31.3 g/dL (ref 30.0–36.0)
MCV: 95.9 fL (ref 80.0–100.0)
Platelets: 234 10*3/uL (ref 150–400)
RBC: 4.13 MIL/uL — ABNORMAL LOW (ref 4.22–5.81)
RDW: 14.1 % (ref 11.5–15.5)
WBC: 9 10*3/uL (ref 4.0–10.5)
nRBC: 0 % (ref 0.0–0.2)

## 2020-11-19 LAB — BASIC METABOLIC PANEL
Anion gap: 6 (ref 5–15)
BUN: 28 mg/dL — ABNORMAL HIGH (ref 8–23)
CO2: 30 mmol/L (ref 22–32)
Calcium: 8.9 mg/dL (ref 8.9–10.3)
Chloride: 102 mmol/L (ref 98–111)
Creatinine, Ser: 1.1 mg/dL (ref 0.61–1.24)
GFR, Estimated: >60 mL/min (ref >60)
Glucose, Bld: 88 mg/dL (ref 70–99)
Potassium: 4 mmol/L (ref 3.5–5.1)
Sodium: 138 mmol/L (ref 135–145)

## 2020-11-19 LAB — LIPID PANEL
Cholesterol: 117 mg/dL (ref 0–200)
HDL: 43 mg/dL (ref >40)
LDL Cholesterol: 57 mg/dL (ref 0–99)
Total CHOL/HDL Ratio: 2.7 RATIO
Triglycerides: 85 mg/dL (ref <150)
VLDL: 17 mg/dL (ref 0–40)

## 2020-11-19 MED ORDER — CARVEDILOL 6.25 MG PO TABS
6.2500 mg | ORAL_TABLET | Freq: Two times a day (BID) | ORAL | 0 refills | Status: DC
  Filled 2020-11-19: qty 60, 30d supply, fill #0

## 2020-11-19 MED ORDER — FUROSEMIDE 40 MG PO TABS
40.0000 mg | ORAL_TABLET | Freq: Every day | ORAL | 0 refills | Status: DC
  Filled 2020-11-19: qty 30, 30d supply, fill #0

## 2020-11-19 MED ORDER — SACUBITRIL-VALSARTAN 24-26 MG PO TABS
1.0000 | ORAL_TABLET | Freq: Two times a day (BID) | ORAL | Status: DC
  Administered 2020-11-19: 1 via ORAL
  Filled 2020-11-19: qty 1

## 2020-11-19 MED ORDER — SACUBITRIL-VALSARTAN 24-26 MG PO TABS
1.0000 | ORAL_TABLET | Freq: Two times a day (BID) | ORAL | 0 refills | Status: AC
  Filled 2020-11-19: qty 60, 30d supply, fill #0

## 2020-11-19 MED ORDER — DAPAGLIFLOZIN PROPANEDIOL 10 MG PO TABS
10.0000 mg | ORAL_TABLET | Freq: Every day | ORAL | 0 refills | Status: DC
  Filled 2020-11-19: qty 30, 30d supply, fill #0

## 2020-11-19 MED ORDER — ALBUTEROL SULFATE (2.5 MG/3ML) 0.083% IN NEBU
2.5000 mg | INHALATION_SOLUTION | Freq: Four times a day (QID) | RESPIRATORY_TRACT | 5 refills | Status: DC | PRN
  Filled 2020-11-19: qty 90, 8d supply, fill #0

## 2020-11-19 NOTE — Discharge Summary (Signed)
Name: Ray Smith MRN: [id number] DOB: [birth year] 75 y.o. PCP: Pcp, No  Date of Admission: 11/15/2020 11:22 AM Date of Discharge: 11/19/2020 Attending Physician: Dr Earl Lagos   Discharge Diagnosis: 1. Acute on chronic HFrEF exacerbation 2. Chronic hypoxic respiratory failure on 5L  3. Hx of CAD s/p PCI/DES to L main in 2019 4. PVD s/p angioplasty in 2019 5. Hypertension 6. Hyperlipidemia  7. CKD stage II   Discharge Medications: Allergies as of 11/19/2020      Reactions   Tramadol Anaphylaxis   Flexeril [cyclobenzaprine] Other (See Comments)   Per patient "it made my heart stop"   Ibuprofen Other (See Comments)   Overuse damages pt's kidneys   Ibuprofen Nausea Only, Other (See Comments)   Reaction not recalled- perhaps made his stomach hurt   Lactose Intolerance (gi) Other (See Comments)   Digestive issues      Medication List    STOP taking these medications   albuterol (5 MG/ML) 0.5% nebulizer solution Commonly known as: PROVENTIL Replaced by: albuterol (2.5 MG/3ML) 0.083% nebulizer solution You also have another medication with the same name that you need to continue taking as instructed.     TAKE these medications   albuterol 108 (90 Base) MCG/ACT inhaler Commonly known as: VENTOLIN HFA Inhale 2 puffs into the lungs every 6 (six) hours as needed for wheezing or shortness of breath. What changed:   Another medication with the same name was added. Make sure you understand how and when to take each.  Another medication with the same name was removed. Continue taking this medication, and follow the directions you see here.   albuterol (2.5 MG/3ML) 0.083% nebulizer solution Commonly known as: PROVENTIL Use 3 mLs (2.5 mg total) by nebulization every 6 (six) hours as needed for wheezing or shortness of breath. What changed: You were already taking a medication with the same name, and this prescription was added. Make sure you understand how and when to take  each. Replaces: albuterol (5 MG/ML) 0.5% nebulizer solution   aspirin 81 MG EC tablet Take 1 tablet (81 mg total) by mouth daily.   atorvastatin 80 MG tablet Commonly known as: LIPITOR Take 1 tablet (80 mg total) by mouth daily at 6 PM.   carvedilol 6.25 MG tablet Commonly known as: COREG Take 1 tablet (6.25 mg total) by mouth 2 (two) times daily with a meal. What changed:   medication strength  how much to take   clopidogrel 75 MG tablet Commonly known as: PLAVIX Take 1 tablet (75 mg total) by mouth daily.   Entresto 24-26 MG Generic drug: sacubitril-valsartan Take 1 tablet by mouth 2 (two) times daily.   Farxiga 10 MG Tabs tablet Generic drug: dapagliflozin propanediol Take 1 tablet (10 mg total) by mouth daily. Start taking on: Nov 20, 2020   furosemide 40 MG tablet Commonly known as: LASIX Take 1 tablet (40 mg total) by mouth daily. Start taking on: Nov 20, 2020 What changed:   when to take this  additional instructions   ipratropium 0.03 % nasal spray Commonly known as: ATROVENT Place 2 sprays into both nostrils every 12 (twelve) hours.   nitroGLYCERIN 0.4 MG SL tablet Commonly known as: NITROSTAT Place 1 tablet (0.4 mg total) under the tongue every 5 (five) minutes x 3 doses as needed for chest pain.   OXYGEN Inhale 5 L/min into the lungs at bedtime as needed (for shortness of breath).   pantoprazole 40 MG tablet Commonly known as: PROTONIX  TAKE 1 TABLET BY MOUTH EVERYDAY AT BEDTIME What changed:   how much to take  how to take this  when to take this  additional instructions   Stiolto Respimat 2.5-2.5 MCG/ACT Aers Generic drug: Tiotropium Bromide-Olodaterol INHALE 2 PUFFS BY MOUTH INTO THE LUNGS DAILY What changed:   how much to take  how to take this  when to take this  additional instructions            Durable Medical Equipment  (From admission, onward)         Start     Ordered   11/19/20 1302  DME Oxygen  Once        Question Answer Comment  Length of Need Lifetime   Frequency Continuous (stationary and portable oxygen unit needed)   Oxygen conserving device Yes   Oxygen delivery system Gas      11/19/20 1306          Disposition and follow-up:   Ray Smith was discharged from [facility] in Stable condition.  At the hospital follow up visit please address:  1.  Acute on chronic HF exacerbation: Discharged with Lasix 40mg  daily, Entresto 24-26mg  twice daily, Carvedilol 6.25mg  daily and Farxiga 10mg  daily. At f/u, please check volume status, BP and BMP.   Hx of CAD with prior PCI to L main in 2019: Continue aspirin, plavix and atorvastatin.   Chronic hypoxic respiratory failure: Please ensure patient has access to his oxygen and is able to get the power supply for this.   2.  Labs / imaging needed at time of follow-up: BMP  3.  Pending labs/ test needing follow-up: HbA1c   Follow-up Appointments:  Follow-up Information    , NP Follow up on 12/16/2020.   Specialty: Cardiology Why: Please arrive 15 minutes early for your 9:15am post-hospital cardiology appointment Contact information: [address] [place] [phone]        [name] Follow up on 11/26/2020.   Specialty: Cardiology Why: Please arrive 15 minutes early for your 2pm post-hospital cardiology appointment. Please use entrace C off [phone]. There is free valet parking available.  Contact information: [address] [employer] [place] [id number] [place] [phone]       [facility]. Go on 12/14/2020.   Why: @2 :30pm with [phone] Contact information: [address] [place] [phone]              Hospital Course by problem list: 1. Acute on chronic systolic heart failure exacerbation Patient  initially presented with dyspnea on exertion, worsening orthopnea and increased lower extremity edema with left sided chest pain. Noted to have elevated BNP to 3315. Initial exam consistent with hypervolemia in setting of acute heart failure exacerbation. Patient diuresed during admission with cumulative 6.3L urine output, net -1.9L with 3kg down from admission. Patient appeared euvolemic on exam on discharge (50.2kg). Coreg increased to minimize PVC burden and Lasix frequency increased to daily dosing. [place] and Entresto added to patient's regimen to optimize GDMT. Patient scheduled to follow up with cardiology.   2. Chest pain Hx of CAD s/p PCI to L main in 2019 Patient presented with left sided chest pain radiating to left arm and neck. No significant EKG changes noted and troponins ~50. Patient underwent myoview for further evaluation which was significant for large fixed defects involving apical segments of  LV and septal wall, compatible with infarcts without evidence for reversible ischemia. Decision made to not pursue catheter at this time. Patient continued on aspirin and plavix indefinitely with atorvastatin 80mg  daily.   3. Chronic hypoxic respiratory failure on 5L O2  Patient is on 5L oxygen at night for presumed severe COPD (no PFTs on file). He is on resmipat and albuterol nebulizers. Patient notes running out of oxygen and not being able to use his oxygen tanks due to high gas prices. He was unable to find an electric outlet. TOC consulted this admission to assist with outpatient resources. He will have e-tank delivered via [employer] and will also have tech from [employer] to assist with his current oxygen tanks.   4. PVD s/p angioplasty in 2019 This is stable. Patient continued on aspirin and plavix indefinitely.   5. Hypertension BP's have been soft during this admission. Patient is currently on maximally tolerated goal directed medical therapy for heart failure as above.   6. Hyperlipidemia   Continued on atorvastatin 80mg  daily.   7. CKD stage II  sCr at baseline 1.1  Discharge Exam:   BP (!) 128/96 (BP Location: Left Arm)   Pulse 82   Temp 97.6 F (36.4 C) (Oral)   Resp 18   Wt 50.2 kg   SpO2 94%   BMI 15.87 kg/m  Discharge exam:  Physical Exam  Constitutional: Elderly, cachectic, chronically ill appearing male No distress.  HENT: Normocephalic and atraumatic, EOMI Cardiovascular: Normal rate, regular rhythm, S1 and S2 present, no murmurs, rubs, gallops.  Distal pulses intact Respiratory: No respiratory distress, no accessory muscle use. Lungs are clear to auscultation bilaterally. On room air.  GI: Nondistended, soft, nontender to palpation, active bowel sounds Musculoskeletal: No peripheral edema noted. Neurological: Is alert and oriented x4, no apparent focal deficits noted. Skin: Warm and dry.  No rash, erythema, lesions noted. Psychiatric: Normal mood and affect. Behavior is normal. Judgment and thought content normal.   Pertinent Labs, Studies, and Procedures:  CBC Latest Ref Rng & Units 11/19/2020 11/18/2020 11/16/2020  WBC 4.0 - 10.5 K/uL 9.0 9.6 10.8(H)  Hemoglobin 13.0 - 17.0 g/dL 12.4(L) 11.7(L) 12.8(L)  Hematocrit 39.0 - 52.0 % 39.6 36.8(L) 40.2  Platelets 150 - 400 K/uL 234 203 239   BMP Latest Ref Rng & Units 11/19/2020 11/18/2020 11/17/2020  Glucose 70 - 99 mg/dL 88 98 89  BUN 8 - 23 mg/dL 11/20/2020) 11/19/2020) 78(M)  Creatinine 0.61 - 1.24 mg/dL 76(H 20(N) 4.70)  Sodium 135 - 145 mmol/L 138 139 139  Potassium 3.5 - 5.1 mmol/L 4.0 4.2 4.1  Chloride 98 - 111 mmol/L 102 102 104  CO2 22 - 32 mmol/L 30 30 27   Calcium 8.9 - 10.3 mg/dL 8.9 9.62(E) 3.66(Q)   BNP (last 3 results) Recent Labs    11/02/20 0659 11/15/20 1150  BNP 2,596.6* 3,315.3*   TSH 0.350 - 4.500 uIU/mL 2.618    Troponin I (High Sensitivity) <18 ng/L 53High  50High CM  44High   Lipid Panel     Component Value Date/Time   CHOL 117 11/19/2020 1032   TRIG 85 11/19/2020 1032    HDL 43 11/19/2020 1032   CHOLHDL 2.7 11/19/2020 1032   VLDL 17 11/19/2020 1032   LDLCALC 57 11/19/2020 1032   CXR 11/15/2020:  IMPRESSION: Persistent bilateral effusions with atelectasis and or pneumonia in both lower lobes. Cardiomegaly and aortic atherosclerosis as seen previously.  MYOVIEW LEXISCAN 11/18/2020: IMPRESSION: 1. Abnormal uptake throughout the left ventricle compatible  with large infarcts. No clear evidence for reversible ischemia. 2. Severe diffuse hypokinesia throughout the left ventricle. 3. Left ventricular ejection fraction is 17%. 4. Non invasive risk stratification*: High   Discharge Instructions: Discharge Instructions    (HEART FAILURE PATIENTS) Call MD:  Anytime you have any of the following symptoms: 1) 3 pound weight gain in 24 hours or 5 pounds in 1 week 2) shortness of breath, with or without a dry hacking cough 3) swelling in the hands, feet or stomach 4) if you have to sleep on extra pillows at night in order to breathe.   Complete by: As directed    Call MD for:  difficulty breathing, headache or visual disturbances   Complete by: As directed    Call MD for:  extreme fatigue   Complete by: As directed    Call MD for:  persistant dizziness or light-headedness   Complete by: As directed    Call MD for:  persistant nausea and vomiting   Complete by: As directed    Call MD for:  temperature >100.4   Complete by: As directed    Diet - low sodium heart healthy   Complete by: As directed    Diet - low sodium heart healthy   Complete by: As directed    Discharge instructions   Complete by: As directed    Ray Smith, Ray Smith were admitted to the hospital with worsening of your heart failure. You were treated with Lasix to help get the extra fluid off. You had a heart scan that did not show any reversible cause of this. On discharge, please take all prescriptions as prescribed. A representative from [employer] will contact you to help with the oxygen in your [place].  Please follow up with cardiology and your primary care doctor on discharge.   Aspirin 81 mg daily Clopidogrel 75 mg daily Atorvastatin 80 mg daily Carvedilol 6.25 mg BID (increased from home dose) Dapagliflozin 10 mg daily (new) Furosemide 40 mg daily (change in dose) Entresto 24-26mg  BID (new)   Thank you!   Increase activity slowly   Complete by: As directed    Increase activity slowly   Complete by: As directed       Signed: Eliezer Bottom, MD 11/19/2020, 6:15 PM   Pager: @[phone]

## 2020-11-19 NOTE — Progress Notes (Signed)
Progress Note  Patient Name: Ray Smith Date of Encounter: 11/19/2020  [facility] Cardiologist: Donato Schultz, MD   Subjective   Feeling "great" today. Reviewed findings from stress test. He reports that his oxygen levels have been 100% since starting current medications this admission. Main concern is about living situation after he leaves the hospital. No chest pain, breathing stable.  Inpatient Medications    Scheduled Meds: . aspirin EC  81 mg Oral Daily  . atorvastatin  80 mg Oral q1800  . carvedilol  6.25 mg Oral BID WC  . clopidogrel  75 mg Oral Daily  . dapagliflozin propanediol  10 mg Oral Daily  . enoxaparin (LOVENOX) injection  40 mg Subcutaneous Q24H  . furosemide  40 mg Oral Daily  . pantoprazole  40 mg Oral QHS  . sacubitril-valsartan  1 tablet Oral BID  . sodium chloride flush  3 mL Intravenous Q12H  . umeclidinium bromide  1 puff Inhalation Daily   Continuous Infusions:  PRN Meds: acetaminophen **OR** acetaminophen, ipratropium-albuterol, nitroGLYCERIN, ondansetron **OR** ondansetron (ZOFRAN) IV, senna-docusate, sodium chloride   Vital Signs    Vitals:   11/19/20 0230 11/19/20 0353 11/19/20 0804 11/19/20 0855  BP:  128/78  (!) 141/97  Pulse:  82 82 82  Resp:  18 18 18   Temp:  (!) 97.4 F (36.3 C)    TempSrc:  Oral    SpO2:  100%  94%  Weight: 50.2 kg       Intake/Output Summary (Last 24 hours) at 11/19/2020 1102 Last data filed at 11/19/2020 0957 Gross per 24 hour  Intake 730 ml  Output 850 ml  Net -120 ml   Last 3 Weights 11/19/2020 11/18/2020 11/18/2020  Weight (lbs) 110 lb 9.6 oz 111 lb 3.2 oz 111 lb 3.2 oz  Weight (kg) 50.168 kg 50.44 kg 50.44 kg      Telemetry    NSR - Personally Reviewed  ECG    SR, 1st degree AV block, RBBB, occasional PAC - Personally Reviewed  Physical Exam   GEN: No acute distress.  Frail appearing but ambulating from sink to chair on room air Neck: No JVD Cardiac: RRR, no rubs, or gallops. No murmur  appreciated, but distant heart sounds Respiratory: Clear to auscultation bilaterally, distant breath sounds GI: Soft, nontender, non-distended  MS: No edema; No deformity. Neuro:  Nonfocal  Psych: Normal affect   Labs    High Sensitivity Troponin:   Recent Labs  Lab 11/02/20 0659 11/02/20 0939 11/15/20 1150 11/15/20 1431 11/15/20 1642  TROPONINIHS 44* 40* 44* 50* 53*      Chemistry Recent Labs  Lab 11/16/20 0349 11/17/20 0205 11/18/20 0328  NA 142 139 139  K 4.3 4.1 4.2  CL 107 104 102  CO2 27 27 30   GLUCOSE 105* 89 98  BUN 26* 33* 34*  CREATININE 1.45* 1.37* 1.34*  CALCIUM 9.0 8.8* 8.7*  PROT 5.5*  --   --   ALBUMIN 3.0*  --   --   AST 16  --   --   ALT 14  --   --   ALKPHOS 115  --   --   BILITOT 1.0  --   --   GFRNONAA 50* 53* 55*  ANIONGAP 8 8 7      Hematology Recent Labs  Lab 11/15/20 1150 11/16/20 0349 11/18/20 0328  WBC 8.7 10.8* 9.6  RBC 4.15* 4.25 3.84*  HGB 12.5* 12.8* 11.7*  HCT 39.6 40.2 36.8*  MCV 95.4  94.6 95.8  MCH 30.1 30.1 30.5  MCHC 31.6 31.8 31.8  RDW 14.6 14.3 14.3  PLT 250 239 203    BNP Recent Labs  Lab 11/15/20 1150  BNP 3,315.3*     DDimer No results for input(s): DDIMER in the last 168 hours.   Radiology    NM Myocar Multi W/Spect W/Wall Motion / EF  Result Date: 11/18/2020 CLINICAL DATA:  75 year old with chest pain, nonspecific. EXAM: MYOCARDIAL IMAGING WITH SPECT (REST AND PHARMACOLOGIC-STRESS - 2 DAY PROTOCOL) GATED LEFT VENTRICULAR WALL MOTION STUDY LEFT VENTRICULAR EJECTION FRACTION TECHNIQUE: Standard myocardial SPECT imaging was performed after resting intravenous injection of 10 mCi Tc-57m tetrofosmin. Subsequently, on a second day, intravenous infusion of Lexiscan was performed under the supervision of the Cardiology staff. At peak effect of the drug, 32.4 mCi Tc-29m tetrofosmin was injected intravenously and standard myocardial SPECT imaging was performed. Quantitative gated imaging was also performed to  evaluate left ventricular wall motion, and estimate left ventricular ejection fraction. COMPARISON:  None. FINDINGS: Perfusion: Markedly abnormal perfusion throughout the left ventricle. Large fixed defects involving the apical segments of the left ventricle and the septal wall. Findings compatible with infarcts. There is evidence for extracardiac activity along the lateral wall on the rest images. No clear evidence for reversible ischemia. Wall Motion: Diffuse severe hypokinesia throughout the left ventricle. Left Ventricular Ejection Fraction: 17 % End diastolic volume 262 ml End systolic volume 216 ml IMPRESSION: 1. Abnormal uptake throughout the left ventricle compatible with large infarcts. No clear evidence for reversible ischemia. 2. Severe diffuse hypokinesia throughout the left ventricle. 3. Left ventricular ejection fraction is 17%. 4. Non invasive risk stratification*: High *2012 Appropriate Use Criteria for Coronary Revascularization Focused Update: J Am Coll Cardiol. 2012;59(9):857-881. http://content.dementiazones.com.aspx?articleid=[email or web address] Electronically Signed   By: Richarda Overlie M.D.   On: 11/18/2020 16:03    Cardiac Studies   Nuclear medicine stress test 11/18/20: Perfusion: Markedly abnormal perfusion throughout the left ventricle. Large fixed defects involving the apical segments of the left ventricle and the septal wall. Findings compatible with infarcts. There is evidence for extracardiac activity along the lateral wall on the rest images. No clear evidence for reversible ischemia.  Wall Motion: Diffuse severe hypokinesia throughout the left ventricle.  Left Ventricular Ejection Fraction: 17 %  End diastolic volume 262 ml  End systolic volume 216 ml  IMPRESSION: 1. Abnormal uptake throughout the left ventricle compatible with large infarcts. No clear evidence for reversible ischemia.  2. Severe diffuse hypokinesia throughout the left ventricle.  3. Left  ventricular ejection fraction is 17%.  4. Non invasive risk stratification*: High   Echo 11/03/20: 1. Left ventricular ejection fraction, by estimation, is 25 to 30%. The  left ventricle has severely decreased function. The left ventricle  demonstrates global hypokinesis. The left ventricular internal cavity size  was moderately dilated. Left  ventricular diastolic parameters are consistent with Grade II diastolic  dysfunction (pseudonormalization). Elevated left atrial pressure.  2. Right ventricular systolic function is mildly reduced. The right  ventricular size is normal. Tricuspid regurgitation signal is inadequate  for assessing PA pressure.  3. Left atrial size was moderately dilated.  4. Right atrial size was mildly dilated.  5. The mitral valve is normal in structure. No evidence of mitral valve  regurgitation.  6. The aortic valve was not well visualized. Aortic valve regurgitation  is not visualized. Mild to moderate aortic valve stenosis. Mean gradient  only , but suspect low flow low gradient aortic stenosis, AVA (1.1  cm^2) and DI (0.38) suggest moderate  AS  7. The inferior vena cava is normal in size with greater than 50%  respiratory variability, suggesting right atrial pressure of 3 mmHg.   Patient Profile     75 y.o. male with PMH chronic combined systolic and diastolic heart failure, CAD s/p prior PCI, ischemic cardiomyopathy, PAD with prior angioplasty of R SFA, COPD, hypertension, hyperlipidemia who is being followed in consultation for chest pain and heart failure at the request of Dr. Heide Spark.  Assessment & Plan    Chest pain Known CAD, with prior PCI 2019 Hypercholesterolemia -chest pain resolved. May have been due to stress of acute heart failure/wall tension prior to admission, none since diuresis. Elevated troponin pattern could be consistent with demand ischemia. -nuclear stress test high risk due to scar/EF, but no reversible ischemia  seen. Utility of cath is low, will not pursue at this time -continue aspirin 81 mg daily, clopidogrel 75 mg indefinitely -continue atorvastatin 80 mg daily. Adding lipids on to labs.  Ischemic cardiomyopathy Acute on chronic combined systolic and diastolic heart failure Mild to moderate aortic stenosis -echo as above -euvolemic, on room air -continue carvedilol, increased slightly this admission -continue oral lasix daily,  -added farxiga this admission. Added A1c on to labs -trialing low dose entresto today, though monitoring BP closely. If cannot tolerate entresto, would trial low dose ARB -awaiting labs today, though renal function has largely been stable -consider spironolactone, though will need to make sure BP, K, and renal function allow for this -appreciate pharmacy assistance  I anticipate from a cardiac standpoint that he could be discharged later today if he tolerates entresto and labs are stable. I am not certain if his social needs will be managed by that time, but will leave recommendations in case discharge is an option for him.  If there are any questions or concerns, please do not hesitate to contact us.  [facility] will sign off.   Medication Recommendations:   Aspirin 81 mg daily Clopidogrel 75 mg daily Atorvastatin 80 mg daily Carvedilol 6.25 mg BID (increased from home dose) Dapagliflozin 10 mg daily (new) Furosemide 40 mg daily (change in dose) Entresto 24-26mg  BID (new)  Other recommendations (labs, testing, etc):  Bmet at follow up  Follow up as an outpatient:  Has an appt 11/26/20 with our heart failure impact clinic. We will also arrange for follow up with Dr. Anne Fu or a member of his team.  For questions or updates, please contact [facility] Please consult [email or web address] for contact info under        Signed, Jodelle Red, MD  11/19/2020, 11:02 AM

## 2020-11-19 NOTE — Progress Notes (Signed)
Heart Failure Stewardship Pharmacist Progress Note   PCP: Pcp, No PCP-Cardiologist: Donato Schultz, MD    HPI:  75 yo male with PMH COPD on 5L at night, CAD s/p PCI to LM in 2019, PVD s/p angioplasty in 2019, HTN, HLD, CKD2, and systolic HF. Recently admitted and found to have new diagnosis of CHF with ECHO on 11/03/20 with LVEF 25-30% and RV function mildly reduced. Was discharged 11/03/20 with carvedilol and furosemide 40 mg QOD. He presents 2 weeks later with gradual onset CP and SOB. BNP elevated on admit with bilateral LEE unable to put on shoes recently. Stress test with evidence of infarcts and LV hypokinesis, but no reversible ischemia noted. Plan to continue with medical management and not pursue cath at this time.  Current HF Medications: PO furosemide 40 mg daily Carvedilol 6.25 mg BID Farxiga 10 mg daily  Prior to admission HF Medications: PO furosemide 40 mg QOD Carvedilol 3.125 mg BID  Pertinent Lab Values: . 11/19/20 BMET pending . 11/18/20: Serum creatinine 1.34 (BL 1.1-1.2), BUN 34, Potassium 4.2, Sodium 139, BNP 3315, Magnesium 1.9  Vital Signs: . Weight: 110 lbs (admission weight: 117 lbs) . Blood pressure: 120-140s/80-90s  . Heart rate: 70s   Medication Assistance / Insurance Benefits Check: Does the patient have prescription insurance?  Yes Type of insurance plan: [employer] Medicare, [place] Medicaid  Outpatient Pharmacy:  Prior to admission outpatient pharmacy: [facility] Is the patient willing to use [facility] TOC pharmacy at discharge? Yes Is the patient willing to transition their outpatient pharmacy to utilize a [facility] outpatient pharmacy?   Pending    Assessment: 1. Acute on chronic systolic CHF (EF 58-30%), due to ICM. NYHA class II-III symptoms. - Continue PO furosemide 40 mg daily - Continue carvedilol 6.25 mg BID. Do not increase dose further until euvolemic. - Continue Farxiga 10 mg daily - BP improved and stable, most recently 141/97. Consider starting  Entresto 24/26 mg BID given significantly improved BP. - Discussed with Dr. Cristal Deer, noted that he has not tolerated Entresto in the past due to low BP. If he does not tolerate Entresto again, can consider de-escalating to low-dose ARB - Consider adding spironolactone prior to discharge    Plan: 1) Medication changes recommended at this time: - Add Entresto 24/26 mg BID  2) Patient assistance: - Sherryll Burger copay $4/month - Marcelline Deist copay $4/month  3)  Education  - To be completed prior to discharge  Tama Headings, PharmD, BCPS Sharen Hones, PharmD, BCPS Heart Failure Stewardship Pharmacist Phone [phone]

## 2020-11-19 NOTE — Progress Notes (Signed)
   11/19/20 0900  Mobility  Activity Ambulated in hall  Range of Motion/Exercises Active;All extremities  Level of Assistance Independent  Assistive Device None  Minutes Stood 5 minutes  Minutes Ambulated 5 minutes  Distance Ambulated (ft) 460 ft  Mobility Ambulated independently in hallway  Mobility Response Tolerated well  Mobility performed by Mobility specialist  Bed Position Semi-fowlers  $Mobility charge 1 Mobility   Ambulated in hallway independently. Tolerated ambulation well without complaint or incident.

## 2020-11-19 NOTE — Plan of Care (Signed)

## 2020-11-19 NOTE — TOC Transition Note (Addendum)
Transition of Care [facility]) - CM/SW Discharge Note   Patient Details  Name: Ray Smith MRN: [id number] Date of Birth: July 15, 1945  Transition of [facility]) CM/SW Contact:  Gala Lewandowsky, RN Phone Number: 11/19/2020, 11:50 AM   Clinical Narrative: Case Manager spoke with patient regarding oxygen fill station. [employer] will try to arrange for a technician to meet the patient at the [facility]. Case Manager arranged for the patient to have an e-tank delivered to the room via [employer]. Patient asked if the hospital has found him housing. Patient has resources to call the Department of Social Services and [facility]. Social Work will assist with transportation to his [name] at the recycling center.   1319 Case Manager received consult for PCP- patient has an appointment at the [facility] scheduled. Unit Secretary will place on the AVS. Final next level of care: Home/Self Care Barriers to Discharge: Continued Medical Work up   Patient Goals and CMS Choice Patient states their goals for this hospitalization and ongoing recovery are:: living in [place]    Discharge Plan and Services In-house Referral: Clinical Social Work Discharge Planning Services: CM Consult Post Acute Care Choice: NA          DME Arranged: N/A DME Agency: NA     Social Determinants of Health (SDOH) Interventions Food Insecurity Interventions: Other (Comment) (Patient reports having food stamps but they decreased his monthly amound it was $250 but he isn't sure what it is now.) Financial Strain Interventions: Other (Comment) (Referral to [facility] outpatient clinic to meet with outpatient CSW) Housing Interventions: Other (Comment) (Patient reports he lives in his [name] and needs to plug in his breathing machine at night and can't afford gas to keep his vehicle running constantly.) Transportation Interventions: Other Orthoptist (Patient is living in his [name] and driving it but can't  always afford gas.)   Readmission Risk Interventions Readmission Risk Prevention Plan 11/17/2020  Transportation Screening Complete  PCP or Specialist Appt within 3-5 Days Complete  HRI or Home Care Consult Complete  Palliative Care Screening Not Applicable  Some recent data might be hidden

## 2020-11-19 NOTE — Progress Notes (Signed)
D/C instructions given and reviewed. Tele and IV removed, tolerated well. Awaiting TOC meds. 

## 2020-11-20 LAB — HEMOGLOBIN A1C
Hgb A1c MFr Bld: 5.6 % (ref 4.8–5.6)
Mean Plasma Glucose: 114 mg/dL

## 2020-11-26 ENCOUNTER — Inpatient Hospital Stay (HOSPITAL_COMMUNITY): Admit: 2020-11-26 | Discharge: 2020-11-26 | Disposition: A | Discharge status: Home / Self Care | Payer: Medicare HMO

## 2020-12-14 ENCOUNTER — Inpatient Hospital Stay: Payer: Medicare HMO | Admitting: Internal Medicine

## 2020-12-16 ENCOUNTER — Ambulatory Visit: Payer: Medicare HMO | Admitting: Family

## 2020-12-16 NOTE — Progress Notes (Deleted)
Office Visit    Patient Name: Ray Smith Date of Encounter: 12/16/2020  PCP:  Aviva Kluver   [facility]  Cardiologist:  Donato Schultz, MD  Advanced Practice Provider:  No care team member to display Electrophysiologist:  None   Chief Complaint    Ray Smith is a 75 y.o. male with a hx of chronic combined systolic and diastolic heart failure, CAD s/p prior PCI 2019 LM/LAD/Cx, ischemic cardiomyopathy, PAD with prior angioplasty of R SFA, COPD, hypertension, hyperlipidemia, aortic stenosis presents today for hospital follow-up  Past Medical History    Past Medical History:  Diagnosis Date   Acute ST elevation myocardial infarction (STEMI) involving left anterior descending (LAD) coronary artery (HCC) 12/30/2017   Acute systolic heart failure (HCC) 12/30/2017   COPD (chronic obstructive pulmonary disease) (HCC)    Coronary artery disease    a.  presented with CP and inf STE but no ACS - LHC (10/23/13):  Dist LM 40-60%, ostial LAD 80%, mid LAD 50%, ostial CFX 50-70%, mid RCA 50%.  EF 60%. - CP not felt to be ischemic; CABG vs Med Rx d/w pt - pt opted for Med Rx   History of kidney stones    "more than 20" (01/09/2018)   Hyperlipidemia    Hypertension    Marijuana abuse    MI (mitral incompetence)    Tobacco dependence    Past Surgical History:  Procedure Laterality Date   CARDIAC CATHETERIZATION     CORONARY/GRAFT ACUTE MI REVASCULARIZATION N/A 12/30/2017   Procedure: Coronary/Graft Acute MI Revascularization;  Surgeon: Tonny Bollman, MD;  Location: [facility] INVASIVE CV LAB;  Service: Cardiovascular;  Laterality: N/A;  Distal LM into LAD Xience [place] 3.5x67mm Ostial Cx Xience [place] 3.5x74mm   LEFT HEART CATH AND CORONARY ANGIOGRAPHY N/A 12/30/2017   Procedure: LEFT HEART CATH AND CORONARY ANGIOGRAPHY;  Surgeon: Tonny Bollman, MD;  Location: [facility] INVASIVE CV LAB;  Service: Cardiovascular;  Laterality: N/A;   LEFT HEART CATHETERIZATION WITH CORONARY ANGIOGRAM  Bilateral 10/23/2013   Procedure: LEFT HEART CATHETERIZATION WITH CORONARY ANGIOGRAM;  Surgeon: Lesleigh Noe, MD;  Location: [facility] CATH LAB;  Service: Cardiovascular;  Laterality: Bilateral;   snake bite surgery     TONSILLECTOMY  1955   TOTAL KNEE ARTHROPLASTY  2001    Allergies  Allergies  Allergen Reactions   Tramadol Anaphylaxis   Flexeril [Cyclobenzaprine] Other (See Comments)    Per patient "it made my heart stop"   Ibuprofen Other (See Comments)    Overuse damages pt's kidneys    Ibuprofen Nausea Only and Other (See Comments)    Reaction not recalled- perhaps made his stomach hurt   Lactose Intolerance (Gi) Other (See Comments)    Digestive issues    History of Present Illness    Ray Smith is a 75 y.o. male with a hx of *** last seen while hospitalized.  Admitted 11/02/2020 - 11/03/2020 due to acute on chronic respiratory failure due to acute on chronic systolic heart failure.  He underwent echocardiogram 11/03/2020 LVEF 25 to 30%, grade 2 diastolic dysfunction, RV SF mildly reduced, LA moderately dilated, RA mildly dilated, mild to moderate aortic valve stenosis with mean gradient 7 mmHg but suspected low-flow low gradient aortic stenosis with AVA 1.1 cm and DI 0.38.   He was readmitted 11/15/2020 - 11/19/2020 with heart failure exacerbation.  Due to elevated troponins he underwent nuclear stress test 11/18/2020 which was high risk due to scar and EF but  no reversible ischemia noted.  Cardiac catheterization was not pursued.  His GDMT was optimized to include [place] as well as carvedilol, Lasix.  He unfortunately no showed his heart failure out reach clinic appointment 11/26/2020.  He presents today for follow-up. ***  EKGs/Labs/Other Studies Reviewed:   The following studies were reviewed today:  Nuclear medicine stress test 11/18/20: Perfusion: Markedly abnormal perfusion throughout the left ventricle. Large fixed defects involving the apical segments of  the left ventricle and the septal wall. Findings compatible with infarcts. There is evidence for extracardiac activity along the lateral wall on the rest images. No clear evidence for reversible ischemia.   Wall Motion: Diffuse severe hypokinesia throughout the left ventricle.   Left Ventricular Ejection Fraction: 17 %   End diastolic volume 262 ml   End systolic volume 216 ml   IMPRESSION: 1. Abnormal uptake throughout the left ventricle compatible with large infarcts. No clear evidence for reversible ischemia.   2. Severe diffuse hypokinesia throughout the left ventricle.   3. Left ventricular ejection fraction is 17%.   4. Non invasive risk stratification*: High     Echo 11/03/20: 1. Left ventricular ejection fraction, by estimation, is 25 to 30%. The  left ventricle has severely decreased function. The left ventricle  demonstrates global hypokinesis. The left ventricular internal cavity size  was moderately dilated. Left  ventricular diastolic parameters are consistent with Grade II diastolic  dysfunction (pseudonormalization). Elevated left atrial pressure.   2. Right ventricular systolic function is mildly reduced. The right  ventricular size is normal. Tricuspid regurgitation signal is inadequate  for assessing PA pressure.   3. Left atrial size was moderately dilated.   4. Right atrial size was mildly dilated.   5. The mitral valve is normal in structure. No evidence of mitral valve  regurgitation.   6. The aortic valve was not well visualized. Aortic valve regurgitation  is not visualized. Mild to moderate aortic valve stenosis. Mean gradient  only , but suspect low flow low gradient aortic stenosis, AVA (1.1  cm^2) and DI (0.38) suggest moderate   AS   7. The inferior vena cava is normal in size with greater than 50%  respiratory variability, suggesting right atrial pressure of 3 mmHg.  LHC 2019  Mid RCA lesion is 70% stenosed. Mid LM to Dist LM lesion  is 90% stenosed. Ost LAD to Prox LAD lesion is 100% stenosed. Ost Cx to Prox Cx lesion is 90% stenosed. Prox Cx to Mid Cx lesion is 50% stenosed. Prox LAD to Mid LAD lesion is 40% stenosed. Mid LAD lesion is 50% stenosed. Ost 1st Diag to 1st Diag lesion is 40% stenosed. A drug-eluting stent was successfully placed using a STENT SIERRA 3.50 X 33 MM. Post intervention, there is a 0% residual stenosis. Post intervention, there is a 0% residual stenosis. A drug-eluting stent was successfully placed using a STENT SIERRA 3.50 X 23 MM. Post intervention, there is a 0% residual stenosis. There is severe left ventricular systolic dysfunction. LV end diastolic pressure is normal. The left ventricular ejection fraction is 25-35% by visual estimate.   1.  Severe distal left main stenosis 2.  Acute total occlusion of the proximal LAD 3.  Severe ostial left circumflex stenosis 4.  Moderate mid RCA stenosis 5.  Severe segmental LV systolic dysfunction consistent with large anterolateral infarction with LVEF estimated at 25% 6.  Successful complex PCI of the left mainstem/proximal LAD/proximal circumflex as outlined above   Recommend dual antiplatelet  therapy with Aspirin 81mg  daily and Clopidogrel 75mg  daily long-term (beyond 12 months) because of complex left main bifurcation PCI in the setting of anterior MI.  EKG:  EKG is  ordered today.  The ekg ordered today demonstrates ***  Recent Labs: 11/15/2020: B Natriuretic Peptide 3,315.3 11/16/2020: ALT 14; TSH 2.618 11/18/2020: Magnesium 2.0 11/19/2020: BUN 28; Creatinine, Ser 1.10; Hemoglobin 12.4; Platelets 234; Potassium 4.0; Sodium 138  Recent Lipid Panel    Component Value Date/Time   CHOL 117 11/19/2020 1032   TRIG 85 11/19/2020 1032   HDL 43 11/19/2020 1032   CHOLHDL 2.7 11/19/2020 1032   VLDL 17 11/19/2020 1032   LDLCALC 57 11/19/2020 1032   Home Medications   No outpatient medications have been marked as taking for the 12/16/20  encounter (Appointment) with 11/21/2020, NP.     Review of Systems   All other systems reviewed and are otherwise negative except as noted above.  Physical Exam    VS:  There were no vitals taken for this visit. , BMI There is no height or weight on file to calculate BMI.  Wt Readings from Last 3 Encounters:  11/19/20 110 lb 9.6 oz (50.2 kg)  11/03/20 109 lb 12.6 oz (49.8 kg)  04/28/20 128 lb (58.1 kg)     GEN: Well nourished, well developed, in no acute distress. HEENT: normal. Neck: Supple, no JVD, carotid bruits, or masses. Cardiac: ***RRR, no murmurs, rubs, or gallops. No clubbing, cyanosis, edema.  ***Radials/DP/PT 2+ and equal bilaterally.  Respiratory:  ***Respirations regular and unlabored, clear to auscultation bilaterally. GI: Soft, nontender, nondistended. MS: No deformity or atrophy. Skin: Warm and dry, no rash. Neuro:  Strength and sensation are intact. Psych: Normal affect.  Assessment & Plan    CAD s/p PCI 2019 to LM/LAD/circumflex-  Ischemic cardiomyopathy/chronic combined systolic and diastolic heart failure-  Mild to moderate aortic stenosis  Disposition: Follow up {follow up:15908} with Dr. 13/03/21 or APP  Signed, 2020, NP 12/16/2020, 7:57 AM [facility]

## 2021-01-06 ENCOUNTER — Emergency Department (HOSPITAL_COMMUNITY)
Admission: EM | Admit: 2021-01-06 | Discharge: 2021-01-06 | Disposition: A | Discharge status: Home / Self Care | Payer: Medicare HMO | Attending: Emergency Medicine | Admitting: Emergency Medicine

## 2021-01-06 ENCOUNTER — Encounter (HOSPITAL_COMMUNITY): Payer: Self-pay

## 2021-01-06 ENCOUNTER — Other Ambulatory Visit: Payer: Self-pay

## 2021-01-06 DIAGNOSIS — J449 Chronic obstructive pulmonary disease, unspecified: Secondary | ICD-10-CM | POA: Diagnosis not present

## 2021-01-06 DIAGNOSIS — I130 Hypertensive heart and chronic kidney disease with heart failure and stage 1 through stage 4 chronic kidney disease, or unspecified chronic kidney disease: Secondary | ICD-10-CM | POA: Diagnosis not present

## 2021-01-06 DIAGNOSIS — N182 Chronic kidney disease, stage 2 (mild): Secondary | ICD-10-CM | POA: Insufficient documentation

## 2021-01-06 DIAGNOSIS — I5021 Acute systolic (congestive) heart failure: Secondary | ICD-10-CM | POA: Insufficient documentation

## 2021-01-06 DIAGNOSIS — Z760 Encounter for issue of repeat prescription: Secondary | ICD-10-CM | POA: Diagnosis not present

## 2021-01-06 DIAGNOSIS — Z7982 Long term (current) use of aspirin: Secondary | ICD-10-CM | POA: Insufficient documentation

## 2021-01-06 DIAGNOSIS — Z79899 Other long term (current) drug therapy: Secondary | ICD-10-CM | POA: Insufficient documentation

## 2021-01-06 DIAGNOSIS — Z87891 Personal history of nicotine dependence: Secondary | ICD-10-CM | POA: Insufficient documentation

## 2021-01-06 DIAGNOSIS — I2510 Atherosclerotic heart disease of native coronary artery without angina pectoris: Secondary | ICD-10-CM | POA: Diagnosis not present

## 2021-01-06 DIAGNOSIS — Z955 Presence of coronary angioplasty implant and graft: Secondary | ICD-10-CM | POA: Diagnosis not present

## 2021-01-06 DIAGNOSIS — Z7951 Long term (current) use of inhaled steroids: Secondary | ICD-10-CM | POA: Diagnosis not present

## 2021-01-06 MED ORDER — CLOPIDOGREL BISULFATE 75 MG PO TABS: 75.0000 mg | ORAL_TABLET | Freq: Every day | ORAL | 1 refills | Status: DC

## 2021-01-06 MED ORDER — CLOPIDOGREL BISULFATE 75 MG PO TABS
75.0000 mg | ORAL_TABLET | Freq: Once | ORAL | Status: AC
  Administered 2021-01-06: 75 mg via ORAL
  Filled 2021-01-06: qty 1

## 2021-01-06 NOTE — Discharge Instructions (Addendum)
Please see the phone number for the community [facility] and wellness clinic listed above.  They will be able to follow you and refill your medications.  I have given you 3 months of your medication as well as a refill for another 3 months.

## 2021-01-06 NOTE — ED Notes (Signed)
[facility] called  

## 2021-01-06 NOTE — ED Provider Notes (Signed)
[facility] EMERGENCY DEPARTMENT Provider Note   CSN: [id number] Arrival date & time: 01/06/21  6073     History Chief Complaint  Patient presents with   Medication Refill    Ray Smith is a 75 y.o. male.   Medication Refill  This patient is a 75 year old male with a known history of coronary disease, he has had prior myocardial infarction, prior stenting, he also has COPD and has been admitted to the hospital multiple times in the past for respiratory related complaints mostly from his COPD.  He presents to the hospital today stating that he is out of his Plavix, he has been out of it for approximately 1 week since he was discharged from an outside hospital where he was admitted for COPD.  The patient reports that he is supposed to be taking the Plavix because of his heart, he has not had it, he has been calling the ER where he was admitted and they have not been able to refill it.  He does not have a primary care physician.  He has no complaints, no physical complaints, he is just worried that he has not had his medication.  Specifically he is not swelling is not short of breath he is not having chest pain he is not having fevers  Past Medical History:  Diagnosis Date   Acute ST elevation myocardial infarction (STEMI) involving left anterior descending (LAD) coronary artery (HCC) 12/30/2017   Acute systolic heart failure (HCC) 12/30/2017   COPD (chronic obstructive pulmonary disease) (HCC)    Coronary artery disease    a.  presented with CP and inf STE but no ACS - LHC (10/23/13):  Dist LM 40-60%, ostial LAD 80%, mid LAD 50%, ostial CFX 50-70%, mid RCA 50%.  EF 60%. - CP not felt to be ischemic; CABG vs Med Rx d/w pt - pt opted for Med Rx   History of kidney stones    "more than 20" (01/09/2018)   Hyperlipidemia    Hypertension    Marijuana abuse    MI (mitral incompetence)    Tobacco dependence     Patient Active Problem List   Diagnosis Date Noted    Acute decompensated heart failure (HCC) 11/02/2020   PVD (peripheral vascular disease) (HCC) 11/02/2020   COPD with acute exacerbation (HCC) 09/03/2019   Acute respiratory failure (HCC) 09/03/2019   Acute exacerbation of CHF (congestive heart failure) (HCC) 09/02/2019   Adjustment disorder with mixed disturbance of emotions and conduct    Protein-calorie malnutrition, severe 01/10/2018   Orthostatic hypotension 01/09/2018   Weakness 01/09/2018   Mouth pain 01/09/2018   CKD (chronic kidney disease) stage 2, GFR 60-89 ml/min 01/09/2018   Marijuana abuse 01/09/2018   Acute systolic heart failure (HCC) 01/02/2018   STEMI (ST elevation myocardial infarction) (HCC) 12/30/2017   STEMI involving left anterior descending coronary artery (HCC) 12/30/2017   Acute encephalopathy 10/06/2017   AKI (acute kidney injury) (HCC)    Altered mental status    Hypertension 11/12/2013   Dyslipidemia 10/24/2013   ACS (acute coronary syndrome) (HCC) 10/23/2013   Chest pain 10/23/2013   Coronary Artery Disease 10/23/2013   COPD (chronic obstructive pulmonary disease) (HCC) 10/23/2013   Tobacco abuse 10/23/2013   Precordial pain 10/23/2013    Past Surgical History:  Procedure Laterality Date   CARDIAC CATHETERIZATION     CORONARY/GRAFT ACUTE MI REVASCULARIZATION N/A 12/30/2017   Procedure: Coronary/Graft Acute MI Revascularization;  Surgeon: Tonny Bollman, MD;  Location: [facility] INVASIVE  CV LAB;  Service: Cardiovascular;  Laterality: N/A;  Distal LM into LAD Xience [place] 3.5x38mm Ostial Cx Xience [place] 3.5x16mm   LEFT HEART CATH AND CORONARY ANGIOGRAPHY N/A 12/30/2017   Procedure: LEFT HEART CATH AND CORONARY ANGIOGRAPHY;  Surgeon: Tonny Bollman, MD;  Location: [facility] INVASIVE CV LAB;  Service: Cardiovascular;  Laterality: N/A;   LEFT HEART CATHETERIZATION WITH CORONARY ANGIOGRAM Bilateral 10/23/2013   Procedure: LEFT HEART CATHETERIZATION WITH CORONARY ANGIOGRAM;  Surgeon: Lesleigh Noe, MD;  Location: [facility]  CATH LAB;  Service: Cardiovascular;  Laterality: Bilateral;   snake bite surgery     TONSILLECTOMY  1955   TOTAL KNEE ARTHROPLASTY  2001       Family History  Problem Relation Age of Onset   Heart disease Mother    Alzheimer's disease Father    Multiple sclerosis Sister    Diabetes Maternal Grandmother    Drug abuse Other     Social History   Tobacco Use   Smoking status: Former   Smokeless tobacco: Never   Tobacco comments:    Last smoked 20 years ago  Substance Use Topics   Alcohol use: Not Currently    Comment: Not in 21 days   Drug use: Never    Types: Marijuana    Home Medications Prior to Admission medications   Medication Sig Start Date End Date Taking? Authorizing Provider  albuterol (PROVENTIL) (2.5 MG/3ML) 0.083% nebulizer solution Use 3 mLs (2.5 mg total) by nebulization every 6 (six) hours as needed for wheezing or shortness of breath. 11/19/20   Aslam, Leanna Sato, MD  albuterol (VENTOLIN HFA) 108 (90 Base) MCG/ACT inhaler Inhale 2 puffs into the lungs every 6 (six) hours as needed for wheezing or shortness of breath. 11/03/20   Eliezer Bottom, MD  aspirin 81 MG EC tablet Take 1 tablet (81 mg total) by mouth daily. 11/03/20   Eliezer Bottom, MD  atorvastatin (LIPITOR) 80 MG tablet Take 1 tablet (80 mg total) by mouth daily at 6 PM. 11/03/20   Eliezer Bottom, MD  carvedilol (COREG) 6.25 MG tablet Take 1 tablet (6.25 mg total) by mouth 2 (two) times daily with a meal. 11/19/20 12/19/20  Eliezer Bottom, MD  clopidogrel (PLAVIX) 75 MG tablet Take 1 tablet (75 mg total) by mouth daily. 01/06/21 04/06/21  Eber Hong, MD  dapagliflozin propanediol (FARXIGA) 10 MG TABS tablet Take 1 tablet (10 mg total) by mouth daily. 11/20/20   Eliezer Bottom, MD  furosemide (LASIX) 40 MG tablet Take 1 tablet (40 mg total) by mouth daily. 11/20/20 12/20/20  Eliezer Bottom, MD  ipratropium (ATROVENT) 0.03 % nasal spray Place 2 sprays into both nostrils every 12 (twelve) hours. 11/03/20   Eliezer Bottom, MD   nitroGLYCERIN (NITROSTAT) 0.4 MG SL tablet Place 1 tablet (0.4 mg total) under the tongue every 5 (five) minutes x 3 doses as needed for chest pain. 11/03/20   Eliezer Bottom, MD  OXYGEN Inhale 5 L/min into the lungs at bedtime as needed (for shortness of breath).    [provider]  pantoprazole (PROTONIX) 40 MG tablet TAKE 1 TABLET BY MOUTH EVERYDAY AT BEDTIME Patient taking differently: Take 40 mg by mouth at bedtime. 11/03/20   Eliezer Bottom, MD  Tiotropium Bromide-Olodaterol (STIOLTO RESPIMAT) 2.5-2.5 MCG/ACT AERS INHALE 2 PUFFS BY MOUTH INTO THE LUNGS DAILY Patient taking differently: Inhale 2 puffs into the lungs daily. 11/03/20   Eliezer Bottom, MD    Allergies    Tramadol, Flexeril [cyclobenzaprine], Ibuprofen, Ibuprofen, and Lactose intolerance (gi)  Review of Systems   Review of Systems  All other systems reviewed and are negative.  Physical Exam Updated Vital Signs BP (!) 133/95 (BP Location: Right Arm)   Pulse 79   Temp 97.8 F (36.6 C) (Oral)   Resp 16   SpO2 100%   Physical Exam Vitals and nursing note reviewed.  Constitutional:      General: He is not in acute distress.    Appearance: He is well-developed.  HENT:     Head: Normocephalic and atraumatic.     Mouth/Throat:     Pharynx: No oropharyngeal exudate.  Eyes:     General: No scleral icterus.       Right eye: No discharge.        Left eye: No discharge.     Conjunctiva/sclera: Conjunctivae normal.     Pupils: Pupils are equal, round, and reactive to light.  Neck:     Thyroid: No thyromegaly.     Vascular: No JVD.  Cardiovascular:     Rate and Rhythm: Normal rate and regular rhythm.     Heart sounds: Normal heart sounds. No murmur heard.   No friction rub. No gallop.  Pulmonary:     Effort: Pulmonary effort is normal. No respiratory distress.     Breath sounds: Normal breath sounds. No wheezing or rales.  Abdominal:     General: Bowel sounds are normal. There is no distension.     Palpations:  Abdomen is soft. There is no mass.     Tenderness: There is no abdominal tenderness.  Musculoskeletal:        General: No tenderness. Normal range of motion.     Cervical back: Normal range of motion and neck supple.  Lymphadenopathy:     Cervical: No cervical adenopathy.  Skin:    General: Skin is warm and dry.     Findings: No erythema or rash.  Neurological:     Mental Status: He is alert.     Coordination: Coordination normal.  Psychiatric:        Behavior: Behavior normal.    ED Results / Procedures / Treatments   Labs (all labs ordered are listed, but only abnormal results are displayed) Labs Reviewed - No data to display  EKG None  Radiology No results found.  Procedures Procedures   Medications Ordered in ED Medications  clopidogrel (PLAVIX) tablet 75 mg (has no administration in time range)    ED Course  I have reviewed the triage vital signs and the nursing notes.  Pertinent labs & imaging results that were available during my care of the patient were reviewed by me and considered in my medical decision making (see chart for details).    MDM Rules/Calculators/A&P                          The patient will be given a list of local primaries including the community health and wellness clinic.  I will refill his Plavix.  He will be given a dose here.  He has no other complaints no other concerns and his vital signs are reassuring  Final Clinical Impression(s) / ED Diagnoses Final diagnoses:  Medication refill    Rx / DC Orders ED Discharge Orders          Ordered    clopidogrel (PLAVIX) 75 MG tablet  Daily        01/06/21 0827  Eber Hong, MD 01/06/21 [phone]
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
## 2021-02-14 ENCOUNTER — Other Ambulatory Visit: Payer: Self-pay

## 2021-02-14 ENCOUNTER — Encounter: Payer: Self-pay | Admitting: Family

## 2021-02-14 ENCOUNTER — Ambulatory Visit (INDEPENDENT_AMBULATORY_CARE_PROVIDER_SITE_OTHER): Payer: 59 | Admitting: Family

## 2021-02-14 VITALS — BP 120/80 | HR 82 | Temp 97.3°F | Resp 16 | Ht 70.0 in | Wt 108.6 lb

## 2021-02-14 DIAGNOSIS — Z7689 Persons encountering health services in other specified circumstances: Secondary | ICD-10-CM | POA: Diagnosis not present

## 2021-02-14 DIAGNOSIS — I130 Hypertensive heart and chronic kidney disease with heart failure and stage 1 through stage 4 chronic kidney disease, or unspecified chronic kidney disease: Secondary | ICD-10-CM

## 2021-02-14 DIAGNOSIS — Z1159 Encounter for screening for other viral diseases: Secondary | ICD-10-CM

## 2021-02-14 DIAGNOSIS — I2510 Atherosclerotic heart disease of native coronary artery without angina pectoris: Secondary | ICD-10-CM | POA: Insufficient documentation

## 2021-02-14 DIAGNOSIS — E43 Unspecified severe protein-calorie malnutrition: Secondary | ICD-10-CM

## 2021-02-14 DIAGNOSIS — Z87891 Personal history of nicotine dependence: Secondary | ICD-10-CM

## 2021-02-14 DIAGNOSIS — R634 Abnormal weight loss: Secondary | ICD-10-CM

## 2021-02-14 DIAGNOSIS — I5022 Chronic systolic (congestive) heart failure: Secondary | ICD-10-CM

## 2021-02-14 DIAGNOSIS — J449 Chronic obstructive pulmonary disease, unspecified: Secondary | ICD-10-CM

## 2021-02-14 DIAGNOSIS — N182 Chronic kidney disease, stage 2 (mild): Secondary | ICD-10-CM

## 2021-02-14 DIAGNOSIS — K219 Gastro-esophageal reflux disease without esophagitis: Secondary | ICD-10-CM

## 2021-02-14 DIAGNOSIS — F1210 Cannabis abuse, uncomplicated: Secondary | ICD-10-CM

## 2021-02-14 DIAGNOSIS — I10 Essential (primary) hypertension: Secondary | ICD-10-CM | POA: Insufficient documentation

## 2021-02-14 DIAGNOSIS — I25118 Atherosclerotic heart disease of native coronary artery with other forms of angina pectoris: Secondary | ICD-10-CM

## 2021-02-14 DIAGNOSIS — I509 Heart failure, unspecified: Secondary | ICD-10-CM | POA: Insufficient documentation

## 2021-02-14 DIAGNOSIS — I739 Peripheral vascular disease, unspecified: Secondary | ICD-10-CM

## 2021-02-14 DIAGNOSIS — J310 Chronic rhinitis: Secondary | ICD-10-CM

## 2021-02-14 DIAGNOSIS — E785 Hyperlipidemia, unspecified: Secondary | ICD-10-CM

## 2021-02-14 MED ORDER — IPRATROPIUM BROMIDE 0.03 % NA SOLN: 2.0000 | Freq: Two times a day (BID) | NASAL | 12 refills | Status: DC

## 2021-02-14 MED ORDER — FUROSEMIDE 40 MG PO TABS
40.0000 mg | ORAL_TABLET | Freq: Every day | ORAL | 1 refills | Status: DC
Start: 2021-02-14 — End: 2021-04-25

## 2021-02-14 MED ORDER — MIRTAZAPINE 7.5 MG PO TABS: 7.5000 mg | ORAL_TABLET | Freq: Every day | ORAL | 0 refills | Status: DC

## 2021-02-14 MED ORDER — CLOPIDOGREL BISULFATE 75 MG PO TABS: 75.0000 mg | ORAL_TABLET | Freq: Every day | ORAL | 1 refills | Status: DC

## 2021-02-14 MED ORDER — PANTOPRAZOLE SODIUM 40 MG PO TBEC: DELAYED_RELEASE_TABLET | ORAL | 2 refills | Status: DC

## 2021-02-14 MED ORDER — ALBUTEROL SULFATE (2.5 MG/3ML) 0.083% IN NEBU: 2.5000 mg | INHALATION_SOLUTION | Freq: Four times a day (QID) | RESPIRATORY_TRACT | 5 refills | Status: DC | PRN

## 2021-02-14 MED ORDER — ALBUTEROL SULFATE HFA 108 (90 BASE) MCG/ACT IN AERS: 2.0000 | INHALATION_SPRAY | Freq: Four times a day (QID) | RESPIRATORY_TRACT | 6 refills | Status: DC | PRN

## 2021-02-14 MED ORDER — CARVEDILOL 6.25 MG PO TABS: 6.2500 mg | ORAL_TABLET | Freq: Two times a day (BID) | ORAL | 1 refills | Status: DC

## 2021-02-14 MED ORDER — ATORVASTATIN CALCIUM 80 MG PO TABS: 80.0000 mg | ORAL_TABLET | Freq: Every day | ORAL | 1 refills | Status: DC

## 2021-02-14 MED ORDER — STIOLTO RESPIMAT 2.5-2.5 MCG/ACT IN AERS: INHALATION_SPRAY | RESPIRATORY_TRACT | 6 refills | Status: DC

## 2021-02-14 MED ORDER — DAPAGLIFLOZIN PROPANEDIOL 10 MG PO TABS: 10.0000 mg | ORAL_TABLET | Freq: Every day | ORAL | 1 refills | Status: DC

## 2021-02-14 NOTE — Patient Instructions (Signed)
Drink Ensure one can by mouth daily

## 2021-02-14 NOTE — Progress Notes (Signed)
Provider: Marlowe Sax FNP-C   Marsa Matteo, Nelda Bucks, NP  Patient Care Team: Nevea Spiewak, Nelda Bucks, NP as PCP - General (Family Medicine) Jerline Pain, MD as PCP - Cardiology (Cardiology) Patient, No Pcp Per (Inactive) (General Practice)  Extended Emergency Contact Information Primary Emergency Contact: Tedrow Mobile Phone: [phone] Relation: Friend Secondary Emergency Contact: Jones,Fanny Address: [address]          [place] [address] [place] Phone: [phone] Relation: Relative  Code Status:  Full Code  Goals of care: Advanced Directive information Advanced Directives 02/14/2021  Does Patient Have a Medical Advance Directive? No  Would patient like information on creating a medical advance directive? No - Patient declined  Pre-existing out of facility DNR order (yellow form or pink MOST form) -     Chief Complaint  Patient presents with   Establish Care    New Patient.    HPI:  Pt is a 75 y.o. male seen today to establish care here at [facility] for medical management of chronic diseases.Has a medical history of  Hypertension,CAD s/p PCI/DES to left main in 3202,BXIDHWY systolic congestive heart failure,PVD status post angioplasty in 2019,COPD on chronic 5 liters supplemental oxygen at bedtime,CKD stage 2, weight loss,GERD,Hyperlipidemia,Marijuana abuse,hx of Tobacco dependence among other conditions. He states needs all medication refilled.Has had no PCP in many years usually goes to ED for medication to be refilled. On chart review he was last in ED 01/06/2021 for medication refill. Has multiple Hospitalization prior to this.  He denies any acute issues today.He states just excited to have a PCP who will be refilling his medication and don't have to miss taking meds. He follows up with Cardiology but looks like he missed his appointment No show.unclear why.   Denies chest pain, shortness of breath, lower extremity  edema, fatigue, palpitations, weakness, presyncope, syncope, orthopnea, and PND.  States checks his weight daily at home weighs around 108 - 109 lbs.States was down to 99 lbs.tries to eat several times per day but does not seem to gain any weight.states eats one meal and other small other frequent food.  States needs new dentures lost both dentures in his recent hospitalization. States dose exercise every day.  Admits to smoking "One Joint " marijuana every now and then.   He lives by himself with his dog in a mobile [place].    Past Medical History:  Diagnosis Date   Acute ST elevation myocardial infarction (STEMI) involving left anterior descending (LAD) coronary artery ([place]) 61/68/3729   Acute systolic heart failure (HCC) 12/30/2017   Congestive heart failure (CHF) (HCC)    COPD (chronic obstructive pulmonary disease) (HCC)    Coronary artery disease    a.  presented with CP and inf STE but no ACS - LHC (10/23/13):  Dist LM 40-60%, ostial LAD 80%, mid LAD 50%, ostial CFX 50-70%, mid RCA 50%.  EF 60%. - CP not felt to be ischemic; CABG vs Med Rx d/w pt - pt opted for Med Rx   History of kidney stones    "more than 20" (01/09/2018)   Hyperlipidemia    Hypertension    Marijuana abuse    MI (mitral incompetence)    Tobacco dependence    Past Surgical History:  Procedure Laterality Date   CARDIAC CATHETERIZATION     CORONARY/GRAFT ACUTE MI REVASCULARIZATION N/A 12/30/2017   Procedure: Coronary/Graft Acute MI Revascularization;  Surgeon: Sherren Mocha, MD;  Location: [facility];  Service: Cardiovascular;  Laterality: N/A;  Distal LM into LAD Xience SIerra 3.5x64m Ostial Cx Xience [place]3.5x256m  LEFT HEART CATH AND CORONARY ANGIOGRAPHY N/A 12/30/2017   Procedure: LEFT HEART CATH AND CORONARY ANGIOGRAPHY;  Surgeon: CoSherren MochaMD;  [facility];  Service: Cardiovascular;  Laterality: N/A;   LEFT HEART CATHETERIZATION WITH CORONARY ANGIOGRAM Bilateral 10/23/2013    Procedure: LEFT HEART CATHETERIZATION WITH CORONARY ANGIOGRAM;  Surgeon: HeSinclair GroomsMD;  [facility];  Service: Cardiovascular;  Laterality: Bilateral;   snake bite surgery     [place]  2001    Allergies  Allergen Reactions   Tramadol Anaphylaxis   Flexeril [Cyclobenzaprine] Other (See Comments)    Per patient "it made my heart stop"   Ibuprofen Other (See Comments)    Overuse damages pt's kidneys    Ibuprofen Nausea Only and Other (See Comments)    Reaction not recalled- perhaps made his stomach hurt   Lactose Intolerance (Gi) Other (See Comments)    Digestive issues    Allergies as of 02/14/2021       Reactions   Tramadol Anaphylaxis   Flexeril [cyclobenzaprine] Other (See Comments)   Per patient "it made my heart stop"   Ibuprofen Other (See Comments)   Overuse damages pt's kidneys   Ibuprofen Nausea Only, Other (See Comments)   Reaction not recalled- perhaps made his stomach hurt   Lactose Intolerance (gi) Other (See Comments)   Digestive issues        Medication List        Accurate as of February 14, 2021  2:28 PM. If you have any questions, ask your nurse or doctor.          albuterol (2.5 MG/3ML) 0.083% nebulizer solution Commonly known as: PROVENTIL Use 3 mLs (2.5 mg total) by nebulization every 6 (six) hours as needed for wheezing or shortness of breath.   albuterol 108 (90 Base) MCG/ACT inhaler Commonly known as: VENTOLIN HFA Inhale 2 puffs into the lungs every 6 (six) hours as needed for wheezing or shortness of breath.   aspirin 81 MG EC tablet Take 1 tablet (81 mg total) by mouth daily.   atorvastatin 80 MG tablet Commonly known as: LIPITOR Take 1 tablet (80 mg total) by mouth daily at 6 PM.   carvedilol 6.25 MG tablet Commonly known as: COREG Take 1 tablet (6.25 mg total) by mouth 2 (two) times daily with a meal.   clopidogrel 75 MG tablet Commonly known as: PLAVIX Take 1 tablet  (75 mg total) by mouth daily.   dapagliflozin propanediol 10 MG Tabs tablet Commonly known as: FARXIGA Take 1 tablet (10 mg total) by mouth daily.   furosemide 40 MG tablet Commonly known as: LASIX Take 1 tablet (40 mg total) by mouth daily.   ipratropium 0.03 % nasal spray Commonly known as: ATROVENT Place 2 sprays into both nostrils every 12 (twelve) hours.   mirtazapine 7.5 MG tablet Commonly known as: REMERON Take 1 tablet (7.5 mg total) by mouth at bedtime. Started by: DiSandrea HughsNP   nitroGLYCERIN 0.4 MG SL tablet Commonly known as: NITROSTAT Place 1 tablet (0.4 mg total) under the tongue every 5 (five) minutes x 3 doses as needed for chest pain.   OXYGEN Inhale 5 L/min into the lungs at bedtime as needed (for shortness of breath).   pantoprazole 40 MG tablet Commonly known as: PROTONIX TAKE 1 TABLET BY  MOUTH EVERYDAY AT BEDTIME   Stiolto Respimat 2.5-2.5 MCG/ACT Aers Generic drug: Tiotropium Bromide-Olodaterol INHALE 2 PUFFS BY MOUTH INTO THE LUNGS DAILY        Review of Systems  Constitutional:  Positive for appetite change and unexpected weight change. Negative for chills, fatigue and fever.  HENT:  Positive for hearing loss. Negative for congestion, dental problem, ear discharge, ear pain, facial swelling, nosebleeds, postnasal drip, rhinorrhea, sinus pressure, sinus pain, sneezing, sore throat, tinnitus and trouble swallowing.   Eyes:  Negative for pain, discharge, redness, itching and visual disturbance.  Respiratory:  Negative for cough, chest tightness and shortness of breath.        COPD on 5 liters at bedtime   Cardiovascular:  Negative for chest pain, palpitations and leg swelling.  Gastrointestinal:  Negative for abdominal distention, abdominal pain, blood in stool, constipation, diarrhea, nausea and vomiting.  Endocrine: Negative for cold intolerance, heat intolerance, polydipsia, polyphagia and polyuria.  Genitourinary:  Negative for difficulty  urinating, dysuria, flank pain, frequency and urgency.  Musculoskeletal:  Negative for arthralgias, back pain, gait problem, joint swelling, myalgias, neck pain and neck stiffness.  Skin:  Negative for color change, pallor, rash and wound.  Neurological:  Negative for dizziness, syncope, speech difficulty, weakness, light-headedness, numbness and headaches.  Hematological:  Does not bruise/bleed easily.  Psychiatric/Behavioral:  Negative for agitation, behavioral problems, confusion, hallucinations, self-injury, sleep disturbance and suicidal ideas. The patient is not nervous/anxious.    Immunization History  Administered Date(s) Administered   Tdap 12/22/2019   Pertinent  Health Maintenance Due  Topic Date Due   PNA vac Low Risk Adult (1 of 2 - PCV13) Never done   INFLUENZA VACCINE  01/24/2021   Fall Risk  02/14/2021  Falls in the past year? 0  Number falls in past yr: 0  Injury with Fall? 0  Risk for fall due to : No Fall Risks  Follow up Falls evaluation completed   Functional Status Survey:    Vitals:   02/14/21 1340  BP: 120/80  Pulse: 82  Resp: 16  Temp: (!) 97.3 F (36.3 C)  SpO2: 90%  Weight: 108 lb 9.6 oz (49.3 kg)  Height: _0  (1.778 m)   Body mass index is 15.58 kg/m. Physical Exam Vitals reviewed.  Constitutional:      General: He is not in acute distress.    Appearance: Normal appearance. He is cachectic. He is not ill-appearing or diaphoretic.  HENT:     Head: Normocephalic.     Right Ear: Tympanic membrane, ear canal and external ear normal. There is no impacted cerumen.     Left Ear: Tympanic membrane, ear canal and external ear normal. There is no impacted cerumen.     Nose: Nose normal. No congestion or rhinorrhea.     Mouth/Throat:     Mouth: Mucous membranes are moist.     Pharynx: Oropharynx is clear. No oropharyngeal exudate or posterior oropharyngeal erythema.  Eyes:     General: No scleral icterus.       Right eye: No discharge.         Left eye: No discharge.     Extraocular Movements: Extraocular movements intact.     Conjunctiva/sclera: Conjunctivae normal.     Pupils: Pupils are equal, round, and reactive to light.  Neck:     Vascular: No carotid bruit.  Cardiovascular:     Rate and Rhythm: Normal rate and regular rhythm.     Pulses: Normal pulses.  Heart sounds: Normal heart sounds. No murmur heard.   No friction rub. No gallop.  Pulmonary:     Effort: Pulmonary effort is normal. No respiratory distress.     Breath sounds: Normal breath sounds. No wheezing, rhonchi or rales.  Chest:     Chest wall: No tenderness.  Abdominal:     General: Bowel sounds are normal. There is no distension.     Palpations: Abdomen is soft. There is no mass.     Tenderness: There is no abdominal tenderness. There is no right CVA tenderness, left CVA tenderness, guarding or rebound.  Musculoskeletal:        General: No swelling or tenderness. Normal range of motion.     Cervical back: Normal range of motion. No rigidity or tenderness.     Right lower leg: No edema.     Left lower leg: No edema.  Lymphadenopathy:     Cervical: No cervical adenopathy.  Skin:    General: Skin is warm and dry.     Coloration: Skin is not pale.     Findings: No bruising, erythema, lesion or rash.  Neurological:     Mental Status: He is alert and oriented to person, place, and time.     Cranial Nerves: No cranial nerve deficit.     Sensory: No sensory deficit.     Motor: No weakness.     Coordination: Coordination normal.     Gait: Gait normal.  Psychiatric:        Mood and Affect: Mood normal.        Speech: Speech normal.        Behavior: Behavior normal.        Thought Content: Thought content normal.        Judgment: Judgment normal.    Labs reviewed: Recent Labs    11/15/20 1642 11/16/20 0349 11/17/20 0205 11/18/20 0328 11/19/20 1032  NA  --    < > 139 139 138  K  --    < > 4.1 4.2 4.0  CL  --    < > 104 102 102  CO2  --    <  > _0 GLUCOSE  --    < > 89 98 88  BUN  --    < > 33* 34* 28*  CREATININE  --    < > 1.37* 1.34* 1.10  CALCIUM  --    < > 8.8* 8.7* 8.9  MG 2.2  --  1.9 2.0  --    < > = values in this interval not displayed.   Recent Labs    11/02/20 0659 11/03/20 0310 11/16/20 0349  AST 42* 20 16  ALT _1 ALKPHOS 167* 133* 115  BILITOT 0.9 1.0 1.0  PROT 6.0* 5.3* 5.5*  ALBUMIN 3.0* 2.9* 3.0*   Recent Labs    11/02/20 0659 11/15/20 1150 11/16/20 0349 11/18/20 0328 11/19/20 1032  WBC 9.4 8.7 10.8* 9.6 9.0  NEUTROABS 7.7 7.2  --   --   --   HGB 12.7* 12.5* 12.8* 11.7* 12.4*  HCT 40.5 39.6 40.2 36.8* 39.6  MCV 96.4 95.4 94.6 95.8 95.9  PLT 204 250 239 203 234   Lab Results  Component Value Date   TSH 2.618 11/16/2020   Lab Results  Component Value Date   HGBA1C 5.6 11/19/2020   Lab Results  Component Value Date   CHOL 117 11/19/2020   HDL 43 11/19/2020   LDLCALC  57 11/19/2020   TRIG 85 11/19/2020   CHOLHDL 2.7 11/19/2020    Significant Diagnostic Results in last 30 days:  No results found.  Assessment/Plan 1. Weight loss, abnormal - continue on Ensure one can by mouth daily  Start on mirtazapine as below.SE discussed.  - mirtazapine (REMERON) 7.5 MG tablet; Take 1 tablet (7.5 mg total) by mouth at bedtime.  Dispense: 30 tablet; Refill: 0  2. Chronic obstructive pulmonary disease, unspecified COPD type ([place]) Breathing stable. Continue on oxygen 5 liters at bedtime  - continue on Albuterol and ipratropium  - albuterol (PROVENTIL) (2.5 MG/3ML) 0.083% nebulizer solution; Use 3 mLs (2.5 mg total) by nebulization every 6 (six) hours as needed for wheezing or shortness of breath.  Dispense: 90 mL; Refill: 5 - albuterol (VENTOLIN HFA) 108 (90 Base) MCG/ACT inhaler; Inhale 2 puffs into the lungs every 6 (six) hours as needed for wheezing or shortness of breath.  Dispense: 8 g; Refill: 6 - ipratropium (ATROVENT) 0.03 % nasal spray; Place 2 sprays into both nostrils  every 12 (twelve) hours.  Dispense: 30 mL; Refill: 12 - Tiotropium Bromide-Olodaterol (STIOLTO RESPIMAT) 2.5-2.5 MCG/ACT AERS; INHALE 2 PUFFS BY MOUTH INTO THE LUNGS DAILY  Dispense: 1 each; Refill: 6  3. PVD (peripheral vascular disease) ([place]) S/p angioplasty in 2019  No ulceration - continue on antiplatelet Therapy Plavix and Atorvastatin   4. Chronic systolic congestive heart failure (HCC) No signs of fluid overload  - continue on Coreg, Furosemide and Farxiga  - carvedilol (COREG) 6.25 MG tablet; Take 1 tablet (6.25 mg total) by mouth 2 (two) times daily with a meal.  Dispense: 90 tablet; Refill: 1 - furosemide (LASIX) 40 MG tablet; Take 1 tablet (40 mg total) by mouth daily.  Dispense: 90 tablet; Refill: 1  5. CKD (chronic kidney disease) stage 2, GFR 60-89 ml/min Latest CR 1.10  continue to avoid Nephrotoxins and dose all other medication for renal clearance   6. Rhinitis, unspecified type Symptoms controlled on ipratropium  - ipratropium (ATROVENT) 0.03 % nasal spray; Place 2 sprays into both nostrils every 12 (twelve) hours.  Dispense: 30 mL; Refill: 12  7. Gastroesophageal reflux disease, unspecified whether esophagitis present Symptoms controlled.  Continue on Protonix  - pantoprazole (PROTONIX) 40 MG tablet; TAKE 1 TABLET BY MOUTH EVERYDAY AT BEDTIME  Dispense: 30 tablet; Refill: 2  8. Benign hypertensive heart and kidney disease with chronic kidney disease, stage 1 through stage 4 or unspecified chronic kidney disease, with heart failure (HCC) B/p well controlled  - continue on coreg and Furosemide   9. Atherosclerosis of native coronary artery of native heart with stable angina pectoris ([place]) Status post PCI/DES in 2019  - chest pain free  Has not required Nitroglycerin  Continue on Plavix and Atorvastatin  - continue to follow up with Cardiologist  - clopidogrel (PLAVIX) 75 MG tablet; Take 1 tablet (75 mg total) by mouth daily.  Dispense: 90 tablet; Refill: 1 - CBC  with Differential/Platelet; Future - CMP with eGFR([facility]); Future - TSH; Future  10. Hyperlipidemia LDL goal <70 Latest LDL at goal - atorvastatin (LIPITOR) 80 MG tablet; Take 1 tablet (80 mg total) by mouth daily at 6 PM.  Dispense: 90 tablet; Refill: 1 - Lipid panel; Future  11. Encounter to establish care - Will verify immunization due for PNA and shingles . Admister PNA on next visit with Flu shot.  - Medication and labs reviewed patient counselled regarding yearly exam, prevention of dental and periodontal disease, diet, regular  sustained exercise for at least 30 minutes x 3 /week, - COVID-19 hand hygiene, mask and social distancing per CDC guidelines.Advised to get his COVID-19 vaccine at his pharmacy. -  recommended schedule for routine labs will get labs done tomorrow or in one week.   12. Encounter for hepatitis C screening test for low risk patient Reports no high risk behaviors  - Hep C Antibody; Future  13. Protein-calorie malnutrition, severe Continue on Ensure protein supplement. - Advised to continue to monitor weight to notify provider for any weight loss or abrupt gain.   14. Marijuana abuse Reports smoking joint every once in a while. Cessation advised but not ready to quit   15. Former cigarette smoker Stopped smoking tobacco.    Pharmacist, hospital Communication: Reviewed plan of care with patient verbalized understanding   Labs/tests ordered:  - CBC with Differential/Platelet - CMP with eGFR([facility]) - TSH - Lipid panel - Hep C Antibody  Next Appointment : one month for weight check.one week or sooner for Fasting labs  Sandrea Hughs, NP

## 2021-02-15 ENCOUNTER — Other Ambulatory Visit: Payer: 59

## 2021-02-21 ENCOUNTER — Other Ambulatory Visit (HOSPITAL_COMMUNITY): Payer: Self-pay

## 2021-02-24 ENCOUNTER — Other Ambulatory Visit: Payer: 59

## 2021-02-24 DIAGNOSIS — E785 Hyperlipidemia, unspecified: Secondary | ICD-10-CM

## 2021-02-24 DIAGNOSIS — I25118 Atherosclerotic heart disease of native coronary artery with other forms of angina pectoris: Secondary | ICD-10-CM

## 2021-02-24 DIAGNOSIS — F1210 Cannabis abuse, uncomplicated: Secondary | ICD-10-CM

## 2021-03-18 ENCOUNTER — Other Ambulatory Visit: Payer: 59

## 2021-03-22 ENCOUNTER — Other Ambulatory Visit: Payer: Self-pay | Admitting: Family

## 2021-03-22 DIAGNOSIS — R634 Abnormal weight loss: Secondary | ICD-10-CM

## 2021-03-23 ENCOUNTER — Telehealth: Payer: Self-pay

## 2021-03-23 NOTE — Telephone Encounter (Addendum)
Incoming call received form patient stating he needs medication now sent to his pharmacy for shortness of breath and green mucous. I asked patient if he had access to a at home covid test and he said no and indicated he does not have covid he has shortness of breath and congestion. I tried to explain that he is having symptoms of covid, and that would need to be ruled out, by a at home test or he could be tested here, yet patient was over talking me.  I informed patient he would need to be seen, patient states he has no way to get anywhere as he has no transportation. I placed patient on a brief hold and consulted with NP Monina Medina-Vargas to see if patient could be worked in for a video/telephone visit and Monina said if the patient is having SOB he needs to go to the emergency room.   Call was resumed with patient, response from NP shared, patient yelled I can't breath and stated he would call 911, I agreed that was a good decision giving his circumstances of no transportation and symptoms.

## 2021-03-23 NOTE — Telephone Encounter (Signed)
Patient called back and stated that he needed medication called in for Congestion and SOB. Stated that he can't walk across the room without getting SOB.  Patient stated that he lives in an old hotel that was turned into apartments and he is afraid if he leave they will give his apartment away. Stated that he had to trade in his vehicle to afford to live there.   I reviewed Chrae's note and patient needs to call 911 to be evaluated since he cannot come into office. Instructed patient to call 911 again.   He stated that he will call.

## 2021-03-23 NOTE — Telephone Encounter (Signed)
Refill mucinex

## 2021-03-23 NOTE — Telephone Encounter (Signed)
[name] with EMS called and stated that 911 was called and they went out and evaluated patient. Stated that he told them that we were going to call in a Rx if they came out and evaluated.   Stated that patient does have audible Congestion. Left Lower Lobe Wheezing but clears up when coughs. No other symptoms noted.   Patient has relief with Mucinex , ran out, but requesting a Rx.   Vitals: BP 118/60, Pulse 58, SpO2: 97%  Patient requesting Telephone visit to get medication. Does not have MyChart or Smartphone.

## 2021-03-24 ENCOUNTER — Other Ambulatory Visit: Payer: Self-pay

## 2021-03-24 ENCOUNTER — Ambulatory Visit (INDEPENDENT_AMBULATORY_CARE_PROVIDER_SITE_OTHER): Payer: 59 | Admitting: Nurse Practitioner

## 2021-03-24 ENCOUNTER — Telehealth: Payer: Self-pay

## 2021-03-24 DIAGNOSIS — K5904 Chronic idiopathic constipation: Secondary | ICD-10-CM | POA: Diagnosis not present

## 2021-03-24 DIAGNOSIS — J449 Chronic obstructive pulmonary disease, unspecified: Secondary | ICD-10-CM

## 2021-03-24 DIAGNOSIS — J3089 Other allergic rhinitis: Secondary | ICD-10-CM | POA: Diagnosis not present

## 2021-03-24 MED ORDER — ALBUTEROL SULFATE (2.5 MG/3ML) 0.083% IN NEBU: 2.5000 mg | INHALATION_SOLUTION | Freq: Four times a day (QID) | RESPIRATORY_TRACT | 5 refills | Status: DC | PRN

## 2021-03-24 MED ORDER — CETIRIZINE HCL 10 MG PO TABS: 10.0000 mg | ORAL_TABLET | Freq: Every day | ORAL | 11 refills | Status: DC

## 2021-03-24 MED ORDER — POLYETHYLENE GLYCOL 3350 17 GM/SCOOP PO POWD: 17.0000 g | Freq: Every day | ORAL | 1 refills | Status: DC

## 2021-03-24 NOTE — Telephone Encounter (Signed)
Patient states he has one more breathing treatment then he will be out and he need it at the [place] [facility].

## 2021-03-24 NOTE — Progress Notes (Signed)
This service is provided via telemedicine  No vital signs collected/recorded due to the encounter was a telemedicine visit.   Location of patient (ex: home, work):  Home  Patient consents to a telephone visit:  Yes, see encounter dated 03/24/2021  Location of the provider (ex: office, home):  [facility]  Name of any referring provider:  Ngetich, Dinah, NP  Names of all persons participating in the telemedicine service and their role in the encounter:  Abbey Chatters, Nurse Practitioner, [name], CMA, and patient.   Time spent on call:  11 minutes with medical assistant

## 2021-03-24 NOTE — Telephone Encounter (Signed)
[name], lampe are scheduled for a virtual visit with your provider today.    Just as we do with appointments in the office, we must obtain your consent to participate.  Your consent will be active for this visit and any virtual visit you may have with one of our providers in the next 365 days.    If you have a MyChart account, I can also send a copy of this consent to you electronically.  All virtual visits are billed to your insurance company just like a traditional visit in the office.  As this is a virtual visit, video technology does not allow for your provider to perform a traditional examination.  This may limit your provider's ability to fully assess your condition.  If your provider identifies any concerns that need to be evaluated in person or the need to arrange testing such as labs, EKG, etc, we will make arrangements to do so.    Although advances in technology are sophisticated, we cannot ensure that it will always work on either your end or our end.  If the connection with a video visit is poor, we may have to switch to a telephone visit.  With either a video or telephone visit, we are not always able to ensure that we have a secure connection.   I need to obtain your verbal consent now.   Are you willing to proceed with your visit today?   Ray Smith has provided verbal consent on 03/24/2021 for a virtual visit (video or telephone).   [name], [facility] 03/24/2021  10:32 AM

## 2021-03-24 NOTE — Progress Notes (Signed)
Careteam: Patient Care Team: Ngetich, Donalee Citrin, NP as PCP - General (Family Medicine) Jake Bathe, MD as PCP - Cardiology (Cardiology) Patient, No Pcp Per (Inactive) (General Practice)  Advanced Directive information    Allergies  Allergen Reactions   Tramadol Anaphylaxis   Flexeril [Cyclobenzaprine] Other (See Comments)    Per patient "it made my heart stop"   Ibuprofen Other (See Comments)    Overuse damages pt's kidneys    Ibuprofen Nausea Only and Other (See Comments)    Reaction not recalled- perhaps made his stomach hurt   Lactose Intolerance (Gi) Other (See Comments)    Digestive issues    Chief Complaint  Patient presents with   Acute Visit    Patient complains of congestion. Patient has runny nose. Patient has had congestion for about a week. Patient has been taking mucinex. Has been coughing up green phlegm. Patient has not taking COVID test.      HPI: Patient is a 75 y.o. male reports he is having congestion in his sinuses.  Reports his run water constantly.  Also reports mucous.  Reports bad sinus allergies.  He was recommended medication and had no problems while he was on it.  Has trouble with clear drainage all year.  No cough or chest congestion after the mucinex.   No fever or body aches.   No headaches.  Eating and drinking well.  No shortness of breath or chest pains.   Using nasal spray but makes his nose run more.   Been living in a [place] but son has him in apartment/motel at this time.   Review of Systems:  Review of Systems  Constitutional:  Negative for chills, fever, malaise/fatigue and weight loss.  HENT:  Positive for congestion. Negative for hearing loss, sinus pain and sore throat.   Respiratory:  Positive for cough and sputum production. Negative for shortness of breath and wheezing.   Cardiovascular:  Negative for leg swelling.  Neurological:  Negative for headaches.  Endo/Heme/Allergies:  Positive for environmental  allergies.   Past Medical History:  Diagnosis Date   Acute ST elevation myocardial infarction (STEMI) involving left anterior descending (LAD) coronary artery (HCC) 12/30/2017   Acute systolic heart failure (HCC) 12/30/2017   Congestive heart failure (CHF) (HCC)    COPD (chronic obstructive pulmonary disease) (HCC)    Coronary artery disease    a.  presented with CP and inf STE but no ACS - LHC (10/23/13):  Dist LM 40-60%, ostial LAD 80%, mid LAD 50%, ostial CFX 50-70%, mid RCA 50%.  EF 60%. - CP not felt to be ischemic; CABG vs Med Rx d/w pt - pt opted for Med Rx   History of kidney stones    "more than 20" (01/09/2018)   Hyperlipidemia    Hypertension    Marijuana abuse    MI (mitral incompetence)    Tobacco dependence    Past Surgical History:  Procedure Laterality Date   CARDIAC CATHETERIZATION     CORONARY/GRAFT ACUTE MI REVASCULARIZATION N/A 12/30/2017   Procedure: Coronary/Graft Acute MI Revascularization;  Surgeon: Tonny Bollman, MD;  Location: [facility] INVASIVE CV LAB;  Service: Cardiovascular;  Laterality: N/A;  Distal LM into LAD Xience SIerra 3.5x73mm Ostial Cx Xience [place] 3.5x52mm   LEFT HEART CATH AND CORONARY ANGIOGRAPHY N/A 12/30/2017   Procedure: LEFT HEART CATH AND CORONARY ANGIOGRAPHY;  Surgeon: Tonny Bollman, MD;  Location: [facility] INVASIVE CV LAB;  Service: Cardiovascular;  Laterality: N/A;   LEFT HEART CATHETERIZATION WITH  CORONARY ANGIOGRAM Bilateral 10/23/2013   Procedure: LEFT HEART CATHETERIZATION WITH CORONARY ANGIOGRAM;  Surgeon: Lesleigh Noe, MD;  Location: [facility] CATH LAB;  Service: Cardiovascular;  Laterality: Bilateral;   snake bite surgery     TONSILLECTOMY  1955   TOTAL KNEE ARTHROPLASTY  2001   Social History:   reports that he has quit smoking. He has never used smokeless tobacco. He reports that he does not currently use alcohol. He reports that he does not currently use drugs after having used the following drugs: Marijuana.  Family History  Problem  Relation Age of Onset   Heart disease Mother    Alzheimer's disease Mother    Alzheimer's disease Father    Congestive Heart Failure Father    Multiple sclerosis Sister    Diabetes Maternal Grandmother    Drug abuse Other     Medications: Patient's Medications  New Prescriptions   No medications on file  Previous Medications   ALBUTEROL (PROVENTIL) (2.5 MG/3ML) 0.083% NEBULIZER SOLUTION    Use 3 mLs (2.5 mg total) by nebulization every 6 (six) hours as needed for wheezing or shortness of breath.   ALBUTEROL (VENTOLIN HFA) 108 (90 BASE) MCG/ACT INHALER    Inhale 2 puffs into the lungs every 6 (six) hours as needed for wheezing or shortness of breath.   ASPIRIN 81 MG EC TABLET    Take 1 tablet (81 mg total) by mouth daily.   ATORVASTATIN (LIPITOR) 80 MG TABLET    Take 1 tablet (80 mg total) by mouth daily at 6 PM.   CARVEDILOL (COREG) 6.25 MG TABLET    Take 1 tablet (6.25 mg total) by mouth 2 (two) times daily with a meal.   CLOPIDOGREL (PLAVIX) 75 MG TABLET    Take 1 tablet (75 mg total) by mouth daily.   DAPAGLIFLOZIN PROPANEDIOL (FARXIGA) 10 MG TABS TABLET    Take 1 tablet (10 mg total) by mouth daily.   FUROSEMIDE (LASIX) 40 MG TABLET    Take 1 tablet (40 mg total) by mouth daily.   IPRATROPIUM (ATROVENT) 0.03 % NASAL SPRAY    Place 2 sprays into both nostrils every 12 (twelve) hours.   MIRTAZAPINE (REMERON) 7.5 MG TABLET    Take 1 tablet (7.5 mg total) by mouth at bedtime.   NITROGLYCERIN (NITROSTAT) 0.4 MG SL TABLET    Place 1 tablet (0.4 mg total) under the tongue every 5 (five) minutes x 3 doses as needed for chest pain.   OXYGEN    Inhale 5 L/min into the lungs at bedtime as needed (for shortness of breath).   PANTOPRAZOLE (PROTONIX) 40 MG TABLET    TAKE 1 TABLET BY MOUTH EVERYDAY AT BEDTIME   TIOTROPIUM BROMIDE-OLODATEROL (STIOLTO RESPIMAT) 2.5-2.5 MCG/ACT AERS    INHALE 2 PUFFS BY MOUTH INTO THE LUNGS DAILY  Modified Medications   No medications on file  Discontinued  Medications   No medications on file    Physical Exam:  There were no vitals filed for this visit. There is no height or weight on file to calculate BMI. Wt Readings from Last 3 Encounters:  02/14/21 108 lb 9.6 oz (49.3 kg)  11/19/20 110 lb 9.6 oz (50.2 kg)  11/03/20 109 lb 12.6 oz (49.8 kg)      Labs reviewed: Basic Metabolic Panel: Recent Labs    11/15/20 1642 11/16/20 0349 11/17/20 0205 11/18/20 0328 11/19/20 1032  NA  --  142 139 139 138  K  --  4.3 4.1 4.2  4.0  CL  --  107 104 102 102  CO2  --  27 27 30 30   GLUCOSE  --  105* 89 98 88  BUN  --  26* 33* 34* 28*  CREATININE  --  1.45* 1.37* 1.34* 1.10  CALCIUM  --  9.0 8.8* 8.7* 8.9  MG 2.2  --  1.9 2.0  --   TSH  --  2.618  --   --   --    Liver Function Tests: Recent Labs    11/02/20 0659 11/03/20 0310 11/16/20 0349  AST 42* 20 16  ALT 24 18 14   ALKPHOS 167* 133* 115  BILITOT 0.9 1.0 1.0  PROT 6.0* 5.3* 5.5*  ALBUMIN 3.0* 2.9* 3.0*   No results for input(s): LIPASE, AMYLASE in the last 8760 hours. No results for input(s): AMMONIA in the last 8760 hours. CBC: Recent Labs    11/02/20 0659 11/15/20 1150 11/16/20 0349 11/18/20 0328 11/19/20 1032  WBC 9.4 8.7 10.8* 9.6 9.0  NEUTROABS 7.7 7.2  --   --   --   HGB 12.7* 12.5* 12.8* 11.7* 12.4*  HCT 40.5 39.6 40.2 36.8* 39.6  MCV 96.4 95.4 94.6 95.8 95.9  PLT 204 250 239 203 234   Lipid Panel: Recent Labs    11/19/20 1032  CHOL 117  HDL 43  LDLCALC 57  TRIG 85  CHOLHDL 2.7   TSH: Recent Labs    11/16/20 0349  TSH 2.618   A1C: Lab Results  Component Value Date   HGBA1C 5.6 11/19/2020     Assessment/Plan 1. Non-seasonal allergic rhinitis, unspecified trigger Doing better except mild congestion in bilateral nares with clear drainage - cetirizine (ZYRTEC) 10 MG tablet; Take 1 tablet (10 mg total) by mouth daily.  Dispense: 30 tablet; Refill: 11  2. Chronic idiopathic constipation -continue to stay hydrated with good fiber intake.   - polyethylene glycol powder (GLYCOLAX/MIRALAX) 17 GM/SCOOP powder; Take 17 g by mouth daily.  Dispense: 3350 g; Refill: 1  Romolo Sieling K. 11/18/20  [facility] [phone]    Virtual Visit via telephone  I connected with patient on 03/24/21 at 10:30 AM EDT by telephone- no video access and verified that I am speaking with the correct person using two identifiers.  Location: Patient: home Provider: [facility] office   I discussed the limitations, risks, security and privacy concerns of performing an evaluation and management service by telephone and the availability of in person appointments. I also discussed with the patient that there may be a patient responsible charge related to this service. The patient expressed understanding and agreed to proceed.   I discussed the assessment and treatment plan with the patient. The patient was provided an opportunity to ask questions and all were answered. The patient agreed with the plan and demonstrated an understanding of the instructions.   The patient was advised to call back or seek an in-person evaluation if the symptoms worsen or if the condition fails to improve as anticipated.  I provided 15 minutes of non-face-to-face time during this encounter.  03/26/21. [facility] Avs printed and mailed

## 2021-03-24 NOTE — Telephone Encounter (Signed)
Patient has Telephone Visit with Shanda Bumps this morning. Patient requesting Rx for antibiotics yesterday. EMS evaluated and stated that he told them we were going to call in Rx for antibiotic. Telephone visit scheduled with patient for this morning.

## 2021-04-17 ENCOUNTER — Inpatient Hospital Stay (HOSPITAL_COMMUNITY)
Admission: EM | Admit: 2021-04-17 | Discharge: 2021-04-26 | DRG: 193 | Disposition: A | Discharge status: Home / Self Care | Payer: 59 | Attending: Internal Medicine | Admitting: Internal Medicine

## 2021-04-17 ENCOUNTER — Other Ambulatory Visit: Payer: Self-pay

## 2021-04-17 ENCOUNTER — Emergency Department (HOSPITAL_COMMUNITY): Payer: 59

## 2021-04-17 ENCOUNTER — Encounter (HOSPITAL_COMMUNITY): Payer: Self-pay | Admitting: Emergency Medicine

## 2021-04-17 DIAGNOSIS — N1831 Chronic kidney disease, stage 3a: Secondary | ICD-10-CM | POA: Diagnosis present

## 2021-04-17 DIAGNOSIS — R8271 Bacteriuria: Secondary | ICD-10-CM | POA: Diagnosis present

## 2021-04-17 DIAGNOSIS — J9621 Acute and chronic respiratory failure with hypoxia: Secondary | ICD-10-CM | POA: Diagnosis present

## 2021-04-17 DIAGNOSIS — Z87891 Personal history of nicotine dependence: Secondary | ICD-10-CM

## 2021-04-17 DIAGNOSIS — L89151 Pressure ulcer of sacral region, stage 1: Secondary | ICD-10-CM | POA: Diagnosis present

## 2021-04-17 DIAGNOSIS — Z8249 Family history of ischemic heart disease and other diseases of the circulatory system: Secondary | ICD-10-CM

## 2021-04-17 DIAGNOSIS — B9620 Unspecified Escherichia coli [E. coli] as the cause of diseases classified elsewhere: Secondary | ICD-10-CM | POA: Diagnosis present

## 2021-04-17 DIAGNOSIS — Z888 Allergy status to other drugs, medicaments and biological substances status: Secondary | ICD-10-CM

## 2021-04-17 DIAGNOSIS — I2510 Atherosclerotic heart disease of native coronary artery without angina pectoris: Secondary | ICD-10-CM | POA: Diagnosis present

## 2021-04-17 DIAGNOSIS — D631 Anemia in chronic kidney disease: Secondary | ICD-10-CM | POA: Diagnosis present

## 2021-04-17 DIAGNOSIS — E43 Unspecified severe protein-calorie malnutrition: Secondary | ICD-10-CM | POA: Diagnosis not present

## 2021-04-17 DIAGNOSIS — Z28310 Unvaccinated for covid-19: Secondary | ICD-10-CM | POA: Diagnosis not present

## 2021-04-17 DIAGNOSIS — Z79899 Other long term (current) drug therapy: Secondary | ICD-10-CM | POA: Diagnosis not present

## 2021-04-17 DIAGNOSIS — J90 Pleural effusion, not elsewhere classified: Secondary | ICD-10-CM

## 2021-04-17 DIAGNOSIS — Z9981 Dependence on supplemental oxygen: Secondary | ICD-10-CM

## 2021-04-17 DIAGNOSIS — Z20822 Contact with and (suspected) exposure to covid-19: Secondary | ICD-10-CM | POA: Diagnosis present

## 2021-04-17 DIAGNOSIS — K219 Gastro-esophageal reflux disease without esophagitis: Secondary | ICD-10-CM

## 2021-04-17 DIAGNOSIS — Z681 Body mass index (BMI) 19 or less, adult: Secondary | ICD-10-CM

## 2021-04-17 DIAGNOSIS — R0602 Shortness of breath: Secondary | ICD-10-CM

## 2021-04-17 DIAGNOSIS — I5043 Acute on chronic combined systolic (congestive) and diastolic (congestive) heart failure: Secondary | ICD-10-CM | POA: Diagnosis not present

## 2021-04-17 DIAGNOSIS — I10 Essential (primary) hypertension: Secondary | ICD-10-CM | POA: Diagnosis present

## 2021-04-17 DIAGNOSIS — Z7902 Long term (current) use of antithrombotics/antiplatelets: Secondary | ICD-10-CM

## 2021-04-17 DIAGNOSIS — J3089 Other allergic rhinitis: Secondary | ICD-10-CM

## 2021-04-17 DIAGNOSIS — Z5901 Sheltered homelessness: Secondary | ICD-10-CM | POA: Diagnosis not present

## 2021-04-17 DIAGNOSIS — I5022 Chronic systolic (congestive) heart failure: Secondary | ICD-10-CM | POA: Diagnosis present

## 2021-04-17 DIAGNOSIS — Z886 Allergy status to analgesic agent status: Secondary | ICD-10-CM

## 2021-04-17 DIAGNOSIS — E785 Hyperlipidemia, unspecified: Secondary | ICD-10-CM | POA: Diagnosis present

## 2021-04-17 DIAGNOSIS — I130 Hypertensive heart and chronic kidney disease with heart failure and stage 1 through stage 4 chronic kidney disease, or unspecified chronic kidney disease: Secondary | ICD-10-CM | POA: Diagnosis present

## 2021-04-17 DIAGNOSIS — N179 Acute kidney failure, unspecified: Secondary | ICD-10-CM | POA: Diagnosis present

## 2021-04-17 DIAGNOSIS — Z820 Family history of epilepsy and other diseases of the nervous system: Secondary | ICD-10-CM

## 2021-04-17 DIAGNOSIS — J189 Pneumonia, unspecified organism: Principal | ICD-10-CM

## 2021-04-17 DIAGNOSIS — I252 Old myocardial infarction: Secondary | ICD-10-CM

## 2021-04-17 DIAGNOSIS — R9431 Abnormal electrocardiogram [ECG] [EKG]: Secondary | ICD-10-CM

## 2021-04-17 DIAGNOSIS — E876 Hypokalemia: Secondary | ICD-10-CM | POA: Diagnosis present

## 2021-04-17 DIAGNOSIS — J439 Emphysema, unspecified: Secondary | ICD-10-CM | POA: Diagnosis present

## 2021-04-17 DIAGNOSIS — I5020 Unspecified systolic (congestive) heart failure: Secondary | ICD-10-CM

## 2021-04-17 DIAGNOSIS — B964 Proteus (mirabilis) (morganii) as the cause of diseases classified elsewhere: Secondary | ICD-10-CM | POA: Diagnosis present

## 2021-04-17 DIAGNOSIS — I25118 Atherosclerotic heart disease of native coronary artery with other forms of angina pectoris: Secondary | ICD-10-CM

## 2021-04-17 DIAGNOSIS — R54 Age-related physical debility: Secondary | ICD-10-CM | POA: Diagnosis present

## 2021-04-17 DIAGNOSIS — R451 Restlessness and agitation: Secondary | ICD-10-CM | POA: Diagnosis not present

## 2021-04-17 DIAGNOSIS — Z7982 Long term (current) use of aspirin: Secondary | ICD-10-CM

## 2021-04-17 DIAGNOSIS — Z7984 Long term (current) use of oral hypoglycemic drugs: Secondary | ICD-10-CM

## 2021-04-17 DIAGNOSIS — J310 Chronic rhinitis: Secondary | ICD-10-CM

## 2021-04-17 DIAGNOSIS — R0902 Hypoxemia: Secondary | ICD-10-CM

## 2021-04-17 DIAGNOSIS — J449 Chronic obstructive pulmonary disease, unspecified: Secondary | ICD-10-CM

## 2021-04-17 DIAGNOSIS — N183 Chronic kidney disease, stage 3 unspecified: Secondary | ICD-10-CM

## 2021-04-17 DIAGNOSIS — R634 Abnormal weight loss: Secondary | ICD-10-CM

## 2021-04-17 DIAGNOSIS — Z833 Family history of diabetes mellitus: Secondary | ICD-10-CM

## 2021-04-17 DIAGNOSIS — K5904 Chronic idiopathic constipation: Secondary | ICD-10-CM

## 2021-04-17 DIAGNOSIS — R64 Cachexia: Secondary | ICD-10-CM | POA: Diagnosis present

## 2021-04-17 LAB — COMPREHENSIVE METABOLIC PANEL
ALT: 14 U/L (ref 0–44)
AST: 24 U/L (ref 15–41)
Albumin: 2.5 g/dL — ABNORMAL LOW (ref 3.5–5.0)
Alkaline Phosphatase: 114 U/L (ref 38–126)
Anion gap: 13 (ref 5–15)
BUN: 49 mg/dL — ABNORMAL HIGH (ref 8–23)
CO2: 31 mmol/L (ref 22–32)
Calcium: 9 mg/dL (ref 8.9–10.3)
Chloride: 92 mmol/L — ABNORMAL LOW (ref 98–111)
Creatinine, Ser: 1.7 mg/dL — ABNORMAL HIGH (ref 0.61–1.24)
GFR, Estimated: 41 mL/min — ABNORMAL LOW (ref >60)
Glucose, Bld: 98 mg/dL (ref 70–99)
Potassium: 3.1 mmol/L — ABNORMAL LOW (ref 3.5–5.1)
Sodium: 136 mmol/L (ref 135–145)
Total Bilirubin: 2 mg/dL — ABNORMAL HIGH (ref 0.3–1.2)
Total Protein: 6.8 g/dL (ref 6.5–8.1)

## 2021-04-17 LAB — URINALYSIS, ROUTINE W REFLEX MICROSCOPIC
Bilirubin Urine: NEGATIVE
Glucose, UA: 50 mg/dL — AB
Hgb urine dipstick: NEGATIVE
Ketones, ur: NEGATIVE mg/dL
Leukocytes,Ua: NEGATIVE
Nitrite: NEGATIVE
Protein, ur: NEGATIVE mg/dL
Specific Gravity, Urine: 1.012 (ref 1.005–1.030)
pH: 5 (ref 5.0–8.0)

## 2021-04-17 LAB — PROTIME-INR
INR: 1 (ref 0.8–1.2)
Prothrombin Time: 13.4 seconds (ref 11.4–15.2)

## 2021-04-17 LAB — CBC WITH DIFFERENTIAL/PLATELET
Abs Immature Granulocytes: 0.09 10*3/uL — ABNORMAL HIGH (ref 0.00–0.07)
Basophils Absolute: 0 10*3/uL (ref 0.0–0.1)
Basophils Relative: 0 %
Eosinophils Absolute: 0 10*3/uL (ref 0.0–0.5)
Eosinophils Relative: 0 %
HCT: 44.7 % (ref 39.0–52.0)
Hemoglobin: 14.8 g/dL (ref 13.0–17.0)
Immature Granulocytes: 1 %
Lymphocytes Relative: 2 %
Lymphs Abs: 0.3 10*3/uL — ABNORMAL LOW (ref 0.7–4.0)
MCH: 28.5 pg (ref 26.0–34.0)
MCHC: 33.1 g/dL (ref 30.0–36.0)
MCV: 86 fL (ref 80.0–100.0)
Monocytes Absolute: 0.9 10*3/uL (ref 0.1–1.0)
Monocytes Relative: 6 %
Neutro Abs: 14.6 10*3/uL — ABNORMAL HIGH (ref 1.7–7.7)
Neutrophils Relative %: 91 %
Platelets: 220 10*3/uL (ref 150–400)
RBC: 5.2 MIL/uL (ref 4.22–5.81)
RDW: 15.1 % (ref 11.5–15.5)
WBC: 16 10*3/uL — ABNORMAL HIGH (ref 4.0–10.5)
nRBC: 0 % (ref 0.0–0.2)

## 2021-04-17 LAB — APTT: aPTT: 30 seconds (ref 24–36)

## 2021-04-17 LAB — LACTIC ACID, PLASMA: Lactic Acid, Venous: 1.5 mmol/L (ref 0.5–1.9)

## 2021-04-17 LAB — BRAIN NATRIURETIC PEPTIDE: B Natriuretic Peptide: 1851.3 pg/mL — ABNORMAL HIGH (ref 0.0–100.0)

## 2021-04-17 MED ORDER — FUROSEMIDE 40 MG PO TABS
40.0000 mg | ORAL_TABLET | Freq: Every day | ORAL | Status: DC
  Administered 2021-04-18 – 2021-04-26 (×9): 40 mg via ORAL
  Filled 2021-04-17: qty 1
  Filled 2021-04-17 (×2): qty 2
  Filled 2021-04-17 (×6): qty 1

## 2021-04-17 MED ORDER — SODIUM CHLORIDE 0.9 % IV SOLN: 2.0000 g | INTRAVENOUS | Status: DC

## 2021-04-17 MED ORDER — MIRTAZAPINE 15 MG PO TABS
7.5000 mg | ORAL_TABLET | Freq: Every day | ORAL | Status: DC
  Administered 2021-04-18 – 2021-04-24 (×6): 7.5 mg via ORAL
  Filled 2021-04-17 (×10): qty 1

## 2021-04-17 MED ORDER — POTASSIUM CHLORIDE CRYS ER 20 MEQ PO TBCR
40.0000 meq | EXTENDED_RELEASE_TABLET | Freq: Two times a day (BID) | ORAL | Status: AC
  Administered 2021-04-18 (×2): 40 meq via ORAL
  Filled 2021-04-17: qty 2

## 2021-04-17 MED ORDER — ENOXAPARIN SODIUM 30 MG/0.3ML IJ SOSY
30.0000 mg | PREFILLED_SYRINGE | INTRAMUSCULAR | Status: DC
  Administered 2021-04-18: 30 mg via SUBCUTANEOUS
  Filled 2021-04-17 (×8): qty 0.3

## 2021-04-17 MED ORDER — VANCOMYCIN HCL IN DEXTROSE 1-5 GM/200ML-% IV SOLN
1000.0000 mg | Freq: Once | INTRAVENOUS | Status: AC
  Administered 2021-04-18: 1000 mg via INTRAVENOUS
  Filled 2021-04-17: qty 200

## 2021-04-17 MED ORDER — ATORVASTATIN CALCIUM 80 MG PO TABS
80.0000 mg | ORAL_TABLET | Freq: Every day | ORAL | Status: DC
  Administered 2021-04-18 – 2021-04-25 (×7): 80 mg via ORAL
  Filled 2021-04-17: qty 2
  Filled 2021-04-17 (×6): qty 1

## 2021-04-17 MED ORDER — CLOPIDOGREL BISULFATE 75 MG PO TABS
75.0000 mg | ORAL_TABLET | Freq: Every day | ORAL | Status: DC
  Administered 2021-04-18 – 2021-04-26 (×7): 75 mg via ORAL
  Filled 2021-04-17 (×9): qty 1

## 2021-04-17 MED ORDER — GUAIFENESIN-DM 100-10 MG/5ML PO SYRP: 10.0000 mL | ORAL_SOLUTION | Freq: Four times a day (QID) | ORAL | Status: DC | PRN

## 2021-04-17 MED ORDER — ALBUTEROL SULFATE HFA 108 (90 BASE) MCG/ACT IN AERS: 2.0000 | INHALATION_SPRAY | RESPIRATORY_TRACT | Status: DC | PRN

## 2021-04-17 MED ORDER — SODIUM CHLORIDE 0.9 % IV SOLN
2.0000 g | Freq: Once | INTRAVENOUS | Status: AC
  Administered 2021-04-17: 2 g via INTRAVENOUS

## 2021-04-17 MED ORDER — LACTATED RINGERS IV SOLN: INTRAVENOUS | Status: DC

## 2021-04-17 MED ORDER — CARVEDILOL 6.25 MG PO TABS
6.2500 mg | ORAL_TABLET | Freq: Two times a day (BID) | ORAL | Status: DC
  Administered 2021-04-18 – 2021-04-19 (×3): 6.25 mg via ORAL
  Filled 2021-04-17: qty 2
  Filled 2021-04-17 (×3): qty 1
  Filled 2021-04-17 (×2): qty 2
  Filled 2021-04-17 (×5): qty 1

## 2021-04-17 MED ORDER — ACETAMINOPHEN 325 MG PO TABS
650.0000 mg | ORAL_TABLET | Freq: Four times a day (QID) | ORAL | Status: DC | PRN
  Administered 2021-04-24 – 2021-04-25 (×2): 650 mg via ORAL
  Filled 2021-04-17 (×3): qty 2

## 2021-04-17 MED ORDER — UMECLIDINIUM BROMIDE 62.5 MCG/ACT IN AEPB
1.0000 | INHALATION_SPRAY | Freq: Every day | RESPIRATORY_TRACT | Status: DC
  Administered 2021-04-18 – 2021-04-25 (×4): 1 via RESPIRATORY_TRACT
  Filled 2021-04-17 (×2): qty 7

## 2021-04-17 MED ORDER — SODIUM CHLORIDE 0.9 % IV BOLUS
1000.0000 mL | Freq: Once | INTRAVENOUS | Status: AC
  Administered 2021-04-17: 1000 mL via INTRAVENOUS

## 2021-04-17 MED ORDER — DAPAGLIFLOZIN PROPANEDIOL 10 MG PO TABS
10.0000 mg | ORAL_TABLET | Freq: Every day | ORAL | Status: DC
  Administered 2021-04-18 – 2021-04-21 (×2): 10 mg via ORAL
  Filled 2021-04-17 (×5): qty 1

## 2021-04-17 MED ORDER — ACETAMINOPHEN 650 MG RE SUPP: 650.0000 mg | Freq: Four times a day (QID) | RECTAL | Status: DC | PRN

## 2021-04-17 NOTE — H&P (Signed)
History and Physical    Ray Smith WGN:[id number] DOB: April 28, 1946 DOA: 04/17/2021  PCP: Caesar Bookman, NP   Patient coming from: Home  Chief Complaint: SOB, cough  HPI: Ray Smith is a 75 y.o. male with medical history significant for CHF with EF 25-30%, COPD on chronic 5L, CAD, HLP, HTN, presenting for evaluation of worsening SOB and cough.  He reports that for the last 4 to 5 days he has had worsening shortness of breath that is exacerbated by ambulation and activity.  He reports an increasing cough with a green phlegm production that is intermittent.  Reports he has had decreased appetite and has not eaten in the last few days secondary to decreased appetite.  States he has not had any vomiting or diarrhea.  He uses oxygen at home but despite this he still has felt short of breath.  He states he has had subjective fever and chills but did not take his temperature.  States he has not had any rigors.  He denies any abdominal pain, he dysuria or urinary frequency.  He lives by himself and a friend will come check on him once a week.  He states he took some Tylenol at home when he felt chilled and used Mucinex for the cough which did not help.  Reports he has not had any swelling of his legs.  Denies any recent travel.  He states he has not been vaccinated for COVID-19.  He denies being around anyone who has been sick that he knows of. States he has a history of smoking but quit over 30 years ago and he denies alcohol or illicit drug use.   ED Course: [name] arrived he had tachycardia and tachypnea with normal blood pressure.  He did have a O2 saturation of 87% on room air when he initially arrived.  Oxygen saturation has improved with oxygen by nasal cannula.  Labs revealed WBC of 16,000 hemoglobin 14.8 hematocrit 44.7 platelets 220,000 sodium 136 potassium 3.1 chloride 92 bicarb 31 creatinine 1.70 BUN 49 baseline creatinine is 1.10-1.35.  Is 114 AST 24 ALT 14 albumin 2.5, lactic acid 1.5,  BNP 1851 which is decreased from his baseline of 2000-3000.  INR 1.0.  Chest x-ray reveals an atypical pneumonia pattern.  Covid19 swab and influenza swab has been obtained and is pending.  We will check magnesium level.  Hospitalist service was asked to admit patient for further management  Review of Systems:  General: Reports subjective fever, chills a few days ago. Denies weight loss, night sweats.  Denies dizziness. Reports decrease in appetite HENT: Denies head trauma, headache, denies change in hearing, tinnitus.  Denies nasal congestion or bleeding.  Denies sore throat.  Denies difficulty swallowing Eyes: Denies blurry vision, pain in eye, drainage.  Denies discoloration of eyes. Neck: Denies pain.  Denies swelling.  Denies pain with movement. Cardiovascular: Denies chest pain, palpitations.  Denies edema.  Denies orthopnea Respiratory: Reports shortness of breath, cough. Reports wheezing. Reports green sputum production Gastrointestinal: Denies abdominal pain, swelling.  Denies nausea, vomiting.  Denies melena.  Denies hematemesis. Musculoskeletal: Denies limitation of movement.  Denies deformity or swelling.  Denies pain.  Denies arthralgias or myalgias. Genitourinary: Denies pelvic pain.  Denies urinary frequency or hesitancy.  Denies dysuria.  Skin: Denies rash.  Denies petechiae, purpura, ecchymosis. Neurological: Denies syncope. Denies seizure activity. Denies paresthesia Denies slurred speech, drooping face. Denies visual change. Psychiatric: Denies depression, anxiety. Denies hallucinations.  Past Medical History:  Diagnosis Date  Acute ST elevation myocardial infarction (STEMI) involving left anterior descending (LAD) coronary artery (HCC) 12/30/2017   Acute systolic heart failure (HCC) 12/30/2017   Congestive heart failure (CHF) (HCC)    COPD (chronic obstructive pulmonary disease) (HCC)    Coronary artery disease    a.  presented with CP and inf STE but no ACS - LHC  (10/23/13):  Dist LM 40-60%, ostial LAD 80%, mid LAD 50%, ostial CFX 50-70%, mid RCA 50%.  EF 60%. - CP not felt to be ischemic; CABG vs Med Rx d/w pt - pt opted for Med Rx   History of kidney stones    "more than 20" (01/09/2018)   Hyperlipidemia    Hypertension    Marijuana abuse    MI (mitral incompetence)    Tobacco dependence     Past Surgical History:  Procedure Laterality Date   CARDIAC CATHETERIZATION     CORONARY/GRAFT ACUTE MI REVASCULARIZATION N/A 12/30/2017   Procedure: Coronary/Graft Acute MI Revascularization;  Surgeon: Tonny Bollman, MD;  Location: [facility] INVASIVE CV LAB;  Service: Cardiovascular;  Laterality: N/A;  Distal LM into LAD Xience SIerra 3.5x9mm Ostial Cx Xience [place] 3.5x52mm   LEFT HEART CATH AND CORONARY ANGIOGRAPHY N/A 12/30/2017   Procedure: LEFT HEART CATH AND CORONARY ANGIOGRAPHY;  Surgeon: Tonny Bollman, MD;  Location: [facility] INVASIVE CV LAB;  Service: Cardiovascular;  Laterality: N/A;   LEFT HEART CATHETERIZATION WITH CORONARY ANGIOGRAM Bilateral 10/23/2013   Procedure: LEFT HEART CATHETERIZATION WITH CORONARY ANGIOGRAM;  Surgeon: Lesleigh Noe, MD;  Location: [facility] CATH LAB;  Service: Cardiovascular;  Laterality: Bilateral;   snake bite surgery     TONSILLECTOMY  1955   TOTAL KNEE ARTHROPLASTY  2001    Social History  reports that he has quit smoking. He has never used smokeless tobacco. He reports that he does not currently use alcohol. He reports that he does not currently use drugs after having used the following drugs: Marijuana.  Allergies  Allergen Reactions   Tramadol Anaphylaxis   Flexeril [Cyclobenzaprine] Other (See Comments)    Per patient "it made my heart stop"   Ibuprofen Other (See Comments)    Overuse damages pt's kidneys    Ibuprofen Nausea Only and Other (See Comments)    Reaction not recalled- perhaps made his stomach hurt   Lactose Intolerance (Gi) Other (See Comments)    Digestive issues    Family History  Problem Relation  Age of Onset   Heart disease Mother    Alzheimer's disease Mother    Alzheimer's disease Father    Congestive Heart Failure Father    Multiple sclerosis Sister    Diabetes Maternal Grandmother    Drug abuse Other      Prior to Admission medications   Medication Sig Start Date End Date Taking? Authorizing Provider  albuterol (PROVENTIL) (2.5 MG/3ML) 0.083% nebulizer solution Use 3 mLs (2.5 mg total) by nebulization every 6 (six) hours as needed for wheezing or shortness of breath. 03/24/21   Ngetich, Dinah C, NP  albuterol (VENTOLIN HFA) 108 (90 Base) MCG/ACT inhaler Inhale 2 puffs into the lungs every 6 (six) hours as needed for wheezing or shortness of breath. 02/14/21   Ngetich, Dinah C, NP  aspirin 81 MG EC tablet Take 1 tablet (81 mg total) by mouth daily. 11/03/20   Eliezer Bottom, MD  atorvastatin (LIPITOR) 80 MG tablet Take 1 tablet (80 mg total) by mouth daily at 6 PM. 02/14/21 05/15/21  Ngetich, Dinah C, NP  carvedilol (COREG) 6.25  MG tablet Take 1 tablet (6.25 mg total) by mouth 2 (two) times daily with a meal. 02/14/21 03/24/21  Ngetich, Dinah C, NP  cetirizine (ZYRTEC) 10 MG tablet Take 1 tablet (10 mg total) by mouth daily. 03/24/21   Sharon Seller, NP  clopidogrel (PLAVIX) 75 MG tablet Take 1 tablet (75 mg total) by mouth daily. 02/14/21 05/15/21  Ngetich, Dinah C, NP  dapagliflozin propanediol (FARXIGA) 10 MG TABS tablet Take 1 tablet (10 mg total) by mouth daily. 02/14/21   Ngetich, Dinah C, NP  furosemide (LASIX) 40 MG tablet Take 1 tablet (40 mg total) by mouth daily. 02/14/21 03/24/21  Ngetich, Dinah C, NP  ipratropium (ATROVENT) 0.03 % nasal spray Place 2 sprays into both nostrils every 12 (twelve) hours. 02/14/21   Ngetich, Dinah C, NP  mirtazapine (REMERON) 7.5 MG tablet Take 1 tablet (7.5 mg total) by mouth at bedtime. 02/14/21 03/24/21  Ngetich, Dinah C, NP  nitroGLYCERIN (NITROSTAT) 0.4 MG SL tablet Place 1 tablet (0.4 mg total) under the tongue every 5 (five) minutes x 3 doses  as needed for chest pain. 11/03/20   Eliezer Bottom, MD  OXYGEN Inhale 5 L/min into the lungs at bedtime as needed (for shortness of breath).    [provider]  pantoprazole (PROTONIX) 40 MG tablet TAKE 1 TABLET BY MOUTH EVERYDAY AT BEDTIME 02/14/21   Ngetich, Dinah C, NP  polyethylene glycol powder (GLYCOLAX/MIRALAX) 17 GM/SCOOP powder Take 17 g by mouth daily. 03/24/21   Sharon Seller, NP  Tiotropium Bromide-Olodaterol (STIOLTO RESPIMAT) 2.5-2.5 MCG/ACT AERS INHALE 2 PUFFS BY MOUTH INTO THE LUNGS DAILY 02/14/21   Ngetich, Donalee Citrin, NP    Physical Exam: Vitals:   04/17/21 2025 04/17/21 2027 04/17/21 2245  BP:  (!) 129/96 119/83  Pulse:  (!) 148 89  Resp:  (!) 22 (!) 30  Temp: 97.9 F (36.6 C) 97.7 F (36.5 C)   TempSrc: Oral Oral   SpO2:  (!) 87% 100%    Constitutional: NAD, calm, comfortable Vitals:   04/17/21 2025 04/17/21 2027 04/17/21 2245  BP:  (!) 129/96 119/83  Pulse:  (!) 148 89  Resp:  (!) 22 (!) 30  Temp: 97.9 F (36.6 C) 97.7 F (36.5 C)   TempSrc: Oral Oral   SpO2:  (!) 87% 100%   General: Thin cachectic elderly male, Alert and oriented x3.  Eyes: EOMI, PERRL, conjunctivae normal.  Sclera nonicteric HENT:  [place]/AT, external ears normal.  Nares patent without epistasis.  Mucous membranes are dry. Posterior pharynx clear.  Poor dentition Neck: Soft, normal range of motion, supple, no masses, no thyromegaly. Trachea midline Respiratory: Diminished breath sounds bilaterally with diffuse scattered Rales and expiratory wheezing, no crackles. Normal respiratory effort. No accessory muscle use.  Cardiovascular: Regular rate and rhythm, no murmurs / rubs / gallops. No extremity edema. 1+ pedal pulses.  Abdomen: Soft, no tenderness, nondistended, no rebound or guarding.  No masses palpated. No hepatosplenomegaly. Bowel sounds normoactive Musculoskeletal: FROM.  Has clubbing of digits.  cyanosis. No joint deformity upper and lower extremities. Normal muscle tone.   Skin: Warm, dry, intact no rashes, lesions, ulcers. No induration.  No ecchymosis.  No jaundice. Neurologic: CN 2-12 grossly intact.  Normal speech.  Sensation intact to touch. Strength 4/5 in all extremities.   Psychiatric: Normal judgment and insight.  Normal mood.    Labs on Admission: I have personally reviewed following labs and imaging studies  CBC: Recent Labs  Lab 04/17/21 2100  WBC 16.0*  NEUTROABS 14.6*  HGB 14.8  HCT 44.7  MCV 86.0  PLT 220    Basic Metabolic Panel: Recent Labs  Lab 04/17/21 2100  NA 136  K 3.1*  CL 92*  CO2 31  GLUCOSE 98  BUN 49*  CREATININE 1.70*  CALCIUM 9.0    GFR: CrCl cannot be calculated (Unknown ideal weight.).  Liver Function Tests: Recent Labs  Lab 04/17/21 2100  AST 24  ALT 14  ALKPHOS 114  BILITOT 2.0*  PROT 6.8  ALBUMIN 2.5*    Urine analysis:    Component Value Date/Time   COLORURINE YELLOW 04/17/2021 2058   APPEARANCEUR CLEAR 04/17/2021 2058   LABSPEC 1.012 04/17/2021 2058   PHURINE 5.0 04/17/2021 2058   GLUCOSEU 50 (A) 04/17/2021 2058   HGBUR NEGATIVE 04/17/2021 2058   BILIRUBINUR NEGATIVE 04/17/2021 2058   KETONESUR NEGATIVE 04/17/2021 2058   PROTEINUR NEGATIVE 04/17/2021 2058   NITRITE NEGATIVE 04/17/2021 2058   LEUKOCYTESUR NEGATIVE 04/17/2021 2058    Radiological Exams on Admission: DG Chest Port 1 View  Result Date: 04/17/2021 CLINICAL DATA:  Questionable sepsis.  Evaluate for abnormality. EXAM: PORTABLE CHEST 1 VIEW COMPARISON:  Chest radiograph dated 12/12/2020. FINDINGS: Background of emphysema. Diffuse reticular interstitial densities throughout the lungs, new since the prior radiograph and concerning for atypical infiltrate. No consolidative changes. There is no pleural effusion pneumothorax. Stable cardiomediastinal silhouette. The aorta is tortuous. No acute osseous pathology. IMPRESSION: Diffuse reticular interstitial densities throughout the lungs concerning for atypical infiltrate.  Electronically Signed   By: Elgie Collard M.D.   On: 04/17/2021 20:58    EKG: Independently reviewed.  EKG shows normal sinus rhythm with occasional PACs.  Right bundle branch block present.  No acute ST elevation or depression.  Nonspecific ST changes.  QTC prolonged at 562  Assessment/Plan Principal Problem:   Atypical pneumonia Ray Smith is admitted to Medical Telemetry floor.  He is started on vancomycin and cefepime for atypical pneumonia in light of his COPD. Supplemental oxygen by nasal cannula provided to keep O2 sat between 92 to 96%. Incentive spirometer every 2 hours while awake Robitussin-DM every 4 hours as needed for cough Gentle IV fluid hydration with LR at 50 mils per hour overnight  Active Problems:   COPD (chronic obstructive pulmonary disease)  Anora Ellipta provided.  Albuterol MDI as needed for shortness of breath, cough, wheeze    Hypoxemia Supplemental oxygen as above    CKD (chronic kidney disease) stage 3, GFR 30-59 ml/min  Stable.  Monitor electrolytes and renal function with labs in morning    Hypertension Continue Coreg.  Monitor blood pressure    Coronary artery disease Stable.  Continue home medications.  No chest pain or palpitations    HFrEF (heart failure with reduced ejection fraction) Chronic.  Did continue Coreg, Lasix and Farxiga    Prolonged QT interval Avoid medications which could further prolong QT interval.  Monitor on telemetry    Hypokalemia Supplemental potassium with K. Dur provided.  Recheck electrolytes in morning.  Magnesium level ordered   DVT prophylaxis: Lovenox for DVT prophylaxis  Code Status:   Full Code  Family Communication:  Diagnosis and plan discussed with patient.  Questions answered.  He agrees with plan.  Further recommendations to follow as clinical indicated Disposition Plan:   Patient is from:  Home  Anticipated DC to:  Home  Anticipated DC date:  Anticipate 2 midnight or more stay in the  hospital  Admission status:  Inpatient  Claudean Severance Shaquna Geigle  MD [facility]  How to contact the [facility] Attending or Consulting provider 7A - 7P or covering provider during after hours 7P -7A, for this patient?   Check the care team in [facility] and look for a) attending/consulting [facility] provider listed and b) the [facility] team listed Log into [email or web address] and use [place] universal password to access. If you do not have the password, please contact the hospital operator. Locate the [facility] provider you are looking for under [facility] and page to a number that you can be directly reached. If you still have difficulty reaching the provider, please page the [facility] (Director on Call) for the Hospitalists listed on amion for assistance.  04/17/2021, 11:55 PM

## 2021-04-17 NOTE — ED Provider Notes (Signed)
[facility] EMERGENCY DEPARTMENT Provider Note   CSN: [id number] Arrival date & time: 04/17/21  2019     History Chief Complaint  Patient presents with   Shortness of Breath    Ray Smith is a 75 y.o. male.  The history is provided by the patient and medical records.  Shortness of Breath Associated symptoms: cough    75 y.o. M with hx of CHF with EF 25-30%, COPD on chronic 5L, CAD, HLP, HTN, presenting to the ED with worsening SOB, cough, and failure to thrive.  States he has been feeling awful for the past week.  Has had cough with chest congestion but states "it will not come up".  States he has not eaten in 4 days as he has not had much of an appetite.  He denies vomiting or diarrhea.  He has been on his chronic 5L but still feels SOB.  Subjective fever/chills but no documented temps.  He states he lives alone, has a friend that comes by to check on him once a week.  Past Medical History:  Diagnosis Date   Acute ST elevation myocardial infarction (STEMI) involving left anterior descending (LAD) coronary artery (HCC) 12/30/2017   Acute systolic heart failure (HCC) 12/30/2017   Congestive heart failure (CHF) (HCC)    COPD (chronic obstructive pulmonary disease) (HCC)    Coronary artery disease    a.  presented with CP and inf STE but no ACS - LHC (10/23/13):  Dist LM 40-60%, ostial LAD 80%, mid LAD 50%, ostial CFX 50-70%, mid RCA 50%.  EF 60%. - CP not felt to be ischemic; CABG vs Med Rx d/w pt - pt opted for Med Rx   History of kidney stones    "more than 20" (01/09/2018)   Hyperlipidemia    Hypertension    Marijuana abuse    MI (mitral incompetence)    Tobacco dependence     Patient Active Problem List   Diagnosis Date Noted   Atypical pneumonia 04/17/2021   CKD (chronic kidney disease) stage 3, GFR 30-59 ml/min (HCC) 04/17/2021   Hypoxemia 04/17/2021   Prolonged QT interval 04/17/2021   HFrEF (heart failure with reduced ejection fraction) (HCC)  04/17/2021   Hypokalemia 04/17/2021   Former cigarette smoker 02/14/2021   Congestive heart failure (CHF) (HCC)    Coronary artery disease    Hypertension     Acute decompensated heart failure (HCC) 11/02/2020   PVD (peripheral vascular disease) (HCC) 11/02/2020   COPD with acute exacerbation (HCC) 09/03/2019   Acute exacerbation of CHF (congestive heart failure) (HCC) 09/02/2019   Adjustment disorder with mixed disturbance of emotions and conduct    Protein-calorie malnutrition, severe 01/10/2018   Orthostatic hypotension 01/09/2018   Weakness 01/09/2018   Mouth pain 01/09/2018   CKD (chronic kidney disease) stage 2, GFR 60-89 ml/min 01/09/2018   Marijuana abuse 01/09/2018   Acute systolic heart failure (HCC) 01/02/2018   STEMI (ST elevation myocardial infarction) (HCC) 12/30/2017   STEMI involving left anterior descending coronary artery (HCC) 12/30/2017   Acute encephalopathy 10/06/2017   AKI (acute kidney injury) (HCC)    Altered mental status    Hypertension 11/12/2013   Dyslipidemia 10/24/2013   ACS (acute coronary syndrome) (HCC) 10/23/2013   Chest pain 10/23/2013   Coronary Artery Disease 10/23/2013   COPD (chronic obstructive pulmonary disease) (HCC) 10/23/2013   Tobacco abuse 10/23/2013   Precordial pain 10/23/2013    Past Surgical History:  Procedure Laterality Date   CARDIAC  CATHETERIZATION     CORONARY/GRAFT ACUTE MI REVASCULARIZATION N/A 12/30/2017   Procedure: Coronary/Graft Acute MI Revascularization;  Surgeon: Tonny Bollman, MD;  Location: [facility] INVASIVE CV LAB;  Service: Cardiovascular;  Laterality: N/A;  Distal LM into LAD Xience [place] 3.5x37mm Ostial Cx Xience [place] 3.5x52mm   LEFT HEART CATH AND CORONARY ANGIOGRAPHY N/A 12/30/2017   Procedure: LEFT HEART CATH AND CORONARY ANGIOGRAPHY;  Surgeon: Tonny Bollman, MD;  Location: [facility] INVASIVE CV LAB;  Service: Cardiovascular;  Laterality: N/A;   LEFT HEART CATHETERIZATION WITH CORONARY ANGIOGRAM Bilateral  10/23/2013   Procedure: LEFT HEART CATHETERIZATION WITH CORONARY ANGIOGRAM;  Surgeon: Lesleigh Noe, MD;  Location: [facility] CATH LAB;  Service: Cardiovascular;  Laterality: Bilateral;   snake bite surgery     TONSILLECTOMY  1955   TOTAL KNEE ARTHROPLASTY  2001       Family History  Problem Relation Age of Onset   Heart disease Mother    Alzheimer's disease Mother    Alzheimer's disease Father    Congestive Heart Failure Father    Multiple sclerosis Sister    Diabetes Maternal Grandmother    Drug abuse Other     Social History   Tobacco Use   Smoking status: Former   Smokeless tobacco: Never   Tobacco comments:    Last smoked 20 years ago  Substance Use Topics   Alcohol use: Not Currently    Comment: Not in 21 days   Drug use: Not Currently    Types: Marijuana    Home Medications Prior to Admission medications   Medication Sig Start Date End Date Taking? Authorizing Provider  albuterol (PROVENTIL) (2.5 MG/3ML) 0.083% nebulizer solution Use 3 mLs (2.5 mg total) by nebulization every 6 (six) hours as needed for wheezing or shortness of breath. 03/24/21   Ngetich, Dinah C, NP  albuterol (VENTOLIN HFA) 108 (90 Base) MCG/ACT inhaler Inhale 2 puffs into the lungs every 6 (six) hours as needed for wheezing or shortness of breath. 02/14/21   Ngetich, Dinah C, NP  aspirin 81 MG EC tablet Take 1 tablet (81 mg total) by mouth daily. 11/03/20   Eliezer Bottom, MD  atorvastatin (LIPITOR) 80 MG tablet Take 1 tablet (80 mg total) by mouth daily at 6 PM. 02/14/21 05/15/21  Ngetich, Dinah C, NP  carvedilol (COREG) 6.25 MG tablet Take 1 tablet (6.25 mg total) by mouth 2 (two) times daily with a meal. 02/14/21 03/24/21  Ngetich, Dinah C, NP  cetirizine (ZYRTEC) 10 MG tablet Take 1 tablet (10 mg total) by mouth daily. 03/24/21   Sharon Seller, NP  clopidogrel (PLAVIX) 75 MG tablet Take 1 tablet (75 mg total) by mouth daily. 02/14/21 05/15/21  Ngetich, Dinah C, NP  dapagliflozin propanediol (FARXIGA)  10 MG TABS tablet Take 1 tablet (10 mg total) by mouth daily. 02/14/21   Ngetich, Dinah C, NP  furosemide (LASIX) 40 MG tablet Take 1 tablet (40 mg total) by mouth daily. 02/14/21 03/24/21  Ngetich, Dinah C, NP  ipratropium (ATROVENT) 0.03 % nasal spray Place 2 sprays into both nostrils every 12 (twelve) hours. 02/14/21   Ngetich, Dinah C, NP  mirtazapine (REMERON) 7.5 MG tablet Take 1 tablet (7.5 mg total) by mouth at bedtime. 02/14/21 03/24/21  Ngetich, Dinah C, NP  nitroGLYCERIN (NITROSTAT) 0.4 MG SL tablet Place 1 tablet (0.4 mg total) under the tongue every 5 (five) minutes x 3 doses as needed for chest pain. 11/03/20   Eliezer Bottom, MD  OXYGEN Inhale 5 L/min into the  lungs at bedtime as needed (for shortness of breath).    [provider]  pantoprazole (PROTONIX) 40 MG tablet TAKE 1 TABLET BY MOUTH EVERYDAY AT BEDTIME 02/14/21   Ngetich, Dinah C, NP  polyethylene glycol powder (GLYCOLAX/MIRALAX) 17 GM/SCOOP powder Take 17 g by mouth daily. 03/24/21   Sharon Seller, NP  Tiotropium Bromide-Olodaterol (STIOLTO RESPIMAT) 2.5-2.5 MCG/ACT AERS INHALE 2 PUFFS BY MOUTH INTO THE LUNGS DAILY 02/14/21   Ngetich, Dinah C, NP    Allergies    Tramadol, Flexeril [cyclobenzaprine], Ibuprofen, Ibuprofen, and Lactose intolerance (gi)  Review of Systems   Review of Systems  Constitutional:  Positive for chills.  Respiratory:  Positive for cough and shortness of breath.   All other systems reviewed and are negative.  Physical Exam Updated Vital Signs BP 132/86   Pulse 88   Temp 97.7 F (36.5 C) (Oral)   Resp (!) 22   SpO2 98%   Physical Exam Vitals and nursing note reviewed.  Constitutional:      Appearance: He is well-developed.     Comments: Elderly, chronically ill appearing  HENT:     Head: Normocephalic and atraumatic.     Mouth/Throat:     Comments: Dry mucous membranes Eyes:     Conjunctiva/sclera: Conjunctivae normal.     Pupils: Pupils are equal, round, and reactive to light.   Cardiovascular:     Rate and Rhythm: Normal rate and regular rhythm.     Heart sounds: Normal heart sounds.  Pulmonary:     Effort: Pulmonary effort is normal.     Comments: On chronic 5L coarse breath sounds bilaterally Abdominal:     General: Bowel sounds are normal.     Palpations: Abdomen is soft.  Musculoskeletal:        General: Normal range of motion.     Cervical back: Normal range of motion.  Skin:    General: Skin is warm and dry.  Neurological:     Mental Status: He is alert and oriented to person, place, and time.    ED Results / Procedures / Treatments   Labs (all labs ordered are listed, but only abnormal results are displayed) Labs Reviewed  COMPREHENSIVE METABOLIC PANEL - Abnormal; Notable for the following components:      Result Value   Potassium 3.1 (*)    Chloride 92 (*)    BUN 49 (*)    Creatinine, Ser 1.70 (*)    Albumin 2.5 (*)    Total Bilirubin 2.0 (*)    GFR, Estimated 41 (*)    All other components within normal limits  CBC WITH DIFFERENTIAL/PLATELET - Abnormal; Notable for the following components:   WBC 16.0 (*)    Neutro Abs 14.6 (*)    Lymphs Abs 0.3 (*)    Abs Immature Granulocytes 0.09 (*)    All other components within normal limits  URINALYSIS, ROUTINE W REFLEX MICROSCOPIC - Abnormal; Notable for the following components:   Glucose, UA 50 (*)    All other components within normal limits  BRAIN NATRIURETIC PEPTIDE - Abnormal; Notable for the following components:   B Natriuretic Peptide 1,851.3 (*)    All other components within normal limits  RESP PANEL BY RT-PCR (FLU A&B, COVID) ARPGX2  URINE CULTURE  CULTURE, BLOOD (ROUTINE X 2)  CULTURE, BLOOD (ROUTINE X 2)  LACTIC ACID, PLASMA  LACTIC ACID, PLASMA  PROTIME-INR  APTT  MAGNESIUM  BASIC METABOLIC PANEL  CBC    EKG None  Radiology  DG Chest Port 1 View  Result Date: 04/17/2021 CLINICAL DATA:  Questionable sepsis.  Evaluate for abnormality. EXAM: PORTABLE CHEST 1 VIEW  COMPARISON:  Chest radiograph dated 12/12/2020. FINDINGS: Background of emphysema. Diffuse reticular interstitial densities throughout the lungs, new since the prior radiograph and concerning for atypical infiltrate. No consolidative changes. There is no pleural effusion pneumothorax. Stable cardiomediastinal silhouette. The aorta is tortuous. No acute osseous pathology. IMPRESSION: Diffuse reticular interstitial densities throughout the lungs concerning for atypical infiltrate. Electronically Signed   By: Elgie Collard M.D.   On: 04/17/2021 20:58    Procedures Procedures   CRITICAL CARE Performed by: Garlon Hatchet   Total critical care time: 45 minutes  Critical care time was exclusive of separately billable procedures and treating other patients.  Critical care was necessary to treat or prevent imminent or life-threatening deterioration.  Critical care was time spent personally by me on the following activities: development of treatment plan with patient and/or surrogate as well as nursing, discussions with consultants, evaluation of patient's response to treatment, examination of patient, obtaining history from patient or surrogate, ordering and performing treatments and interventions, ordering and review of laboratory studies, ordering and review of radiographic studies, pulse oximetry and re-evaluation of patient's condition.   Medications Ordered in ED Medications  atorvastatin (LIPITOR) tablet 80 mg (has no administration in time range)  carvedilol (COREG) tablet 6.25 mg (has no administration in time range)  furosemide (LASIX) tablet 40 mg (has no administration in time range)  mirtazapine (REMERON) tablet 7.5 mg (7.5 mg Oral Given 04/18/21 0118)  dapagliflozin propanediol (FARXIGA) tablet 10 mg (has no administration in time range)  clopidogrel (PLAVIX) tablet 75 mg (has no administration in time range)  umeclidinium bromide (INCRUSE ELLIPTA) 62.5 MCG/ACT 1 puff (has no  administration in time range)  potassium chloride SA (KLOR-CON) CR tablet 40 mEq (40 mEq Oral Given 04/18/21 0008)  enoxaparin (LOVENOX) injection 30 mg (has no administration in time range)  lactated ringers infusion ( Intravenous New Bag/Given 04/18/21 0008)  acetaminophen (TYLENOL) tablet 650 mg (has no administration in time range)    Or  acetaminophen (TYLENOL) suppository 650 mg (has no administration in time range)  guaiFENesin-dextromethorphan (ROBITUSSIN DM) 100-10 MG/5ML syrup 10 mL (has no administration in time range)  albuterol (PROVENTIL) (2.5 MG/3ML) 0.083% nebulizer solution 2.5 mg (has no administration in time range)  ceFEPIme (MAXIPIME) 2 g in sodium chloride 0.9 % 100 mL IVPB (has no administration in time range)  vancomycin (VANCOREADY) IVPB 750 mg/150 mL (has no administration in time range)  sodium chloride 0.9 % bolus 1,000 mL (0 mLs Intravenous Stopped 04/17/21 2321)  vancomycin (VANCOCIN) IVPB 1000 mg/200 mL premix (0 mg Intravenous Stopped 04/18/21 0118)  ceFEPIme (MAXIPIME) 2 g in sodium chloride 0.9 % 100 mL IVPB (0 g Intravenous Stopped 04/17/21 2358)    ED Course  I have reviewed the triage vital signs and the nursing notes.  Pertinent labs & imaging results that were available during my care of the patient were reviewed by me and considered in my medical decision making (see chart for details).    MDM Rules/Calculators/A&P                           75 y.o. M here with cough, subjective fever, and failure to thrive.  He is afebrile, but noted to be hypoxic on arrival but improved with additional O2.  Does appear chronically ill on exam with coarse  breath sounds.  Code sepsis was activated from triage.  Normal lactic but WBC count 16K.  SrCr and BUN somewhat elevated from baseline. CXR with concern of atypical pneumonia. Due to his advanced CHF, given judicious fluids.  Started vanc/cefepime.   Currently on his baseline O2.  Will require admission.  Discussed  with Dr. Rachael Darby-- will admit for ongoing care.  Final Clinical Impression(s) / ED Diagnoses Final diagnoses:  Atypical pneumonia    Rx / DC Orders ED Discharge Orders     None        Garlon Hatchet, PA-C 04/18/21 0535    Gilda Crease, MD 04/22/21 0005

## 2021-04-17 NOTE — ED Notes (Signed)
Ekg done and in the green zone pile.

## 2021-04-17 NOTE — ED Provider Notes (Signed)
Emergency Medicine Provider Triage Evaluation Note  Ray Smith , a 75 y.o. male  was evaluated in triage.  Pt complains of shortness of breath and failure to thrive.  Patient reports that he is generalized weakness over the last few days which has kept him from ambulating.  Patient reports that he has had decreased p.o. intake over the last few days.  Patient endorses shortness of breath.  Endorses cough but denies any production.  Patient also endorses chills.  Per EMS when they arrived he was not on his home O2.  Review of Systems  Positive: Chills, cough, generalized weakness, shortness of breath, generalized myalgias Negative: Nausea, vomiting, diarrhea  Physical Exam  BP (!) 129/96 (BP Location: Right Arm)   Pulse (!) 148   Temp 97.7 F (36.5 C) (Oral)   Resp (!) 22   SpO2 (!) 87%  Gen:   Awake, no distress , cachectic Resp:  Normal effort, lungs clear to auscultation bilaterally MSK:   Moves extremities without difficulty  Other:  Abdomen soft, nondistended, nontender.  Medical Decision Making  Medically screening exam initiated at 8:30 PM.  Appropriate orders placed.  Ray Smith was informed that the remainder of the evaluation will be completed by another provider, this initial triage assessment does not replace that evaluation, and the importance of remaining in the ED until their evaluation is complete.  ED evolving sepsis initiated.   Berneice Heinrich 04/17/21 2032    Jacalyn Lefevre, MD 04/17/21 [phone]

## 2021-04-17 NOTE — ED Triage Notes (Signed)
Per [facility], pt from home (poor living conditions), C/O SOB.  Lives alone, reports he has not been out of the chair in "a couple of days and not eaten in 2 days."  Poor historian, not on home O2 (can't tell us how much he should be on), can't read prescription bottles, not sure when he last took meds.  Pt c/o pain all over, felt cold 97 temp for EMS.    113/60 89% O2 on 4L [place] CBG 139

## 2021-04-18 DIAGNOSIS — J189 Pneumonia, unspecified organism: Secondary | ICD-10-CM | POA: Diagnosis not present

## 2021-04-18 DIAGNOSIS — N1831 Chronic kidney disease, stage 3a: Secondary | ICD-10-CM | POA: Diagnosis not present

## 2021-04-18 DIAGNOSIS — J449 Chronic obstructive pulmonary disease, unspecified: Secondary | ICD-10-CM | POA: Diagnosis not present

## 2021-04-18 DIAGNOSIS — I5020 Unspecified systolic (congestive) heart failure: Secondary | ICD-10-CM

## 2021-04-18 DIAGNOSIS — I10 Essential (primary) hypertension: Secondary | ICD-10-CM

## 2021-04-18 DIAGNOSIS — E876 Hypokalemia: Secondary | ICD-10-CM

## 2021-04-18 LAB — BASIC METABOLIC PANEL
Anion gap: 12 (ref 5–15)
BUN: 44 mg/dL — ABNORMAL HIGH (ref 8–23)
CO2: 29 mmol/L (ref 22–32)
Calcium: 7.4 mg/dL — ABNORMAL LOW (ref 8.9–10.3)
Chloride: 95 mmol/L — ABNORMAL LOW (ref 98–111)
Creatinine, Ser: 1.54 mg/dL — ABNORMAL HIGH (ref 0.61–1.24)
GFR, Estimated: 46 mL/min — ABNORMAL LOW (ref >60)
Glucose, Bld: 81 mg/dL (ref 70–99)
Potassium: 3.2 mmol/L — ABNORMAL LOW (ref 3.5–5.1)
Sodium: 136 mmol/L (ref 135–145)

## 2021-04-18 LAB — RESP PANEL BY RT-PCR (FLU A&B, COVID) ARPGX2
Influenza A by PCR: NEGATIVE
Influenza B by PCR: NEGATIVE
SARS Coronavirus 2 by RT PCR: NEGATIVE

## 2021-04-18 LAB — CBC
HCT: 39.1 % (ref 39.0–52.0)
Hemoglobin: 13 g/dL (ref 13.0–17.0)
MCH: 28.6 pg (ref 26.0–34.0)
MCHC: 33.2 g/dL (ref 30.0–36.0)
MCV: 85.9 fL (ref 80.0–100.0)
Platelets: 195 10*3/uL (ref 150–400)
RBC: 4.55 MIL/uL (ref 4.22–5.81)
RDW: 15.1 % (ref 11.5–15.5)
WBC: 15.1 10*3/uL — ABNORMAL HIGH (ref 4.0–10.5)
nRBC: 0 % (ref 0.0–0.2)

## 2021-04-18 LAB — LACTIC ACID, PLASMA: Lactic Acid, Venous: 1.4 mmol/L (ref 0.5–1.9)

## 2021-04-18 LAB — MAGNESIUM: Magnesium: 2.1 mg/dL (ref 1.7–2.4)

## 2021-04-18 MED ORDER — VANCOMYCIN HCL IN DEXTROSE 1-5 GM/200ML-% IV SOLN
1000.0000 mg | INTRAVENOUS | Status: DC
  Administered 2021-04-20: 1000 mg via INTRAVENOUS
  Filled 2021-04-18: qty 200

## 2021-04-18 MED ORDER — ALBUTEROL SULFATE (2.5 MG/3ML) 0.083% IN NEBU: 2.5000 mg | INHALATION_SOLUTION | RESPIRATORY_TRACT | Status: DC | PRN

## 2021-04-18 MED ORDER — SODIUM CHLORIDE 0.9 % IV SOLN
2.0000 g | INTRAVENOUS | Status: DC
  Administered 2021-04-18 – 2021-04-19 (×2): 2 g via INTRAVENOUS
  Filled 2021-04-18 (×2): qty 2

## 2021-04-18 MED ORDER — VANCOMYCIN HCL 750 MG/150ML IV SOLN: 750.0000 mg | INTRAVENOUS | Status: DC

## 2021-04-18 MED ORDER — GUAIFENESIN ER 600 MG PO TB12
1200.0000 mg | ORAL_TABLET | Freq: Two times a day (BID) | ORAL | Status: DC
  Administered 2021-04-18 – 2021-04-26 (×15): 1200 mg via ORAL
  Filled 2021-04-18 (×16): qty 2

## 2021-04-18 NOTE — ED Notes (Signed)
Breakfast tray given. °

## 2021-04-18 NOTE — ED Notes (Signed)
Breakfast Orders placed 

## 2021-04-18 NOTE — Progress Notes (Signed)
Pharmacy Antibiotic Note  Ray Smith is a 75 y.o. male admitted on 04/17/2021 with SOB/PNA.  Pharmacy has been consulted for Vancomycin and Cefepime dosing.  Vancomycin 1 g IV given in ED at midnight  Plan: Vancomycin 750 mg IV q48h Cefepime 2 g IV q24h     Temp (24hrs), Avg:97.8 F (36.6 C), Min:97.7 F (36.5 C), Max:97.9 F (36.6 C)  Recent Labs  Lab 04/17/21 2100  WBC 16.0*  CREATININE 1.70*  LATICACIDVEN 1.5    CrCl cannot be calculated (Unknown ideal weight.).    Allergies  Allergen Reactions   Tramadol Anaphylaxis   Flexeril [Cyclobenzaprine] Other (See Comments)    Per patient "it made my heart stop"   Ibuprofen Other (See Comments)    Overuse damages pt's kidneys    Ibuprofen Nausea Only and Other (See Comments)    Reaction not recalled- perhaps made his stomach hurt   Lactose Intolerance (Gi) Other (See Comments)    Digestive issues    Eddie Candle 04/18/2021 12:19 AM

## 2021-04-18 NOTE — ED Notes (Signed)
Lunch tray given. 

## 2021-04-18 NOTE — ED Notes (Signed)
Patient c/o being very uncomfortable, states his bottom is hurting. Patient rolled on his side patient cleaned and pad applies to top of buttocks area red and stage 1 decubiti  , linens changed and patient repositioned. Voiced he was more comfortable.

## 2021-04-18 NOTE — Progress Notes (Signed)
PROGRESS NOTE    Ray Smith  UYQ:[id number] DOB: [birth year] DOA: 04/17/2021 PCP: Caesar Bookman, NP   Brief Narrative:  Patient is a 75 year old thin cachectic chronically ill-appearing Caucasian male with a past medical history significant for but not limited to systolic CHF with an EF of 25 to 30%, COPD with questionable amount of oxygen that he is on and is reported that he is on as high as 5 L, CAD with history of STEMI, HLD, hypertension, marijuana abuse, tobacco dependence, history of nephrolithiasis as well as other comorbidities who presented for worsening evaluation of shortness of breath cough.  Patient states that for the last 4 to 5 days he had worsening shortness of breath that is exacerbated by ambulation and activity.  He also notes increased sputum production with increased coughing with green phlegm that was intermittent.  He reports that he has had a decreased appetite and not eaten in the last few days secondary to that decreased appetite.  Has had no nausea or vomiting.  He states that he had a subjective fever at home and denies any rigors.  He lives by himself and a friend will come check on him once a week and he states that he took some Tylenol at home and he felt chilled and use Mucinex for the cough which did not help.  He reports that he has not had any leg swelling and he has not been vaccinated for COVID-19.  Upon arrival to the ED he is found to be tachycardic and tachypneic.  He did have an O2 saturation of 87% on room air.  Is placed back on supplemental oxygen via nasal cannula labs revealed that he leukocytosis and an elevated BNP which was decreased from his baseline of 2000 3000.  Chest x-ray revealed an atypical pneumonia pattern of COVID-19 flu was obtained and negative.  Is admitted for an atypical pneumonia he was initiated on IV antibiotics with vancomycin and cefepime.  Assessment & Plan:   Principal Problem:   Atypical pneumonia Active Problems:   COPD  (chronic obstructive pulmonary disease) (HCC)   Hypertension   Coronary artery disease   CKD (chronic kidney disease) stage 3, GFR 30-59 ml/min (HCC)   Hypoxemia   Prolonged QT interval   HFrEF (heart failure with reduced ejection fraction) (HCC)   Hypokalemia  Atypical pneumonia with acute on chronic hypoxic respiratory failure -Is admitted to the telemetry unit -He was initiated on IV antibiotics with cefepime and Comycin for atypical pneumonia in the light of his COPD -He was given supplemental oxygen via nasal cannula and will need to keep O2 saturation between 92 and 96% -Patient was given incentive spirometry, flutter valve and Robitussin-DM for the cough -Initiate on gentle IV fluid hydration with 50 MLS per hour of lactated Ringer's which will now stop; he received a 1 L of normal saline bolus on admission -Continue with Incruse Ellipta as well as albuterol nebs every 4 as needed for wheezing or shortness of breath -Patient was given furosemide and will stop his fluids and continue furosemide; he received a 1 L bolus in the edition -Currently not wheezing so we will hold off on steroid administration -Chest x-ray showed "Background of emphysema. Diffuse reticular interstitial densities throughout the lungs, new since the prior radiograph and concerning for atypical infiltrate. No consolidative changes. There is no pleural effusion pneumothorax. Stable cardiomediastinal silhouette. The aorta is tortuous. No acute osseous pathology."  -SpO2: 100 % -WBC went from 16.0 is now 15.1 -BNP  was elevated at 1851.3 but this is lower than his baseline -Continuous pulse oximetry maintain O2 saturations as above -Continue supplemental oxygen via nasal cannula wean to home regimen -Patient will need PT OT to further evaluate and treat and will need home ambulatory O2 screen prior to discharge as well as a repeat chest x-ray in a.m.  COPD -Currently not in exacerbation -Continue to monitor  respiratory status carefully and continue with treatment as above and provide supplemental oxygen  Pressure ulcer -Has a stage I decubitus ulcer on the top of his buttocks -present on Admission   Hypokalemia -Mild with a potassium 3.2 -Replete with p.o. KCl 40 mEq twice daily x2 doses -Mag level was 2.1 -Continue monitoring and replete as necessary -Repeat CMP in a.m.  AKI on CKD stage IIIa -Stable and improving -Patient's BUN/creatinine went from 49/1.70 -> 44/1.54 -Avoid nephrotoxic medications, contrast dyes, hypotension and renally adjust medications -Repeat CMP in a.m.  CAD -Continue with atorvastatin 80 mg p.o. daily, carvedilol 6.25 mg p.o. twice daily, clopidogrel 75 mg p.o. daily  Hypertension -Continue with carvedilol 6.25 mg p.o. twice daily as well as furosemide 40 mils p.o. daily -Continue to monitor blood pressures per protocol -Last blood pressure reading was 135/89  Chronic systolic CHF with an EF of 25 to 30% -Patient's BNP was elevated on admission however this is less than his baseline of 2000-3000 -Not overtly volume overloaded and appeared a little dry -Given IV fluid hydration which is now stopped but will resume his home Lasix for now -Strict I's and O's and daily weights -Continue with carvedilol 6.25 mg p.o. twice daily, furosemide 40 mg p.o. daily, and dapagliflozin propanediol 10 mg p.o. daily -Continue to monitor for signs and symptoms of overload   DVT prophylaxis: Enoxaparin 30 mg subcu every 24 Code Status: FULL CODE Family Communication: No family currently at bedside Disposition Plan: Pending clinical improvement in his respiratory status and evaluation by PT and OT  Status is: Inpatient  Remains inpatient appropriate because: The patient continues to be short of breath and will need ambulatory home O2 screen prior to discharge  Consultants:  None  Procedures: None  Antimicrobials:  Anti-infectives (From admission, onward)    Start      Dose/Rate Route Frequency Ordered Stop   04/20/21 0600  vancomycin (VANCOREADY) IVPB 750 mg/150 mL  Status:  Discontinued        750 mg 150 mL/hr over 60 Minutes Intravenous Every 48 hours 04/18/21 0022 04/18/21 0754   04/20/21 0600  vancomycin (VANCOCIN) IVPB 1000 mg/200 mL premix        1,000 mg 200 mL/hr over 60 Minutes Intravenous Every 48 hours 04/18/21 0754     04/18/21 2200  ceFEPIme (MAXIPIME) 2 g in sodium chloride 0.9 % 100 mL IVPB        2 g 200 mL/hr over 30 Minutes Intravenous Every 24 hours 04/18/21 0022     04/18/21 1800  ceFEPIme (MAXIPIME) 2 g in sodium chloride 0.9 % 100 mL IVPB  Status:  Discontinued        2 g 200 mL/hr over 30 Minutes Intravenous Every 24 hours 04/17/21 2355 04/18/21 0022   04/17/21 2315  vancomycin (VANCOCIN) IVPB 1000 mg/200 mL premix        1,000 mg 200 mL/hr over 60 Minutes Intravenous  Once 04/17/21 2301 04/18/21 0118   04/17/21 2315  ceFEPIme (MAXIPIME) 2 g in sodium chloride 0.9 % 100 mL IVPB  2 g 200 mL/hr over 30 Minutes Intravenous  Once 04/17/21 2301 04/17/21 2358        Subjective: Seen and examined at bedside he is agitated that his meal tray got taken away.  No nausea or vomiting.  Was still a little short of breath but thinks he is improving a little bit.  States that he has been short of breath and unable to cough up his sputum appropriately.  Denies any other concerns or complaints at this time.  Objective: Vitals:   04/18/21 0500 04/18/21 0915 04/18/21 1215 04/18/21 1500  BP: (!) 117/92 114/71 104/82 135/89  Pulse: 79 82 71 74  Resp: (!) 27 (!) 23 17 (!) 21  Temp:      TempSrc:      SpO2: 100% 100% 100% 100%   No intake or output data in the 24 hours ending 04/18/21 1628 There were no vitals filed for this visit.  Examination: Physical Exam:  Constitutional: Thin cachectic chronically ill-appearing Caucasian male currently no acute distress  Eyes: Lids and conjunctivae normal, sclerae anicteric  ENMT:  External Ears, Nose appear normal. Grossly normal hearing. Neck: Appears normal, supple, no cervical masses, normal ROM, no appreciable thyromegaly; no appreciable JVD Respiratory: Diminished to auscultation bilaterally with coarse breath sounds and some rhonchi and some slight crackles; no appreciable rales or wheezing noted . Normal respiratory effort and patient is not tachypenic. No accessory muscle use.  Unlabored breathing and was wearing supplemental oxygen via nasal cannula Cardiovascular: RRR, no murmurs / rubs / gallops. S1 and S2 auscultated. No extremity edema.  Abdomen: Soft, non-tender, non-distended.  Bowel sounds positive.  GU: Deferred. Musculoskeletal: No clubbing / cyanosis of digits/nails. No joint deformity upper and lower extremities.  Has significantly decreased muscle tone Skin: No rashes, lesions, ulcers has a stage I decubitus ulcer. No induration; Warm and dry.  Neurologic: CN 2-12 grossly intact with no focal deficits. Romberg sign and cerebellar reflexes not assessed.  Psychiatric: Normal judgment and insight. Alert and oriented x 3. Normal mood and appropriate affect.   Data Reviewed: I have personally reviewed following labs and imaging studies  CBC: Recent Labs  Lab 04/17/21 2100 04/18/21 0429  WBC 16.0* 15.1*  NEUTROABS 14.6*  --   HGB 14.8 13.0  HCT 44.7 39.1  MCV 86.0 85.9  PLT 220 195   Basic Metabolic Panel: Recent Labs  Lab 04/17/21 2100 04/18/21 0011 04/18/21 0429  NA 136  --  136  K 3.1*  --  3.2*  CL 92*  --  95*  CO2 31  --  29  GLUCOSE 98  --  81  BUN 49*  --  44*  CREATININE 1.70*  --  1.54*  CALCIUM 9.0  --  7.4*  MG  --  2.1  --    GFR: CrCl cannot be calculated (Unknown ideal weight.). Liver Function Tests: Recent Labs  Lab 04/17/21 2100  AST 24  ALT 14  ALKPHOS 114  BILITOT 2.0*  PROT 6.8  ALBUMIN 2.5*   No results for input(s): LIPASE, AMYLASE in the last 168 hours. No results for input(s): AMMONIA in the last 168  hours. Coagulation Profile: Recent Labs  Lab 04/17/21 2100  INR 1.0   Cardiac Enzymes: No results for input(s): CKTOTAL, CKMB, CKMBINDEX, TROPONINI in the last 168 hours. BNP (last 3 results) No results for input(s): PROBNP in the last 8760 hours. HbA1C: No results for input(s): HGBA1C in the last 72 hours. CBG: No results for input(s):  GLUCAP in the last 168 hours. Lipid Profile: No results for input(s): CHOL, HDL, LDLCALC, TRIG, CHOLHDL, LDLDIRECT in the last 72 hours. Thyroid Function Tests: No results for input(s): TSH, T4TOTAL, FREET4, T3FREE, THYROIDAB in the last 72 hours. Anemia Panel: No results for input(s): VITAMINB12, FOLATE, FERRITIN, TIBC, IRON, RETICCTPCT in the last 72 hours. Sepsis Labs: Recent Labs  Lab 04/17/21 2100 04/18/21 0230  LATICACIDVEN 1.5 1.4    Recent Results (from the past 240 hour(s))  Resp Panel by RT-PCR (Flu A&B, Covid) Nasopharyngeal Swab     Status: None   Collection Time: 04/17/21  8:31 PM   Specimen: Nasopharyngeal Swab; Nasopharyngeal(NP) swabs in vial transport medium  Result Value Ref Range Status   SARS Coronavirus 2 by RT PCR NEGATIVE NEGATIVE Final    Comment: (NOTE) SARS-CoV-2 target nucleic acids are NOT DETECTED.  The SARS-CoV-2 RNA is generally detectable in upper respiratory specimens during the acute phase of infection. The lowest concentration of SARS-CoV-2 viral copies this assay can detect is 138 copies/mL. A negative result does not preclude SARS-Cov-2 infection and should not be used as the sole basis for treatment or other patient management decisions. A negative result may occur with  improper specimen collection/handling, submission of specimen other than nasopharyngeal swab, presence of viral mutation(s) within the areas targeted by this assay, and inadequate number of viral copies(<138 copies/mL). A negative result must be combined with clinical observations, patient history, and epidemiological information.  The expected result is Negative.  Fact Sheet for Patients:  BloggerCourse.com  Fact Sheet for Healthcare Providers:  SeriousBroker.it  This test is no t yet approved or cleared by the [place] FDA and  has been authorized for detection and/or diagnosis of SARS-CoV-2 by FDA under an Emergency Use Authorization (EUA). This EUA will remain  in effect (meaning this test can be used) for the duration of the COVID-19 declaration under Section 564(b)(1) of the Act, 21 U.S.C.section 360bbb-3(b)(1), unless the authorization is terminated  or revoked sooner.       Influenza A by PCR NEGATIVE NEGATIVE Final   Influenza B by PCR NEGATIVE NEGATIVE Final    Comment: (NOTE) The Xpert Xpress SARS-CoV-2/FLU/RSV plus assay is intended as an aid in the diagnosis of influenza from Nasopharyngeal swab specimens and should not be used as a sole basis for treatment. Nasal washings and aspirates are unacceptable for Xpert Xpress SARS-CoV-2/FLU/RSV testing.  Fact Sheet for Patients: BloggerCourse.com  Fact Sheet for Healthcare Providers: SeriousBroker.it  This test is not yet approved or cleared by the [place] FDA and has been authorized for detection and/or diagnosis of SARS-CoV-2 by FDA under an Emergency Use Authorization (EUA). This EUA will remain in effect (meaning this test can be used) for the duration of the COVID-19 declaration under Section 564(b)(1) of the Act, 21 U.S.C. section 360bbb-3(b)(1), unless the authorization is terminated or revoked.  Performed at [facility], [address], [place], [place] [address]   Blood culture (routine x 2)     Status: None (Preliminary result)   Collection Time: 04/17/21  9:00 PM   Specimen: BLOOD RIGHT ARM  Result Value Ref Range Status   Specimen Description BLOOD RIGHT ARM  Final   Special Requests   Final    BOTTLES DRAWN  AEROBIC AND ANAEROBIC Blood Culture adequate volume   Culture   Final    NO GROWTH < 12 HOURS Performed at [facility], [address], [place], [place] [address]  Report Status PENDING  Incomplete  Blood culture (routine x 2)     Status: None (Preliminary result)   Collection Time: 04/17/21  9:00 PM   Specimen: BLOOD  Result Value Ref Range Status   Specimen Description BLOOD RIGHT ANTECUBITAL  Final   Special Requests AEROBIC BOTTLE ONLY Blood Culture adequate volume  Final   Culture   Final    NO GROWTH < 12 HOURS Performed at [facility], [address], [place], [place] [address]    Report Status PENDING  Incomplete    RN Pressure Injury Documentation:     Estimated body mass index is 15.58 kg/m as calculated from the following:   Height as of 02/14/21: 5\' 10"  (1.778 m).   Weight as of 02/14/21: 49.3 kg.  Malnutrition Type:   Malnutrition Characteristics:   Nutrition Interventions:    Radiology Studies: DG Chest Port 1 View  Result Date: 04/17/2021 CLINICAL DATA:  Questionable sepsis.  Evaluate for abnormality. EXAM: PORTABLE CHEST 1 VIEW COMPARISON:  Chest radiograph dated 12/12/2020. FINDINGS: Background of emphysema. Diffuse reticular interstitial densities throughout the lungs, new since the prior radiograph and concerning for atypical infiltrate. No consolidative changes. There is no pleural effusion pneumothorax. Stable cardiomediastinal silhouette. The aorta is tortuous. No acute osseous pathology. IMPRESSION: Diffuse reticular interstitial densities throughout the lungs concerning for atypical infiltrate. Electronically Signed   By: 12/14/2020 M.D.   On: 04/17/2021 20:58    Scheduled Meds:  atorvastatin  80 mg Oral q1800   carvedilol  6.25 mg Oral BID WC   clopidogrel  75 mg Oral Daily   dapagliflozin propanediol  10 mg Oral Daily   enoxaparin (LOVENOX) injection  30 mg Subcutaneous Q24H   furosemide  40 mg Oral Daily   mirtazapine  7.5 mg  Oral QHS   umeclidinium bromide  1 puff Inhalation Daily   Continuous Infusions:  ceFEPime (MAXIPIME) IV     lactated ringers 50 mL/hr at 04/18/21 0008   [START ON 04/20/2021] vancomycin      LOS: 1 day   04/22/2021, DO [facility] PAGER is on AMION  If 7PM-7AM, please contact night-coverage [email or web address]

## 2021-04-19 ENCOUNTER — Inpatient Hospital Stay (HOSPITAL_COMMUNITY): Payer: 59

## 2021-04-19 DIAGNOSIS — J189 Pneumonia, unspecified organism: Secondary | ICD-10-CM | POA: Diagnosis not present

## 2021-04-19 DIAGNOSIS — J449 Chronic obstructive pulmonary disease, unspecified: Secondary | ICD-10-CM | POA: Diagnosis not present

## 2021-04-19 DIAGNOSIS — N1831 Chronic kidney disease, stage 3a: Secondary | ICD-10-CM | POA: Diagnosis not present

## 2021-04-19 DIAGNOSIS — I10 Essential (primary) hypertension: Secondary | ICD-10-CM | POA: Diagnosis not present

## 2021-04-19 DIAGNOSIS — R9431 Abnormal electrocardiogram [ECG] [EKG]: Secondary | ICD-10-CM

## 2021-04-19 LAB — COMPREHENSIVE METABOLIC PANEL
ALT: 11 U/L (ref 0–44)
AST: 21 U/L (ref 15–41)
Albumin: 1.9 g/dL — ABNORMAL LOW (ref 3.5–5.0)
Alkaline Phosphatase: 87 U/L (ref 38–126)
Anion gap: 8 (ref 5–15)
BUN: 41 mg/dL — ABNORMAL HIGH (ref 8–23)
CO2: 30 mmol/L (ref 22–32)
Calcium: 8 mg/dL — ABNORMAL LOW (ref 8.9–10.3)
Chloride: 98 mmol/L (ref 98–111)
Creatinine, Ser: 1.5 mg/dL — ABNORMAL HIGH (ref 0.61–1.24)
GFR, Estimated: 48 mL/min — ABNORMAL LOW (ref >60)
Glucose, Bld: 118 mg/dL — ABNORMAL HIGH (ref 70–99)
Potassium: 3 mmol/L — ABNORMAL LOW (ref 3.5–5.1)
Sodium: 136 mmol/L (ref 135–145)
Total Bilirubin: 1 mg/dL (ref 0.3–1.2)
Total Protein: 5.1 g/dL — ABNORMAL LOW (ref 6.5–8.1)

## 2021-04-19 LAB — MAGNESIUM: Magnesium: 2 mg/dL (ref 1.7–2.4)

## 2021-04-19 LAB — MRSA NEXT GEN BY PCR, NASAL: MRSA by PCR Next Gen: NOT DETECTED

## 2021-04-19 LAB — CBC WITH DIFFERENTIAL/PLATELET
Abs Immature Granulocytes: 0.08 10*3/uL — ABNORMAL HIGH (ref 0.00–0.07)
Basophils Absolute: 0 10*3/uL (ref 0.0–0.1)
Basophils Relative: 0 %
Eosinophils Absolute: 0 10*3/uL (ref 0.0–0.5)
Eosinophils Relative: 0 %
HCT: 36.1 % — ABNORMAL LOW (ref 39.0–52.0)
Hemoglobin: 11.7 g/dL — ABNORMAL LOW (ref 13.0–17.0)
Immature Granulocytes: 1 %
Lymphocytes Relative: 2 %
Lymphs Abs: 0.2 10*3/uL — ABNORMAL LOW (ref 0.7–4.0)
MCH: 28.2 pg (ref 26.0–34.0)
MCHC: 32.4 g/dL (ref 30.0–36.0)
MCV: 87 fL (ref 80.0–100.0)
Monocytes Absolute: 0.8 10*3/uL (ref 0.1–1.0)
Monocytes Relative: 6 %
Neutro Abs: 12.5 10*3/uL — ABNORMAL HIGH (ref 1.7–7.7)
Neutrophils Relative %: 91 %
Platelets: 191 10*3/uL (ref 150–400)
RBC: 4.15 MIL/uL — ABNORMAL LOW (ref 4.22–5.81)
RDW: 15.3 % (ref 11.5–15.5)
WBC: 13.6 10*3/uL — ABNORMAL HIGH (ref 4.0–10.5)
nRBC: 0 % (ref 0.0–0.2)

## 2021-04-19 LAB — PHOSPHORUS: Phosphorus: 1.7 mg/dL — ABNORMAL LOW (ref 2.5–4.6)

## 2021-04-19 MED ORDER — K PHOS MONO-SOD PHOS DI & MONO 155-852-130 MG PO TABS
500.0000 mg | ORAL_TABLET | Freq: Two times a day (BID) | ORAL | Status: AC
  Administered 2021-04-19: 500 mg via ORAL
  Filled 2021-04-19 (×4): qty 2

## 2021-04-19 MED ORDER — POTASSIUM CHLORIDE CRYS ER 20 MEQ PO TBCR
40.0000 meq | EXTENDED_RELEASE_TABLET | Freq: Two times a day (BID) | ORAL | Status: AC
  Administered 2021-04-19: 20 meq via ORAL
  Administered 2021-04-19: 40 meq via ORAL
  Filled 2021-04-19 (×3): qty 2

## 2021-04-19 NOTE — Progress Notes (Signed)
PROGRESS NOTE    Ray Smith  JQD:[id number] DOB: 09/13/1945 DOA: 04/17/2021 PCP: Caesar Bookman, NP   Brief Narrative:  Patient is a 75 year old thin cachectic chronically ill-appearing Caucasian male with a past medical history significant for but not limited to systolic CHF with an EF of 25 to 30%, COPD with questionable amount of oxygen that he is on and is reported that he is on as high as 5 L, CAD with history of STEMI, HLD, hypertension, marijuana abuse, tobacco dependence, history of nephrolithiasis as well as other comorbidities who presented for worsening evaluation of shortness of breath cough.  Patient states that for the last 4 to 5 days he had worsening shortness of breath that is exacerbated by ambulation and activity.  He also notes increased sputum production with increased coughing with green phlegm that was intermittent.  He reports that he has had a decreased appetite and not eaten in the last few days secondary to that decreased appetite.  Has had no nausea or vomiting.  He states that he had a subjective fever at home and denies any rigors.  He lives by himself and a friend will come check on him once a week and he states that he took some Tylenol at home and he felt chilled and use Mucinex for the cough which did not help.  He reports that he has not had any leg swelling and he has not been vaccinated for COVID-19.  Upon arrival to the ED he is found to be tachycardic and tachypneic.  He did have an O2 saturation of 87% on room air.  Is placed back on supplemental oxygen via nasal cannula labs revealed that he leukocytosis and an elevated BNP which was decreased from his baseline of 2000 3000.  Chest x-ray revealed an atypical pneumonia pattern of COVID-19 flu was obtained and negative.  Is admitted for an atypical pneumonia he was initiated on IV antibiotics with vancomycin and cefepime.  Assessment & Plan:   Principal Problem:   Atypical pneumonia Active Problems:   COPD  (chronic obstructive pulmonary disease) (HCC)   Hypertension   Coronary artery disease   CKD (chronic kidney disease) stage 3, GFR 30-59 ml/min (HCC)   Hypoxemia   Prolonged QT interval   HFrEF (heart failure with reduced ejection fraction) (HCC)   Hypokalemia  Atypical pneumonia with acute on chronic hypoxic respiratory failure -Is admitted to the telemetry unit -He was initiated on IV antibiotics with Cefepime and IV Vancomcyin for atypical pneumonia in the light of his COPD -He was given supplemental oxygen via nasal cannula and will need to keep O2 saturation between 92 and 96% -Patient was given incentive spirometry, flutter valve and Robitussin-DM for the cough -Initiate on gentle IV fluid hydration with 50 MLS per hour of lactated Ringer's which will now stop; he received a 1 L of normal saline bolus on admission -Continue with Incruse Ellipta as well as albuterol nebs every 4 as needed for wheezing or shortness of breath -Patient was given furosemide and will stop his fluids and continue furosemide; he received a 1 L bolus in the ED -Currently not wheezing so we will hold off on steroid administration -Chest x-ray today showed "The appearance of the chest is concerning for multilobar bronchopneumonia, most severe throughout the right mid to lower lung. Small bilateral pleural effusions (right greater than left). Mild cardiomegaly. Aortic atherosclerosis."  -SpO2: 99 % O2 Flow Rate (L/min): 4 L/min -WBC went from 16.0 -> 15.1 -> 13.6 -BNP  was elevated at 1851.3 but this is lower than his baseline -Continuous pulse oximetry maintain O2 saturations as above -Continue supplemental oxygen via nasal cannula wean to home regimen -Patient will need PT OT to further evaluate and treat and will need home ambulatory O2 screen prior to discharge as well as a repeat chest x-ray in a.m.  COPD -Currently not in exacerbation -Continue to monitor respiratory status carefully and continue with  treatment as above and provide supplemental oxygen -SpO2: 99 % O2 Flow Rate (L/min): 4 L/min  Pressure ulcer -Has a stage I decubitus ulcer on the top of his buttocks -present on Admission   Asymptomatic bacteriuria -UA unremarkable but Urine Cx showed  20,000 COLONIES/mL ESCHERICHIA COLI  10,000 COLONIES/mL PROTEUS MIRABILIS  -Currently not exhibiting any signs and symptoms of dysuria -Regardless he is on antibiotics as above for his pneumonia  Hypokalemia -Mild with a potassium 3.0 -Replete with p.o. KCl 40 mEq twice daily x2 doses and po K Phos Neutral 500 mg BID x2 -Mag level was 2.0 -Continue monitoring and replete as necessary -Repeat CMP in a.m.  Hypophosphatemia -Patient's Phos level was 1.7 -Replete with with po K Phos Neutral 500 mg x2 -Continue to Monitor and Replete as Necessary  -Repeat CMP in the AM   AKI on CKD stage IIIa -Stable and improving -Patient's BUN/creatinine went from 49/1.70 -> 44/1.54 -> 41/1.50 -Avoid nephrotoxic medications, contrast dyes, hypotension and renally adjust medications -Repeat CMP in a.m.  Normocytic Anemia/Anemia of Chronic Kidney Disease  -Patient's Hgb/Hct went from 13.0/39.1 -> 11.7/36.1 -Check Anemia Panel in the AM -Continue to Monitor for S/Sx of Bleeding; No overt Bleeding noted -Repeat CBC in the AM   CAD -Continue with Atorvastatin 80 mg p.o. daily, Carvedilol 6.25 mg p.o. twice daily, Clopidogrel 75 mg p.o. daily  Hypertension -Continue with carvedilol 6.25 mg p.o. twice daily as well as furosemide 40 mils p.o. daily -Continue to monitor blood pressures per protocol -Last blood pressure reading was 105/80  Chronic Systolic CHF with an EF of 25 to 30% -Patient's BNP was elevated on admission however this is less than his baseline of 2000-3000 -Not overtly volume overloaded and appeared a little dry -Given IV fluid hydration which is now stopped but will resume his home Lasix for now -Strict I's and O's and daily  weights; NO I's and O's or Weights done -Continue with carvedilol 6.25 mg p.o. twice daily, furosemide 40 mg p.o. daily, and dapagliflozin propanediol 10 mg p.o. daily -Continue to monitor for signs and symptoms of overload -Repeat CXR in the AM   DVT prophylaxis: Enoxaparin 30 mg subcu every 24 Code Status: FULL CODE Family Communication: No family currently at bedside Disposition Plan: Pending clinical improvement in his respiratory status and evaluation by PT and OT  Status is: Inpatient  Remains inpatient appropriate because: The patient continues to be short of breath and will need ambulatory home O2 screen prior to discharge  Consultants:  None  Procedures: None  Antimicrobials:  Anti-infectives (From admission, onward)    Start     Dose/Rate Route Frequency Ordered Stop   04/20/21 0600  vancomycin (VANCOREADY) IVPB 750 mg/150 mL  Status:  Discontinued        750 mg 150 mL/hr over 60 Minutes Intravenous Every 48 hours 04/18/21 0022 04/18/21 0754   04/20/21 0600  vancomycin (VANCOCIN) IVPB 1000 mg/200 mL premix        1,000 mg 200 mL/hr over 60 Minutes Intravenous Every 48 hours 04/18/21 0754  04/18/21 2200  ceFEPIme (MAXIPIME) 2 g in sodium chloride 0.9 % 100 mL IVPB        2 g 200 mL/hr over 30 Minutes Intravenous Every 24 hours 04/18/21 0022     04/18/21 1800  ceFEPIme (MAXIPIME) 2 g in sodium chloride 0.9 % 100 mL IVPB  Status:  Discontinued        2 g 200 mL/hr over 30 Minutes Intravenous Every 24 hours 04/17/21 2355 04/18/21 0022   04/17/21 2315  vancomycin (VANCOCIN) IVPB 1000 mg/200 mL premix        1,000 mg 200 mL/hr over 60 Minutes Intravenous  Once 04/17/21 2301 04/18/21 0118   04/17/21 2315  ceFEPIme (MAXIPIME) 2 g in sodium chloride 0.9 % 100 mL IVPB        2 g 200 mL/hr over 30 Minutes Intravenous  Once 04/17/21 2301 04/17/21 2358        Subjective: Seen and examined at bedside he is doing better he says.  Concerned about his dog.  No chest pain or  shortness of breath and does feel better.  Coughing up more sputum.  No lightheadedness or dizziness.  Denies any other concerns or complaints at this time and hopeful to go home tomorrow.  Objective: Vitals:   04/19/21 1210 04/19/21 1345 04/19/21 1358 04/19/21 1510  BP:  114/60 97/79 105/80  Pulse: 76 71 73 75  Resp: (!) 24 (!) 23 (!) 32 20  Temp:      TempSrc:    Axillary  SpO2: 100% 99% 100% 99%   No intake or output data in the 24 hours ending 04/19/21 1620 There were no vitals filed for this visit.  Examination: Physical Exam:  Constitutional: Thin cachectic chronically ill-appearing Caucasian male currently no acute distress Eyes: Lids and conjunctivae normal, sclerae anicteric  ENMT: External Ears, Nose appear normal. Grossly normal hearing. Mucous membranes are moist.  Neck: Appears normal, supple, no cervical masses, normal ROM, no appreciable thyromegaly: No appreciable JVD Respiratory: Diminished to auscultation bilaterally with coarse breath sounds and some rhonchi as well as some crackles. Normal respiratory effort and patient is not tachypenic. No accessory muscle use.  Wearing supplemental oxygen via nasal cannula Cardiovascular: RRR, no murmurs / rubs / gallops. S1 and S2 auscultated. No extremity edema. 2+ pedal pulses. No carotid bruits.  Abdomen: Soft, non-tender, non-distended. No masses palpated. No appreciable hepatosplenomegaly. Bowel sounds positive.  GU: Deferred. Musculoskeletal: No clubbing / cyanosis of digits/nails. No joint deformity upper and lower extremities.  Skin: No rashes, lesions, ulcers on a limited skin evaluation. No induration; Warm and dry.  Neurologic: CN 2-12 grossly intact with no focal deficits. Romberg sign and cerebellar reflexes not assessed.  Psychiatric: Normal judgment and insight. Alert and oriented x 3. Normal mood and appropriate affect.   Data Reviewed: I have personally reviewed following labs and imaging studies  CBC: Recent  Labs  Lab 04/17/21 2100 04/18/21 0429 04/19/21 0412  WBC 16.0* 15.1* 13.6*  NEUTROABS 14.6*  --  12.5*  HGB 14.8 13.0 11.7*  HCT 44.7 39.1 36.1*  MCV 86.0 85.9 87.0  PLT 220 195 191    Basic Metabolic Panel: Recent Labs  Lab 04/17/21 2100 04/18/21 0011 04/18/21 0429 04/19/21 0412  NA 136  --  136 136  K 3.1*  --  3.2* 3.0*  CL 92*  --  95* 98  CO2 31  --  29 30  GLUCOSE 98  --  81 118*  BUN 49*  --  44* 41*  CREATININE 1.70*  --  1.54* 1.50*  CALCIUM 9.0  --  7.4* 8.0*  MG  --  2.1  --  2.0  PHOS  --   --   --  1.7*    GFR: CrCl cannot be calculated (Unknown ideal weight.). Liver Function Tests: Recent Labs  Lab 04/17/21 2100 04/19/21 0412  AST 24 21  ALT 14 11  ALKPHOS 114 87  BILITOT 2.0* 1.0  PROT 6.8 5.1*  ALBUMIN 2.5* 1.9*    No results for input(s): LIPASE, AMYLASE in the last 168 hours. No results for input(s): AMMONIA in the last 168 hours. Coagulation Profile: Recent Labs  Lab 04/17/21 2100  INR 1.0    Cardiac Enzymes: No results for input(s): CKTOTAL, CKMB, CKMBINDEX, TROPONINI in the last 168 hours. BNP (last 3 results) No results for input(s): PROBNP in the last 8760 hours. HbA1C: No results for input(s): HGBA1C in the last 72 hours. CBG: No results for input(s): GLUCAP in the last 168 hours. Lipid Profile: No results for input(s): CHOL, HDL, LDLCALC, TRIG, CHOLHDL, LDLDIRECT in the last 72 hours. Thyroid Function Tests: No results for input(s): TSH, T4TOTAL, FREET4, T3FREE, THYROIDAB in the last 72 hours. Anemia Panel: No results for input(s): VITAMINB12, FOLATE, FERRITIN, TIBC, IRON, RETICCTPCT in the last 72 hours. Sepsis Labs: Recent Labs  Lab 04/17/21 2100 04/18/21 0230  LATICACIDVEN 1.5 1.4    Recent Results (from the past 240 hour(s))  Urine Culture     Status: Abnormal (Preliminary result)   Collection Time: 04/17/21  8:30 PM   Specimen: In/Out Cath Urine  Result Value Ref Range Status   Specimen Description IN/OUT  CATH URINE  Final   Special Requests NONE  Final   Culture (A)  Final    20,000 COLONIES/mL ESCHERICHIA COLI 10,000 COLONIES/mL PROTEUS MIRABILIS SUSCEPTIBILITIES TO FOLLOW Performed at [facility], [address] [place], [place] [address]    Report Status PENDING  Incomplete  Resp Panel by RT-PCR (Flu A&B, Covid) Nasopharyngeal Swab     Status: None   Collection Time: 04/17/21  8:31 PM   Specimen: Nasopharyngeal Swab; Nasopharyngeal(NP) swabs in vial transport medium  Result Value Ref Range Status   SARS Coronavirus 2 by RT PCR NEGATIVE NEGATIVE Final    Comment: (NOTE) SARS-CoV-2 target nucleic acids are NOT DETECTED.  The SARS-CoV-2 RNA is generally detectable in upper respiratory specimens during the acute phase of infection. The lowest concentration of SARS-CoV-2 viral copies this assay can detect is 138 copies/mL. A negative result does not preclude SARS-Cov-2 infection and should not be used as the sole basis for treatment or other patient management decisions. A negative result may occur with  improper specimen collection/handling, submission of specimen other than nasopharyngeal swab, presence of viral mutation(s) within the areas targeted by this assay, and inadequate number of viral copies(<138 copies/mL). A negative result must be combined with clinical observations, patient history, and epidemiological information. The expected result is Negative.  Fact Sheet for Patients:  BloggerCourse.com  Fact Sheet for Healthcare Providers:  SeriousBroker.it  This test is no t yet approved or cleared by the [place] FDA and  has been authorized for detection and/or diagnosis of SARS-CoV-2 by FDA under an Emergency Use Authorization (EUA). This EUA will remain  in effect (meaning this test can be used) for the duration of the COVID-19 declaration under Section 564(b)(1) of the Act, 21 U.S.C.section 360bbb-3(b)(1),  unless the authorization is terminated  or revoked sooner.  Influenza A by PCR NEGATIVE NEGATIVE Final   Influenza B by PCR NEGATIVE NEGATIVE Final    Comment: (NOTE) The Xpert Xpress SARS-CoV-2/FLU/RSV plus assay is intended as an aid in the diagnosis of influenza from Nasopharyngeal swab specimens and should not be used as a sole basis for treatment. Nasal washings and aspirates are unacceptable for Xpert Xpress SARS-CoV-2/FLU/RSV testing.  Fact Sheet for Patients: BloggerCourse.com  Fact Sheet for Healthcare Providers: SeriousBroker.it  This test is not yet approved or cleared by the [place] FDA and has been authorized for detection and/or diagnosis of SARS-CoV-2 by FDA under an Emergency Use Authorization (EUA). This EUA will remain in effect (meaning this test can be used) for the duration of the COVID-19 declaration under Section 564(b)(1) of the Act, 21 U.S.C. section 360bbb-3(b)(1), unless the authorization is terminated or revoked.  Performed at [facility], [address], [place], [place] [address]   Blood culture (routine x 2)     Status: None (Preliminary result)   Collection Time: 04/17/21  9:00 PM   Specimen: BLOOD RIGHT ARM  Result Value Ref Range Status   Specimen Description BLOOD RIGHT ARM  Final   Special Requests   Final    BOTTLES DRAWN AEROBIC AND ANAEROBIC Blood Culture adequate volume   Culture   Final    NO GROWTH 2 DAYS Performed at [facility], [address] [place], [place] [address]    Report Status PENDING  Incomplete  Blood culture (routine x 2)     Status: None (Preliminary result)   Collection Time: 04/17/21  9:00 PM   Specimen: BLOOD  Result Value Ref Range Status   Specimen Description BLOOD RIGHT ANTECUBITAL  Final   Special Requests AEROBIC BOTTLE ONLY Blood Culture adequate volume  Final   Culture   Final    NO GROWTH 2 DAYS Performed at [facility], [address], [place], [place] [address]    Report Status PENDING  Incomplete  MRSA Next Gen by PCR, Nasal     Status: None   Collection Time: 04/19/21  1:09 PM   Specimen: Nasal Mucosa; Nasal Swab  Result Value Ref Range Status   MRSA by PCR Next Gen NOT DETECTED NOT DETECTED Final    Comment: (NOTE) The GeneXpert MRSA Assay (FDA approved for NASAL specimens only), is one component of a comprehensive MRSA colonization surveillance program. It is not intended to diagnose MRSA infection nor to guide or monitor treatment for MRSA infections. Test performance is not FDA approved in patients less than 75 years old. Performed at [facility], [address] [place], [place] [address]    RN Pressure Injury Documentation: Pressure Injury 04/19/21 Sacrum Medial Unstageable - Full thickness tissue loss in which the base of the injury is covered by slough (yellow, tan, gray, green or brown) and/or eschar (tan, brown or black) in the wound bed. (Active)  04/19/21 1531  Location: Sacrum  Location Orientation: Medial  Staging: Unstageable - Full thickness tissue loss in which the base of the injury is covered by slough (yellow, tan, gray, green or brown) and/or eschar (tan, brown or black) in the wound bed.  Wound Description (Comments):   Present on Admission: Yes     Estimated body mass index is 15.58 kg/m as calculated from the following:   Height as of 02/14/21: 5\' 10"  (1.778 m).   Weight as of 02/14/21: 49.3 kg.  Malnutrition Type:   Malnutrition Characteristics:  Nutrition Interventions:    Radiology Studies: DG CHEST PORT 1 VIEW  Result Date: 04/19/2021 CLINICAL DATA:  75 year old male with history of shortness of breath. EXAM: PORTABLE CHEST 1 VIEW COMPARISON:  Chest x-ray 04/17/2021. FINDINGS: Severe emphysematous changes are again noted. Diffuse peribronchial cuffing. Patchy multifocal interstitial and airspace disease is noted in the lungs bilaterally, most  severe throughout the right mid to lower lung. Small bilateral pleural effusions (right greater than left). No pneumothorax. No evidence of pulmonary edema. Heart size is mildly enlarged. Upper mediastinal contours are within normal limits. Atherosclerotic calcifications in the thoracic aorta. IMPRESSION: 1. The appearance of the chest is concerning for multilobar bronchopneumonia, most severe throughout the right mid to lower lung. 2. Small bilateral pleural effusions (right greater than left). 3. Mild cardiomegaly. 4. Aortic atherosclerosis. Electronically Signed   By: Trudie Reed M.D.   On: 04/19/2021 06:40   DG Chest Port 1 View  Result Date: 04/17/2021 CLINICAL DATA:  Questionable sepsis.  Evaluate for abnormality. EXAM: PORTABLE CHEST 1 VIEW COMPARISON:  Chest radiograph dated 12/12/2020. FINDINGS: Background of emphysema. Diffuse reticular interstitial densities throughout the lungs, new since the prior radiograph and concerning for atypical infiltrate. No consolidative changes. There is no pleural effusion pneumothorax. Stable cardiomediastinal silhouette. The aorta is tortuous. No acute osseous pathology. IMPRESSION: Diffuse reticular interstitial densities throughout the lungs concerning for atypical infiltrate. Electronically Signed   By: Elgie Collard M.D.   On: 04/17/2021 20:58    Scheduled Meds:  atorvastatin  80 mg Oral q1800   carvedilol  6.25 mg Oral BID WC   clopidogrel  75 mg Oral Daily   dapagliflozin propanediol  10 mg Oral Daily   enoxaparin (LOVENOX) injection  30 mg Subcutaneous Q24H   furosemide  40 mg Oral Daily   guaiFENesin  1,200 mg Oral BID   mirtazapine  7.5 mg Oral QHS   phosphorus  500 mg Oral BID   potassium chloride  40 mEq Oral BID   umeclidinium bromide  1 puff Inhalation Daily   Continuous Infusions:  ceFEPime (MAXIPIME) IV Stopped (04/18/21 2140)   [START ON 04/20/2021] vancomycin      LOS: 2 days   Merlene Laughter, DO [facility] PAGER is on AMION  If 7PM-7AM, please contact night-coverage [email or web address]

## 2021-04-19 NOTE — ED Notes (Signed)
Patient refusing to take medications as instructed. Patient states he does not want to wear oxygen at this time. Patient states he will also not eat food with spoon, wants fork that is not available to eat. Cut patient food up to be easier to eat for spoon, refuses still

## 2021-04-19 NOTE — ED Notes (Signed)
Salvadore Farber- DSS of [facility] [place] [phone] Work cell [phone]

## 2021-04-19 NOTE — ED Notes (Signed)
Pt's SpO2 87% on room air, attempted to replace [place], pt declined, instead asked for apple juice.

## 2021-04-20 ENCOUNTER — Inpatient Hospital Stay (HOSPITAL_COMMUNITY): Payer: 59

## 2021-04-20 DIAGNOSIS — J449 Chronic obstructive pulmonary disease, unspecified: Secondary | ICD-10-CM | POA: Diagnosis not present

## 2021-04-20 DIAGNOSIS — I5020 Unspecified systolic (congestive) heart failure: Secondary | ICD-10-CM | POA: Diagnosis not present

## 2021-04-20 DIAGNOSIS — J189 Pneumonia, unspecified organism: Secondary | ICD-10-CM | POA: Diagnosis not present

## 2021-04-20 DIAGNOSIS — N1831 Chronic kidney disease, stage 3a: Secondary | ICD-10-CM | POA: Diagnosis not present

## 2021-04-20 LAB — CBC WITH DIFFERENTIAL/PLATELET
Abs Immature Granulocytes: 0.14 10*3/uL — ABNORMAL HIGH (ref 0.00–0.07)
Basophils Absolute: 0 10*3/uL (ref 0.0–0.1)
Basophils Relative: 0 %
Eosinophils Absolute: 0 10*3/uL (ref 0.0–0.5)
Eosinophils Relative: 0 %
HCT: 38.9 % — ABNORMAL LOW (ref 39.0–52.0)
Hemoglobin: 12.7 g/dL — ABNORMAL LOW (ref 13.0–17.0)
Immature Granulocytes: 1 %
Lymphocytes Relative: 2 %
Lymphs Abs: 0.3 10*3/uL — ABNORMAL LOW (ref 0.7–4.0)
MCH: 28.6 pg (ref 26.0–34.0)
MCHC: 32.6 g/dL (ref 30.0–36.0)
MCV: 87.6 fL (ref 80.0–100.0)
Monocytes Absolute: 0.9 10*3/uL (ref 0.1–1.0)
Monocytes Relative: 6 %
Neutro Abs: 13.9 10*3/uL — ABNORMAL HIGH (ref 1.7–7.7)
Neutrophils Relative %: 91 %
Platelets: 216 10*3/uL (ref 150–400)
RBC: 4.44 MIL/uL (ref 4.22–5.81)
RDW: 15.2 % (ref 11.5–15.5)
WBC: 15.2 10*3/uL — ABNORMAL HIGH (ref 4.0–10.5)
nRBC: 0 % (ref 0.0–0.2)

## 2021-04-20 LAB — COMPREHENSIVE METABOLIC PANEL
ALT: 12 U/L (ref 0–44)
AST: 27 U/L (ref 15–41)
Albumin: 2 g/dL — ABNORMAL LOW (ref 3.5–5.0)
Alkaline Phosphatase: 96 U/L (ref 38–126)
Anion gap: 7 (ref 5–15)
BUN: 37 mg/dL — ABNORMAL HIGH (ref 8–23)
CO2: 31 mmol/L (ref 22–32)
Calcium: 8.3 mg/dL — ABNORMAL LOW (ref 8.9–10.3)
Chloride: 100 mmol/L (ref 98–111)
Creatinine, Ser: 1.33 mg/dL — ABNORMAL HIGH (ref 0.61–1.24)
GFR, Estimated: 55 mL/min — ABNORMAL LOW (ref >60)
Glucose, Bld: 151 mg/dL — ABNORMAL HIGH (ref 70–99)
Potassium: 3.5 mmol/L (ref 3.5–5.1)
Sodium: 138 mmol/L (ref 135–145)
Total Bilirubin: 1.1 mg/dL (ref 0.3–1.2)
Total Protein: 5.5 g/dL — ABNORMAL LOW (ref 6.5–8.1)

## 2021-04-20 LAB — URINE CULTURE: Culture: 20000 — AB

## 2021-04-20 LAB — MAGNESIUM: Magnesium: 2 mg/dL (ref 1.7–2.4)

## 2021-04-20 LAB — PHOSPHORUS: Phosphorus: 1.3 mg/dL — ABNORMAL LOW (ref 2.5–4.6)

## 2021-04-20 MED ORDER — POTASSIUM PHOSPHATES 15 MMOLE/5ML IV SOLN
30.0000 mmol | Freq: Once | INTRAVENOUS | Status: AC
  Administered 2021-04-20: 30 mmol via INTRAVENOUS
  Filled 2021-04-20: qty 10

## 2021-04-20 MED ORDER — SODIUM CHLORIDE 0.9 % IV SOLN
2.0000 g | INTRAVENOUS | Status: AC
Start: 2021-04-21 — End: 2021-04-24
  Administered 2021-04-21 – 2021-04-24 (×4): 2 g via INTRAVENOUS
  Filled 2021-04-20 (×4): qty 20

## 2021-04-20 MED ORDER — DOXYCYCLINE HYCLATE 100 MG PO TABS
100.0000 mg | ORAL_TABLET | Freq: Two times a day (BID) | ORAL | Status: AC
  Administered 2021-04-20 – 2021-04-23 (×6): 100 mg via ORAL
  Filled 2021-04-20 (×6): qty 1

## 2021-04-20 MED ORDER — BUDESONIDE 0.25 MG/2ML IN SUSP
0.2500 mg | Freq: Two times a day (BID) | RESPIRATORY_TRACT | Status: DC
  Administered 2021-04-20 – 2021-04-26 (×12): 0.25 mg via RESPIRATORY_TRACT
  Filled 2021-04-20 (×12): qty 2

## 2021-04-20 MED ORDER — ARFORMOTEROL TARTRATE 15 MCG/2ML IN NEBU
15.0000 ug | INHALATION_SOLUTION | Freq: Two times a day (BID) | RESPIRATORY_TRACT | Status: DC
  Administered 2021-04-20 – 2021-04-26 (×12): 15 ug via RESPIRATORY_TRACT
  Filled 2021-04-20 (×12): qty 2

## 2021-04-20 NOTE — Evaluation (Signed)
Physical Therapy Evaluation Patient Details Name: Ray Smith MRN: [id number] DOB: 10/29/1945 Today's Date: 04/20/2021  History of Present Illness  75 year old thin cachectic chronically ill-appearing Caucasian male with a past medical history significant for but not limited to systolic CHF with an EF of 25 to 30%, COPD with O2 at 5 L, CAD with history of STEMI, HLD, hypertension, marijuana abuse, tobacco dependence, history of nephrolithiasis as well as other comorbidities who presented for worsening shortness of breath and cough.   Clinical Impression  Pt admitted with above. Pt with noted SOB and productive cough with all mobility. SpO2 between 77-90% on 3LO2 via [place]. Pt with decreased activity tolerance refusing to amb stating "I can't walk." However stated he was walking "by myself" PTA. Pt with noted difficulty for caring for self as pt weighs less than 100 pounds, has a sacral decub, poor dental hygiene, and decreased insight to deficits both functionally and medically and how that relates to his impaired function. Pt to benefit from SNF upon d/c to progress mobility and achieve safe mod I level of function for safe transition back to motel. Acute PT to cont to follow.     Recommendations for follow up therapy are one component of a multi-disciplinary discharge planning process, led by the attending physician.  Recommendations may be updated based on patient status, additional functional criteria and insurance authorization.  Follow Up Recommendations Skilled nursing-short term rehab (<3 hours/day)    Assistance Recommended at Discharge Frequent or constant Supervision/Assistance  Functional Status Assessment Patient has had a recent decline in their functional status and/or demonstrates limited ability to make significant improvements in function in a reasonable and predictable amount of time  Equipment Recommendations  Rolling walker (2 wheels)    Recommendations for Other Services        Precautions / Restrictions Precautions Precautions: Fall Precaution Comments: watch O2 Restrictions Weight Bearing Restrictions: No      Mobility  Bed Mobility Overal bed mobility: Needs Assistance Bed Mobility: Sit to Supine Rolling: Modified independent (Device/Increase time) Sidelying to sit: Min assist   Sit to supine: Min assist   General bed mobility comments: minA for safety due to impulsivity    Transfers Overall transfer level: Needs assistance Equipment used: 1 person hand held assist Transfers: Sit to/from [employer] Sit to Stand: Min assist Stand pivot transfers: Min assist         General transfer comment: pt adament about not using a RW and stating "just get me up and let me fall in the bed". Pt did power up from chair, reached and held onto PTs arm and took shuffled steps to edge of bed, pt with noted SOB, hard to get a solid waveform but varied from 77-90% on 3LO2 via [place]    Ambulation/Gait             General Gait Details: pt adamantly refused stating "I can't walk just get me back in the bed"  max encouragement provided however pt continued to refuse  Stairs            Wheelchair Mobility    Modified Rankin (Stroke Patients Only)       Balance Overall balance assessment: Needs assistance Sitting-balance support: Feet supported Sitting balance-Leahy Scale: Good     Standing balance support: During functional activity;Single extremity supported Standing balance-Leahy Scale: Poor Standing balance comment: needs external support of PT for std pvt  Pertinent Vitals/Pain Pain Assessment: Faces Faces Pain Scale: Hurts whole lot Pain Location: back side, "all over really" when pt assisted to sitting up in chair, pt grimacing and moaning Pain Descriptors / Indicators: Discomfort;Moaning    Home Living Family/patient expects to be discharged to:: Other (Comment)                    Additional Comments: Currenlty in a motel, main level, was living in a [place] up until 2 months ago and then traded it for a [employer], lives in a [place] for 14 months because he lost his home when was in the hospital    Prior Function Prior Level of Function : Patient poor historian/Family not available             Mobility Comments: pt reports "I can't walk" 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, mental health ADLs Comments: pt reports "I manage" and that he gets food delivered and he cooks it however pt very frail and thin with significant muscle atrophy     Hand Dominance   Dominant Hand: Right    Extremity/Trunk Assessment   Upper Extremity Assessment Upper Extremity Assessment: Generalized weakness    Lower Extremity Assessment Lower Extremity Assessment: Generalized weakness (pt with noted bilat LE muscle atrophy)    Cervical / Trunk Assessment Cervical / Trunk Assessment: Kyphotic  Communication   Communication: HOH  Cognition Arousal/Alertness: Awake/alert Behavior During Therapy: WFL for tasks assessed/performed Overall Cognitive Status: No family/caregiver present to determine baseline cognitive functioning                                 General Comments: oriented to self but kept stating he was 75yo when really he is 75 yo, stating he was in the hospital because he was poisoned by MD, pt with decresaed insight to safety and deficts as well and appears to have self neglect, suspect this is baseline for patient however concerned for pt's capacity to make decisions, messaged Dr. Reece Agar        General Comments General comments (skin integrity, edema, etc.): pt very frail, per chart pt with ulcer at top of sacrum, attempted to view however pt stated "they have a bandage over it and refused to roll", noted SOB, SpO2 77-90% on 3LO2 via [place] however poor waveform    Exercises     Assessment/Plan    PT Assessment Patient needs continued PT  services  PT Problem List Decreased strength;Decreased activity tolerance;Decreased balance;Decreased mobility;Decreased knowledge of use of DME;Decreased safety awareness;Decreased coordination;Decreased cognition;Pain;Decreased skin integrity;Cardiopulmonary status limiting activity       PT Treatment Interventions DME instruction;Gait training;Functional mobility training;Therapeutic activities;Balance training;Therapeutic exercise;Cognitive remediation    PT Goals (Current goals can be found in the Care Plan section)  Acute Rehab PT Goals Patient Stated Goal: home PT Goal Formulation: With patient Time For Goal Achievement: 05/04/21 Potential to Achieve Goals: Fair    Frequency Min 2X/week   Barriers to discharge Decreased caregiver support lives in a motel    Co-evaluation               AM-PAC PT "6 Clicks" Mobility  Outcome Measure Help needed turning from your back to your side while in a flat bed without using bedrails?: A Little Help needed moving from lying on your back to sitting on the side of a flat bed without using bedrails?: A Little Help needed  moving to and from a bed to a chair (including a wheelchair)?: A Lot Help needed standing up from a chair using your arms (e.g., wheelchair or bedside chair)?: A Lot Help needed to walk in hospital room?: A Lot Help needed climbing 3-5 steps with a railing? : Total 6 Click Score: 13    End of Session Equipment Utilized During Treatment: Oxygen Activity Tolerance: Patient limited by fatigue;Treatment limited secondary to medical complications (Comment) (SOB) Patient left: in bed;with call bell/phone within reach;with chair alarm set (pt asked for all rails up) Nurse Communication: Mobility status PT Visit Diagnosis: Unsteadiness on feet (R26.81);Muscle weakness (generalized) (M62.81);Difficulty in walking, not elsewhere classified (R26.2)    Time: 5035-4656 PT Time Calculation (min) (ACUTE ONLY): 22  min   Charges:   PT Evaluation $PT Eval Moderate Complexity: 1 Mod          Lewis Shock, PT, DPT Acute Rehabilitation Services Pager #: [phone] Office #: [phone]   Iona Hansen 04/20/2021, 1:14 PM

## 2021-04-20 NOTE — Evaluation (Addendum)
Occupational Therapy Evaluation Patient Details Name: Ray Smith MRN: [id number] DOB: 1945-10-11 Today's Date: 04/20/2021   History of Present Illness 75 year old thin cachectic chronically ill-appearing Caucasian male with a past medical history significant for but not limited to systolic CHF with an EF of 25 to 30%, COPD with O2 at 5 L, CAD with history of STEMI, HLD, hypertension, marijuana abuse, tobacco dependence, history of nephrolithiasis as well as other comorbidities who presented for worsening shortness of breath and cough.   Clinical Impression   Patient admitted with the above diagnosis.  PTA he is staying in a main level motel with no STE.  He does not drive currently, orders his groceries to be delivered, and has no assist for ADL/IADL.  States he hasn't been able to walk much as of late, and feels weak.  Deficits are listed below.  Currently he is needing up to Min A for ADL from a seated position, and up to Min A for basic mobility.  OT will follow in the acute setting to maximize his functional status, but SNF OT could be considered to ensure a safe transition home, and maintain independence.      Recommendations for follow up therapy are one component of a multi-disciplinary discharge planning process, led by the attending physician.  Recommendations may be updated based on patient status, additional functional criteria and insurance authorization.   Follow Up Recommendations    SNF   Assistance Recommended at Discharge    Functional Status Assessment     Equipment Recommendations    , 2WRW   Recommendations for Other Services       Precautions / Restrictions Precautions Precautions: Fall Precaution Comments: watch O2 Restrictions Weight Bearing Restrictions: No      Mobility Bed Mobility Overal bed mobility: Needs Assistance Bed Mobility: Sit to Supine       Sit to supine: Min assist   General bed mobility comments: minA for safety due to  impulsivity    Transfers Overall transfer level: Needs assistance Equipment used: 1 person hand held assist Transfers: Sit to/from [employer] Sit to Stand: Min assist Stand pivot transfers: Min assist         General transfer comment: pt adament about not using a RW and stating "just get me up and let me fall in the bed". Pt did power up from chair, reached and held onto PTs arm and took shuffled steps to edge of bed, pt with noted SOB, hard to get a solid waveform but varied from 77-90% on 3LO2 via [place]      Balance Overall balance assessment: Needs assistance Sitting-balance support: Feet supported Sitting balance-Leahy Scale: Good     Standing balance support: During functional activity;Single extremity supported Standing balance-Leahy Scale: Poor Standing balance comment: needs external support of PT for std pvt                           ADL either performed or assessed with clinical judgement   ADL                                               Vision         Perception     Praxis      Pertinent Vitals/Pain Pain Assessment: Faces Faces Pain Scale: Hurts whole lot Pain Location:  back side, "all over really" when pt assisted to sitting up in chair, pt grimacing and moaning Pain Descriptors / Indicators: Discomfort;Moaning     Hand Dominance Right   Extremity/Trunk Assessment Upper Extremity Assessment Upper Extremity Assessment: Generalized weakness   Lower Extremity Assessment Lower Extremity Assessment: Generalized weakness (pt with noted bilat LE muscle atrophy)   Cervical / Trunk Assessment Cervical / Trunk Assessment: Kyphotic   Communication Communication Communication: HOH   Cognition Arousal/Alertness: Awake/alert Behavior During Therapy: WFL for tasks assessed/performed Overall Cognitive Status: No family/caregiver present to determine baseline cognitive functioning                                  General Comments: oriented to self but kept stating he was 75yo when really he is 75 yo, stating he was in the hospital because he was poisoned by MD, pt with decresaed insight to safety and deficts as well and appears to have self neglect, suspect this is baseline for patient however concerned for pt's capacity to make decisions, messaged Dr. Reece Agar      pt very frail, per chart pt with ulcer at top of sacrum, attempted to view however pt stated "they have a bandage over it and refused to roll", noted SOB, SpO2 77-90% on 3LO2 via [place] however poor waveform               Home Living Family/patient expects to be discharged to:: Other (Comment)                                 Additional Comments: Currenlty in a motel, main level, was living in a [place] up until 2 months ago and then traded it for a [employer], lives in a [place] for 14 months because he lost his home when was in the hospital      Prior Functioning/Environment Prior Level of Function : Patient poor historian/Family not available             Mobility Comments: pt reports "I can't walk" 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, mental health ADLs Comments: pt reports "I manage" and that he gets food delivered and he cooks it however pt very frail and thin with significant muscle atrophy        OT Problem List:        OT Treatment/Interventions:      OT Goals(Current goals can be found in the care plan section)    OT Frequency:     Barriers to D/C:            Co-evaluation              AM-PAC OT "6 Clicks" Daily Activity     Outcome Measure                 End of Session    Activity Tolerance:   Patient left:                     Time:  -    Charges:     04/20/2021  RP, OTR/L  Acute Rehabilitation Services  Office:  [phone]   Suzanna Obey 04/20/2021, 1:37 PM

## 2021-04-20 NOTE — Care Management Important Message (Signed)
Important Message  Patient Details  Name: Ray Smith MRN: [id number] Date of Birth: 07-30-1945   Medicare Important Message Given:  Yes     Rakeya Glab 04/20/2021, 2:10 PM

## 2021-04-20 NOTE — TOC Initial Note (Signed)
Transition of Care [facility]) - Initial/Assessment Note    Patient Details  Name: Ray Smith MRN: [id number] Date of Birth: [birth year]  [facility] CM/SW Contact:    Mearl Latin, LCSW Phone Number: 04/20/2021, 4:55 PM  Clinical Narrative:                 CSW and MSW Intern spoke with patient regarding SNF recommendation. Patient stated that he absolutely would not go to SNF even for 1 week because he has a home at the motel. CSW asked what he would do if he was still unable to walk and he said he would be able to walk soon and that the hospital staff are helping him. CSW reported understanding but that therapy could not see him everyday. He stated he had people at the motel that would help him but that he would need his nebulizer meds filled at the hospital and a walker. CSW asked if he would be willing to have home health come to the motel but he stated he would not need that at discharge. He could not tell CSW the address of the motel but stated that patient could have his daughter in law Maralyn Sago call CSW when she came to the hospital to visit in a while. CSW left him contact info.   Expected Discharge Plan: Home/Self Care Barriers to Discharge: Continued Medical Work up   Patient Goals and CMS Choice Patient states their goals for this hospitalization and ongoing recovery are:: Get stronger CMS Medicare.gov Compare Post Acute Care list provided to:: Patient Choice offered to / list presented to : Patient  Expected Discharge Plan and Services Expected Discharge Plan: Home/Self Care In-house Referral: Clinical Social Work   Post Acute Care Choice: Durable Medical Equipment Living arrangements for the past 2 months: Hotel/Motel                                      Prior Living Arrangements/Services Living arrangements for the past 2 months: Hotel/Motel Lives with:: Self Patient language and need for interpreter reviewed:: Yes Do you feel safe going back to the  place where you live?: Yes      Need for Family Participation in Patient Care: Yes (Comment) Care giver support system in place?: Yes (comment)   Criminal Activity/Legal Involvement Pertinent to Current Situation/Hospitalization: No - Comment as needed  Activities of Daily Living      Permission Sought/Granted Permission sought to share information with : Facility Medical sales representative, Family Supports Permission granted to share information with : Yes, Verbal Permission Granted              Emotional Assessment Appearance:: Appears stated age Attitude/Demeanor/Rapport: Engaged Affect (typically observed): Accepting, Appropriate Orientation: : Oriented to Self, Oriented to Place, Oriented to  Time, Oriented to Situation (Hard of Hearing) Alcohol / Substance Use: Not Applicable Psych Involvement: No (comment)  Admission diagnosis:  SOB (shortness of breath) [R06.02] Atypical pneumonia [J18.9] Patient Active Problem List   Diagnosis Date Noted   Atypical pneumonia 04/17/2021   CKD (chronic kidney disease) stage 3, GFR 30-59 ml/min (HCC) 04/17/2021   Hypoxemia 04/17/2021   Prolonged QT interval 04/17/2021   HFrEF (heart failure with reduced ejection fraction) (HCC) 04/17/2021   Hypokalemia 04/17/2021   Former cigarette smoker 02/14/2021   Congestive heart failure (CHF) (HCC)    Coronary artery disease    Hypertension  Acute decompensated heart failure (HCC) 11/02/2020   PVD (peripheral vascular disease) (HCC) 11/02/2020   COPD with acute exacerbation (HCC) 09/03/2019   Acute exacerbation of CHF (congestive heart failure) (HCC) 09/02/2019   Adjustment disorder with mixed disturbance of emotions and conduct    Protein-calorie malnutrition, severe 01/10/2018   Orthostatic hypotension 01/09/2018   Weakness 01/09/2018   Mouth pain 01/09/2018   CKD (chronic kidney disease) stage 2, GFR 60-89 ml/min 01/09/2018   Marijuana abuse 01/09/2018   Acute systolic heart failure  (HCC) 52/84/1324   STEMI (ST elevation myocardial infarction) (HCC) 12/30/2017   STEMI involving left anterior descending coronary artery (HCC) 12/30/2017   Acute encephalopathy 10/06/2017   AKI (acute kidney injury) (HCC)    Altered mental status    Hypertension 11/12/2013   Dyslipidemia 10/24/2013   ACS (acute coronary syndrome) (HCC) 10/23/2013   Chest pain 10/23/2013   Coronary Artery Disease 10/23/2013   COPD (chronic obstructive pulmonary disease) (HCC) 10/23/2013   Tobacco abuse 10/23/2013   Precordial pain 10/23/2013   PCP:  Caesar Bookman, NP Pharmacy:   [facility] [phone] - [place], [place] - [address] AT [address] [place] [address] Phone: [phone] Fax: [phone]     Social Determinants of Health (SDOH) Interventions    Readmission Risk Interventions Readmission Risk Prevention Plan 04/20/2021 11/17/2020  Transportation Screening Complete Complete  PCP or Specialist Appt within 3-5 Days - Complete  HRI or Home Care Consult - Complete  Palliative Care Screening - Not Applicable  Medication Review (RN Care Manager) Complete -  PCP or Specialist appointment within 3-5 days of discharge Complete -  HRI or Home Care Consult Complete -  SW Recovery Care/Counseling Consult Complete -  Palliative Care Screening Not Applicable -  Skilled Nursing Facility Patient Refused -  Some recent data might be hidden

## 2021-04-20 NOTE — Progress Notes (Signed)
PROGRESS NOTE        PATIENT DETAILS Name: Ray Smith Age: 75 y.o. Sex: male Date of Birth: February 24, 1946 Admit Date: 04/17/2021 Admitting Physician Carlton Adam, MD [id number], Donalee Citrin, NP  Brief Narrative: Patient is a 75 y.o. male with history of HFrEF, COPD-on home O2, CAD, HTN, HLD-who presented with shortness of breath-patient was found to have acute on chronic hypoxic respiratory failure due to community-acquired pneumonia.  See below for further details.  Subjective: Lying comfortably in bed-denies any chest pain or shortness of breath.  Looks very frail and weak.  Objective: Vitals: Blood pressure 110/71, pulse 82, temperature 97.6 F (36.4 C), temperature source Oral, resp. rate (!) 25, weight 44.1 kg, SpO2 100 %.   Exam: Gen Exam: Not in any distress-appears frail/chronically sick appearing. HEENT:atraumatic, normocephalic Chest: B/L clear to auscultation anteriorly CVS:S1S2 regular Abdomen:soft non tender, non distended Extremities:no edema Neurology: Non focal-generalized weakness Skin: no rash  Pertinent Labs/Radiology: WBC: 15.2 Phosphorus: 1.3  10/23>>Blood culture: No growth 10/23>>Urine Culture: E. coli/Proteus (both<100,000 colonies)  10/26>>CXR: Bilateral patchy airspace opacities. 05/11>> Echo: EF 25-30%.   Assessment/Plan: Acute on chronic hypoxic respiratory failure due to community-acquired pneumonia: Clinically slowly improving-poor historian-claims he is on home O2-but apparently only uses it at night.  Blood cultures negative so far-continue empiric antibiotics-but suspect we can narrow down to Rocephin/doxycycline.  Follow clinical course.   AKI on CKD stage IIIa: AKI hemodynamically mediated-improving with supportive care.  Asymptomatic bacteriuria: Not felt to have UTI-he has no symptoms-urine cultures are of no clinical relevance.  COPD: Not in exacerbation-continue bronchodilators-add Brovana/steroid  nebs.  Continue Incruse.  HFrEF: Not in exacerbation-volume status stable-continue Lasix  CAD: No anginal symptoms-continue Plavix/Coreg/statin  HTN: BP stable-continue Coreg/Lasix  Hypophosphatemia: Replete and recheck  Debility/deconditioning: Appears weak-agree with PT-likely will benefit from SNF  Homelessness: Lives in a long-term motel   Procedures: None Consults: None DVT Prophylaxis: Lovenox Code Status:Full code  Family Communication: None at bedside  Time spent: 25- minutes-Greater than 50% of this time was spent in counseling, explanation of diagnosis, planning of further management, and coordination of care.  Diet: Diet Order             Diet Heart Room service appropriate? Yes; Fluid consistency: Thin  Diet effective now                      Disposition Plan: Status is: Inpatient  Remains inpatient appropriate because: Requires inpatient level of care given acuity of illness.    Barriers to Discharge: Hypoxia from PNA-on oxygen and IV antibiotics.  Not yet stable for discharge-likely will require SNF.  Antimicrobial agents: Anti-infectives (From admission, onward)    Start     Dose/Rate Route Frequency Ordered Stop   04/20/21 0600  vancomycin (VANCOREADY) IVPB 750 mg/150 mL  Status:  Discontinued        750 mg 150 mL/hr over 60 Minutes Intravenous Every 48 hours 04/18/21 0022 04/18/21 0754   04/20/21 0600  vancomycin (VANCOCIN) IVPB 1000 mg/200 mL premix        1,000 mg 200 mL/hr over 60 Minutes Intravenous Every 48 hours 04/18/21 0754     04/18/21 2200  ceFEPIme (MAXIPIME) 2 g in sodium chloride 0.9 % 100 mL IVPB        2 g 200 mL/hr over 30 Minutes Intravenous  Every 24 hours 04/18/21 0022     04/18/21 1800  ceFEPIme (MAXIPIME) 2 g in sodium chloride 0.9 % 100 mL IVPB  Status:  Discontinued        2 g 200 mL/hr over 30 Minutes Intravenous Every 24 hours 04/17/21 2355 04/18/21 0022   04/17/21 2315  vancomycin (VANCOCIN) IVPB 1000 mg/200 mL  premix        1,000 mg 200 mL/hr over 60 Minutes Intravenous  Once 04/17/21 2301 04/18/21 0118   04/17/21 2315  ceFEPIme (MAXIPIME) 2 g in sodium chloride 0.9 % 100 mL IVPB        2 g 200 mL/hr over 30 Minutes Intravenous  Once 04/17/21 2301 04/17/21 2358        MEDICATIONS: Scheduled Meds:  atorvastatin  80 mg Oral q1800   carvedilol  6.25 mg Oral BID WC   clopidogrel  75 mg Oral Daily   dapagliflozin propanediol  10 mg Oral Daily   enoxaparin (LOVENOX) injection  30 mg Subcutaneous Q24H   furosemide  40 mg Oral Daily   guaiFENesin  1,200 mg Oral BID   mirtazapine  7.5 mg Oral QHS   umeclidinium bromide  1 puff Inhalation Daily   Continuous Infusions:  ceFEPime (MAXIPIME) IV 2 g (04/19/21 2211)   vancomycin 1,000 mg (04/20/21 0530)   PRN Meds:.acetaminophen **OR** acetaminophen, albuterol   I have personally reviewed following labs and imaging studies  LABORATORY DATA: CBC: Recent Labs  Lab 04/17/21 2100 04/18/21 0429 04/19/21 0412 04/20/21 0142  WBC 16.0* 15.1* 13.6* 15.2*  NEUTROABS 14.6*  --  12.5* 13.9*  HGB 14.8 13.0 11.7* 12.7*  HCT 44.7 39.1 36.1* 38.9*  MCV 86.0 85.9 87.0 87.6  PLT 220 195 191 216    Basic Metabolic Panel: Recent Labs  Lab 04/17/21 2100 04/18/21 0011 04/18/21 0429 04/19/21 0412 04/20/21 0142  NA 136  --  136 136 138  K 3.1*  --  3.2* 3.0* 3.5  CL 92*  --  95* 98 100  CO2 31  --  29 30 31   GLUCOSE 98  --  81 118* 151*  BUN 49*  --  44* 41* 37*  CREATININE 1.70*  --  1.54* 1.50* 1.33*  CALCIUM 9.0  --  7.4* 8.0* 8.3*  MG  --  2.1  --  2.0 2.0  PHOS  --   --   --  1.7* 1.3*    GFR: Estimated Creatinine Clearance: 29.5 mL/min (A) (by C-G formula based on SCr of 1.33 mg/dL (H)).  Liver Function Tests: Recent Labs  Lab 04/17/21 2100 04/19/21 0412 04/20/21 0142  AST 24 21 27   ALT 14 11 12   ALKPHOS 114 87 96  BILITOT 2.0* 1.0 1.1  PROT 6.8 5.1* 5.5*  ALBUMIN 2.5* 1.9* 2.0*   No results for input(s): LIPASE, AMYLASE  in the last 168 hours. No results for input(s): AMMONIA in the last 168 hours.  Coagulation Profile: Recent Labs  Lab 04/17/21 2100  INR 1.0    Cardiac Enzymes: No results for input(s): CKTOTAL, CKMB, CKMBINDEX, TROPONINI in the last 168 hours.  BNP (last 3 results) No results for input(s): PROBNP in the last 8760 hours.  Lipid Profile: No results for input(s): CHOL, HDL, LDLCALC, TRIG, CHOLHDL, LDLDIRECT in the last 72 hours.  Thyroid Function Tests: No results for input(s): TSH, T4TOTAL, FREET4, T3FREE, THYROIDAB in the last 72 hours.  Anemia Panel: No results for input(s): VITAMINB12, FOLATE, FERRITIN, TIBC, IRON, RETICCTPCT in the last  72 hours.  Urine analysis:    Component Value Date/Time   COLORURINE YELLOW 04/17/2021 2058   APPEARANCEUR CLEAR 04/17/2021 2058   LABSPEC 1.012 04/17/2021 2058   PHURINE 5.0 04/17/2021 2058   GLUCOSEU 50 (A) 04/17/2021 2058   HGBUR NEGATIVE 04/17/2021 2058   BILIRUBINUR NEGATIVE 04/17/2021 2058   KETONESUR NEGATIVE 04/17/2021 2058   PROTEINUR NEGATIVE 04/17/2021 2058   NITRITE NEGATIVE 04/17/2021 2058   LEUKOCYTESUR NEGATIVE 04/17/2021 2058    Sepsis Labs: Lactic Acid, Venous    Component Value Date/Time   LATICACIDVEN 1.4 04/18/2021 0230    MICROBIOLOGY: Recent Results (from the past 240 hour(s))  Urine Culture     Status: Abnormal   Collection Time: 04/17/21  8:30 PM   Specimen: In/Out Cath Urine  Result Value Ref Range Status   Specimen Description IN/OUT CATH URINE  Final   Special Requests   Final    NONE Performed at [facility], [address] [place], [place] [address]    Culture (A)  Final    20,000 COLONIES/mL ESCHERICHIA COLI 10,000 COLONIES/mL PROTEUS MIRABILIS    Report Status 04/20/2021 FINAL  Final   Organism ID, Bacteria ESCHERICHIA COLI (A)  Final   Organism ID, Bacteria PROTEUS MIRABILIS (A)  Final      Susceptibility   Escherichia coli - MIC*    AMPICILLIN <=2 SENSITIVE Sensitive      CEFAZOLIN <=4 SENSITIVE Sensitive     CEFEPIME <=0.12 SENSITIVE Sensitive     CEFTRIAXONE <=0.25 SENSITIVE Sensitive     CIPROFLOXACIN <=0.25 SENSITIVE Sensitive     GENTAMICIN <=1 SENSITIVE Sensitive     IMIPENEM <=0.25 SENSITIVE Sensitive     NITROFURANTOIN <=16 SENSITIVE Sensitive     TRIMETH/SULFA <=20 SENSITIVE Sensitive     AMPICILLIN/SULBACTAM <=2 SENSITIVE Sensitive     PIP/TAZO <=4 SENSITIVE Sensitive     * 20,000 COLONIES/mL ESCHERICHIA COLI   Proteus mirabilis - MIC*    AMPICILLIN <=2 SENSITIVE Sensitive     CEFAZOLIN <=4 SENSITIVE Sensitive     CEFEPIME <=0.12 SENSITIVE Sensitive     CEFTRIAXONE <=0.25 SENSITIVE Sensitive     CIPROFLOXACIN <=0.25 SENSITIVE Sensitive     GENTAMICIN <=1 SENSITIVE Sensitive     IMIPENEM 2 SENSITIVE Sensitive     NITROFURANTOIN 128 RESISTANT Resistant     TRIMETH/SULFA <=20 SENSITIVE Sensitive     AMPICILLIN/SULBACTAM <=2 SENSITIVE Sensitive     PIP/TAZO <=4 SENSITIVE Sensitive     * 10,000 COLONIES/mL PROTEUS MIRABILIS  Resp Panel by RT-PCR (Flu A&B, Covid) Nasopharyngeal Swab     Status: None   Collection Time: 04/17/21  8:31 PM   Specimen: Nasopharyngeal Swab; Nasopharyngeal(NP) swabs in vial transport medium  Result Value Ref Range Status   SARS Coronavirus 2 by RT PCR NEGATIVE NEGATIVE Final    Comment: (NOTE) SARS-CoV-2 target nucleic acids are NOT DETECTED.  The SARS-CoV-2 RNA is generally detectable in upper respiratory specimens during the acute phase of infection. The lowest concentration of SARS-CoV-2 viral copies this assay can detect is 138 copies/mL. A negative result does not preclude SARS-Cov-2 infection and should not be used as the sole basis for treatment or other patient management decisions. A negative result may occur with  improper specimen collection/handling, submission of specimen other than nasopharyngeal swab, presence of viral mutation(s) within the areas targeted by this assay, and inadequate number of  viral copies(<138 copies/mL). A negative result must be combined with clinical observations, patient history, and epidemiological information. The expected  result is Negative.  Fact Sheet for Patients:  BloggerCourse.com  Fact Sheet for Healthcare Providers:  SeriousBroker.it  This test is no t yet approved or cleared by the [place] FDA and  has been authorized for detection and/or diagnosis of SARS-CoV-2 by FDA under an Emergency Use Authorization (EUA). This EUA will remain  in effect (meaning this test can be used) for the duration of the COVID-19 declaration under Section 564(b)(1) of the Act, 21 U.S.C.section 360bbb-3(b)(1), unless the authorization is terminated  or revoked sooner.       Influenza A by PCR NEGATIVE NEGATIVE Final   Influenza B by PCR NEGATIVE NEGATIVE Final    Comment: (NOTE) The Xpert Xpress SARS-CoV-2/FLU/RSV plus assay is intended as an aid in the diagnosis of influenza from Nasopharyngeal swab specimens and should not be used as a sole basis for treatment. Nasal washings and aspirates are unacceptable for Xpert Xpress SARS-CoV-2/FLU/RSV testing.  Fact Sheet for Patients: BloggerCourse.com  Fact Sheet for Healthcare Providers: SeriousBroker.it  This test is not yet approved or cleared by the [place] FDA and has been authorized for detection and/or diagnosis of SARS-CoV-2 by FDA under an Emergency Use Authorization (EUA). This EUA will remain in effect (meaning this test can be used) for the duration of the COVID-19 declaration under Section 564(b)(1) of the Act, 21 U.S.C. section 360bbb-3(b)(1), unless the authorization is terminated or revoked.  Performed at [facility], [address], [place], [place] [address]   Blood culture (routine x 2)     Status: None (Preliminary result)   Collection Time: 04/17/21  9:00 PM    Specimen: BLOOD RIGHT ARM  Result Value Ref Range Status   Specimen Description BLOOD RIGHT ARM  Final   Special Requests   Final    BOTTLES DRAWN AEROBIC AND ANAEROBIC Blood Culture adequate volume   Culture   Final    NO GROWTH 3 DAYS Performed at [facility], [address], [place], [place] [address]    Report Status PENDING  Incomplete  Blood culture (routine x 2)     Status: None (Preliminary result)   Collection Time: 04/17/21  9:00 PM   Specimen: BLOOD  Result Value Ref Range Status   Specimen Description BLOOD RIGHT ANTECUBITAL  Final   Special Requests AEROBIC BOTTLE ONLY Blood Culture adequate volume  Final   Culture   Final    NO GROWTH 3 DAYS Performed at [facility], [address] [place], [place] [address]    Report Status PENDING  Incomplete  MRSA Next Gen by PCR, Nasal     Status: None   Collection Time: 04/19/21  1:09 PM   Specimen: Nasal Mucosa; Nasal Swab  Result Value Ref Range Status   MRSA by PCR Next Gen NOT DETECTED NOT DETECTED Final    Comment: (NOTE) The GeneXpert MRSA Assay (FDA approved for NASAL specimens only), is one component of a comprehensive MRSA colonization surveillance program. It is not intended to diagnose MRSA infection nor to guide or monitor treatment for MRSA infections. Test performance is not FDA approved in patients less than 48 years old. Performed at [facility], [address] [place], [place] [address]     RADIOLOGY STUDIES/RESULTS: DG CHEST PORT 1 VIEW  Result Date: 04/20/2021 CLINICAL DATA:  Shortness of breath. EXAM: PORTABLE CHEST 1 VIEW COMPARISON:  Chest radiograph 04/19/2021 FINDINGS: Upper lobe predominant emphysematous changes. Again noted are interstitial airspace opacities in the right  mid and lower lungs with improved aeration in the right lung base compared to yesterday's exam. Patchy airspace opacities noted at the right hilum and medial left lower lobe. Small right pleural effusion has  also improved with trace residual fluid. No left pleural effusion. No pneumothorax. Stable mildly enlarged cardiac silhouette. Aortic calcifications. No acute osseous abnormality. IMPRESSION: Bilateral patchy airspace opacities and interstitial opacities throughout the right mid and lower lungs, concerning for multifocal bronchopneumonia. Trace right pleural effusion, improved compared to yesterday's exam. Aortic Atherosclerosis (ICD10-I70.0) and Emphysema (ICD10-J43.9). Electronically Signed   By: Sherron Ales M.D.   On: 04/20/2021 08:52   DG CHEST PORT 1 VIEW  Result Date: 04/19/2021 CLINICAL DATA:  75 year old male with history of shortness of breath. EXAM: PORTABLE CHEST 1 VIEW COMPARISON:  Chest x-ray 04/17/2021. FINDINGS: Severe emphysematous changes are again noted. Diffuse peribronchial cuffing. Patchy multifocal interstitial and airspace disease is noted in the lungs bilaterally, most severe throughout the right mid to lower lung. Small bilateral pleural effusions (right greater than left). No pneumothorax. No evidence of pulmonary edema. Heart size is mildly enlarged. Upper mediastinal contours are within normal limits. Atherosclerotic calcifications in the thoracic aorta. IMPRESSION: 1. The appearance of the chest is concerning for multilobar bronchopneumonia, most severe throughout the right mid to lower lung. 2. Small bilateral pleural effusions (right greater than left). 3. Mild cardiomegaly. 4. Aortic atherosclerosis. Electronically Signed   By: Trudie Reed M.D.   On: 04/19/2021 06:40     LOS: 3 days   Jeoffrey Massed, MD  [facility]    To contact the attending provider between 7A-7P or the covering provider during after hours 7P-7A, please log into the web site [email or web address] and access using universal [place] password for that web site. If you do not have the password, please call the hospital operator.  04/20/2021, 3:51 PM

## 2021-04-21 DIAGNOSIS — J449 Chronic obstructive pulmonary disease, unspecified: Secondary | ICD-10-CM | POA: Diagnosis not present

## 2021-04-21 DIAGNOSIS — I5020 Unspecified systolic (congestive) heart failure: Secondary | ICD-10-CM | POA: Diagnosis not present

## 2021-04-21 DIAGNOSIS — N1831 Chronic kidney disease, stage 3a: Secondary | ICD-10-CM | POA: Diagnosis not present

## 2021-04-21 DIAGNOSIS — J189 Pneumonia, unspecified organism: Secondary | ICD-10-CM | POA: Diagnosis not present

## 2021-04-21 LAB — CBC
HCT: 35.2 % — ABNORMAL LOW (ref 39.0–52.0)
Hemoglobin: 11.5 g/dL — ABNORMAL LOW (ref 13.0–17.0)
MCH: 28.2 pg (ref 26.0–34.0)
MCHC: 32.7 g/dL (ref 30.0–36.0)
MCV: 86.3 fL (ref 80.0–100.0)
Platelets: 218 10*3/uL (ref 150–400)
RBC: 4.08 MIL/uL — ABNORMAL LOW (ref 4.22–5.81)
RDW: 15.3 % (ref 11.5–15.5)
WBC: 16.1 10*3/uL — ABNORMAL HIGH (ref 4.0–10.5)
nRBC: 0 % (ref 0.0–0.2)

## 2021-04-21 LAB — BASIC METABOLIC PANEL
Anion gap: 8 (ref 5–15)
BUN: 35 mg/dL — ABNORMAL HIGH (ref 8–23)
CO2: 31 mmol/L (ref 22–32)
Calcium: 8.1 mg/dL — ABNORMAL LOW (ref 8.9–10.3)
Chloride: 96 mmol/L — ABNORMAL LOW (ref 98–111)
Creatinine, Ser: 1.09 mg/dL (ref 0.61–1.24)
GFR, Estimated: >60 mL/min (ref >60)
Glucose, Bld: 98 mg/dL (ref 70–99)
Potassium: 3.9 mmol/L (ref 3.5–5.1)
Sodium: 135 mmol/L (ref 135–145)

## 2021-04-21 LAB — PHOSPHORUS: Phosphorus: 2.2 mg/dL — ABNORMAL LOW (ref 2.5–4.6)

## 2021-04-21 MED ORDER — PANTOPRAZOLE SODIUM 40 MG PO TBEC
40.0000 mg | DELAYED_RELEASE_TABLET | Freq: Every day | ORAL | Status: DC
  Administered 2021-04-23 – 2021-04-24 (×2): 40 mg via ORAL
  Filled 2021-04-21 (×3): qty 1

## 2021-04-21 MED ORDER — POTASSIUM PHOSPHATES 15 MMOLE/5ML IV SOLN
30.0000 mmol | Freq: Once | INTRAVENOUS | Status: AC
  Administered 2021-04-21: 30 mmol via INTRAVENOUS
  Filled 2021-04-21: qty 10

## 2021-04-21 MED ORDER — ALUM & MAG HYDROXIDE-SIMETH 200-200-20 MG/5ML PO SUSP
30.0000 mL | Freq: Four times a day (QID) | ORAL | Status: DC | PRN
  Administered 2021-04-21 – 2021-04-25 (×2): 30 mL via ORAL
  Filled 2021-04-21 (×2): qty 30

## 2021-04-21 MED ORDER — MELATONIN 3 MG PO TABS
3.0000 mg | ORAL_TABLET | Freq: Every day | ORAL | Status: DC
  Administered 2021-04-21 – 2021-04-25 (×5): 3 mg via ORAL
  Filled 2021-04-21 (×5): qty 1

## 2021-04-21 NOTE — TOC Progression Note (Addendum)
Transition of Care [facility]) - Progression Note    Patient Details  Name: Ray Smith MRN: [id number] Date of Birth: 16-Feb-1946  [facility] CM/SW Contact  Lockie Pares, RN Phone Number: 04/21/2021, 3:01 PM  Clinical Narrative:     Patient refusing SNF, he wants to go back to his home at the motel. Home Health is refused too, however they will not provide services in a motel setting. Will obtain walker for patient as recommended. By PT 1434 qualifiers for oxygen done, called [employer] to deliver  Expected Discharge Plan: Home/Self Care Barriers to Discharge: Continued Medical Work up  Expected Discharge Plan and Services Expected Discharge Plan: Home/Self Care In-house Referral: Clinical Social Work   Post Acute Care Choice: Durable Medical Equipment Living arrangements for the past 2 months: Hotel/Motel                                       Social Determinants of Health (SDOH) Interventions    Readmission Risk Interventions Readmission Risk Prevention Plan 04/20/2021 11/17/2020  Transportation Screening Complete Complete  PCP or Specialist Appt within 3-5 Days - Complete  HRI or Home Care Consult - Complete  Palliative Care Screening - Not Applicable  Medication Review (RN Care Manager) Complete -  PCP or Specialist appointment within 3-5 days of discharge Complete -  HRI or Home Care Consult Complete -  SW Recovery Care/Counseling Consult Complete -  Palliative Care Screening Not Applicable -  Skilled Nursing Facility Patient Refused -  Some recent data might be hidden

## 2021-04-21 NOTE — Plan of Care (Signed)
  Problem: Clinical Measurements: Goal: Respiratory complications will improve Outcome: Progressing   Problem: Activity: Goal: Risk for activity intolerance will decrease Outcome: Progressing   

## 2021-04-21 NOTE — Progress Notes (Signed)
PROGRESS NOTE        PATIENT DETAILS Name: Ray Smith Age: 75 y.o. Sex: male Date of Birth: 12/07/1945 Admit Date: 04/17/2021 Admitting Physician Carlton Adam, MD [id number], Donalee Citrin, NP  Brief Narrative: Patient is a 75 y.o. male with history of HFrEF, COPD-on home O2, CAD, HTN, HLD-who presented with shortness of breath-patient was found to have acute on chronic hypoxic respiratory failure due to community-acquired pneumonia.  See below for further details.  Subjective: Does not want to go to rehab-looks very frail-but slowly improving.  Objective: Vitals: Blood pressure 95/66, pulse 82, temperature (!) 97.1 F (36.2 C), temperature source Oral, resp. rate 19, weight 49 kg, SpO2 100 %.   Exam: Gen Exam:Alert awake-not in any distress.  Frail-chronically sick appearing. HEENT:atraumatic, normocephalic Chest: Some scattered rhonchi-but moving air well. CVS:S1S2 regular Abdomen:soft non tender, non distended Extremities:no edema Neurology: Non focal Skin: no rash   Pertinent Labs/Radiology: WBC: 16.1 Phosphorus:2.2  10/23>>Blood culture: No growth 10/23>>Urine Culture: E. coli/Proteus (both<100,000 colonies)  10/26>>CXR: Bilateral patchy airspace opacities. 05/11>> Echo: EF 25-30%.   Assessment/Plan: Acute on chronic hypoxic respiratory failure due to community-acquired pneumonia: Slowly clinically improving-continues to have persistent leukocytosis-we will obtain CT chest to delineate infiltrates better-and to rule out other pathologies.  Continue Rocephin/doxycycline and follow clinical course.    AKI on CKD stage IIIa: AKI hemodynamically mediated-improving with supportive care.  Asymptomatic bacteriuria: Not felt to have UTI-he has no symptoms-urine cultures are of no clinical relevance.  COPD: Not in exacerbation-continue bronchodilators.  HFrEF: Not in exacerbation-volume status stable-continue Lasix  CAD: No anginal  symptoms-continue Plavix/Coreg/statin  HTN: BP stable-continue Coreg/Lasix  Hypophosphatemia: Continue to replete and recheck.  Debility/deconditioning: Appears weak-agree with PT-likely will benefit from SNF-however patient refusing-and wants to go back to his prior living situation (lives in a long-term motel).  We will try to see if home health services can be arranged.  Homelessness: Lives in a long-term motel   Procedures: None Consults: None DVT Prophylaxis: Lovenox Code Status:Full code  Family Communication: None at bedside  Time spent: 25- minutes-Greater than 50% of this time was spent in counseling, explanation of diagnosis, planning of further management, and coordination of care.  Diet: Diet Order             Diet Heart Room service appropriate? Yes; Fluid consistency: Thin  Diet effective now                      Disposition Plan: Status is: Inpatient  Remains inpatient appropriate because: Requires inpatient level of care given acuity of illness.    Barriers to Discharge: Hypoxia from PNA-on oxygen and IV antibiotics.  Not yet stable for discharge-likely will require SNF.  Antimicrobial agents: Anti-infectives (From admission, onward)    Start     Dose/Rate Route Frequency Ordered Stop   04/21/21 0000  cefTRIAXone (ROCEPHIN) 2 g in sodium chloride 0.9 % 100 mL IVPB        2 g 200 mL/hr over 30 Minutes Intravenous Every 24 hours 04/20/21 1600     04/20/21 2200  doxycycline (VIBRA-TABS) tablet 100 mg        100 mg Oral Every 12 hours 04/20/21 1600 04/23/21 2159   04/20/21 0600  vancomycin (VANCOREADY) IVPB 750 mg/150 mL  Status:  Discontinued  750 mg 150 mL/hr over 60 Minutes Intravenous Every 48 hours 04/18/21 0022 04/18/21 0754   04/20/21 0600  vancomycin (VANCOCIN) IVPB 1000 mg/200 mL premix  Status:  Discontinued        1,000 mg 200 mL/hr over 60 Minutes Intravenous Every 48 hours 04/18/21 0754 04/20/21 1600   04/18/21 2200  ceFEPIme  (MAXIPIME) 2 g in sodium chloride 0.9 % 100 mL IVPB  Status:  Discontinued        2 g 200 mL/hr over 30 Minutes Intravenous Every 24 hours 04/18/21 0022 04/20/21 1600   04/18/21 1800  ceFEPIme (MAXIPIME) 2 g in sodium chloride 0.9 % 100 mL IVPB  Status:  Discontinued        2 g 200 mL/hr over 30 Minutes Intravenous Every 24 hours 04/17/21 2355 04/18/21 0022   04/17/21 2315  vancomycin (VANCOCIN) IVPB 1000 mg/200 mL premix        1,000 mg 200 mL/hr over 60 Minutes Intravenous  Once 04/17/21 2301 04/18/21 0118   04/17/21 2315  ceFEPIme (MAXIPIME) 2 g in sodium chloride 0.9 % 100 mL IVPB        2 g 200 mL/hr over 30 Minutes Intravenous  Once 04/17/21 2301 04/17/21 2358        MEDICATIONS: Scheduled Meds:  arformoterol  15 mcg Nebulization BID   atorvastatin  80 mg Oral q1800   budesonide (PULMICORT) nebulizer solution  0.25 mg Nebulization BID   carvedilol  6.25 mg Oral BID WC   clopidogrel  75 mg Oral Daily   dapagliflozin propanediol  10 mg Oral Daily   doxycycline  100 mg Oral Q12H   enoxaparin (LOVENOX) injection  30 mg Subcutaneous Q24H   furosemide  40 mg Oral Daily   guaiFENesin  1,200 mg Oral BID   mirtazapine  7.5 mg Oral QHS   umeclidinium bromide  1 puff Inhalation Daily   Continuous Infusions:  cefTRIAXone (ROCEPHIN)  IV Stopped (04/21/21 0134)   potassium PHOSPHATE IVPB (in mmol)     PRN Meds:.acetaminophen **OR** acetaminophen, albuterol   I have personally reviewed following labs and imaging studies  LABORATORY DATA: CBC: Recent Labs  Lab 04/17/21 2100 04/18/21 0429 04/19/21 0412 04/20/21 0142 04/21/21 0113  WBC 16.0* 15.1* 13.6* 15.2* 16.1*  NEUTROABS 14.6*  --  12.5* 13.9*  --   HGB 14.8 13.0 11.7* 12.7* 11.5*  HCT 44.7 39.1 36.1* 38.9* 35.2*  MCV 86.0 85.9 87.0 87.6 86.3  PLT 220 195 191 216 218     Basic Metabolic Panel: Recent Labs  Lab 04/17/21 2100 04/18/21 0011 04/18/21 0429 04/19/21 0412 04/20/21 0142 04/21/21 0113  NA 136  --   136 136 138 135  K 3.1*  --  3.2* 3.0* 3.5 3.9  CL 92*  --  95* 98 100 96*  CO2 31  --  29 30 31 31   GLUCOSE 98  --  81 118* 151* 98  BUN 49*  --  44* 41* 37* 35*  CREATININE 1.70*  --  1.54* 1.50* 1.33* 1.09  CALCIUM 9.0  --  7.4* 8.0* 8.3* 8.1*  MG  --  2.1  --  2.0 2.0  --   PHOS  --   --   --  1.7* 1.3* 2.2*     GFR: Estimated Creatinine Clearance: 40 mL/min (by C-G formula based on SCr of 1.09 mg/dL).  Liver Function Tests: Recent Labs  Lab 04/17/21 2100 04/19/21 0412 04/20/21 0142  AST 24 21 27   ALT 14  11 12  ALKPHOS 114 87 96  BILITOT 2.0* 1.0 1.1  PROT 6.8 5.1* 5.5*  ALBUMIN 2.5* 1.9* 2.0*    No results for input(s): LIPASE, AMYLASE in the last 168 hours. No results for input(s): AMMONIA in the last 168 hours.  Coagulation Profile: Recent Labs  Lab 04/17/21 2100  INR 1.0     Cardiac Enzymes: No results for input(s): CKTOTAL, CKMB, CKMBINDEX, TROPONINI in the last 168 hours.  BNP (last 3 results) No results for input(s): PROBNP in the last 8760 hours.  Lipid Profile: No results for input(s): CHOL, HDL, LDLCALC, TRIG, CHOLHDL, LDLDIRECT in the last 72 hours.  Thyroid Function Tests: No results for input(s): TSH, T4TOTAL, FREET4, T3FREE, THYROIDAB in the last 72 hours.  Anemia Panel: No results for input(s): VITAMINB12, FOLATE, FERRITIN, TIBC, IRON, RETICCTPCT in the last 72 hours.  Urine analysis:    Component Value Date/Time   COLORURINE YELLOW 04/17/2021 2058   APPEARANCEUR CLEAR 04/17/2021 2058   LABSPEC 1.012 04/17/2021 2058   PHURINE 5.0 04/17/2021 2058   GLUCOSEU 50 (A) 04/17/2021 2058   HGBUR NEGATIVE 04/17/2021 2058   BILIRUBINUR NEGATIVE 04/17/2021 2058   KETONESUR NEGATIVE 04/17/2021 2058   PROTEINUR NEGATIVE 04/17/2021 2058   NITRITE NEGATIVE 04/17/2021 2058   LEUKOCYTESUR NEGATIVE 04/17/2021 2058    Sepsis Labs: Lactic Acid, Venous    Component Value Date/Time   LATICACIDVEN 1.4 04/18/2021 0230    MICROBIOLOGY: Recent  Results (from the past 240 hour(s))  Urine Culture     Status: Abnormal   Collection Time: 04/17/21  8:30 PM   Specimen: In/Out Cath Urine  Result Value Ref Range Status   Specimen Description IN/OUT CATH URINE  Final   Special Requests   Final    NONE Performed at [facility], [address] [place], [place] [address]    Culture (A)  Final    20,000 COLONIES/mL ESCHERICHIA COLI 10,000 COLONIES/mL PROTEUS MIRABILIS    Report Status 04/20/2021 FINAL  Final   Organism ID, Bacteria ESCHERICHIA COLI (A)  Final   Organism ID, Bacteria PROTEUS MIRABILIS (A)  Final      Susceptibility   Escherichia coli - MIC*    AMPICILLIN <=2 SENSITIVE Sensitive     CEFAZOLIN <=4 SENSITIVE Sensitive     CEFEPIME <=0.12 SENSITIVE Sensitive     CEFTRIAXONE <=0.25 SENSITIVE Sensitive     CIPROFLOXACIN <=0.25 SENSITIVE Sensitive     GENTAMICIN <=1 SENSITIVE Sensitive     IMIPENEM <=0.25 SENSITIVE Sensitive     NITROFURANTOIN <=16 SENSITIVE Sensitive     TRIMETH/SULFA <=20 SENSITIVE Sensitive     AMPICILLIN/SULBACTAM <=2 SENSITIVE Sensitive     PIP/TAZO <=4 SENSITIVE Sensitive     * 20,000 COLONIES/mL ESCHERICHIA COLI   Proteus mirabilis - MIC*    AMPICILLIN <=2 SENSITIVE Sensitive     CEFAZOLIN <=4 SENSITIVE Sensitive     CEFEPIME <=0.12 SENSITIVE Sensitive     CEFTRIAXONE <=0.25 SENSITIVE Sensitive     CIPROFLOXACIN <=0.25 SENSITIVE Sensitive     GENTAMICIN <=1 SENSITIVE Sensitive     IMIPENEM 2 SENSITIVE Sensitive     NITROFURANTOIN 128 RESISTANT Resistant     TRIMETH/SULFA <=20 SENSITIVE Sensitive     AMPICILLIN/SULBACTAM <=2 SENSITIVE Sensitive     PIP/TAZO <=4 SENSITIVE Sensitive     * 10,000 COLONIES/mL PROTEUS MIRABILIS  Resp Panel by RT-PCR (Flu A&B, Covid) Nasopharyngeal Swab     Status: None   Collection Time: 04/17/21  8:31 PM   Specimen:  Nasopharyngeal Swab; Nasopharyngeal(NP) swabs in vial transport medium  Result Value Ref Range Status   SARS Coronavirus 2 by RT PCR  NEGATIVE NEGATIVE Final    Comment: (NOTE) SARS-CoV-2 target nucleic acids are NOT DETECTED.  The SARS-CoV-2 RNA is generally detectable in upper respiratory specimens during the acute phase of infection. The lowest concentration of SARS-CoV-2 viral copies this assay can detect is 138 copies/mL. A negative result does not preclude SARS-Cov-2 infection and should not be used as the sole basis for treatment or other patient management decisions. A negative result may occur with  improper specimen collection/handling, submission of specimen other than nasopharyngeal swab, presence of viral mutation(s) within the areas targeted by this assay, and inadequate number of viral copies(<138 copies/mL). A negative result must be combined with clinical observations, patient history, and epidemiological information. The expected result is Negative.  Fact Sheet for Patients:  BloggerCourse.com  Fact Sheet for Healthcare Providers:  SeriousBroker.it  This test is no t yet approved or cleared by the [place] FDA and  has been authorized for detection and/or diagnosis of SARS-CoV-2 by FDA under an Emergency Use Authorization (EUA). This EUA will remain  in effect (meaning this test can be used) for the duration of the COVID-19 declaration under Section 564(b)(1) of the Act, 21 U.S.C.section 360bbb-3(b)(1), unless the authorization is terminated  or revoked sooner.       Influenza A by PCR NEGATIVE NEGATIVE Final   Influenza B by PCR NEGATIVE NEGATIVE Final    Comment: (NOTE) The Xpert Xpress SARS-CoV-2/FLU/RSV plus assay is intended as an aid in the diagnosis of influenza from Nasopharyngeal swab specimens and should not be used as a sole basis for treatment. Nasal washings and aspirates are unacceptable for Xpert Xpress SARS-CoV-2/FLU/RSV testing.  Fact Sheet for Patients: BloggerCourse.com  Fact Sheet for  Healthcare Providers: SeriousBroker.it  This test is not yet approved or cleared by the [place] FDA and has been authorized for detection and/or diagnosis of SARS-CoV-2 by FDA under an Emergency Use Authorization (EUA). This EUA will remain in effect (meaning this test can be used) for the duration of the COVID-19 declaration under Section 564(b)(1) of the Act, 21 U.S.C. section 360bbb-3(b)(1), unless the authorization is terminated or revoked.  Performed at [facility], [address] [place], [place] [address]   Blood culture (routine x 2)     Status: None (Preliminary result)   Collection Time: 04/17/21  9:00 PM   Specimen: BLOOD RIGHT ARM  Result Value Ref Range Status   Specimen Description BLOOD RIGHT ARM  Final   Special Requests   Final    BOTTLES DRAWN AEROBIC AND ANAEROBIC Blood Culture adequate volume   Culture   Final    NO GROWTH 4 DAYS Performed at [facility], [address] [place], [place] [address]    Report Status PENDING  Incomplete  Blood culture (routine x 2)     Status: None (Preliminary result)   Collection Time: 04/17/21  9:00 PM   Specimen: BLOOD  Result Value Ref Range Status   Specimen Description BLOOD RIGHT ANTECUBITAL  Final   Special Requests AEROBIC BOTTLE ONLY Blood Culture adequate volume  Final   Culture   Final    NO GROWTH 4 DAYS Performed at [facility], [address] [place], [place] [address]    Report Status PENDING  Incomplete  MRSA Next Gen by PCR, Nasal     Status: None   Collection  Time: 04/19/21  1:09 PM   Specimen: Nasal Mucosa; Nasal Swab  Result Value Ref Range Status   MRSA by PCR Next Gen NOT DETECTED NOT DETECTED Final    Comment: (NOTE) The GeneXpert MRSA Assay (FDA approved for NASAL specimens only), is one component of a comprehensive MRSA colonization surveillance program. It is not intended to diagnose MRSA infection nor to guide or monitor treatment for MRSA  infections. Test performance is not FDA approved in patients less than 32 years old. Performed at [facility], [address], [place], [place] [address]     RADIOLOGY STUDIES/RESULTS: DG CHEST PORT 1 VIEW  Result Date: 04/20/2021 CLINICAL DATA:  Shortness of breath. EXAM: PORTABLE CHEST 1 VIEW COMPARISON:  Chest radiograph 04/19/2021 FINDINGS: Upper lobe predominant emphysematous changes. Again noted are interstitial airspace opacities in the right mid and lower lungs with improved aeration in the right lung base compared to yesterday's exam. Patchy airspace opacities noted at the right hilum and medial left lower lobe. Small right pleural effusion has also improved with trace residual fluid. No left pleural effusion. No pneumothorax. Stable mildly enlarged cardiac silhouette. Aortic calcifications. No acute osseous abnormality. IMPRESSION: Bilateral patchy airspace opacities and interstitial opacities throughout the right mid and lower lungs, concerning for multifocal bronchopneumonia. Trace right pleural effusion, improved compared to yesterday's exam. Aortic Atherosclerosis (ICD10-I70.0) and Emphysema (ICD10-J43.9). Electronically Signed   By: Sherron Ales M.D.   On: 04/20/2021 08:52     LOS: 4 days   Jeoffrey Massed, MD  [facility]    To contact the attending provider between 7A-7P or the covering provider during after hours 7P-7A, please log into the web site [email or web address] and access using universal [place] password for that web site. If you do not have the password, please call the hospital operator.  04/21/2021, 12:28 PM

## 2021-04-21 NOTE — Progress Notes (Signed)
Patient asked to walk for oxygen qualification screening. Patient refused stating he is too tired, will notify on coming shift.

## 2021-04-22 ENCOUNTER — Inpatient Hospital Stay (HOSPITAL_COMMUNITY): Payer: 59

## 2021-04-22 DIAGNOSIS — J189 Pneumonia, unspecified organism: Secondary | ICD-10-CM | POA: Diagnosis not present

## 2021-04-22 DIAGNOSIS — J449 Chronic obstructive pulmonary disease, unspecified: Secondary | ICD-10-CM | POA: Diagnosis not present

## 2021-04-22 DIAGNOSIS — N1831 Chronic kidney disease, stage 3a: Secondary | ICD-10-CM | POA: Diagnosis not present

## 2021-04-22 DIAGNOSIS — I5020 Unspecified systolic (congestive) heart failure: Secondary | ICD-10-CM | POA: Diagnosis not present

## 2021-04-22 LAB — COMPREHENSIVE METABOLIC PANEL
ALT: 18 U/L (ref 0–44)
AST: 33 U/L (ref 15–41)
Albumin: 1.9 g/dL — ABNORMAL LOW (ref 3.5–5.0)
Alkaline Phosphatase: 91 U/L (ref 38–126)
Anion gap: 9 (ref 5–15)
BUN: 30 mg/dL — ABNORMAL HIGH (ref 8–23)
CO2: 33 mmol/L — ABNORMAL HIGH (ref 22–32)
Calcium: 7.8 mg/dL — ABNORMAL LOW (ref 8.9–10.3)
Chloride: 95 mmol/L — ABNORMAL LOW (ref 98–111)
Creatinine, Ser: 1.07 mg/dL (ref 0.61–1.24)
GFR, Estimated: >60 mL/min (ref >60)
Glucose, Bld: 89 mg/dL (ref 70–99)
Potassium: 3.7 mmol/L (ref 3.5–5.1)
Sodium: 137 mmol/L (ref 135–145)
Total Bilirubin: 0.5 mg/dL (ref 0.3–1.2)
Total Protein: 5.5 g/dL — ABNORMAL LOW (ref 6.5–8.1)

## 2021-04-22 LAB — CULTURE, BLOOD (ROUTINE X 2)
Culture: NO GROWTH
Culture: NO GROWTH
Special Requests: ADEQUATE
Special Requests: ADEQUATE

## 2021-04-22 LAB — CBC
HCT: 35.3 % — ABNORMAL LOW (ref 39.0–52.0)
Hemoglobin: 11.7 g/dL — ABNORMAL LOW (ref 13.0–17.0)
MCH: 28.7 pg (ref 26.0–34.0)
MCHC: 33.1 g/dL (ref 30.0–36.0)
MCV: 86.7 fL (ref 80.0–100.0)
Platelets: 210 10*3/uL (ref 150–400)
RBC: 4.07 MIL/uL — ABNORMAL LOW (ref 4.22–5.81)
RDW: 15.6 % — ABNORMAL HIGH (ref 11.5–15.5)
WBC: 15.6 10*3/uL — ABNORMAL HIGH (ref 4.0–10.5)
nRBC: 0 % (ref 0.0–0.2)

## 2021-04-22 LAB — MAGNESIUM: Magnesium: 1.8 mg/dL (ref 1.7–2.4)

## 2021-04-22 LAB — PHOSPHORUS: Phosphorus: 4.4 mg/dL (ref 2.5–4.6)

## 2021-04-22 MED ORDER — MAGNESIUM SULFATE 2 GM/50ML IV SOLN
2.0000 g | Freq: Once | INTRAVENOUS | Status: AC
  Administered 2021-04-22: 2 g via INTRAVENOUS
  Filled 2021-04-22: qty 50

## 2021-04-22 NOTE — Plan of Care (Signed)
  Problem: Clinical Measurements: Goal: Will remain free from infection Outcome: Progressing Goal: Respiratory complications will improve Outcome: Progressing   Problem: Pain Managment: Goal: General experience of comfort will improve Outcome: Progressing   

## 2021-04-22 NOTE — Progress Notes (Signed)
SATURATION QUALIFICATIONS: (This note is used to comply with regulatory documentation for home oxygen)  Patient Saturations on Room Air at Rest = 92%  Patient Saturations on Room Air while Ambulating = 85%  Patient Saturations on 2.5 Liters of oxygen while Ambulating = 96%  Please briefly explain why patient needs home oxygen: patient noted with increased SOB, and difficulty maintaining O2 sats on RA with any mobility.  Supplemental oxygen was needed to return his saturation levels above 92% and decrease shortness of breath, so the patient can carry out routine mobility and ADL tasks safely.  During toileting task without O2, the patient dropped to 78% on RA briefly.

## 2021-04-22 NOTE — Progress Notes (Signed)
PROGRESS NOTE        PATIENT DETAILS Name: Ray Smith Age: 75 y.o. Sex: male Date of Birth: 1945/11/25 Admit Date: 04/17/2021 Admitting Physician Carlton Adam, MD QBH:ALPFXTK, Donalee Citrin, NP  Brief Narrative: Patient is a 75 y.o. male with history of HFrEF, COPD-on home O2, CAD, HTN, HLD-who presented with shortness of breath-patient was found to have acute on chronic hypoxic respiratory failure due to community-acquired pneumonia.  See below for further details.  Subjective: No major issues overnight-continues to cough-appears weak and frail.  Objective: Vitals: Blood pressure 130/89, pulse 84, temperature 97.7 F (36.5 C), temperature source Oral, resp. rate (!) 22, weight 45 kg, SpO2 100 %.   Exam: Gen Exam:Alert awake-not in any distress HEENT:atraumatic, normocephalic Chest: B/L clear to auscultation anteriorly CVS:S1S2 regular Abdomen:soft non tender, non distended Extremities:no edema Neurology: Non focal Skin: no rash   Pertinent Labs/Radiology: WBC: 15.6 Phosphorus:4.4 Mg:1.8  10/23>>Blood culture: No growth 10/23>>Urine Culture: E. coli/Proteus (both<100,000 colonies)  10/26>>CXR: Bilateral patchy airspace opacities. 05/11>> Echo: EF 25-30%.   Assessment/Plan: Acute on chronic hypoxic respiratory failure due to community-acquired pneumonia: Improving-unclear why he has persistent leukocytosis-continue Rocephin/doxycycline-all cultures negative.  Follow clinical course  AKI on CKD stage IIIa: AKI hemodynamically mediated-improving with supportive care.  Asymptomatic bacteriuria: Not felt to have UTI-he has no symptoms-urine cultures are of no clinical relevance.  COPD with chronic hypoxic respiratory failure (on nocturnal O2): Not in exacerbation-continue bronchodilators.  HFrEF: Not in exacerbation-volume status stable-continue Lasix  CAD: No anginal symptoms-continue Plavix/Coreg/statin  HTN: BP stable-continue  Coreg/Lasix  Hypophosphatemia: Continue to replete and recheck.  Debility/deconditioning: Appears weak-agree with PT-likely will benefit from SNF-however patient refusing-and wants to go back to his prior living situation (lives in a long-term motel).  We will try to see if home health services can be arranged.  Homelessness: Lives in a long-term motel   Procedures: None Consults: None DVT Prophylaxis: Lovenox Code Status:Full code  Family Communication: None at bedside  Time spent: 25- minutes-Greater than 50% of this time was spent in counseling, explanation of diagnosis, planning of further management, and coordination of care.  Diet: Diet Order             Diet Heart Room service appropriate? Yes; Fluid consistency: Thin  Diet effective now                      Disposition Plan: Status is: Inpatient  Remains inpatient appropriate because: Requires inpatient level of care given acuity of illness.    Barriers to Discharge: Hypoxia from PNA-on oxygen and IV antibiotics.  Not yet stable for discharge-likely will require SNF.  Antimicrobial agents: Anti-infectives (From admission, onward)    Start     Dose/Rate Route Frequency Ordered Stop   04/21/21 0000  cefTRIAXone (ROCEPHIN) 2 g in sodium chloride 0.9 % 100 mL IVPB        2 g 200 mL/hr over 30 Minutes Intravenous Every 24 hours 04/20/21 1600 04/24/21 2359   04/20/21 2200  doxycycline (VIBRA-TABS) tablet 100 mg        100 mg Oral Every 12 hours 04/20/21 1600 04/23/21 2159   04/20/21 0600  vancomycin (VANCOREADY) IVPB 750 mg/150 mL  Status:  Discontinued        750 mg 150 mL/hr over 60 Minutes Intravenous Every 48 hours 04/18/21 0022 04/18/21  0630   04/20/21 0600  vancomycin (VANCOCIN) IVPB 1000 mg/200 mL premix  Status:  Discontinued        1,000 mg 200 mL/hr over 60 Minutes Intravenous Every 48 hours 04/18/21 0754 04/20/21 1600   04/18/21 2200  ceFEPIme (MAXIPIME) 2 g in sodium chloride 0.9 % 100 mL IVPB   Status:  Discontinued        2 g 200 mL/hr over 30 Minutes Intravenous Every 24 hours 04/18/21 0022 04/20/21 1600   04/18/21 1800  ceFEPIme (MAXIPIME) 2 g in sodium chloride 0.9 % 100 mL IVPB  Status:  Discontinued        2 g 200 mL/hr over 30 Minutes Intravenous Every 24 hours 04/17/21 2355 04/18/21 0022   04/17/21 2315  vancomycin (VANCOCIN) IVPB 1000 mg/200 mL premix        1,000 mg 200 mL/hr over 60 Minutes Intravenous  Once 04/17/21 2301 04/18/21 0118   04/17/21 2315  ceFEPIme (MAXIPIME) 2 g in sodium chloride 0.9 % 100 mL IVPB        2 g 200 mL/hr over 30 Minutes Intravenous  Once 04/17/21 2301 04/17/21 2358        MEDICATIONS: Scheduled Meds:  arformoterol  15 mcg Nebulization BID   atorvastatin  80 mg Oral q1800   budesonide (PULMICORT) nebulizer solution  0.25 mg Nebulization BID   carvedilol  6.25 mg Oral BID WC   clopidogrel  75 mg Oral Daily   dapagliflozin propanediol  10 mg Oral Daily   doxycycline  100 mg Oral Q12H   enoxaparin (LOVENOX) injection  30 mg Subcutaneous Q24H   furosemide  40 mg Oral Daily   guaiFENesin  1,200 mg Oral BID   melatonin  3 mg Oral QHS   mirtazapine  7.5 mg Oral QHS   pantoprazole  40 mg Oral Q1200   umeclidinium bromide  1 puff Inhalation Daily   Continuous Infusions:  cefTRIAXone (ROCEPHIN)  IV Stopped (04/21/21 2336)   PRN Meds:.acetaminophen **OR** acetaminophen, albuterol, alum & mag hydroxide-simeth   I have personally reviewed following labs and imaging studies  LABORATORY DATA: CBC: Recent Labs  Lab 04/17/21 2100 04/18/21 0429 04/19/21 0412 04/20/21 0142 04/21/21 0113 04/22/21 0116  WBC 16.0* 15.1* 13.6* 15.2* 16.1* 15.6*  NEUTROABS 14.6*  --  12.5* 13.9*  --   --   HGB 14.8 13.0 11.7* 12.7* 11.5* 11.7*  HCT 44.7 39.1 36.1* 38.9* 35.2* 35.3*  MCV 86.0 85.9 87.0 87.6 86.3 86.7  PLT 220 195 191 216 218 210     Basic Metabolic Panel: Recent Labs  Lab 04/18/21 0011 04/18/21 0429 04/19/21 0412 04/20/21 0142  04/21/21 0113 04/22/21 0116  NA  --  136 136 138 135 137  K  --  3.2* 3.0* 3.5 3.9 3.7  CL  --  95* 98 100 96* 95*  CO2  --  29 30 31 31  33*  GLUCOSE  --  81 118* 151* 98 89  BUN  --  44* 41* 37* 35* 30*  CREATININE  --  1.54* 1.50* 1.33* 1.09 1.07  CALCIUM  --  7.4* 8.0* 8.3* 8.1* 7.8*  MG 2.1  --  2.0 2.0  --  1.8  PHOS  --   --  1.7* 1.3* 2.2* 4.4     GFR: Estimated Creatinine Clearance: 37.4 mL/min (by C-G formula based on SCr of 1.07 mg/dL).  Liver Function Tests: Recent Labs  Lab 04/17/21 2100 04/19/21 0412 04/20/21 0142 04/22/21 0116  AST 24  21 27 33  ALT 14 11 12 18   ALKPHOS 114 87 96 91  BILITOT 2.0* 1.0 1.1 0.5  PROT 6.8 5.1* 5.5* 5.5*  ALBUMIN 2.5* 1.9* 2.0* 1.9*    No results for input(s): LIPASE, AMYLASE in the last 168 hours. No results for input(s): AMMONIA in the last 168 hours.  Coagulation Profile: Recent Labs  Lab 04/17/21 2100  INR 1.0     Cardiac Enzymes: No results for input(s): CKTOTAL, CKMB, CKMBINDEX, TROPONINI in the last 168 hours.  BNP (last 3 results) No results for input(s): PROBNP in the last 8760 hours.  Lipid Profile: No results for input(s): CHOL, HDL, LDLCALC, TRIG, CHOLHDL, LDLDIRECT in the last 72 hours.  Thyroid Function Tests: No results for input(s): TSH, T4TOTAL, FREET4, T3FREE, THYROIDAB in the last 72 hours.  Anemia Panel: No results for input(s): VITAMINB12, FOLATE, FERRITIN, TIBC, IRON, RETICCTPCT in the last 72 hours.  Urine analysis:    Component Value Date/Time   COLORURINE YELLOW 04/17/2021 2058   APPEARANCEUR CLEAR 04/17/2021 2058   LABSPEC 1.012 04/17/2021 2058   PHURINE 5.0 04/17/2021 2058   GLUCOSEU 50 (A) 04/17/2021 2058   HGBUR NEGATIVE 04/17/2021 2058   BILIRUBINUR NEGATIVE 04/17/2021 2058   KETONESUR NEGATIVE 04/17/2021 2058   PROTEINUR NEGATIVE 04/17/2021 2058   NITRITE NEGATIVE 04/17/2021 2058   LEUKOCYTESUR NEGATIVE 04/17/2021 2058    Sepsis Labs: Lactic Acid, Venous    Component  Value Date/Time   LATICACIDVEN 1.4 04/18/2021 0230    MICROBIOLOGY: Recent Results (from the past 240 hour(s))  Urine Culture     Status: Abnormal   Collection Time: 04/17/21  8:30 PM   Specimen: In/Out Cath Urine  Result Value Ref Range Status   Specimen Description IN/OUT CATH URINE  Final   Special Requests   Final    NONE Performed at [facility], [address] [place], [place]    Culture (A)  Final    20,000 COLONIES/mL ESCHERICHIA COLI 10,000 COLONIES/mL PROTEUS MIRABILIS    Report Status 04/20/2021 FINAL  Final   Organism ID, Bacteria ESCHERICHIA COLI (A)  Final   Organism ID, Bacteria PROTEUS MIRABILIS (A)  Final      Susceptibility   Escherichia coli - MIC*    AMPICILLIN <=2 SENSITIVE Sensitive     CEFAZOLIN <=4 SENSITIVE Sensitive     CEFEPIME <=0.12 SENSITIVE Sensitive     CEFTRIAXONE <=0.25 SENSITIVE Sensitive     CIPROFLOXACIN <=0.25 SENSITIVE Sensitive     GENTAMICIN <=1 SENSITIVE Sensitive     IMIPENEM <=0.25 SENSITIVE Sensitive     NITROFURANTOIN <=16 SENSITIVE Sensitive     TRIMETH/SULFA <=20 SENSITIVE Sensitive     AMPICILLIN/SULBACTAM <=2 SENSITIVE Sensitive     PIP/TAZO <=4 SENSITIVE Sensitive     * 20,000 COLONIES/mL ESCHERICHIA COLI   Proteus mirabilis - MIC*    AMPICILLIN <=2 SENSITIVE Sensitive     CEFAZOLIN <=4 SENSITIVE Sensitive     CEFEPIME <=0.12 SENSITIVE Sensitive     CEFTRIAXONE <=0.25 SENSITIVE Sensitive     CIPROFLOXACIN <=0.25 SENSITIVE Sensitive     GENTAMICIN <=1 SENSITIVE Sensitive     IMIPENEM 2 SENSITIVE Sensitive     NITROFURANTOIN 128 RESISTANT Resistant     TRIMETH/SULFA <=20 SENSITIVE Sensitive     AMPICILLIN/SULBACTAM <=2 SENSITIVE Sensitive     PIP/TAZO <=4 SENSITIVE Sensitive     * 10,000 COLONIES/mL PROTEUS MIRABILIS  Resp Panel by RT-PCR (Flu A&B, Covid) Nasopharyngeal Swab     Status: None  Collection Time: 04/17/21  8:31 PM   Specimen: Nasopharyngeal Swab; Nasopharyngeal(NP) swabs in vial  transport medium  Result Value Ref Range Status   SARS Coronavirus 2 by RT PCR NEGATIVE NEGATIVE Final    Comment: (NOTE) SARS-CoV-2 target nucleic acids are NOT DETECTED.  The SARS-CoV-2 RNA is generally detectable in upper respiratory specimens during the acute phase of infection. The lowest concentration of SARS-CoV-2 viral copies this assay can detect is 138 copies/mL. A negative result does not preclude SARS-Cov-2 infection and should not be used as the sole basis for treatment or other patient management decisions. A negative result may occur with  improper specimen collection/handling, submission of specimen other than nasopharyngeal swab, presence of viral mutation(s) within the areas targeted by this assay, and inadequate number of viral copies(<138 copies/mL). A negative result must be combined with clinical observations, patient history, and epidemiological information. The expected result is Negative.  Fact Sheet for Patients:  BloggerCourse.com  Fact Sheet for Healthcare Providers:  SeriousBroker.it  This test is no t yet approved or cleared by the [place] FDA and  has been authorized for detection and/or diagnosis of SARS-CoV-2 by FDA under an Emergency Use Authorization (EUA). This EUA will remain  in effect (meaning this test can be used) for the duration of the COVID-19 declaration under Section 564(b)(1) of the Act, 21 U.S.C.section 360bbb-3(b)(1), unless the authorization is terminated  or revoked sooner.       Influenza A by PCR NEGATIVE NEGATIVE Final   Influenza B by PCR NEGATIVE NEGATIVE Final    Comment: (NOTE) The Xpert Xpress SARS-CoV-2/FLU/RSV plus assay is intended as an aid in the diagnosis of influenza from Nasopharyngeal swab specimens and should not be used as a sole basis for treatment. Nasal washings and aspirates are unacceptable for Xpert Xpress SARS-CoV-2/FLU/RSV testing.  Fact  Sheet for Patients: BloggerCourse.com  Fact Sheet for Healthcare Providers: SeriousBroker.it  This test is not yet approved or cleared by the [place] FDA and has been authorized for detection and/or diagnosis of SARS-CoV-2 by FDA under an Emergency Use Authorization (EUA). This EUA will remain in effect (meaning this test can be used) for the duration of the COVID-19 declaration under Section 564(b)(1) of the Act, 21 U.S.C. section 360bbb-3(b)(1), unless the authorization is terminated or revoked.  Performed at [facility], [address] [place], [place] [address]   Blood culture (routine x 2)     Status: None   Collection Time: 04/17/21  9:00 PM   Specimen: BLOOD RIGHT ARM  Result Value Ref Range Status   Specimen Description BLOOD RIGHT ARM  Final   Special Requests   Final    BOTTLES DRAWN AEROBIC AND ANAEROBIC Blood Culture adequate volume   Culture   Final    NO GROWTH 5 DAYS Performed at [facility], [address], [place], [place] [address]    Report Status 04/22/2021 FINAL  Final  Blood culture (routine x 2)     Status: None   Collection Time: 04/17/21  9:00 PM   Specimen: BLOOD  Result Value Ref Range Status   Specimen Description BLOOD RIGHT ANTECUBITAL  Final   Special Requests AEROBIC BOTTLE ONLY Blood Culture adequate volume  Final   Culture   Final    NO GROWTH 5 DAYS Performed at [facility], [address] [place], [place] [address]    Report Status 04/22/2021 FINAL  Final  MRSA Next Gen by PCR, Nasal  Status: None   Collection Time: 04/19/21  1:09 PM   Specimen: Nasal Mucosa; Nasal Swab  Result Value Ref Range Status   MRSA by PCR Next Gen NOT DETECTED NOT DETECTED Final    Comment: (NOTE) The GeneXpert MRSA Assay (FDA approved for NASAL specimens only), is one component of a comprehensive MRSA colonization surveillance program. It is not intended to diagnose MRSA  infection nor to guide or monitor treatment for MRSA infections. Test performance is not FDA approved in patients less than 18 years old. Performed at [facility], [address] [place], [place] [address]     RADIOLOGY STUDIES/RESULTS: CT CHEST WO CONTRAST  Result Date: 04/22/2021 CLINICAL DATA:  Pneumonia, effusion EXAM: CT CHEST WITHOUT CONTRAST TECHNIQUE: Multidetector CT imaging of the chest was performed following the standard protocol without IV contrast. COMPARISON:  Chest x-ray 04/20/2021, CT chest 10/23/2013 FINDINGS: Cardiovascular: Heart is mildly enlarged. No pericardial effusion identified. Extensive coronary artery calcifications. Main pulmonary artery is normal caliber. There is an approximately 3.4 x 2.6 x 4 cm saccular aneurysm at the distal descending thoracic aorta on the right. Severe atherosclerotic plaques throughout the thoracic aorta. Mediastinum/Nodes: No bulky lymphadenopathy identified. Esophagus is mildly distended and fluid-filled. Lungs/Pleura: Small to moderate bilateral pleural effusions with associated compressive atelectasis and irregular interstitial and airspace densities in the adjacent lower lobes and posterior aspect of the right upper lobe. Moderate emphysematous changes of the lungs. Biapical pleural thickening. Small foci of mucous plugging within bilateral lower lobe bronchi. No pneumothorax. Upper Abdomen: No acute process identified in the upper abdomen. Multiple hepatic hypodense likely cysts. Hypodense likely cysts in left kidney. Left renal atrophy. Musculoskeletal: No suspicious bony lesions identified. IMPRESSION: 1. Bilateral small to moderate pleural effusions with associated atelectasis/infiltrates, right greater than left. 2. Emphysema.  Scattered small mucous plugging. 3. Mild cardiomegaly. Severe coronary artery disease and atherosclerotic disease. 4. Saccular aneurysm at the distal descending thoracic aorta measuring up to 4 cm. New since  previous study, follow-up recommended. 5. Other findings as described. Aortic aneurysm NOS (ICD10-I71.9). Electronically Signed   By: Jannifer Hick M.D.   On: 04/22/2021 11:47     LOS: 5 days   Jeoffrey Massed, MD  [facility]    To contact the attending provider between 7A-7P or the covering provider during after hours 7P-7A, please log into the web site [email or web address] and access using universal [place] password for that web site. If you do not have the password, please call the hospital operator.  04/22/2021, 3:09 PM

## 2021-04-22 NOTE — Progress Notes (Addendum)
Occupational Therapy Treatment Patient Details Name: Ray Smith MRN: [id number] DOB: 1946/01/11 Today's Date: 04/22/2021   History of present illness 75 year old thin cachectic chronically ill-appearing Caucasian male with a past medical history significant for but not limited to systolic CHF with an EF of 25 to 30%, COPD with O2 at 5 L, CAD with history of STEMI, HLD, hypertension, marijuana abuse, tobacco dependence, history of nephrolithiasis as well as other comorbidities who presented for worsening shortness of breath and cough.   OT comments  Patient seen for OT treatment and O2 compliancy note.  Patient able to walk to the bathroom this date with up to Min A, RW and cues for safety and direction.  OT can continue to follow him in the acute setting to ensure a safe transition to the next level of care.  SNF continues to be recommended, but the patient is currently refusing.  Deficts impacting independence remain as previously documented.  The patient does not demonstrate adequate knowledge of his deficits, and presents with safety concerns.  He needs 24 hour care, but declines assist.  States he has a "lady friend" that helps him, but the patient does not provide any details as of the level of assist.     Recommendations for follow up therapy are one component of a multi-disciplinary discharge planning process, led by the attending physician.  Recommendations may be updated based on patient status, additional functional criteria and insurance authorization.    Follow Up Recommendations  Skilled nursing-short term rehab (<3 hours/day)    Assistance Recommended at Discharge    Equipment Recommendations  [facility]    Recommendations for Other Services      Precautions / Restrictions Precautions Precautions: Fall Precaution Comments: watch O2 Restrictions Weight Bearing Restrictions: No       Mobility Bed Mobility Overal bed mobility: Needs Assistance Bed Mobility: Sit to  Supine Rolling: Modified independent (Device/Increase time) Sidelying to sit: Min assist            Transfers Overall transfer level: Needs assistance Equipment used: Rolling walker (2 wheels) Transfers: Sit to/from [employer] Sit to Stand: Min assist Stand pivot transfers: Min guard         General transfer comment: patient walked to the bathroom for a BM and O2 compliance note.     Balance Overall balance assessment: Needs assistance Sitting-balance support: Feet supported Sitting balance-Leahy Scale: Good     Standing balance support: Reliant on assistive device for balance;Bilateral upper extremity supported Standing balance-Leahy Scale: Poor                             ADL either performed or assessed with clinical judgement   ADL       Grooming: Wash/dry hands;Supervision/safety;Sitting                   Toilet Transfer: Minimal assistance;Rolling walker (2 wheels);Regular Toilet;Ambulation   Toileting- Clothing Manipulation and Hygiene: Sit to/from stand;Moderate assistance               Vision Baseline Vision/History: 1 Wears glasses Patient Visual Report: No change from baseline     Perception Perception Perception: Not tested   Praxis Praxis Praxis: Not tested    Cognition Arousal/Alertness: Awake/alert Behavior During Therapy: WFL for tasks assessed/performed Overall Cognitive Status: No family/caregiver present to determine baseline cognitive functioning  General Comments: following commands, but decreased insight to deficits and poor safety.                            Pertinent Vitals/ Pain       Pain Assessment: Faces Faces Pain Scale: Hurts a little bit Pain Location: generalized to bottom and back Pain Descriptors / Indicators: Discomfort;Moaning Pain Intervention(s): Monitored during session                                                           Frequency  Min 2X/week        Progress Toward Goals  OT Goals(current goals can now be found in the care plan section)  Progress towards OT goals: Progressing toward goals  Acute Rehab OT Goals Patient Stated Goal: If I have to walk, I'll go to the bathroom. OT Goal Formulation: With patient Time For Goal Achievement: 05/04/21  Plan Discharge plan remains appropriate    Co-evaluation                 AM-PAC OT "6 Clicks" Daily Activity     Outcome Measure   Help from another person eating meals?: None Help from another person taking care of personal grooming?: None Help from another person toileting, which includes using toliet, bedpan, or urinal?: A Little Help from another person bathing (including washing, rinsing, drying)?: A Lot Help from another person to put on and taking off regular upper body clothing?: A Little Help from another person to put on and taking off regular lower body clothing?: A Little 6 Click Score: 19    End of Session Equipment Utilized During Treatment: Gait belt;Rolling walker (2 wheels)  OT Visit Diagnosis: Unsteadiness on feet (R26.81);Muscle weakness (generalized) (M62.81)   Activity Tolerance Patient limited by fatigue   Patient Left in bed;with call bell/phone within reach;with nursing/sitter in room   Nurse Communication Mobility status        Time: 5726-2035 OT Time Calculation (min): 24 min  Charges: OT General Charges $OT Visit: 1 Visit OT Treatments $Self Care/Home Management : 23-37 mins  04/22/2021  RP, OTR/L  Acute Rehabilitation Services  Office:  [phone]   Suzanna Obey 04/22/2021, 2:41 PM

## 2021-04-23 DIAGNOSIS — I5043 Acute on chronic combined systolic (congestive) and diastolic (congestive) heart failure: Secondary | ICD-10-CM

## 2021-04-23 DIAGNOSIS — J449 Chronic obstructive pulmonary disease, unspecified: Secondary | ICD-10-CM | POA: Diagnosis not present

## 2021-04-23 DIAGNOSIS — E43 Unspecified severe protein-calorie malnutrition: Secondary | ICD-10-CM

## 2021-04-23 DIAGNOSIS — J189 Pneumonia, unspecified organism: Secondary | ICD-10-CM | POA: Diagnosis not present

## 2021-04-23 DIAGNOSIS — J90 Pleural effusion, not elsewhere classified: Secondary | ICD-10-CM

## 2021-04-23 DIAGNOSIS — I5020 Unspecified systolic (congestive) heart failure: Secondary | ICD-10-CM | POA: Diagnosis not present

## 2021-04-23 DIAGNOSIS — N1831 Chronic kidney disease, stage 3a: Secondary | ICD-10-CM | POA: Diagnosis not present

## 2021-04-23 LAB — CBC
HCT: 35.7 % — ABNORMAL LOW (ref 39.0–52.0)
Hemoglobin: 11.6 g/dL — ABNORMAL LOW (ref 13.0–17.0)
MCH: 28.4 pg (ref 26.0–34.0)
MCHC: 32.5 g/dL (ref 30.0–36.0)
MCV: 87.5 fL (ref 80.0–100.0)
Platelets: 225 10*3/uL (ref 150–400)
RBC: 4.08 MIL/uL — ABNORMAL LOW (ref 4.22–5.81)
RDW: 15.7 % — ABNORMAL HIGH (ref 11.5–15.5)
WBC: 15 10*3/uL — ABNORMAL HIGH (ref 4.0–10.5)
nRBC: 0 % (ref 0.0–0.2)

## 2021-04-23 LAB — PHOSPHORUS: Phosphorus: 2.1 mg/dL — ABNORMAL LOW (ref 2.5–4.6)

## 2021-04-23 LAB — BASIC METABOLIC PANEL
Anion gap: 7 (ref 5–15)
BUN: 32 mg/dL — ABNORMAL HIGH (ref 8–23)
CO2: 34 mmol/L — ABNORMAL HIGH (ref 22–32)
Calcium: 8 mg/dL — ABNORMAL LOW (ref 8.9–10.3)
Chloride: 95 mmol/L — ABNORMAL LOW (ref 98–111)
Creatinine, Ser: 1.05 mg/dL (ref 0.61–1.24)
GFR, Estimated: >60 mL/min (ref >60)
Glucose, Bld: 97 mg/dL (ref 70–99)
Potassium: 3.5 mmol/L (ref 3.5–5.1)
Sodium: 136 mmol/L (ref 135–145)

## 2021-04-23 LAB — MAGNESIUM: Magnesium: 2.4 mg/dL (ref 1.7–2.4)

## 2021-04-23 NOTE — Progress Notes (Signed)
PROGRESS NOTE        PATIENT DETAILS Name: Ray Smith Age: 75 y.o. Sex: male Date of Birth: March 26, 1946 Admit Date: 04/17/2021 Admitting Physician Carlton Adam, MD [id number], Donalee Citrin, NP  Brief Narrative: Patient is a 75 y.o. male with history of HFrEF, COPD-on home O2, CAD, HTN, HLD-who presented with shortness of breath-patient was found to have acute on chronic hypoxic respiratory failure due to community-acquired pneumonia.  See below for further details.  Subjective: Lying comfortably in bed.  Has multiple complaints regarding food.  Objective: Vitals: Blood pressure (!) 107/92, pulse 84, temperature (!) 97.5 F (36.4 C), temperature source Oral, resp. rate (!) 22, weight 45 kg, SpO2 100 %.   Exam: Gen Exam:Alert awake-not in any distress.  Frail-chronically sick appearing. HEENT:atraumatic, normocephalic Chest: Decreased air entry at bases-but otherwise no added sounds. CVS:S1S2 regular Abdomen:soft non tender, non distended Extremities:no edema Neurology: Non focal Skin: no rash   Pertinent Labs/Radiology: WBC: 15.0  10/23>>Blood culture: No growth 10/23>>Urine Culture: E. coli/Proteus (both<100,000 colonies)  10/28>> CT chest: Bilateral small/moderate pleural effusions with atelectasis/infiltrate.  Emphysema.  4 cm saccular aneurysm at the distal descending thoracic aorta. 10/26>>CXR: Bilateral patchy airspace opacities. 05/11>> Echo: EF 25-30%.   Assessment/Plan: Acute on chronic hypoxic respiratory failure due to community-acquired pneumonia: Breathing has overall improved but patient continues to have persistent leukocytosis.  Cultures negative-no other source of infection apparent.  He does have pleural effusion-I suspect this is a transudate (in the setting of CHF)-but will touch base with PCCM to see if there is any benefit from thoracocentesis.    AKI on CKD stage IIIa: AKI hemodynamically mediated-improving with  supportive care.  Asymptomatic bacteriuria: Not felt to have UTI-he has no symptoms-urine cultures are of no clinical relevance.  COPD with chronic hypoxic respiratory failure (on nocturnal O2): Not in exacerbation-continue bronchodilators.  HFrEF: Not in exacerbation-volume status stable-continue Lasix  CAD: No anginal symptoms-continue Plavix/Coreg/statin  HTN: BP stable-continue Coreg/Lasix  Hypophosphatemia: Continue to replete and recheck.  Debility/deconditioning: Appears weak-agree with PT-likely will benefit from SNF-however patient refusing-and wants to go back to his prior living situation (lives in a long-term motel).  We will try to see if home health services can be arranged.  Homelessness: Lives in a long-term motel   Procedures: None Consults: None DVT Prophylaxis: Lovenox Code Status:Full code  Family Communication: None at bedside  Time spent: 25- minutes-Greater than 50% of this time was spent in counseling, explanation of diagnosis, planning of further management, and coordination of care.  Diet: Diet Order             Diet Heart Room service appropriate? Yes; Fluid consistency: Thin  Diet effective now                      Disposition Plan: Status is: Inpatient  Remains inpatient appropriate because: Requires inpatient level of care given acuity of illness.    Barriers to Discharge: Hypoxia from PNA-on oxygen and IV antibiotics.  Not yet stable for discharge-likely will require SNF.  Antimicrobial agents: Anti-infectives (From admission, onward)    Start     Dose/Rate Route Frequency Ordered Stop   04/21/21 0000  cefTRIAXone (ROCEPHIN) 2 g in sodium chloride 0.9 % 100 mL IVPB        2 g 200 mL/hr over 30 Minutes Intravenous Every  24 hours 04/20/21 1600 04/24/21 2359   04/20/21 2200  doxycycline (VIBRA-TABS) tablet 100 mg        100 mg Oral Every 12 hours 04/20/21 1600 04/23/21 0851   04/20/21 0600  vancomycin (VANCOREADY) IVPB 750 mg/150  mL  Status:  Discontinued        750 mg 150 mL/hr over 60 Minutes Intravenous Every 48 hours 04/18/21 0022 04/18/21 0754   04/20/21 0600  vancomycin (VANCOCIN) IVPB 1000 mg/200 mL premix  Status:  Discontinued        1,000 mg 200 mL/hr over 60 Minutes Intravenous Every 48 hours 04/18/21 0754 04/20/21 1600   04/18/21 2200  ceFEPIme (MAXIPIME) 2 g in sodium chloride 0.9 % 100 mL IVPB  Status:  Discontinued        2 g 200 mL/hr over 30 Minutes Intravenous Every 24 hours 04/18/21 0022 04/20/21 1600   04/18/21 1800  ceFEPIme (MAXIPIME) 2 g in sodium chloride 0.9 % 100 mL IVPB  Status:  Discontinued        2 g 200 mL/hr over 30 Minutes Intravenous Every 24 hours 04/17/21 2355 04/18/21 0022   04/17/21 2315  vancomycin (VANCOCIN) IVPB 1000 mg/200 mL premix        1,000 mg 200 mL/hr over 60 Minutes Intravenous  Once 04/17/21 2301 04/18/21 0118   04/17/21 2315  ceFEPIme (MAXIPIME) 2 g in sodium chloride 0.9 % 100 mL IVPB        2 g 200 mL/hr over 30 Minutes Intravenous  Once 04/17/21 2301 04/17/21 2358        MEDICATIONS: Scheduled Meds:  arformoterol  15 mcg Nebulization BID   atorvastatin  80 mg Oral q1800   budesonide (PULMICORT) nebulizer solution  0.25 mg Nebulization BID   carvedilol  6.25 mg Oral BID WC   clopidogrel  75 mg Oral Daily   enoxaparin (LOVENOX) injection  30 mg Subcutaneous Q24H   furosemide  40 mg Oral Daily   guaiFENesin  1,200 mg Oral BID   melatonin  3 mg Oral QHS   mirtazapine  7.5 mg Oral QHS   pantoprazole  40 mg Oral Q1200   umeclidinium bromide  1 puff Inhalation Daily   Continuous Infusions:  cefTRIAXone (ROCEPHIN)  IV 2 g (04/22/21 2256)   PRN Meds:.acetaminophen **OR** acetaminophen, albuterol, alum & mag hydroxide-simeth   I have personally reviewed following labs and imaging studies  LABORATORY DATA: CBC: Recent Labs  Lab 04/17/21 2100 04/18/21 0429 04/19/21 0412 04/20/21 0142 04/21/21 0113 04/22/21 0116 04/23/21 0142  WBC 16.0*   < >  13.6* 15.2* 16.1* 15.6* 15.0*  NEUTROABS 14.6*  --  12.5* 13.9*  --   --   --   HGB 14.8   < > 11.7* 12.7* 11.5* 11.7* 11.6*  HCT 44.7   < > 36.1* 38.9* 35.2* 35.3* 35.7*  MCV 86.0   < > 87.0 87.6 86.3 86.7 87.5  PLT 220   < > 191 216 218 210 225   < > = values in this interval not displayed.     Basic Metabolic Panel: Recent Labs  Lab 04/18/21 0011 04/18/21 0429 04/19/21 0412 04/20/21 0142 04/21/21 0113 04/22/21 0116 04/23/21 0142  NA  --    < > 136 138 135 137 136  K  --    < > 3.0* 3.5 3.9 3.7 3.5  CL  --    < > 98 100 96* 95* 95*  CO2  --    < >  30 31 31  33* 34*  GLUCOSE  --    < > 118* 151* 98 89 97  BUN  --    < > 41* 37* 35* 30* 32*  CREATININE  --    < > 1.50* 1.33* 1.09 1.07 1.05  CALCIUM  --    < > 8.0* 8.3* 8.1* 7.8* 8.0*  MG 2.1  --  2.0 2.0  --  1.8 2.4  PHOS  --   --  1.7* 1.3* 2.2* 4.4 2.1*   < > = values in this interval not displayed.     GFR: Estimated Creatinine Clearance: 38.1 mL/min (by C-G formula based on SCr of 1.05 mg/dL).  Liver Function Tests: Recent Labs  Lab 04/17/21 2100 04/19/21 0412 04/20/21 0142 04/22/21 0116  AST 24 21 27  33  ALT 14 11 12 18   ALKPHOS 114 87 96 91  BILITOT 2.0* 1.0 1.1 0.5  PROT 6.8 5.1* 5.5* 5.5*  ALBUMIN 2.5* 1.9* 2.0* 1.9*    No results for input(s): LIPASE, AMYLASE in the last 168 hours. No results for input(s): AMMONIA in the last 168 hours.  Coagulation Profile: Recent Labs  Lab 04/17/21 2100  INR 1.0     Cardiac Enzymes: No results for input(s): CKTOTAL, CKMB, CKMBINDEX, TROPONINI in the last 168 hours.  BNP (last 3 results) No results for input(s): PROBNP in the last 8760 hours.  Lipid Profile: No results for input(s): CHOL, HDL, LDLCALC, TRIG, CHOLHDL, LDLDIRECT in the last 72 hours.  Thyroid Function Tests: No results for input(s): TSH, T4TOTAL, FREET4, T3FREE, THYROIDAB in the last 72 hours.  Anemia Panel: No results for input(s): VITAMINB12, FOLATE, FERRITIN, TIBC, IRON,  RETICCTPCT in the last 72 hours.  Urine analysis:    Component Value Date/Time   COLORURINE YELLOW 04/17/2021 2058   APPEARANCEUR CLEAR 04/17/2021 2058   LABSPEC 1.012 04/17/2021 2058   PHURINE 5.0 04/17/2021 2058   GLUCOSEU 50 (A) 04/17/2021 2058   HGBUR NEGATIVE 04/17/2021 2058   BILIRUBINUR NEGATIVE 04/17/2021 2058   KETONESUR NEGATIVE 04/17/2021 2058   PROTEINUR NEGATIVE 04/17/2021 2058   NITRITE NEGATIVE 04/17/2021 2058   LEUKOCYTESUR NEGATIVE 04/17/2021 2058    Sepsis Labs: Lactic Acid, Venous    Component Value Date/Time   LATICACIDVEN 1.4 04/18/2021 0230    MICROBIOLOGY: Recent Results (from the past 240 hour(s))  Urine Culture     Status: Abnormal   Collection Time: 04/17/21  8:30 PM   Specimen: In/Out Cath Urine  Result Value Ref Range Status   Specimen Description IN/OUT CATH URINE  Final   Special Requests   Final    NONE Performed at [facility], [address] [place], [place] [address]    Culture (A)  Final    20,000 COLONIES/mL ESCHERICHIA COLI 10,000 COLONIES/mL PROTEUS MIRABILIS    Report Status 04/20/2021 FINAL  Final   Organism ID, Bacteria ESCHERICHIA COLI (A)  Final   Organism ID, Bacteria PROTEUS MIRABILIS (A)  Final      Susceptibility   Escherichia coli - MIC*    AMPICILLIN <=2 SENSITIVE Sensitive     CEFAZOLIN <=4 SENSITIVE Sensitive     CEFEPIME <=0.12 SENSITIVE Sensitive     CEFTRIAXONE <=0.25 SENSITIVE Sensitive     CIPROFLOXACIN <=0.25 SENSITIVE Sensitive     GENTAMICIN <=1 SENSITIVE Sensitive     IMIPENEM <=0.25 SENSITIVE Sensitive     NITROFURANTOIN <=16 SENSITIVE Sensitive     TRIMETH/SULFA <=20 SENSITIVE Sensitive     AMPICILLIN/SULBACTAM <=2 SENSITIVE  Sensitive     PIP/TAZO <=4 SENSITIVE Sensitive     * 20,000 COLONIES/mL ESCHERICHIA COLI   Proteus mirabilis - MIC*    AMPICILLIN <=2 SENSITIVE Sensitive     CEFAZOLIN <=4 SENSITIVE Sensitive     CEFEPIME <=0.12 SENSITIVE Sensitive     CEFTRIAXONE <=0.25 SENSITIVE  Sensitive     CIPROFLOXACIN <=0.25 SENSITIVE Sensitive     GENTAMICIN <=1 SENSITIVE Sensitive     IMIPENEM 2 SENSITIVE Sensitive     NITROFURANTOIN 128 RESISTANT Resistant     TRIMETH/SULFA <=20 SENSITIVE Sensitive     AMPICILLIN/SULBACTAM <=2 SENSITIVE Sensitive     PIP/TAZO <=4 SENSITIVE Sensitive     * 10,000 COLONIES/mL PROTEUS MIRABILIS  Resp Panel by RT-PCR (Flu A&B, Covid) Nasopharyngeal Swab     Status: None   Collection Time: 04/17/21  8:31 PM   Specimen: Nasopharyngeal Swab; Nasopharyngeal(NP) swabs in vial transport medium  Result Value Ref Range Status   SARS Coronavirus 2 by RT PCR NEGATIVE NEGATIVE Final    Comment: (NOTE) SARS-CoV-2 target nucleic acids are NOT DETECTED.  The SARS-CoV-2 RNA is generally detectable in upper respiratory specimens during the acute phase of infection. The lowest concentration of SARS-CoV-2 viral copies this assay can detect is 138 copies/mL. A negative result does not preclude SARS-Cov-2 infection and should not be used as the sole basis for treatment or other patient management decisions. A negative result may occur with  improper specimen collection/handling, submission of specimen other than nasopharyngeal swab, presence of viral mutation(s) within the areas targeted by this assay, and inadequate number of viral copies(<138 copies/mL). A negative result must be combined with clinical observations, patient history, and epidemiological information. The expected result is Negative.  Fact Sheet for Patients:  BloggerCourse.com  Fact Sheet for Healthcare Providers:  SeriousBroker.it  This test is no t yet approved or cleared by the [place] FDA and  has been authorized for detection and/or diagnosis of SARS-CoV-2 by FDA under an Emergency Use Authorization (EUA). This EUA will remain  in effect (meaning this test can be used) for the duration of the COVID-19 declaration under  Section 564(b)(1) of the Act, 21 U.S.C.section 360bbb-3(b)(1), unless the authorization is terminated  or revoked sooner.       Influenza A by PCR NEGATIVE NEGATIVE Final   Influenza B by PCR NEGATIVE NEGATIVE Final    Comment: (NOTE) The Xpert Xpress SARS-CoV-2/FLU/RSV plus assay is intended as an aid in the diagnosis of influenza from Nasopharyngeal swab specimens and should not be used as a sole basis for treatment. Nasal washings and aspirates are unacceptable for Xpert Xpress SARS-CoV-2/FLU/RSV testing.  Fact Sheet for Patients: BloggerCourse.com  Fact Sheet for Healthcare Providers: SeriousBroker.it  This test is not yet approved or cleared by the [place] FDA and has been authorized for detection and/or diagnosis of SARS-CoV-2 by FDA under an Emergency Use Authorization (EUA). This EUA will remain in effect (meaning this test can be used) for the duration of the COVID-19 declaration under Section 564(b)(1) of the Act, 21 U.S.C. section 360bbb-3(b)(1), unless the authorization is terminated or revoked.  Performed at [facility], [address], [place], [place] [address]   Blood culture (routine x 2)     Status: None   Collection Time: 04/17/21  9:00 PM   Specimen: BLOOD RIGHT ARM  Result Value Ref Range Status   Specimen Description BLOOD RIGHT ARM  Final   Special Requests   Final    BOTTLES DRAWN  AEROBIC AND ANAEROBIC Blood Culture adequate volume   Culture   Final    NO GROWTH 5 DAYS Performed at [facility], [address], [place], [place] [address]    Report Status 04/22/2021 FINAL  Final  Blood culture (routine x 2)     Status: None   Collection Time: 04/17/21  9:00 PM   Specimen: BLOOD  Result Value Ref Range Status   Specimen Description BLOOD RIGHT ANTECUBITAL  Final   Special Requests AEROBIC BOTTLE ONLY Blood Culture adequate volume  Final   Culture   Final    NO GROWTH 5  DAYS Performed at [facility], [address] [place], [place] [address]    Report Status 04/22/2021 FINAL  Final  MRSA Next Gen by PCR, Nasal     Status: None   Collection Time: 04/19/21  1:09 PM   Specimen: Nasal Mucosa; Nasal Swab  Result Value Ref Range Status   MRSA by PCR Next Gen NOT DETECTED NOT DETECTED Final    Comment: (NOTE) The GeneXpert MRSA Assay (FDA approved for NASAL specimens only), is one component of a comprehensive MRSA colonization surveillance program. It is not intended to diagnose MRSA infection nor to guide or monitor treatment for MRSA infections. Test performance is not FDA approved in patients less than 62 years old. Performed at [facility], [address] [place], [place] [address]     RADIOLOGY STUDIES/RESULTS: CT CHEST WO CONTRAST  Result Date: 04/22/2021 CLINICAL DATA:  Pneumonia, effusion EXAM: CT CHEST WITHOUT CONTRAST TECHNIQUE: Multidetector CT imaging of the chest was performed following the standard protocol without IV contrast. COMPARISON:  Chest x-ray 04/20/2021, CT chest 10/23/2013 FINDINGS: Cardiovascular: Heart is mildly enlarged. No pericardial effusion identified. Extensive coronary artery calcifications. Main pulmonary artery is normal caliber. There is an approximately 3.4 x 2.6 x 4 cm saccular aneurysm at the distal descending thoracic aorta on the right. Severe atherosclerotic plaques throughout the thoracic aorta. Mediastinum/Nodes: No bulky lymphadenopathy identified. Esophagus is mildly distended and fluid-filled. Lungs/Pleura: Small to moderate bilateral pleural effusions with associated compressive atelectasis and irregular interstitial and airspace densities in the adjacent lower lobes and posterior aspect of the right upper lobe. Moderate emphysematous changes of the lungs. Biapical pleural thickening. Small foci of mucous plugging within bilateral lower lobe bronchi. No pneumothorax. Upper Abdomen: No acute process  identified in the upper abdomen. Multiple hepatic hypodense likely cysts. Hypodense likely cysts in left kidney. Left renal atrophy. Musculoskeletal: No suspicious bony lesions identified. IMPRESSION: 1. Bilateral small to moderate pleural effusions with associated atelectasis/infiltrates, right greater than left. 2. Emphysema.  Scattered small mucous plugging. 3. Mild cardiomegaly. Severe coronary artery disease and atherosclerotic disease. 4. Saccular aneurysm at the distal descending thoracic aorta measuring up to 4 cm. New since previous study, follow-up recommended. 5. Other findings as described. Aortic aneurysm NOS (ICD10-I71.9). Electronically Signed   By: Jannifer Hick M.D.   On: 04/22/2021 11:47     LOS: 6 days   Jeoffrey Massed, MD  [facility]    To contact the attending provider between 7A-7P or the covering provider during after hours 7P-7A, please log into the web site [email or web address] and access using universal [place] password for that web site. If you do not have the password, please call the hospital operator.  04/23/2021, 1:19 PM

## 2021-04-23 NOTE — Consult Note (Signed)
NAME:  Ray Smith, MRN:  [id number], DOB:  12/20/1945, LOS: 6 ADMISSION DATE:  04/17/2021, CONSULTATION DATE:  04/23/2021 REFERRING MD:  Dr. Jerral Ralph, CHIEF COMPLAINT:  Pleural effusion   History of Present Illness:  75 yo male presented to ER from home with dyspnea and wasn't able to get out of his chair for a couple of days.  Coughing green sputum.  Started on antibiotics for pneumonia.  Found to have persistent leukocytosis and pleural effusions.  PCCM asked to assess whether thoracentesis would be beneficial.  He was feeling very weak when he came to hospital.  Had trouble getting his medications.  Has cough with sputum still, but phlegm now clear.  Not having chest pain, wheeze, or hemoptysis.  Pertinent  Medical History  Combined CHF with EF 25%, COPD, CAD, HTN, HLD  Studies  Echo 11/03/20 >> EF 25 to 30%, grade 2 DD, mod LA dilation CT chest 10/28 >> atherosclerosis, 4 cm saccular aneurysm distal descending thoracic aorta, small to mod b/l effusions with compressive Atx, Asd lower lobes and RUL, moderate emphysema, apical scarring  Interim History / Subjective:    Objective   Blood pressure (!) 107/92, pulse 84, temperature (!) 97.5 F (36.4 C), temperature source Oral, resp. rate (!) 22, weight 45 kg, SpO2 100 %.        Intake/Output Summary (Last 24 hours) at 04/23/2021 1418 Last data filed at 04/23/2021 1407 Gross per 24 hour  Intake 890 ml  Output 2675 ml  Net -1785 ml   Filed Weights   04/20/21 0500 04/21/21 0500 04/22/21 0500  Weight: 44.1 kg 49 kg 45 kg    Examination:  General - alert Eyes - pupils reactive ENT - no sinus tenderness, no stridor, decreased hearing acuity Cardiac - regular rate/rhythm, no murmur Chest - decreased breath sounds b/l, no wheezing Abdomen - soft, non tender, + bowel sounds Extremities - decreased muscle bulk, no edema Skin - no rashes Neuro - normal strength, moves extremities, follows commands Psych - normal mood and  behavior    Discussion  75 yo male with bilateral pleural effusions.  These are more likely related to congestive heart failure and low oncotic state with severe protein calorie malnutrition.  I am less concerned about infectious process causing effusions, especially since he reports clinical improvement and not currently requiring supplemental oxygen.  Assessment & Plan:   Bilateral pleural effusions. - continue lasix - f/u 2 view chest xray on 10/31 - defer thoracentesis for now  Community acquired pneumonia. - has been on 7 days of ABx; per primary team  COPD with emphysema. - continue BDs  Acute on chronic combined CHF. HLD, CAD. - per primary team  Distal thoracic aorta saccular aneurysm. - will need follow up imaging as an outpt  Severe protein calorie malnutrition. - encouraged po intake  Deconditioning. - discussed importance of keeping up with PT   Labs   CBC: Recent Labs  Lab 04/17/21 2100 04/18/21 0429 04/19/21 0412 04/20/21 0142 04/21/21 0113 04/22/21 0116 04/23/21 0142  WBC 16.0*   < > 13.6* 15.2* 16.1* 15.6* 15.0*  NEUTROABS 14.6*  --  12.5* 13.9*  --   --   --   HGB 14.8   < > 11.7* 12.7* 11.5* 11.7* 11.6*  HCT 44.7   < > 36.1* 38.9* 35.2* 35.3* 35.7*  MCV 86.0   < > 87.0 87.6 86.3 86.7 87.5  PLT 220   < > 191 216 218 210 225   < > =  values in this interval not displayed.    Basic Metabolic Panel: Recent Labs  Lab 04/18/21 0011 04/18/21 0429 04/19/21 0412 04/20/21 0142 04/21/21 0113 04/22/21 0116 04/23/21 0142  NA  --    < > 136 138 135 137 136  K  --    < > 3.0* 3.5 3.9 3.7 3.5  CL  --    < > 98 100 96* 95* 95*  CO2  --    < > 30 31 31  33* 34*  GLUCOSE  --    < > 118* 151* 98 89 97  BUN  --    < > 41* 37* 35* 30* 32*  CREATININE  --    < > 1.50* 1.33* 1.09 1.07 1.05  CALCIUM  --    < > 8.0* 8.3* 8.1* 7.8* 8.0*  MG 2.1  --  2.0 2.0  --  1.8 2.4  PHOS  --   --  1.7* 1.3* 2.2* 4.4 2.1*   < > = values in this interval not  displayed.   GFR: Estimated Creatinine Clearance: 38.1 mL/min (by C-G formula based on SCr of 1.05 mg/dL). Recent Labs  Lab 04/17/21 2100 04/18/21 0230 04/18/21 0429 04/20/21 0142 04/21/21 0113 04/22/21 0116 04/23/21 0142  WBC 16.0*  --    < > 15.2* 16.1* 15.6* 15.0*  LATICACIDVEN 1.5 1.4  --   --   --   --   --    < > = values in this interval not displayed.    Liver Function Tests: Recent Labs  Lab 04/17/21 2100 04/19/21 0412 04/20/21 0142 04/22/21 0116  AST 24 21 27  33  ALT 14 11 12 18   ALKPHOS 114 87 96 91  BILITOT 2.0* 1.0 1.1 0.5  PROT 6.8 5.1* 5.5* 5.5*  ALBUMIN 2.5* 1.9* 2.0* 1.9*   No results for input(s): LIPASE, AMYLASE in the last 168 hours. No results for input(s): AMMONIA in the last 168 hours.  ABG    Component Value Date/Time   PHART 7.507 (H) 01/18/2018 1230   PCO2ART 24.8 (L) 01/18/2018 1230   PO2ART 61.4 (L) 01/18/2018 1230   HCO3 19.6 (L) 01/18/2018 1230   TCO2 21 (L) 12/30/2017 0220   ACIDBASEDEF 3.1 (H) 01/18/2018 1230   O2SAT 67.4 12/21/2019 1920     Coagulation Profile: Recent Labs  Lab 04/17/21 2100  INR 1.0    Cardiac Enzymes: No results for input(s): CKTOTAL, CKMB, CKMBINDEX, TROPONINI in the last 168 hours.  HbA1C: Hgb A1c MFr Bld  Date/Time Value Ref Range Status  11/19/2020 10:51 AM 5.6 4.8 - 5.6 % Final    Comment:    (NOTE)         Prediabetes: 5.7 - 6.4         Diabetes: >6.4         Glycemic control for adults with diabetes: <7.0   12/30/2017 04:05 AM 5.4 4.8 - 5.6 % Final    Comment:    (NOTE) Pre diabetes:          5.7%-6.4% Diabetes:              >6.4% Glycemic control for   <7.0% adults with diabetes     CBG: No results for input(s): GLUCAP in the last 168 hours.  Review of Systems:   Reviewed and negative  Past Medical History:  He,  has a past medical history of Acute ST elevation myocardial infarction (STEMI) involving left anterior descending (LAD) coronary artery (HCC) (12/30/2017), Acute  systolic heart failure (HCC) (78/46/9629), Congestive heart failure (CHF) (HCC), COPD (chronic obstructive pulmonary disease) (HCC), Coronary artery disease, History of kidney stones, Hyperlipidemia, Hypertension, Marijuana abuse, MI (mitral incompetence), and Tobacco dependence.   Surgical History:   Past Surgical History:  Procedure Laterality Date   CARDIAC CATHETERIZATION     CORONARY/GRAFT ACUTE MI REVASCULARIZATION N/A 12/30/2017   Procedure: Coronary/Graft Acute MI Revascularization;  Surgeon: Tonny Bollman, MD;  Location: [facility] INVASIVE CV LAB;  Service: Cardiovascular;  Laterality: N/A;  Distal LM into LAD Xience [place] 3.5x35mm Ostial Cx Xience [place] 3.5x18mm   LEFT HEART CATH AND CORONARY ANGIOGRAPHY N/A 12/30/2017   Procedure: LEFT HEART CATH AND CORONARY ANGIOGRAPHY;  Surgeon: Tonny Bollman, MD;  Location: [facility] INVASIVE CV LAB;  Service: Cardiovascular;  Laterality: N/A;   LEFT HEART CATHETERIZATION WITH CORONARY ANGIOGRAM Bilateral 10/23/2013   Procedure: LEFT HEART CATHETERIZATION WITH CORONARY ANGIOGRAM;  Surgeon: Lesleigh Noe, MD;  Location: [facility] CATH LAB;  Service: Cardiovascular;  Laterality: Bilateral;   snake bite surgery     TONSILLECTOMY  1955   TOTAL KNEE ARTHROPLASTY  2001     Social History:   reports that he has quit smoking. He has never used smokeless tobacco. He reports that he does not currently use alcohol. He reports that he does not currently use drugs after having used the following drugs: Marijuana.   Family History:  His family history includes Alzheimer's disease in his father and mother; Congestive Heart Failure in his father; Diabetes in his maternal grandmother; Drug abuse in an other family member; Heart disease in his mother; Multiple sclerosis in his sister.   Allergies Allergies  Allergen Reactions   Tramadol Anaphylaxis   Flexeril [Cyclobenzaprine] Other (See Comments)    Per patient "it made my heart stop"   Ibuprofen Other (See Comments)     Overuse damages pt's kidneys    Ibuprofen Nausea Only and Other (See Comments)    Reaction not recalled- perhaps made his stomach hurt   Lactose Intolerance (Gi) Other (See Comments)    Digestive issues     Home Medications  Prior to Admission medications   Medication Sig Start Date End Date Taking? Authorizing Provider  albuterol (PROVENTIL) (2.5 MG/3ML) 0.083% nebulizer solution Use 3 mLs (2.5 mg total) by nebulization every 6 (six) hours as needed for wheezing or shortness of breath. 03/24/21   Ngetich, Dinah C, NP  albuterol (VENTOLIN HFA) 108 (90 Base) MCG/ACT inhaler Inhale 2 puffs into the lungs every 6 (six) hours as needed for wheezing or shortness of breath. 02/14/21   Ngetich, Dinah C, NP  aspirin 81 MG EC tablet Take 1 tablet (81 mg total) by mouth daily. 11/03/20   Eliezer Bottom, MD  atorvastatin (LIPITOR) 80 MG tablet Take 1 tablet (80 mg total) by mouth daily at 6 PM. 02/14/21 05/15/21  Ngetich, Dinah C, NP  carvedilol (COREG) 6.25 MG tablet Take 1 tablet (6.25 mg total) by mouth 2 (two) times daily with a meal. 02/14/21 03/24/21  Ngetich, Dinah C, NP  cetirizine (ZYRTEC) 10 MG tablet Take 1 tablet (10 mg total) by mouth daily. 03/24/21   Sharon Seller, NP  clopidogrel (PLAVIX) 75 MG tablet Take 1 tablet (75 mg total) by mouth daily. 02/14/21 05/15/21  Ngetich, Dinah C, NP  dapagliflozin propanediol (FARXIGA) 10 MG TABS tablet Take 1 tablet (10 mg total) by mouth daily. 02/14/21   Ngetich, Dinah C, NP  furosemide (LASIX) 40 MG tablet Take 1 tablet (40 mg total) by  mouth daily. 02/14/21 03/24/21  Ngetich, Dinah C, NP  ipratropium (ATROVENT) 0.03 % nasal spray Place 2 sprays into both nostrils every 12 (twelve) hours. 02/14/21   Ngetich, Dinah C, NP  mirtazapine (REMERON) 7.5 MG tablet Take 1 tablet (7.5 mg total) by mouth at bedtime. 02/14/21 03/24/21  Ngetich, Dinah C, NP  nitroGLYCERIN (NITROSTAT) 0.4 MG SL tablet Place 1 tablet (0.4 mg total) under the tongue every 5 (five) minutes x 3  doses as needed for chest pain. 11/03/20   Eliezer Bottom, MD  OXYGEN Inhale 5 L/min into the lungs at bedtime as needed (for shortness of breath).    [provider]  pantoprazole (PROTONIX) 40 MG tablet TAKE 1 TABLET BY MOUTH EVERYDAY AT BEDTIME Patient taking differently: Take 40 mg by mouth at bedtime. 02/14/21   Ngetich, Dinah C, NP  polyethylene glycol powder (GLYCOLAX/MIRALAX) 17 GM/SCOOP powder Take 17 g by mouth daily. 03/24/21   Sharon Seller, NP  Tiotropium Bromide-Olodaterol (STIOLTO RESPIMAT) 2.5-2.5 MCG/ACT AERS INHALE 2 PUFFS BY MOUTH INTO THE LUNGS DAILY Patient taking differently: Inhale 2 puffs into the lungs daily. 02/14/21   Ngetich, Donalee Citrin, NP     Signature:  Coralyn Helling, MD [facility] Pulmonary/Critical Care Pager - [phone] 04/23/2021, 2:53 PM

## 2021-04-23 NOTE — Progress Notes (Signed)
PT Cancellation Note  Patient Details Name: Ray Smith MRN: [id number] DOB: 08/12/1945   Cancelled Treatment:    Reason Eval/Treat Not Completed: Other (comment)  Patient reports he already walked to bathroom this morning and is not up for it right now. States he is going to have a procedure to remove fluid from around his heart and he'll feel much better then and would be willing to walk after that.   Ray Smith, PT Acute Rehabilitation Services  Pager [phone] Office [phone]   Zena Amos 04/23/2021, 2:18 PM

## 2021-04-24 DIAGNOSIS — I5020 Unspecified systolic (congestive) heart failure: Secondary | ICD-10-CM | POA: Diagnosis not present

## 2021-04-24 DIAGNOSIS — J449 Chronic obstructive pulmonary disease, unspecified: Secondary | ICD-10-CM | POA: Diagnosis not present

## 2021-04-24 DIAGNOSIS — J189 Pneumonia, unspecified organism: Secondary | ICD-10-CM | POA: Diagnosis not present

## 2021-04-24 DIAGNOSIS — N1831 Chronic kidney disease, stage 3a: Secondary | ICD-10-CM | POA: Diagnosis not present

## 2021-04-24 LAB — CBC WITH DIFFERENTIAL/PLATELET
Abs Immature Granulocytes: 0.12 10*3/uL — ABNORMAL HIGH (ref 0.00–0.07)
Basophils Absolute: 0 10*3/uL (ref 0.0–0.1)
Basophils Relative: 0 %
Eosinophils Absolute: 0.1 10*3/uL (ref 0.0–0.5)
Eosinophils Relative: 0 %
HCT: 36.9 % — ABNORMAL LOW (ref 39.0–52.0)
Hemoglobin: 11.7 g/dL — ABNORMAL LOW (ref 13.0–17.0)
Immature Granulocytes: 1 %
Lymphocytes Relative: 3 %
Lymphs Abs: 0.4 10*3/uL — ABNORMAL LOW (ref 0.7–4.0)
MCH: 28.3 pg (ref 26.0–34.0)
MCHC: 31.7 g/dL (ref 30.0–36.0)
MCV: 89.1 fL (ref 80.0–100.0)
Monocytes Absolute: 1 10*3/uL (ref 0.1–1.0)
Monocytes Relative: 7 %
Neutro Abs: 12.9 10*3/uL — ABNORMAL HIGH (ref 1.7–7.7)
Neutrophils Relative %: 89 %
Platelets: 200 10*3/uL (ref 150–400)
RBC: 4.14 MIL/uL — ABNORMAL LOW (ref 4.22–5.81)
RDW: 16 % — ABNORMAL HIGH (ref 11.5–15.5)
WBC: 14.5 10*3/uL — ABNORMAL HIGH (ref 4.0–10.5)
nRBC: 0 % (ref 0.0–0.2)

## 2021-04-24 LAB — BASIC METABOLIC PANEL
Anion gap: 10 (ref 5–15)
BUN: 30 mg/dL — ABNORMAL HIGH (ref 8–23)
CO2: 28 mmol/L (ref 22–32)
Calcium: 7.9 mg/dL — ABNORMAL LOW (ref 8.9–10.3)
Chloride: 99 mmol/L (ref 98–111)
Creatinine, Ser: 1.19 mg/dL (ref 0.61–1.24)
GFR, Estimated: >60 mL/min (ref >60)
Glucose, Bld: 118 mg/dL — ABNORMAL HIGH (ref 70–99)
Potassium: 3.7 mmol/L (ref 3.5–5.1)
Sodium: 137 mmol/L (ref 135–145)

## 2021-04-24 LAB — MAGNESIUM: Magnesium: 2 mg/dL (ref 1.7–2.4)

## 2021-04-24 NOTE — Plan of Care (Signed)
  Problem: Clinical Measurements: Goal: Diagnostic test results will improve Outcome: Progressing   Problem: Clinical Measurements: Goal: Will remain free from infection Outcome: Progressing Goal: Diagnostic test results will improve Outcome: Progressing Goal: Respiratory complications will improve Outcome: Progressing

## 2021-04-24 NOTE — Progress Notes (Signed)
PROGRESS NOTE        PATIENT DETAILS Name: Ray Smith Age: 75 y.o. Sex: male Date of Birth: May 06, 1946 Admit Date: 04/17/2021 Admitting Physician Carlton Adam, MD ZOX:[id number], Donalee Citrin, NP  Brief Narrative: Patient is a 75 y.o. male with history of HFrEF, COPD-on home O2, CAD, HTN, HLD-who presented with shortness of breath-patient was found to have acute on chronic hypoxic respiratory failure due to community-acquired pneumonia.  See below for further details.  Subjective: Lying comfortably in bed-continues to have multiple complaints about food, continues to claim that "some doctor gave him a pill in the past that caused him to have some complications close:  Objective: Vitals: Blood pressure 116/82, pulse 99, temperature 98 F (36.7 C), temperature source Oral, resp. rate 20, weight 45.4 kg, SpO2 100 %.   Exam: Gen Exam:Alert awake-not in any distress HEENT:atraumatic, normocephalic Chest: B/L clear to auscultation anteriorly CVS:S1S2 regular Abdomen:soft non tender, non distended Extremities:no edema Neurology: Non focal Skin: no rash   Pertinent Labs/Radiology: WBC: 14.5  10/23>>Blood culture: No growth 10/23>>Urine Culture: E. coli/Proteus (both<100,000 colonies)  10/28>> CT chest: Bilateral small/moderate pleural effusions with atelectasis/infiltrate.  Emphysema.  4 cm saccular aneurysm at the distal descending thoracic aorta. 10/26>>CXR: Bilateral patchy airspace opacities. 05/11>> Echo: EF 25-30%.   Assessment/Plan: Acute on chronic hypoxic respiratory failure due to community-acquired pneumonia: Overall improved-has completed a course of antibiotics.  All cultures negative.  Appreciate PCCM input-regarding pleural effusion-not felt to benefit from thoracocentesis at this point.  Highly likely that this pleural effusion is a transudate given history of CHF.  AKI on CKD stage IIIa: AKI hemodynamically mediated-improving with  supportive care.  Asymptomatic bacteriuria: Not felt to have UTI-he has no symptoms-urine cultures are of no clinical relevance.  COPD with chronic hypoxic respiratory failure (on nocturnal O2): Not in exacerbation-continue bronchodilators.  HFrEF: Not in exacerbation-volume status stable-continue Lasix  CAD: No anginal symptoms-continue Plavix/Coreg/statin  HTN: BP stable-continue Coreg/Lasix  Hypophosphatemia: Repleted.  Debility/deconditioning: Appears weak-agree with PT-likely will benefit from SNF-however patient refusing-and wants to go back to his prior living situation (lives in a long-term motel).  We will try to see if home health services can be arranged.  Homelessness: Lives in a long-term motel   Procedures: None Consults: None DVT Prophylaxis: Lovenox Code Status:Full code  Family Communication: None at bedside  Time spent: 25- minutes-Greater than 50% of this time was spent in counseling, explanation of diagnosis, planning of further management, and coordination of care.  Diet: Diet Order             Diet Heart Room service appropriate? Yes; Fluid consistency: Thin  Diet effective now                      Disposition Plan: Status is: Inpatient  Remains inpatient appropriate because: Requires inpatient level of care given acuity of illness.    Barriers to Discharge: Hypoxia from PNA-on oxygen and IV antibiotics.  Not yet stable for discharge-likely will require SNF.  Antimicrobial agents: Anti-infectives (From admission, onward)    Start     Dose/Rate Route Frequency Ordered Stop   04/21/21 0000  cefTRIAXone (ROCEPHIN) 2 g in sodium chloride 0.9 % 100 mL IVPB        2 g 200 mL/hr over 30 Minutes Intravenous Every 24 hours 04/20/21 1600 04/24/21 0705  04/20/21 2200  doxycycline (VIBRA-TABS) tablet 100 mg        100 mg Oral Every 12 hours 04/20/21 1600 04/23/21 0851   04/20/21 0600  vancomycin (VANCOREADY) IVPB 750 mg/150 mL  Status:   Discontinued        750 mg 150 mL/hr over 60 Minutes Intravenous Every 48 hours 04/18/21 0022 04/18/21 0754   04/20/21 0600  vancomycin (VANCOCIN) IVPB 1000 mg/200 mL premix  Status:  Discontinued        1,000 mg 200 mL/hr over 60 Minutes Intravenous Every 48 hours 04/18/21 0754 04/20/21 1600   04/18/21 2200  ceFEPIme (MAXIPIME) 2 g in sodium chloride 0.9 % 100 mL IVPB  Status:  Discontinued        2 g 200 mL/hr over 30 Minutes Intravenous Every 24 hours 04/18/21 0022 04/20/21 1600   04/18/21 1800  ceFEPIme (MAXIPIME) 2 g in sodium chloride 0.9 % 100 mL IVPB  Status:  Discontinued        2 g 200 mL/hr over 30 Minutes Intravenous Every 24 hours 04/17/21 2355 04/18/21 0022   04/17/21 2315  vancomycin (VANCOCIN) IVPB 1000 mg/200 mL premix        1,000 mg 200 mL/hr over 60 Minutes Intravenous  Once 04/17/21 2301 04/18/21 0118   04/17/21 2315  ceFEPIme (MAXIPIME) 2 g in sodium chloride 0.9 % 100 mL IVPB        2 g 200 mL/hr over 30 Minutes Intravenous  Once 04/17/21 2301 04/17/21 2358        MEDICATIONS: Scheduled Meds:  arformoterol  15 mcg Nebulization BID   atorvastatin  80 mg Oral q1800   budesonide (PULMICORT) nebulizer solution  0.25 mg Nebulization BID   carvedilol  6.25 mg Oral BID WC   clopidogrel  75 mg Oral Daily   enoxaparin (LOVENOX) injection  30 mg Subcutaneous Q24H   furosemide  40 mg Oral Daily   guaiFENesin  1,200 mg Oral BID   melatonin  3 mg Oral QHS   mirtazapine  7.5 mg Oral QHS   pantoprazole  40 mg Oral Q1200   umeclidinium bromide  1 puff Inhalation Daily   Continuous Infusions:   PRN Meds:.acetaminophen **OR** acetaminophen, albuterol, alum & mag hydroxide-simeth   I have personally reviewed following labs and imaging studies  LABORATORY DATA: CBC: Recent Labs  Lab 04/17/21 2100 04/18/21 0429 04/19/21 0412 04/20/21 0142 04/21/21 0113 04/22/21 0116 04/23/21 0142 04/24/21 0806  WBC 16.0*   < > 13.6* 15.2* 16.1* 15.6* 15.0* 14.5*  NEUTROABS  14.6*  --  12.5* 13.9*  --   --   --  12.9*  HGB 14.8   < > 11.7* 12.7* 11.5* 11.7* 11.6* 11.7*  HCT 44.7   < > 36.1* 38.9* 35.2* 35.3* 35.7* 36.9*  MCV 86.0   < > 87.0 87.6 86.3 86.7 87.5 89.1  PLT 220   < > 191 216 218 210 225 200   < > = values in this interval not displayed.     Basic Metabolic Panel: Recent Labs  Lab 04/19/21 0412 04/20/21 0142 04/21/21 0113 04/22/21 0116 04/23/21 0142 04/24/21 0806  NA 136 138 135 137 136 137  K 3.0* 3.5 3.9 3.7 3.5 3.7  CL 98 100 96* 95* 95* 99  CO2 30 31 31  33* 34* 28  GLUCOSE 118* 151* 98 89 97 118*  BUN 41* 37* 35* 30* 32* 30*  CREATININE 1.50* 1.33* 1.09 1.07 1.05 1.19  CALCIUM 8.0* 8.3* 8.1* 7.8*  8.0* 7.9*  MG 2.0 2.0  --  1.8 2.4 2.0  PHOS 1.7* 1.3* 2.2* 4.4 2.1*  --      GFR: Estimated Creatinine Clearance: 33.9 mL/min (by C-G formula based on SCr of 1.19 mg/dL).  Liver Function Tests: Recent Labs  Lab 04/17/21 2100 04/19/21 0412 04/20/21 0142 04/22/21 0116  AST 24 21 27  33  ALT 14 11 12 18   ALKPHOS 114 87 96 91  BILITOT 2.0* 1.0 1.1 0.5  PROT 6.8 5.1* 5.5* 5.5*  ALBUMIN 2.5* 1.9* 2.0* 1.9*    No results for input(s): LIPASE, AMYLASE in the last 168 hours. No results for input(s): AMMONIA in the last 168 hours.  Coagulation Profile: Recent Labs  Lab 04/17/21 2100  INR 1.0     Cardiac Enzymes: No results for input(s): CKTOTAL, CKMB, CKMBINDEX, TROPONINI in the last 168 hours.  BNP (last 3 results) No results for input(s): PROBNP in the last 8760 hours.  Lipid Profile: No results for input(s): CHOL, HDL, LDLCALC, TRIG, CHOLHDL, LDLDIRECT in the last 72 hours.  Thyroid Function Tests: No results for input(s): TSH, T4TOTAL, FREET4, T3FREE, THYROIDAB in the last 72 hours.  Anemia Panel: No results for input(s): VITAMINB12, FOLATE, FERRITIN, TIBC, IRON, RETICCTPCT in the last 72 hours.  Urine analysis:    Component Value Date/Time   COLORURINE YELLOW 04/17/2021 2058   APPEARANCEUR CLEAR 04/17/2021  2058   LABSPEC 1.012 04/17/2021 2058   PHURINE 5.0 04/17/2021 2058   GLUCOSEU 50 (A) 04/17/2021 2058   HGBUR NEGATIVE 04/17/2021 2058   BILIRUBINUR NEGATIVE 04/17/2021 2058   KETONESUR NEGATIVE 04/17/2021 2058   PROTEINUR NEGATIVE 04/17/2021 2058   NITRITE NEGATIVE 04/17/2021 2058   LEUKOCYTESUR NEGATIVE 04/17/2021 2058    Sepsis Labs: Lactic Acid, Venous    Component Value Date/Time   LATICACIDVEN 1.4 04/18/2021 0230    MICROBIOLOGY: Recent Results (from the past 240 hour(s))  Urine Culture     Status: Abnormal   Collection Time: 04/17/21  8:30 PM   Specimen: In/Out Cath Urine  Result Value Ref Range Status   Specimen Description IN/OUT CATH URINE  Final   Special Requests   Final    NONE Performed at [facility], [address], [place], [address] [place]    Culture (A)  Final    20,000 COLONIES/mL ESCHERICHIA COLI 10,000 COLONIES/mL PROTEUS MIRABILIS    Report Status 04/20/2021 FINAL  Final   Organism ID, Bacteria ESCHERICHIA COLI (A)  Final   Organism ID, Bacteria PROTEUS MIRABILIS (A)  Final      Susceptibility   Escherichia coli - MIC*    AMPICILLIN <=2 SENSITIVE Sensitive     CEFAZOLIN <=4 SENSITIVE Sensitive     CEFEPIME <=0.12 SENSITIVE Sensitive     CEFTRIAXONE <=0.25 SENSITIVE Sensitive     CIPROFLOXACIN <=0.25 SENSITIVE Sensitive     GENTAMICIN <=1 SENSITIVE Sensitive     IMIPENEM <=0.25 SENSITIVE Sensitive     NITROFURANTOIN <=16 SENSITIVE Sensitive     TRIMETH/SULFA <=20 SENSITIVE Sensitive     AMPICILLIN/SULBACTAM <=2 SENSITIVE Sensitive     PIP/TAZO <=4 SENSITIVE Sensitive     * 20,000 COLONIES/mL ESCHERICHIA COLI   Proteus mirabilis - MIC*    AMPICILLIN <=2 SENSITIVE Sensitive     CEFAZOLIN <=4 SENSITIVE Sensitive     CEFEPIME <=0.12 SENSITIVE Sensitive     CEFTRIAXONE <=0.25 SENSITIVE Sensitive     CIPROFLOXACIN <=0.25 SENSITIVE Sensitive     GENTAMICIN <=1 SENSITIVE Sensitive     IMIPENEM  2 SENSITIVE Sensitive     NITROFURANTOIN 128  RESISTANT Resistant     TRIMETH/SULFA <=20 SENSITIVE Sensitive     AMPICILLIN/SULBACTAM <=2 SENSITIVE Sensitive     PIP/TAZO <=4 SENSITIVE Sensitive     * 10,000 COLONIES/mL PROTEUS MIRABILIS  Resp Panel by RT-PCR (Flu A&B, Covid) Nasopharyngeal Swab     Status: None   Collection Time: 04/17/21  8:31 PM   Specimen: Nasopharyngeal Swab; Nasopharyngeal(NP) swabs in vial transport medium  Result Value Ref Range Status   SARS Coronavirus 2 by RT PCR NEGATIVE NEGATIVE Final    Comment: (NOTE) SARS-CoV-2 target nucleic acids are NOT DETECTED.  The SARS-CoV-2 RNA is generally detectable in upper respiratory specimens during the acute phase of infection. The lowest concentration of SARS-CoV-2 viral copies this assay can detect is 138 copies/mL. A negative result does not preclude SARS-Cov-2 infection and should not be used as the sole basis for treatment or other patient management decisions. A negative result may occur with  improper specimen collection/handling, submission of specimen other than nasopharyngeal swab, presence of viral mutation(s) within the areas targeted by this assay, and inadequate number of viral copies(<138 copies/mL). A negative result must be combined with clinical observations, patient history, and epidemiological information. The expected result is Negative.  Fact Sheet for Patients:  BloggerCourse.com  Fact Sheet for Healthcare Providers:  SeriousBroker.it  This test is no t yet approved or cleared by the [place] FDA and  has been authorized for detection and/or diagnosis of SARS-CoV-2 by FDA under an Emergency Use Authorization (EUA). This EUA will remain  in effect (meaning this test can be used) for the duration of the COVID-19 declaration under Section 564(b)(1) of the Act, 21 U.S.C.section 360bbb-3(b)(1), unless the authorization is terminated  or revoked sooner.       Influenza A by PCR NEGATIVE  NEGATIVE Final   Influenza B by PCR NEGATIVE NEGATIVE Final    Comment: (NOTE) The Xpert Xpress SARS-CoV-2/FLU/RSV plus assay is intended as an aid in the diagnosis of influenza from Nasopharyngeal swab specimens and should not be used as a sole basis for treatment. Nasal washings and aspirates are unacceptable for Xpert Xpress SARS-CoV-2/FLU/RSV testing.  Fact Sheet for Patients: BloggerCourse.com  Fact Sheet for Healthcare Providers: SeriousBroker.it  This test is not yet approved or cleared by the [place] FDA and has been authorized for detection and/or diagnosis of SARS-CoV-2 by FDA under an Emergency Use Authorization (EUA). This EUA will remain in effect (meaning this test can be used) for the duration of the COVID-19 declaration under Section 564(b)(1) of the Act, 21 U.S.C. section 360bbb-3(b)(1), unless the authorization is terminated or revoked.  Performed at [facility], [address], [place], [place] [address]   Blood culture (routine x 2)     Status: None   Collection Time: 04/17/21  9:00 PM   Specimen: BLOOD RIGHT ARM  Result Value Ref Range Status   Specimen Description BLOOD RIGHT ARM  Final   Special Requests   Final    BOTTLES DRAWN AEROBIC AND ANAEROBIC Blood Culture adequate volume   Culture   Final    NO GROWTH 5 DAYS Performed at [facility], [address] [place], [place] [address]    Report Status 04/22/2021 FINAL  Final  Blood culture (routine x 2)     Status: None   Collection Time: 04/17/21  9:00 PM   Specimen: BLOOD  Result Value Ref Range Status   Specimen Description  BLOOD RIGHT ANTECUBITAL  Final   Special Requests AEROBIC BOTTLE ONLY Blood Culture adequate volume  Final   Culture   Final    NO GROWTH 5 DAYS Performed at [facility], [address] [place], [place] [address]    Report Status 04/22/2021 FINAL  Final  MRSA Next Gen by PCR, Nasal     Status:  None   Collection Time: 04/19/21  1:09 PM   Specimen: Nasal Mucosa; Nasal Swab  Result Value Ref Range Status   MRSA by PCR Next Gen NOT DETECTED NOT DETECTED Final    Comment: (NOTE) The GeneXpert MRSA Assay (FDA approved for NASAL specimens only), is one component of a comprehensive MRSA colonization surveillance program. It is not intended to diagnose MRSA infection nor to guide or monitor treatment for MRSA infections. Test performance is not FDA approved in patients less than 22 years old. Performed at [facility], [address], [place], [place] [address]     RADIOLOGY STUDIES/RESULTS: No results found.   LOS: 7 days   Jeoffrey Massed, MD  [facility]    To contact the attending provider between 7A-7P or the covering provider during after hours 7P-7A, please log into the web site [email or web address] and access using universal [place] password for that web site. If you do not have the password, please call the hospital operator.  04/24/2021, 2:06 PM

## 2021-04-24 NOTE — Progress Notes (Signed)
Pt transported to ct, pt alert and oriented, speaking with staff, no concerns noted.

## 2021-04-25 ENCOUNTER — Inpatient Hospital Stay (HOSPITAL_COMMUNITY): Payer: 59

## 2021-04-25 ENCOUNTER — Other Ambulatory Visit (HOSPITAL_COMMUNITY): Payer: Self-pay

## 2021-04-25 DIAGNOSIS — R0902 Hypoxemia: Secondary | ICD-10-CM

## 2021-04-25 DIAGNOSIS — J449 Chronic obstructive pulmonary disease, unspecified: Secondary | ICD-10-CM | POA: Diagnosis not present

## 2021-04-25 DIAGNOSIS — I5022 Chronic systolic (congestive) heart failure: Secondary | ICD-10-CM

## 2021-04-25 DIAGNOSIS — J189 Pneumonia, unspecified organism: Secondary | ICD-10-CM | POA: Diagnosis not present

## 2021-04-25 MED ORDER — CARVEDILOL 6.25 MG PO TABS
6.2500 mg | ORAL_TABLET | Freq: Two times a day (BID) | ORAL | 1 refills | Status: DC
  Filled 2021-04-25: qty 60, 30d supply, fill #0

## 2021-04-25 MED ORDER — POLYETHYLENE GLYCOL 3350 17 GM/SCOOP PO POWD
17.0000 g | Freq: Every day | ORAL | 1 refills | Status: AC
  Filled 2021-04-25: qty 3350, 197d supply, fill #0

## 2021-04-25 MED ORDER — PANTOPRAZOLE SODIUM 40 MG PO TBEC
40.0000 mg | DELAYED_RELEASE_TABLET | Freq: Every day | ORAL | 1 refills | Status: AC
  Filled 2021-04-25: qty 30, 30d supply, fill #0

## 2021-04-25 MED ORDER — ATORVASTATIN CALCIUM 80 MG PO TABS
80.0000 mg | ORAL_TABLET | Freq: Every day | ORAL | 1 refills | Status: DC
Start: 2021-04-25 — End: 2021-06-25
  Filled 2021-04-25: qty 90, 90d supply, fill #0

## 2021-04-25 MED ORDER — IPRATROPIUM BROMIDE 0.03 % NA SOLN
2.0000 | Freq: Two times a day (BID) | NASAL | 12 refills | Status: DC
  Filled 2021-04-25: qty 30, 30d supply, fill #0

## 2021-04-25 MED ORDER — ALBUTEROL SULFATE (2.5 MG/3ML) 0.083% IN NEBU
2.5000 mg | INHALATION_SOLUTION | Freq: Four times a day (QID) | RESPIRATORY_TRACT | 5 refills | Status: DC | PRN
  Filled 2021-04-25: qty 90, 8d supply, fill #0

## 2021-04-25 MED ORDER — ASPIRIN 81 MG PO TBEC
81.0000 mg | DELAYED_RELEASE_TABLET | Freq: Every day | ORAL | 2 refills | Status: DC
  Filled 2021-04-25: qty 30, 30d supply, fill #0

## 2021-04-25 MED ORDER — FUROSEMIDE 40 MG PO TABS
40.0000 mg | ORAL_TABLET | Freq: Every day | ORAL | 1 refills | Status: DC
  Filled 2021-04-25: qty 90, 90d supply, fill #0

## 2021-04-25 MED ORDER — NITROGLYCERIN 0.4 MG SL SUBL
0.4000 mg | SUBLINGUAL_TABLET | SUBLINGUAL | 2 refills | Status: AC | PRN
  Filled 2021-04-25: qty 25, 8d supply, fill #0

## 2021-04-25 MED ORDER — MIRTAZAPINE 7.5 MG PO TABS
7.5000 mg | ORAL_TABLET | Freq: Every day | ORAL | 0 refills | Status: AC
  Filled 2021-04-25: qty 30, 30d supply, fill #0

## 2021-04-25 MED ORDER — FLUTICASONE-SALMETEROL 250-50 MCG/ACT IN AEPB
1.0000 | INHALATION_SPRAY | Freq: Two times a day (BID) | RESPIRATORY_TRACT | 2 refills | Status: DC
  Filled 2021-04-25: qty 60, 30d supply, fill #0

## 2021-04-25 MED ORDER — ALBUTEROL SULFATE HFA 108 (90 BASE) MCG/ACT IN AERS
2.0000 | INHALATION_SPRAY | Freq: Four times a day (QID) | RESPIRATORY_TRACT | 2 refills | Status: DC | PRN
  Filled 2021-04-25: qty 8.5, 25d supply, fill #0

## 2021-04-25 MED ORDER — STIOLTO RESPIMAT 2.5-2.5 MCG/ACT IN AERS
INHALATION_SPRAY | RESPIRATORY_TRACT | 6 refills | Status: DC
Start: 2021-04-25 — End: 2021-06-25
  Filled 2021-04-25: qty 4, 30d supply, fill #0

## 2021-04-25 MED ORDER — CETIRIZINE HCL 10 MG PO TABS
10.0000 mg | ORAL_TABLET | Freq: Every day | ORAL | 11 refills | Status: AC
  Filled 2021-04-25: qty 30, 30d supply, fill #0

## 2021-04-25 MED ORDER — CLOPIDOGREL BISULFATE 75 MG PO TABS
75.0000 mg | ORAL_TABLET | Freq: Every day | ORAL | 2 refills | Status: DC
Start: 2021-04-25 — End: 2021-06-25
  Filled 2021-04-25: qty 30, 30d supply, fill #0

## 2021-04-25 NOTE — Progress Notes (Signed)
OT Cancellation Note  Patient Details Name: Ray Smith MRN: [id number] DOB: 03-Feb-1946   Cancelled Treatment:    Reason Eval/Treat Not Completed: Patient declined, no reason specified. Attempted OT treatment session as patient to d/c later today. Patient adamantly refusing SNF despite CLOF and therapy recommendation. Nursing staff report patient continues to require external assist with sit to stand transfers. Patient currently lives in a motel alone. OT continues to recommend SNF rehab given unsafe d/c plan and continued need for external assist. OT will continue to follow acutely.   Kallie Edward OTR/L Supplemental OT, Department of rehab services [phone]  Sherron Mapp R H. 04/25/2021, 12:49 PM

## 2021-04-25 NOTE — TOC Transition Note (Signed)
Transition of Care [facility]) - CM/SW Discharge Note   Patient Details  Name: Ray Smith MRN: [id number] Date of Birth: 24-Dec-1945  Transition of Care [facility]) CM/SW Contact:  Harriet Masson, RN Phone Number: 04/25/2021, 12:45 PM   Clinical Narrative:   Patient stable for discharge. HH PT/OT ordered but patient refuses therapy services.   DME orders for 02, walker, 3&1 and crutches. Patient agrees to use in house provider for DME needs. Equipment delivered to the room.  Patient needs transportation home to [address] [place], [place], [place] [address]. Will call [facility] transport when patient is ready for discharge.  TOC to deliver medicines to the room prior to discharge.    Final next level of care: Home/Self Care Barriers to Discharge: Barriers Resolved   Patient Goals and CMS Choice Patient states their goals for this hospitalization and ongoing recovery are:: return home CMS Medicare.gov Compare Post Acute Care list provided to:: Patient Choice offered to / list presented to : Patient  Discharge Placement             Home with DME          Discharge Plan and Services In-house Referral: Clinical Social Work   Post Acute Care Choice: Durable Medical Equipment          DME Arranged: 3-N-1, Gae Bon, Oxygen DME Agency: [employer] Date DME Agency Contacted: 04/25/21 Time DME Agency Contacted: 1100 Representative spoke with at DME Agency: Marianna Fuss HH Arranged: Refused HH (Patient refused HH-PT/OT)          Social Determinants of Health (SDOH) Interventions     Readmission Risk Interventions Readmission Risk Prevention Plan 04/20/2021 11/17/2020  Transportation Screening Complete Complete  PCP or Specialist Appt within 3-5 Days - Complete  HRI or Home Care Consult - Complete  Palliative Care Screening - Not Applicable  Medication Review (RN Care Manager) Complete -  PCP or Specialist appointment within 3-5 days of discharge Complete -  HRI or Home  Care Consult Complete -  SW Recovery Care/Counseling Consult Complete -  Palliative Care Screening Not Applicable -  Skilled Nursing Facility Patient Refused -  Some recent data might be hidden

## 2021-04-25 NOTE — Progress Notes (Signed)
Pt was to be discharged today however this was placed on hold for a psych consult. PIV and tele was removed for DC and pt refuses for it to be replaced. When RN went over dc instructions patient asked about getting back his "brass basket with my meds". RN inquired as to where the basket was and patient stated either the RN or doctor had it. Pt stated that it was taken from him on admission to record his home meds. RN did not see this info documented under admission belongings or home meds. RN checked the shadow chart and called pharmacy and they do not have the meds down there. It was neither in the pt belonging bin on the unit or down in the ED. Pt stated "I am not leaving until my basket is found". Pt reassured that RN is doing everything possible to try to locate these belongings. MD and Charge RN made aware.

## 2021-04-25 NOTE — TOC Progression Note (Signed)
Transition of Care [facility]) - Progression Note    Patient Details  Name: Ray Smith MRN: [id number] Date of Birth: 22-Jun-1946  Transition [facility] Care [facility]) CM/SW Contact  Ray Latin, LCSW Phone Number: 04/25/2021, 3:19 PM  Clinical Narrative:    CSW received call from RN that APS had called her stating patient is not safe to return home but did not leave contact info. CSW contacted [facility] APS and on call worker will have patient's APS worker contact CSW back.  CSW received call from Ray Smith, with [place] [facility]PS [phone]). She stated that they were called in to assess patient due to self-neglect and poor living conditions at his apartment complex ([employer]) and that his step-daughter Ray Smith is concerned [phone]). CSW explained that hospital is patient is currently refusing SNF and home health and patient is oriented x4 to make his own decisions and is requesting to return home with his dog. She stated that if patient has capacity there is nothing they can do to make him go to SNF as the courts will not grant guardianship. Per MD, will get a capacity eval with Psychiatry to be sure.      Expected Discharge Plan: Home/Self Care Barriers to Discharge: Barriers Resolved  Expected Discharge Plan and Services Expected Discharge Plan: Home/Self Care In-house Referral: Clinical Social Work   Post Acute Care Choice: Durable Medical Equipment Living arrangements for the past 2 months: Hotel/Motel Expected Discharge Date: 04/25/21               DME Arranged: 3-N-1, Ray Smith, Oxygen DME Agency: [employer] Date DME Agency Contacted: 04/25/21 Time DME Agency Contacted: 1100 Representative spoke with at DME Agency: Ray Smith HH Arranged: Refused HH (Patient refused HH-PT/OT)           Social Determinants of Health (SDOH) Interventions    Readmission Risk Interventions Readmission Risk Prevention Plan 04/25/2021 04/25/2021  04/20/2021  Transportation Screening - Complete Complete  PCP or Specialist Appt within 3-5 Days - - -  HRI or Home Care Consult - - -  Palliative Care Screening - - -  Medication Review (RN Care Manager) Complete Complete Complete  PCP or Specialist appointment within 3-5 days of discharge Complete Complete Complete  HRI or Home Care Consult Patient refused - Complete  SW Recovery Care/Counseling Consult Patient refused - Complete  Palliative Care Screening Not Applicable - Not Applicable  Skilled Nursing Facility Patient Refused - Patient Refused  Some recent data might be hidden

## 2021-04-25 NOTE — Discharge Summary (Addendum)
PATIENT DETAILS Name: Ray Smith Age: 75 y.o. Sex: male Date of Birth: 12/13/1945 MRN: [id number]. Admitting Physician: Eben Burow, MD [id number], Nelda Bucks, NP  Admit Date: 04/17/2021 Discharge date: 04/26/2021  Recommendations for Outpatient at discretion:  Follow up with PCP in 1-2 weeks Please obtain CMP/CBC in one week 3.  Incidental finding on CT chest-descending aortic aneurysm-needs repeat imaging at discretion by PCP.  Admitted From:  Home  Disposition: Home (refused SNF)   Home Health: Yes (however it may not be possible to arrange as patient lives in a long-term motel)  Equipment/Devices: Resume as previous.  Discharge Condition: Stable  CODE STATUS: FULL CODE  Diet recommendation:  Diet Order             Diet - low sodium heart healthy           Diet Heart Room service appropriate? Yes; Fluid consistency: Thin  Diet effective now                    Brief Summary: Patient is a 75 y.o. male with history of HFrEF, COPD-on home O2, CAD, HTN, HLD-who presented with shortness of breath-patient was found to have acute on chronic hypoxic respiratory failure due to community-acquired pneumonia.  See below for further details.  Pertinent Labs/Radiology: 10/23>>Blood culture: No growth 10/23>>Urine Culture: E. coli/Proteus (both<100,000 colonies)   10/28>> CT chest: Bilateral small/moderate pleural effusions with atelectasis/infiltrate.  Emphysema.  4 cm saccular aneurysm at the distal descending thoracic aorta. 10/26>>CXR: Bilateral patchy airspace opacities. 05/11>> Echo: EF 25-30%.  Brief Hospital Course: Acute on chronic hypoxic respiratory failure due to community-acquired pneumonia: Much improved-has completed a course of antibiotics.  Still continues to have some mild leukocytosis-evaluated by PCCM for possible thoracocentesis given the fact that he has bilateral pleural effusion.  Effusion is felt to be transudative-Per PCCM-does not  require thoracocentesis-as very low threshold to suspect that this is a exudate/parapneumonic effusion.  All cultures were negative.  Patient to continue his home O2 as previous-he is supposed to use 24/7-but by choice he only uses at night.  AKI on CKD stage IIIa: AKI hemodynamically mediated-improving with supportive care.   Asymptomatic bacteriuria: Not felt to have UTI-he has no symptoms-urine cultures are of no clinical relevance.   COPD with chronic hypoxic respiratory failure (on nocturnal O2): Not in exacerbation-continue bronchodilators.   HFrEF: Not in exacerbation-volume status stable-continue Lasix   CAD: No anginal symptoms-continue Plavix/Coreg/statin   HTN: BP stable-continue Coreg/Lasix   Hypophosphatemia: Repleted.   Debility/deconditioning: Appears weak-agree with PT-likely will benefit from SNF-however patient refusing-and wants to go back to his prior living situation (lives in a long-term motel).  Per patient-he has several thousand dollars worth of valuables in his room in the motel.  He is completely awake and alert-and claims that previously he used to live in a [place] that he sold and now uses the money to live in an long-term motel.  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  Note-he was evaluated by psychiatry prior to his discharge on 11/1-felt to have capacity to refuse SNF.   Homelessness/Social: Lives in a long-term motel-see above  BMI: Estimated body mass index is 14.23 kg/m as calculated from the following:   Height as of 02/14/21: 5\' 10"  (1.778 m).   Weight as of this encounter: 45 kg.   RN pressure injury documentation: Pressure Injury 04/19/21 Sacrum Medial Unstageable -  Full thickness tissue loss in which the base of the injury is covered by slough (yellow, tan, gray, green or brown) and/or eschar (tan, brown or black) in the wound bed. (Active)   04/19/21 1531  Location: Sacrum  Location Orientation: Medial  Staging: Unstageable - Full thickness tissue loss in which the base of the injury is covered by slough (yellow, tan, gray, green or brown) and/or eschar (tan, brown or black) in the wound bed.  Wound Description (Comments):   Present on Admission: Yes    Procedures None  Discharge Diagnoses:  Principal Problem:   Atypical pneumonia Active Problems:   COPD (chronic obstructive pulmonary disease) (HCC)   Hypertension   Coronary artery disease   CKD (chronic kidney disease) stage 3, GFR 30-59 ml/min (HCC)   Hypoxemia   HFrEF (heart failure with reduced ejection fraction) (HCC)   Hypokalemia   Discharge Instructions:  Activity:  As tolerated with Full fall precautions use walker/cane & assistance as needed  Discharge Instructions     Call MD for:  difficulty breathing, headache or visual disturbances   Complete by: As directed    Diet - low sodium heart healthy   Complete by: As directed    Discharge instructions   Complete by: As directed    Follow with Primary MD  Ngetich, Dinah C, NP in 1-2 weeks  Please get a complete blood count and chemistry panel checked by your Primary MD at your next visit, and again as instructed by your Primary MD.  Get Medicines reviewed and adjusted: Please take all your medications with you for your next visit with your Primary MD  Laboratory/radiological data: Please request your Primary MD to go over all hospital tests and procedure/radiological results at the follow up, please ask your Primary MD to get all Hospital records sent to his/her office.  In some cases, they will be blood work, cultures and biopsy results pending at the time of your discharge. Please request that your primary care M.D. follows up on these results.  Also Note the following: If you experience worsening of your admission symptoms, develop shortness of breath, life threatening emergency, suicidal or  homicidal thoughts you must seek medical attention immediately by calling 911 or calling your MD immediately  if symptoms less severe.  You must read complete instructions/literature along with all the possible adverse reactions/side effects for all the Medicines you take and that have been prescribed to you. Take any new Medicines after you have completely understood and accpet all the possible adverse reactions/side effects.   Do not drive when taking Pain medications or sleeping medications (Benzodaizepines)  Do not take more than prescribed Pain, Sleep and Anxiety Medications. It is not advisable to combine anxiety,sleep and pain medications without talking with your primary care practitioner  Special Instructions: If you have smoked or chewed Tobacco  in the last 2 yrs please stop smoking, stop any regular Alcohol  and or any Recreational drug use.  Wear Seat belts while driving.  Please note: You were cared for by a hospitalist during your hospital stay. Once you are discharged, your primary care physician will handle any further medical issues. Please note that NO REFILLS for any discharge medications will be authorized once you are discharged, as it is imperative that you return to your primary care physician (or establish a relationship with a primary care physician if you do not have one) for your post hospital discharge needs so that they can reassess your need for medications and  monitor your lab values.   1.)  Use oxygen 24/7  2.)  Incidental finding-you have a descending aortic aneurysm seen on CT chest, please ask your primary care practitioner to repeat yearly CT scans.   Increase activity slowly   Complete by: As directed    No dressing needed   Complete by: As directed       Allergies as of 04/26/2021       Reactions   Tramadol Anaphylaxis   Flexeril [cyclobenzaprine] Other (See Comments)   Per patient "it made my heart stop"   Ibuprofen Other (See Comments)   Overuse  damages pt's kidneys   Ibuprofen Nausea Only, Other (See Comments)   Reaction not recalled- perhaps made his stomach hurt   Lactose Intolerance (gi) Other (See Comments)   Digestive issues        Medication List     STOP taking these medications    dapagliflozin propanediol 10 MG Tabs tablet Commonly known as: FARXIGA       TAKE these medications    albuterol 108 (90 Base) MCG/ACT inhaler Commonly known as: VENTOLIN HFA Inhale 2 puffs into the lungs every 6 (six) hours as needed for wheezing or shortness of breath.   albuterol (2.5 MG/3ML) 0.083% nebulizer solution Commonly known as: PROVENTIL Use 1 vial (2.5 mg total) by nebulization every 6 (six) hours as needed for wheezing or shortness of breath.   Aspirin Low Dose 81 MG EC tablet Generic drug: aspirin Take 1 tablet (81 mg total) by mouth daily.   atorvastatin 80 MG tablet Commonly known as: LIPITOR Take 1 tablet (80 mg total) by mouth daily at 6 PM.   carvedilol 6.25 MG tablet Commonly known as: COREG Take 0.5 tablets (3.125 mg total) by mouth 2 (two) times daily with a meal. What changed: how much to take   cetirizine 10 MG tablet Commonly known as: ZYRTEC Take 1 tablet (10 mg total) by mouth daily.   clopidogrel 75 MG tablet Commonly known as: PLAVIX Take 1 tablet (75 mg total) by mouth daily.   fluticasone-salmeterol 250-50 MCG/ACT Aepb Commonly known as: ADVAIR Inhale 1 puff into the lungs in the morning and at bedtime.   furosemide 40 MG tablet Commonly known as: LASIX Take 1 tablet (40 mg total) by mouth daily.   ipratropium 0.03 % nasal spray Commonly known as: ATROVENT Place 2 sprays into both nostrils every 12 (twelve) hours.   mirtazapine 7.5 MG tablet Commonly known as: REMERON Take 1 tablet (7.5 mg total) by mouth at bedtime.   nitroGLYCERIN 0.4 MG SL tablet Commonly known as: NITROSTAT Place 1 tablet (0.4 mg total) under the tongue every 5 (five) minutes x 3 doses as needed for  chest pain.   OXYGEN Inhale 5 L/min into the lungs at bedtime as needed (for shortness of breath).   pantoprazole 40 MG tablet Commonly known as: PROTONIX Take 1 tablet (40 mg total) by mouth at bedtime.   polyethylene glycol powder 17 GM/SCOOP powder Commonly known as: GLYCOLAX/MIRALAX Take 17 g by mouth daily.   Stiolto Respimat 2.5-2.5 MCG/ACT Aers Generic drug: Tiotropium Bromide-Olodaterol INHALE 2 PUFFS BY MOUTH INTO THE LUNGS DAILY What changed:  how much to take how to take this when to take this additional instructions               Durable Medical Equipment  (From admission, onward)           Start     Ordered  04/25/21 1127  For home use only DME Crutches  Once        04/25/21 1126   04/25/21 1126  For home use only DME 3 n 1  Once        04/25/21 1126   04/22/21 1431  For home use only DME oxygen  Once       Question Answer Comment  Length of Need 12 Months   Mode or (Route) Nasal cannula   Liters per Minute 2   Frequency Continuous (stationary and portable oxygen unit needed)   Oxygen conserving device Yes   Oxygen delivery system Gas      04/22/21 1430   04/21/21 1507  For home use only DME Walker rolling  Once       Question Answer Comment  Walker: With [place]   Patient needs a walker to treat with the following condition Weakness      04/21/21 1506              Discharge Care Instructions  (From admission, onward)           Start     Ordered   04/25/21 0000  No dressing needed        04/25/21 1112            Follow-up Information     Ngetich, Dinah C, NP. Schedule an appointment as soon as possible for a visit in 5 day(s).   Specialty: Family Medicine Contact information: [address] [place] [address] [phone]         Jerline Pain, MD. Schedule an appointment as soon as possible for a visit in 1 month(s).   Specialty: Cardiology Contact information: [id number] [place] [address] [phone]                Allergies  Allergen Reactions   Tramadol Anaphylaxis   Flexeril [Cyclobenzaprine] Other (See Comments)    Per patient "it made my heart stop"   Ibuprofen Other (See Comments)    Overuse damages pt's kidneys    Ibuprofen Nausea Only and Other (See Comments)    Reaction not recalled- perhaps made his stomach hurt   Lactose Intolerance (Gi) Other (See Comments)    Digestive issues      Consultations:  PCCM   Other Procedures/Studies: DG Chest 2 View  Result Date: 04/25/2021 CLINICAL DATA:  Pleural effusion EXAM: CHEST - 2 VIEW COMPARISON:  Previous studies including the examination of 04/20/2021 FINDINGS: Transverse diameter of heart is increased. Thoracic aorta is tortuous and ectatic. Low position of diaphragms suggests COPD. There are no signs of alveolar pulmonary edema. There are linear densities in medial left lower lung fields with no significant change. There are patchy infiltrates and increased density in right parahilar region and right lower lung fields. There is blunting of right lateral costophrenic angle. There is no pneumothorax. IMPRESSION: Increased density in right parahilar region and right lower lung fields suggests pleural effusion and underlying atelectasis/pneumonitis. Subsegmental atelectasis is seen in the medial left lower lung fields. Electronically Signed   By: Elmer Picker M.D.   On: 04/25/2021 09:03   CT CHEST WO CONTRAST  Result Date: 04/22/2021 CLINICAL DATA:  Pneumonia, effusion EXAM: CT CHEST WITHOUT CONTRAST TECHNIQUE: Multidetector CT imaging of the chest was performed following the standard protocol without IV contrast. COMPARISON:  Chest x-ray 04/20/2021, CT chest 10/23/2013 FINDINGS: Cardiovascular: Heart is mildly enlarged. No pericardial effusion identified. Extensive  coronary artery calcifications. Main pulmonary artery is normal caliber. There is an approximately 3.4 x 2.6 x 4  cm saccular aneurysm at the distal descending thoracic aorta on the right. Severe atherosclerotic plaques throughout the thoracic aorta. Mediastinum/Nodes: No bulky lymphadenopathy identified. Esophagus is mildly distended and fluid-filled. Lungs/Pleura: Small to moderate bilateral pleural effusions with associated compressive atelectasis and irregular interstitial and airspace densities in the adjacent lower lobes and posterior aspect of the right upper lobe. Moderate emphysematous changes of the lungs. Biapical pleural thickening. Small foci of mucous plugging within bilateral lower lobe bronchi. No pneumothorax. Upper Abdomen: No acute process identified in the upper abdomen. Multiple hepatic hypodense likely cysts. Hypodense likely cysts in left kidney. Left renal atrophy. Musculoskeletal: No suspicious bony lesions identified. IMPRESSION: 1. Bilateral small to moderate pleural effusions with associated atelectasis/infiltrates, right greater than left. 2. Emphysema.  Scattered small mucous plugging. 3. Mild cardiomegaly. Severe coronary artery disease and atherosclerotic disease. 4. Saccular aneurysm at the distal descending thoracic aorta measuring up to 4 cm. New since previous study, follow-up recommended. 5. Other findings as described. Aortic aneurysm NOS (ICD10-I71.9). Electronically Signed   By: Ofilia Neas M.D.   On: 04/22/2021 11:47   DG CHEST PORT 1 VIEW  Result Date: 04/20/2021 CLINICAL DATA:  Shortness of breath. EXAM: PORTABLE CHEST 1 VIEW COMPARISON:  Chest radiograph 04/19/2021 FINDINGS: Upper lobe predominant emphysematous changes. Again noted are interstitial airspace opacities in the right mid and lower lungs with improved aeration in the right lung base compared to yesterday's exam. Patchy airspace opacities noted at the right hilum and medial left lower lobe. Small right pleural effusion has also improved with trace residual fluid. No left pleural effusion. No pneumothorax. Stable  mildly enlarged cardiac silhouette. Aortic calcifications. No acute osseous abnormality. IMPRESSION: Bilateral patchy airspace opacities and interstitial opacities throughout the right mid and lower lungs, concerning for multifocal bronchopneumonia. Trace right pleural effusion, improved compared to yesterday's exam. Aortic Atherosclerosis (ICD10-I70.0) and Emphysema (ICD10-J43.9). Electronically Signed   By: Ileana Roup M.D.   On: 04/20/2021 08:52   DG CHEST PORT 1 VIEW  Result Date: 04/19/2021 CLINICAL DATA:  75 year old male with history of shortness of breath. EXAM: PORTABLE CHEST 1 VIEW COMPARISON:  Chest x-ray 04/17/2021. FINDINGS: Severe emphysematous changes are again noted. Diffuse peribronchial cuffing. Patchy multifocal interstitial and airspace disease is noted in the lungs bilaterally, most severe throughout the right mid to lower lung. Small bilateral pleural effusions (right greater than left). No pneumothorax. No evidence of pulmonary edema. Heart size is mildly enlarged. Upper mediastinal contours are within normal limits. Atherosclerotic calcifications in the thoracic aorta. IMPRESSION: 1. The appearance of the chest is concerning for multilobar bronchopneumonia, most severe throughout the right mid to lower lung. 2. Small bilateral pleural effusions (right greater than left). 3. Mild cardiomegaly. 4. Aortic atherosclerosis. Electronically Signed   By: Vinnie Langton M.D.   On: 04/19/2021 06:40   DG Chest Port 1 View  Result Date: 04/17/2021 CLINICAL DATA:  Questionable sepsis.  Evaluate for abnormality. EXAM: PORTABLE CHEST 1 VIEW COMPARISON:  Chest radiograph dated 12/12/2020. FINDINGS: Background of emphysema. Diffuse reticular interstitial densities throughout the lungs, new since the prior radiograph and concerning for atypical infiltrate. No consolidative changes. There is no pleural effusion pneumothorax. Stable cardiomediastinal silhouette. The aorta is tortuous. No acute  osseous pathology. IMPRESSION: Diffuse reticular interstitial densities throughout the lungs concerning for atypical infiltrate. Electronically Signed   By: Anner Crete M.D.   On: 04/17/2021 20:58  TODAY-DAY OF DISCHARGE:  Subjective:   Jadyn Omdahl today has no headache,no chest abdominal pain,no new weakness tingling or numbness, feels much better wants to go home today.   Objective:   Blood pressure 108/79, pulse 90, temperature 97.6 F (36.4 C), temperature source Oral, resp. rate 18, weight 45 kg, SpO2 100 %.  Intake/Output Summary (Last 24 hours) at 04/26/2021 1031 Last data filed at 04/26/2021 0346 Gross per 24 hour  Intake 240 ml  Output 450 ml  Net -210 ml   Filed Weights   04/22/21 0500 04/24/21 0500 04/26/21 0351  Weight: 45 kg 45.4 kg 45 kg    Exam: Awake Alert, Oriented *3, No new F.N deficits, Normal affect [place].AT,PERRAL Supple Neck,No JVD, No cervical lymphadenopathy appriciated.  Symmetrical Chest wall movement, Good air movement bilaterally, CTAB RRR,No Gallops,Rubs or new Murmurs, No Parasternal Heave +ve B.Sounds, Abd Soft, Non tender, No organomegaly appriciated, No rebound -guarding or rigidity. No Cyanosis, Clubbing or edema, No new Rash or bruise   PERTINENT RADIOLOGIC STUDIES: DG Chest 2 View  Result Date: 04/25/2021 CLINICAL DATA:  Pleural effusion EXAM: CHEST - 2 VIEW COMPARISON:  Previous studies including the examination of 04/20/2021 FINDINGS: Transverse diameter of heart is increased. Thoracic aorta is tortuous and ectatic. Low position of diaphragms suggests COPD. There are no signs of alveolar pulmonary edema. There are linear densities in medial left lower lung fields with no significant change. There are patchy infiltrates and increased density in right parahilar region and right lower lung fields. There is blunting of right lateral costophrenic angle. There is no pneumothorax. IMPRESSION: Increased density in right parahilar region and  right lower lung fields suggests pleural effusion and underlying atelectasis/pneumonitis. Subsegmental atelectasis is seen in the medial left lower lung fields. Electronically Signed   By: Elmer Picker M.D.   On: 04/25/2021 09:03     PERTINENT LAB RESULTS: CBC: Recent Labs    04/24/21 0806  WBC 14.5*  HGB 11.7*  HCT 36.9*  PLT 200   CMET CMP     Component Value Date/Time   NA 137 04/24/2021 0806   K 3.7 04/24/2021 0806   CL 99 04/24/2021 0806   CO2 28 04/24/2021 0806   GLUCOSE 118 (H) 04/24/2021 0806   BUN 30 (H) 04/24/2021 0806   CREATININE 1.19 04/24/2021 0806   CALCIUM 7.9 (L) 04/24/2021 0806   PROT 5.5 (L) 04/22/2021 0116   ALBUMIN 1.9 (L) 04/22/2021 0116   AST 33 04/22/2021 0116   ALT 18 04/22/2021 0116   ALKPHOS 91 04/22/2021 0116   BILITOT 0.5 04/22/2021 0116   GFRNONAA >60 04/24/2021 0806   GFRAA 60 (L) 01/26/2020 2139    GFR Estimated Creatinine Clearance: 33.6 mL/min (by C-G formula based on SCr of 1.19 mg/dL). No results for input(s): LIPASE, AMYLASE in the last 72 hours. No results for input(s): CKTOTAL, CKMB, CKMBINDEX, TROPONINI in the last 72 hours. Invalid input(s): POCBNP No results for input(s): DDIMER in the last 72 hours. No results for input(s): HGBA1C in the last 72 hours. No results for input(s): CHOL, HDL, LDLCALC, TRIG, CHOLHDL, LDLDIRECT in the last 72 hours. No results for input(s): TSH, T4TOTAL, T3FREE, THYROIDAB in the last 72 hours.  Invalid input(s): FREET3 No results for input(s): VITAMINB12, FOLATE, FERRITIN, TIBC, IRON, RETICCTPCT in the last 72 hours. Coags: No results for input(s): INR in the last 72 hours.  Invalid input(s): PT Microbiology: Recent Results (from the past 240 hour(s))  Urine Culture     Status: Abnormal  Collection Time: 04/17/21  8:30 PM   Specimen: In/Out Cath Urine  Result Value Ref Range Status   Specimen Description IN/OUT CATH URINE  Final   Special Requests   Final    NONE Performed at  [facility], [place] [address] [place], [place] [address]    Culture (A)  Final    20,000 COLONIES/mL ESCHERICHIA COLI 10,000 COLONIES/mL PROTEUS MIRABILIS    Report Status 04/20/2021 FINAL  Final   Organism ID, Bacteria ESCHERICHIA COLI (A)  Final   Organism ID, Bacteria PROTEUS MIRABILIS (A)  Final      Susceptibility   Escherichia coli - MIC*    AMPICILLIN <=2 SENSITIVE Sensitive     CEFAZOLIN <=4 SENSITIVE Sensitive     CEFEPIME <=0.12 SENSITIVE Sensitive     CEFTRIAXONE <=0.25 SENSITIVE Sensitive     CIPROFLOXACIN <=0.25 SENSITIVE Sensitive     GENTAMICIN <=1 SENSITIVE Sensitive     IMIPENEM <=0.25 SENSITIVE Sensitive     NITROFURANTOIN <=16 SENSITIVE Sensitive     TRIMETH/SULFA <=20 SENSITIVE Sensitive     AMPICILLIN/SULBACTAM <=2 SENSITIVE Sensitive     PIP/TAZO <=4 SENSITIVE Sensitive     * 20,000 COLONIES/mL ESCHERICHIA COLI   Proteus mirabilis - MIC*    AMPICILLIN <=2 SENSITIVE Sensitive     CEFAZOLIN <=4 SENSITIVE Sensitive     CEFEPIME <=0.12 SENSITIVE Sensitive     CEFTRIAXONE <=0.25 SENSITIVE Sensitive     CIPROFLOXACIN <=0.25 SENSITIVE Sensitive     GENTAMICIN <=1 SENSITIVE Sensitive     IMIPENEM 2 SENSITIVE Sensitive     NITROFURANTOIN 128 RESISTANT Resistant     TRIMETH/SULFA <=20 SENSITIVE Sensitive     AMPICILLIN/SULBACTAM <=2 SENSITIVE Sensitive     PIP/TAZO <=4 SENSITIVE Sensitive     * 10,000 COLONIES/mL PROTEUS MIRABILIS  Resp Panel by RT-PCR (Flu A&B, Covid) Nasopharyngeal Swab     Status: None   Collection Time: 04/17/21  8:31 PM   Specimen: Nasopharyngeal Swab; Nasopharyngeal(NP) swabs in vial transport medium  Result Value Ref Range Status   SARS Coronavirus 2 by RT PCR NEGATIVE NEGATIVE Final    Comment: (NOTE) SARS-CoV-2 target nucleic acids are NOT DETECTED.  The SARS-CoV-2 RNA is generally detectable in upper respiratory specimens during the acute phase of infection. The lowest concentration of SARS-CoV-2 viral copies this assay can  detect is 138 copies/mL. A negative result does not preclude SARS-Cov-2 infection and should not be used as the sole basis for treatment or other patient management decisions. A negative result may occur with  improper specimen collection/handling, submission of specimen other than nasopharyngeal swab, presence of viral mutation(s) within the areas targeted by this assay, and inadequate number of viral copies(<138 copies/mL). A negative result must be combined with clinical observations, patient history, and epidemiological information. The expected result is Negative.  Fact Sheet for Patients:  EntrepreneurPulse.com.au  Fact Sheet for Healthcare Providers:  IncredibleEmployment.be  This test is no t yet approved or cleared by the [place] FDA and  has been authorized for detection and/or diagnosis of SARS-CoV-2 by FDA under an Emergency Use Authorization (EUA). This EUA will remain  in effect (meaning this test can be used) for the duration of the COVID-19 declaration under Section 564(b)(1) of the Act, 21 U.S.C.section 360bbb-3(b)(1), unless the authorization is terminated  or revoked sooner.       Influenza A by PCR NEGATIVE NEGATIVE Final   Influenza B by PCR NEGATIVE NEGATIVE Final    Comment: (NOTE) The Xpert Xpress  SARS-CoV-2/FLU/RSV plus assay is intended as an aid in the diagnosis of influenza from Nasopharyngeal swab specimens and should not be used as a sole basis for treatment. Nasal washings and aspirates are unacceptable for Xpert Xpress SARS-CoV-2/FLU/RSV testing.  Fact Sheet for Patients: BloggerCourse.com  Fact Sheet for Healthcare Providers: SeriousBroker.it  This test is not yet approved or cleared by the [place] FDA and has been authorized for detection and/or diagnosis of SARS-CoV-2 by FDA under an Emergency Use Authorization (EUA). This EUA will remain in  effect (meaning this test can be used) for the duration of the COVID-19 declaration under Section 564(b)(1) of the Act, 21 U.S.C. section 360bbb-3(b)(1), unless the authorization is terminated or revoked.  Performed at [facility], [address], [place], [place] [address]   Blood culture (routine x 2)     Status: None   Collection Time: 04/17/21  9:00 PM   Specimen: BLOOD RIGHT ARM  Result Value Ref Range Status   Specimen Description BLOOD RIGHT ARM  Final   Special Requests   Final    BOTTLES DRAWN AEROBIC AND ANAEROBIC Blood Culture adequate volume   Culture   Final    NO GROWTH 5 DAYS Performed at [facility], [address], [place], [place] [address]    Report Status 04/22/2021 FINAL  Final  Blood culture (routine x 2)     Status: None   Collection Time: 04/17/21  9:00 PM   Specimen: BLOOD  Result Value Ref Range Status   Specimen Description BLOOD RIGHT ANTECUBITAL  Final   Special Requests AEROBIC BOTTLE ONLY Blood Culture adequate volume  Final   Culture   Final    NO GROWTH 5 DAYS Performed at [facility], [address] [place], [place] [address]    Report Status 04/22/2021 FINAL  Final  MRSA Next Gen by PCR, Nasal     Status: None   Collection Time: 04/19/21  1:09 PM   Specimen: Nasal Mucosa; Nasal Swab  Result Value Ref Range Status   MRSA by PCR Next Gen NOT DETECTED NOT DETECTED Final    Comment: (NOTE) The GeneXpert MRSA Assay (FDA approved for NASAL specimens only), is one component of a comprehensive MRSA colonization surveillance program. It is not intended to diagnose MRSA infection nor to guide or monitor treatment for MRSA infections. Test performance is not FDA approved in patients less than 19 years old. Performed at [facility], [address], [place], [place] [address]     FURTHER DISCHARGE INSTRUCTIONS:  Get Medicines reviewed and adjusted: Please take all your medications with you for your next visit with  your Primary MD  Laboratory/radiological data: Please request your Primary MD to go over all hospital tests and procedure/radiological results at the follow up, please ask your Primary MD to get all Hospital records sent to his/her office.  In some cases, they will be blood work, cultures and biopsy results pending at the time of your discharge. Please request that your primary care M.D. goes through all the records of your hospital data and follows up on these results.  Also Note the following: If you experience worsening of your admission symptoms, develop shortness of breath, life threatening emergency, suicidal or homicidal thoughts you must seek medical attention immediately by calling 911 or calling your MD immediately  if symptoms less severe.  You must read complete instructions/literature along with all the possible adverse reactions/side effects for all the Medicines you  take and that have been prescribed to you. Take any new Medicines after you have completely understood and accpet all the possible adverse reactions/side effects.   Do not drive when taking Pain medications or sleeping medications (Benzodaizepines)  Do not take more than prescribed Pain, Sleep and Anxiety Medications. It is not advisable to combine anxiety,sleep and pain medications without talking with your primary care practitioner  Special Instructions: If you have smoked or chewed Tobacco  in the last 2 yrs please stop smoking, stop any regular Alcohol  and or any Recreational drug use.  Wear Seat belts while driving.  Please note: You were cared for by a hospitalist during your hospital stay. Once you are discharged, your primary care physician will handle any further medical issues. Please note that NO REFILLS for any discharge medications will be authorized once you are discharged, as it is imperative that you return to your primary care physician (or establish a relationship with a primary care physician if you  do not have one) for your post hospital discharge needs so that they can reassess your need for medications and monitor your lab values.  Total Time spent coordinating discharge including counseling, education and face to face time equals 35 minutes.  SignedOren Binet 04/26/2021 10:31 AM

## 2021-04-26 ENCOUNTER — Other Ambulatory Visit (HOSPITAL_COMMUNITY): Payer: Self-pay

## 2021-04-26 DIAGNOSIS — R451 Restlessness and agitation: Secondary | ICD-10-CM

## 2021-04-26 MED ORDER — CARVEDILOL 3.125 MG PO TABS
3.1250 mg | ORAL_TABLET | Freq: Two times a day (BID) | ORAL | Status: DC
Start: 2021-04-26 — End: 2021-04-26
  Administered 2021-04-26: 3.125 mg via ORAL
  Filled 2021-04-26: qty 1

## 2021-04-26 MED ORDER — CARVEDILOL 6.25 MG PO TABS
3.1250 mg | ORAL_TABLET | Freq: Two times a day (BID) | ORAL | 1 refills | Status: DC
Start: 2021-04-26 — End: 2021-06-25
  Filled 2021-04-26: qty 90, 90d supply, fill #0

## 2021-04-26 NOTE — Progress Notes (Signed)
Patient provided with discharge education and materials, verbalized understanding. Patient verbalized concern for a medication container that he states thought he brought to the hospital with him. This RN contacted pharmacy, followed up with charge RN and was unable to locate said belongings. Discussed with patient that no belongings were documented on admission and there is no record of this being at the hospital with patient. Patient stated he will not wait on [place] to continue to try locating the belonging and wishes to leave. Charge RN aware.

## 2021-04-26 NOTE — Progress Notes (Signed)
Seen and examined Breathing better-anxious to leave the hospital.  Blood pressure 108/79, pulse 90, temperature 97.6 F (36.4 C), temperature source Oral, resp. rate 18, weight 45 kg, SpO2 100 %.   Evaluated by psychiatry earlier this morning-does have capacity to refuse SNF.  Being discharged home at his own request.  See discharge summary done on yesterday for further details.

## 2021-04-26 NOTE — TOC Transition Note (Signed)
Transition of Care [facility]) - CM/SW Discharge Note   Patient Details  Name: Ray Smith MRN: [id number] Date of Birth: 09-Jun-1946  Transition of Care [facility]) CM/SW Contact:  Harriet Masson, RN Phone Number: 04/26/2021, 11:06 AM   Clinical Narrative:    Patient stable for discharge. [facility] called. Patient received all of his DME yesterday.   Final next level of care: Home/Self Care Barriers to Discharge: Barriers Resolved   Patient Goals and CMS Choice Patient states their goals for this hospitalization and ongoing recovery are:: return home CMS Medicare.gov Compare Post Acute Care list provided to:: Patient Choice offered to / list presented to : Patient  Discharge Placement               Home         Discharge Plan and Services In-house Referral: Clinical Social Work   Post Acute Care Choice: Durable Medical Equipment          DME Arranged: 3-N-1, Gae Bon, Oxygen DME Agency: [employer] Date DME Agency Contacted: 04/25/21 Time DME Agency Contacted: 1100 Representative spoke with at DME Agency: Marianna Fuss HH Arranged: Refused HH (Patient refused HH-PT/OT)          Social Determinants of Health (SDOH) Interventions     Readmission Risk Interventions Readmission Risk Prevention Plan 04/25/2021 04/25/2021 04/20/2021  Transportation Screening - Complete Complete  PCP or Specialist Appt within 3-5 Days - - -  HRI or Home Care Consult - - -  Palliative Care Screening - - -  Medication Review (RN Care Manager) Complete Complete Complete  PCP or Specialist appointment within 3-5 days of discharge Complete Complete Complete  HRI or Home Care Consult Patient refused - Complete  SW Recovery Care/Counseling Consult Patient refused - Complete  Palliative Care Screening Not Applicable - Not Applicable  Skilled Nursing Facility Patient Refused - Patient Refused  Some recent data might be hidden

## 2021-04-26 NOTE — Consult Note (Addendum)
Asked to evaluate for capacity to refuse dispo to SNF. Exam limited by agitation, pt hard of hearing.  Patient with capacity to refuse dispo to SNF.    In an evaluation of capacity, each of the following criteria must be met based on medical necessity in order for a patient to have capacity to make the decision in question. Of note, the capacity evaluation assesses only for the specified decision documented above and is not a determination of the patient's overall competency, which can only be adjudicated.  Criterion 1: The patient demonstrates a clear and consistent voluntary choice with regard to treatment options. Clearly does not want to go to SNF  Criterion 2: The patient adequately understands the disease they have, the treatment proposed, the risks of treatment, and the risks of other treatment (including no treatment). Makes multiple references to weakness  Criterion 3: The patient acknowledges that the details of Criterion 2 apply to them specifically and the likely consequences of treatment options proposed. Understands increased risk of debility, falls, death  Criterion 4: The patient demonstrates adequate reasoning/rationality within the context of their decision and can provide justification for their choice. Cites family history of early death, focus on quality of life, and presence of caretaker at motel  Pt is agitated. States "they want me to go to rehab! Why the hell would I want to go to rehab if I don't need it!". He wants to go home to cook a ribeye steak. Has someone to look after him. He is able to reiterate his understanding that "they are worried about me not being able to walk again!" He plans to take . He has a Materials engineer room rented. He plans to get stronger by eating food. 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 He plans on going back to work when he is stronger. He is oriented to person, place, and situation. Refused to  answer questions about date and year because he thinks they are stupid. Knows most recent holiday  He is able to name his chronic medical conditions including COPD. Knows he was in here for pneumonia and weakness. Has been discussing his "ladyfriend" as a social support throughout hospitalization. No SI, HI, or AH/VH "Are you trying to say I'm crazy!!!". Numerous curse words and slurs used through interview.    I personally spent 25 minutes on the unit in direct patient care. The direct patient care time included face-to-face time with the patient, reviewing the patient's chart, communicating with other professionals, and coordinating care. Greater than 50% of this time was spent in counseling or coordinating care with the patient regarding goals of hospitalization, psycho-education, and discharge planning needs.

## 2021-04-26 NOTE — Progress Notes (Signed)
OT Cancellation Note  Patient Details Name: Ray Smith MRN: [id number] DOB: 01-29-1946   Cancelled Treatment:    Reason Eval/Treat Not Completed: Patient declined, no reason specified;Other (comment) pt politely declined therapy stating he was just waiting for the MD to come release him) will f/u as time allows for OT session.  Lenor Derrick., COTA/L Acute Rehabilitation Services [phone]   Barron Schmid 04/26/2021, 9:38 AM

## 2021-04-27 ENCOUNTER — Other Ambulatory Visit (HOSPITAL_COMMUNITY): Payer: Self-pay

## 2021-04-27 ENCOUNTER — Telehealth: Payer: Self-pay | Admitting: *Deleted

## 2021-04-27 NOTE — Telephone Encounter (Signed)
Transition Care Management Unsuccessful Follow-up Telephone Call  Date of discharge and from where:  04/26/2021 [place]  Attempts:  1st Attempt  Reason for unsuccessful TCM follow-up call:  No answer/busy

## 2021-04-28 NOTE — Telephone Encounter (Signed)
Transition Care Management Unsuccessful Follow-up Telephone Call  Date of discharge and from where:  04/26/2021 [place]  Attempts:  2nd Attempt  Reason for unsuccessful TCM follow-up call:  No answer/busy

## 2021-04-29 NOTE — Telephone Encounter (Signed)
Transition Care Management Unsuccessful Follow-up Telephone Call  Date of discharge and from where:  04/26/2021 [place]  Attempts:  3rd Attempt  Reason for unsuccessful TCM follow-up call:  No answer/busy

## 2021-05-05 ENCOUNTER — Telehealth (HOSPITAL_COMMUNITY): Payer: Self-pay | Admitting: Pharmacist

## 2021-05-05 NOTE — Telephone Encounter (Signed)
Unable to reach.

## 2021-05-09 ENCOUNTER — Telehealth (HOSPITAL_COMMUNITY): Payer: Self-pay | Admitting: Pharmacist

## 2021-05-09 NOTE — Telephone Encounter (Signed)
2nd attempt, no answer

## 2021-05-11 ENCOUNTER — Telehealth (HOSPITAL_COMMUNITY): Payer: Self-pay | Admitting: Pharmacist

## 2021-05-11 NOTE — Telephone Encounter (Signed)
3rd and final attempt.

## 2021-05-23 ENCOUNTER — Encounter (HOSPITAL_COMMUNITY): Payer: Self-pay | Admitting: Radiology

## 2021-05-31 ENCOUNTER — Ambulatory Visit (INDEPENDENT_AMBULATORY_CARE_PROVIDER_SITE_OTHER): Payer: 59 | Admitting: Nurse Practitioner

## 2021-05-31 ENCOUNTER — Other Ambulatory Visit: Payer: Self-pay

## 2021-05-31 DIAGNOSIS — Z91199 Patient's noncompliance with other medical treatment and regimen due to unspecified reason: Secondary | ICD-10-CM

## 2021-05-31 NOTE — Progress Notes (Signed)
Careteam: Patient Care Team: Ngetich, Donalee Citrin, NP as PCP - General (Family Medicine) Jake Bathe, MD as PCP - Cardiology (Cardiology) Patient, No Pcp Per (Inactive) (General Practice)  Advanced Directive information    Allergies  Allergen Reactions   Tramadol Anaphylaxis   Flexeril [Cyclobenzaprine] Other (See Comments)    Per patient "it made my heart stop"   Ibuprofen Other (See Comments)    Overuse damages pt's kidneys    Ibuprofen Nausea Only and Other (See Comments)    Reaction not recalled- perhaps made his stomach hurt   Lactose Intolerance (Gi) Other (See Comments)    Digestive issues    Chief Complaint  Patient presents with   Acute Visit    Patient requesting refills on medication. Patient wants all medications refilled that are not for 90 day supply. Would like 90 day supply on all medication.Patient wants mirtazapine changed back to what he was taking before     HPI: Patient is a 75 y.o. male for refills via telephone visit.  Pt established care with Carilyn Goodpasture, NP on 02/14/21.  He was to follow up in 1 month after initial appt. Did not follow up.  He reports he was just discharged from the hospital and there was no need to come into the office. Aggressively stated he needed refills.  Pt reports medication he was given in our office was "trying to kill him."  His hospitalization was from 04/17/2021-04/26/21 recommended follow up in office for evaluation and labs in 1 week due to community acquired pneumonia with AKI, acute on chronic hypoxic respiratory.  He has yet to follow up despite recommendation and attempts from staff.  Of note during hospitalization he was evaluated by psychiatry and felt to have capacity.   Review of Systems:  Review of Systems  Unable to perform ROS: Other  ROS was unable to be performed due to agitation.   Past Medical History:  Diagnosis Date   Acute ST elevation myocardial infarction (STEMI) involving left anterior descending  (LAD) coronary artery (HCC) 12/30/2017   Acute systolic heart failure (HCC) 12/30/2017   Congestive heart failure (CHF) (HCC)    COPD (chronic obstructive pulmonary disease) (HCC)    Coronary artery disease    a.  presented with CP and inf STE but no ACS - LHC (10/23/13):  Dist LM 40-60%, ostial LAD 80%, mid LAD 50%, ostial CFX 50-70%, mid RCA 50%.  EF 60%. - CP not felt to be ischemic; CABG vs Med Rx d/w pt - pt opted for Med Rx   History of kidney stones    "more than 20" (01/09/2018)   Hyperlipidemia    Hypertension    Marijuana abuse    MI (mitral incompetence)    Tobacco dependence    Past Surgical History:  Procedure Laterality Date   CARDIAC CATHETERIZATION     CORONARY/GRAFT ACUTE MI REVASCULARIZATION N/A 12/30/2017   Procedure: Coronary/Graft Acute MI Revascularization;  Surgeon: Tonny Bollman, MD;  Location: [facility] INVASIVE CV LAB;  Service: Cardiovascular;  Laterality: N/A;  Distal LM into LAD Xience [place] 3.5x49mm Ostial Cx Xience [place] 3.5x23mm   LEFT HEART CATH AND CORONARY ANGIOGRAPHY N/A 12/30/2017   Procedure: LEFT HEART CATH AND CORONARY ANGIOGRAPHY;  Surgeon: Tonny Bollman, MD;  Location: [facility] INVASIVE CV LAB;  Service: Cardiovascular;  Laterality: N/A;   LEFT HEART CATHETERIZATION WITH CORONARY ANGIOGRAM Bilateral 10/23/2013   Procedure: LEFT HEART CATHETERIZATION WITH CORONARY ANGIOGRAM;  Surgeon: Lesleigh Noe, MD;  Location: [facility] CATH  LAB;  Service: Cardiovascular;  Laterality: Bilateral;   snake bite surgery     TONSILLECTOMY  1955   TOTAL KNEE ARTHROPLASTY  2001   Social History:   reports that he has quit smoking. He has never used smokeless tobacco. He reports that he does not currently use alcohol. He reports that he does not currently use drugs after having used the following drugs: Marijuana.  Family History  Problem Relation Age of Onset   Heart disease Mother    Alzheimer's disease Mother    Alzheimer's disease Father    Congestive Heart Failure Father     Multiple sclerosis Sister    Diabetes Maternal Grandmother    Drug abuse Other     Medications: Patient's Medications  New Prescriptions   No medications on file  Previous Medications   ALBUTEROL (PROVENTIL) (2.5 MG/3ML) 0.083% NEBULIZER SOLUTION    Use 1 vial (2.5 mg total) by nebulization every 6 (six) hours as needed for wheezing or shortness of breath.   ALBUTEROL (VENTOLIN HFA) 108 (90 BASE) MCG/ACT INHALER    Inhale 2 puffs into the lungs every 6 (six) hours as needed for wheezing or shortness of breath.   ASPIRIN 81 MG EC TABLET    Take 1 tablet (81 mg total) by mouth daily.   ATORVASTATIN (LIPITOR) 80 MG TABLET    Take 1 tablet (80 mg total) by mouth daily at 6 PM.   CARVEDILOL (COREG) 6.25 MG TABLET    Take 0.5 tablets (3.125 mg total) by mouth 2 (two) times daily with a meal.   CETIRIZINE (ZYRTEC) 10 MG TABLET    Take 1 tablet (10 mg total) by mouth daily.   CLOPIDOGREL (PLAVIX) 75 MG TABLET    Take 1 tablet (75 mg total) by mouth daily.   FLUTICASONE-SALMETEROL (ADVAIR) 250-50 MCG/ACT AEPB    Inhale 1 puff into the lungs in the morning and at bedtime.   FUROSEMIDE (LASIX) 40 MG TABLET    Take 1 tablet (40 mg total) by mouth daily.   IPRATROPIUM (ATROVENT) 0.03 % NASAL SPRAY    Place 2 sprays into both nostrils every 12 (twelve) hours.   MIRTAZAPINE (REMERON) 7.5 MG TABLET    Take 1 tablet (7.5 mg total) by mouth at bedtime.   NITROGLYCERIN (NITROSTAT) 0.4 MG SL TABLET    Place 1 tablet (0.4 mg total) under the tongue every 5 (five) minutes x 3 doses as needed for chest pain.   OXYGEN    Inhale 5 L/min into the lungs at bedtime as needed (for shortness of breath).   PANTOPRAZOLE (PROTONIX) 40 MG TABLET    Take 1 tablet (40 mg total) by mouth at bedtime.   POLYETHYLENE GLYCOL POWDER (GLYCOLAX/MIRALAX) 17 GM/SCOOP POWDER    Take 17 g by mouth daily.   TIOTROPIUM BROMIDE-OLODATEROL (STIOLTO RESPIMAT) 2.5-2.5 MCG/ACT AERS    INHALE 2 PUFFS BY MOUTH INTO THE LUNGS DAILY  Modified  Medications   No medications on file  Discontinued Medications   No medications on file    Physical Exam:  There were no vitals filed for this visit. There is no height or weight on file to calculate BMI. Wt Readings from Last 3 Encounters:  04/26/21 99 lb 3.3 oz (45 kg)  02/14/21 108 lb 9.6 oz (49.3 kg)  11/19/20 110 lb 9.6 oz (50.2 kg)      Labs reviewed: Basic Metabolic Panel: Recent Labs    11/16/20 0349 11/17/20 0205 04/21/21 0113 04/22/21 0116 04/23/21 0142 04/24/21  0806  NA 142   < > 135 137 136 137  K 4.3   < > 3.9 3.7 3.5 3.7  CL 107   < > 96* 95* 95* 99  CO2 27   < > 31 33* 34* 28  GLUCOSE 105*   < > 98 89 97 118*  BUN 26*   < > 35* 30* 32* 30*  CREATININE 1.45*   < > 1.09 1.07 1.05 1.19  CALCIUM 9.0   < > 8.1* 7.8* 8.0* 7.9*  MG  --    < >  --  1.8 2.4 2.0  PHOS  --    < > 2.2* 4.4 2.1*  --   TSH 2.618  --   --   --   --   --    < > = values in this interval not displayed.   Liver Function Tests: Recent Labs    04/19/21 0412 04/20/21 0142 04/22/21 0116  AST 21 27 33  ALT 11 12 18   ALKPHOS 87 96 91  BILITOT 1.0 1.1 0.5  PROT 5.1* 5.5* 5.5*  ALBUMIN 1.9* 2.0* 1.9*   No results for input(s): LIPASE, AMYLASE in the last 8760 hours. No results for input(s): AMMONIA in the last 8760 hours. CBC: Recent Labs    04/19/21 0412 04/20/21 0142 04/21/21 0113 04/22/21 0116 04/23/21 0142 04/24/21 0806  WBC 13.6* 15.2*   < > 15.6* 15.0* 14.5*  NEUTROABS 12.5* 13.9*  --   --   --  12.9*  HGB 11.7* 12.7*   < > 11.7* 11.6* 11.7*  HCT 36.1* 38.9*   < > 35.3* 35.7* 36.9*  MCV 87.0 87.6   < > 86.7 87.5 89.1  PLT 191 216   < > 210 225 200   < > = values in this interval not displayed.   Lipid Panel: Recent Labs    11/19/20 1032  CHOL 117  HDL 43  LDLCALC 57  TRIG 85  CHOLHDL 2.7   TSH: Recent Labs    11/16/20 0349  TSH 2.618   A1C: Lab Results  Component Value Date   HGBA1C 5.6 11/19/2020     Assessment/Plan 1. Noncompliance -pt has  not followed up in office since hospital discharge. The staff has recommended follow up in office several times and when I discussed need for follow up in person prior to refills he got very agitated and said "I told you I do not have a ride God damn it"  I told the patient that I would have to stop the phone visit due to increase in yelling and profanity. I told him that we do not tolerate this behavior at our office and if it happened again he would be discharged and need to seek medical care at another office. Pt reported he understood and I told him I would get the staff to call him to try to arrange transportation.  Discussed with front desk staff, reports he has driven to office several times in the past (when stated he does not drive). They will attempt to call him to get an in office visit for follow up with lab work due to HFrEF, COPD-on home O2, CAD, HTN, HLD, AKI  No refills provided until in office visit and labs obtained.   11/21/2020. Janene Harvey  [facility] [phone]    Virtual Visit via telephone  I connected with patient on 05/31/21 at 10:30 AM EST by telephone and verified that I  am speaking with the correct person using two identifiers.  Location: Patient: home Provider: [place]    I discussed the limitations, risks, security and privacy concerns of performing an evaluation and management service by telephone and the availability of in person appointments. I also discussed with the patient that there may be a patient responsible charge related to this service. The patient expressed understanding and agreed to proceed.   I discussed the assessment and treatment plan with the patient. The patient was provided an opportunity to ask questions and all were answered. The patient agreed with the plan and demonstrated an understanding of the instructions.   The patient was advised to call back or seek an in-person evaluation if the symptoms worsen  or if the condition fails to improve as anticipated.  I provided 11 minutes of non-face-to-face time during this encounter.  Janene Harvey. [employer] printed and mailed

## 2021-05-31 NOTE — Progress Notes (Signed)
This service is provided via telemedicine  No vital signs collected/recorded due to the encounter was a telemedicine visit.   Location of patient (ex: home, work):  Home  Patient consents to a telephone visit:  yes, see encounter dated 05/31/2021  Location of the provider (ex: office, home):  [employer]  Name of any referring provider:  Ngetich, Dinah, NP  Names of all persons participating in the telemedicine service and their role in the encounter:  Abbey Chatters, Nurse Practitioner, [name], CMA, and patient.   Time spent on call:  11 minutes with medical assistant

## 2021-06-02 ENCOUNTER — Other Ambulatory Visit: Payer: 59

## 2021-06-06 ENCOUNTER — Other Ambulatory Visit: Payer: Self-pay

## 2021-06-06 ENCOUNTER — Other Ambulatory Visit: Payer: 59

## 2021-06-07 LAB — CBC WITH DIFFERENTIAL/PLATELET
Absolute Monocytes: 764 cells/uL (ref 200–950)
Basophils Absolute: 78 cells/uL (ref 0–200)
Basophils Relative: 1 %
Eosinophils Absolute: 257 cells/uL (ref 15–500)
Eosinophils Relative: 3.3 %
HCT: 33.6 % — ABNORMAL LOW (ref 38.5–50.0)
Hemoglobin: 10.8 g/dL — ABNORMAL LOW (ref 13.2–17.1)
Lymphs Abs: 663 cells/uL — ABNORMAL LOW (ref 850–3900)
MCH: 30.3 pg (ref 27.0–33.0)
MCHC: 32.1 g/dL (ref 32.0–36.0)
MCV: 94.1 fL (ref 80.0–100.0)
MPV: 10.3 fL (ref 7.5–12.5)
Monocytes Relative: 9.8 %
Neutro Abs: 6037 cells/uL (ref 1500–7800)
Neutrophils Relative %: 77.4 %
Platelets: 307 10*3/uL (ref 140–400)
RBC: 3.57 10*6/uL — ABNORMAL LOW (ref 4.20–5.80)
RDW: 15.1 % — ABNORMAL HIGH (ref 11.0–15.0)
Total Lymphocyte: 8.5 %
WBC: 7.8 10*3/uL (ref 3.8–10.8)

## 2021-06-07 LAB — HEPATITIS C ANTIBODY
Hepatitis C Ab: NONREACTIVE
SIGNAL TO CUT-OFF: 0.16 (ref <1.00)

## 2021-06-07 LAB — COMPLETE METABOLIC PANEL WITH GFR
AG Ratio: 1.1 (calc) (ref 1.0–2.5)
ALT: 8 U/L — ABNORMAL LOW (ref 9–46)
AST: 15 U/L (ref 10–35)
Albumin: 3.3 g/dL — ABNORMAL LOW (ref 3.6–5.1)
Alkaline phosphatase (APISO): 120 U/L (ref 35–144)
BUN: 19 mg/dL (ref 7–25)
CO2: 25 mmol/L (ref 20–32)
Calcium: 8.7 mg/dL (ref 8.6–10.3)
Chloride: 107 mmol/L (ref 98–110)
Creat: 0.83 mg/dL (ref 0.70–1.28)
Globulin: 3 g/dL (calc) (ref 1.9–3.7)
Glucose, Bld: 83 mg/dL (ref 65–99)
Potassium: 4.8 mmol/L (ref 3.5–5.3)
Sodium: 140 mmol/L (ref 135–146)
Total Bilirubin: 0.4 mg/dL (ref 0.2–1.2)
Total Protein: 6.3 g/dL (ref 6.1–8.1)
eGFR: 91 mL/min/{1.73_m2} (ref >=60)

## 2021-06-07 LAB — LIPID PANEL
Cholesterol: 157 mg/dL (ref <200)
HDL: 63 mg/dL (ref >=40)
LDL Cholesterol (Calc): 79 mg/dL (calc)
Non-HDL Cholesterol (Calc): 94 mg/dL (calc) (ref <130)
Total CHOL/HDL Ratio: 2.5 (calc) (ref <5.0)
Triglycerides: 73 mg/dL (ref <150)

## 2021-06-07 LAB — TSH: TSH: 3.83 mIU/L (ref 0.40–4.50)

## 2021-06-22 ENCOUNTER — Encounter (HOSPITAL_COMMUNITY): Payer: Self-pay | Admitting: Oncology

## 2021-06-22 ENCOUNTER — Other Ambulatory Visit: Payer: Self-pay

## 2021-06-22 ENCOUNTER — Emergency Department (HOSPITAL_COMMUNITY): Payer: 59

## 2021-06-22 ENCOUNTER — Inpatient Hospital Stay (HOSPITAL_COMMUNITY)
Admission: EM | Admit: 2021-06-22 | Discharge: 2021-06-25 | DRG: 193 | Disposition: A | Discharge status: Home / Self Care | Payer: 59 | Attending: Internal Medicine | Admitting: Internal Medicine

## 2021-06-22 DIAGNOSIS — E43 Unspecified severe protein-calorie malnutrition: Secondary | ICD-10-CM | POA: Diagnosis present

## 2021-06-22 DIAGNOSIS — J189 Pneumonia, unspecified organism: Secondary | ICD-10-CM | POA: Diagnosis present

## 2021-06-22 DIAGNOSIS — I5022 Chronic systolic (congestive) heart failure: Secondary | ICD-10-CM

## 2021-06-22 DIAGNOSIS — Z7951 Long term (current) use of inhaled steroids: Secondary | ICD-10-CM

## 2021-06-22 DIAGNOSIS — Z9114 Patient's other noncompliance with medication regimen: Secondary | ICD-10-CM

## 2021-06-22 DIAGNOSIS — Z9981 Dependence on supplemental oxygen: Secondary | ICD-10-CM | POA: Diagnosis not present

## 2021-06-22 DIAGNOSIS — I2510 Atherosclerotic heart disease of native coronary artery without angina pectoris: Secondary | ICD-10-CM | POA: Diagnosis present

## 2021-06-22 DIAGNOSIS — Z20822 Contact with and (suspected) exposure to covid-19: Secondary | ICD-10-CM | POA: Diagnosis present

## 2021-06-22 DIAGNOSIS — Z96659 Presence of unspecified artificial knee joint: Secondary | ICD-10-CM | POA: Diagnosis present

## 2021-06-22 DIAGNOSIS — J9621 Acute and chronic respiratory failure with hypoxia: Secondary | ICD-10-CM | POA: Diagnosis present

## 2021-06-22 DIAGNOSIS — E785 Hyperlipidemia, unspecified: Secondary | ICD-10-CM

## 2021-06-22 DIAGNOSIS — I25118 Atherosclerotic heart disease of native coronary artery with other forms of angina pectoris: Secondary | ICD-10-CM

## 2021-06-22 DIAGNOSIS — R042 Hemoptysis: Secondary | ICD-10-CM | POA: Diagnosis present

## 2021-06-22 DIAGNOSIS — I252 Old myocardial infarction: Secondary | ICD-10-CM

## 2021-06-22 DIAGNOSIS — J441 Chronic obstructive pulmonary disease with (acute) exacerbation: Secondary | ICD-10-CM

## 2021-06-22 DIAGNOSIS — R64 Cachexia: Secondary | ICD-10-CM | POA: Diagnosis present

## 2021-06-22 DIAGNOSIS — Z7902 Long term (current) use of antithrombotics/antiplatelets: Secondary | ICD-10-CM

## 2021-06-22 DIAGNOSIS — I5023 Acute on chronic systolic (congestive) heart failure: Secondary | ICD-10-CM | POA: Diagnosis present

## 2021-06-22 DIAGNOSIS — J188 Other pneumonia, unspecified organism: Secondary | ICD-10-CM

## 2021-06-22 DIAGNOSIS — Z885 Allergy status to narcotic agent status: Secondary | ICD-10-CM

## 2021-06-22 DIAGNOSIS — Z8249 Family history of ischemic heart disease and other diseases of the circulatory system: Secondary | ICD-10-CM

## 2021-06-22 DIAGNOSIS — I130 Hypertensive heart and chronic kidney disease with heart failure and stage 1 through stage 4 chronic kidney disease, or unspecified chronic kidney disease: Secondary | ICD-10-CM | POA: Diagnosis present

## 2021-06-22 DIAGNOSIS — R0609 Other forms of dyspnea: Secondary | ICD-10-CM | POA: Diagnosis not present

## 2021-06-22 DIAGNOSIS — J440 Chronic obstructive pulmonary disease with acute lower respiratory infection: Secondary | ICD-10-CM | POA: Diagnosis present

## 2021-06-22 DIAGNOSIS — Z888 Allergy status to other drugs, medicaments and biological substances status: Secondary | ICD-10-CM

## 2021-06-22 DIAGNOSIS — I5021 Acute systolic (congestive) heart failure: Secondary | ICD-10-CM

## 2021-06-22 DIAGNOSIS — Z87891 Personal history of nicotine dependence: Secondary | ICD-10-CM | POA: Diagnosis not present

## 2021-06-22 DIAGNOSIS — Z7982 Long term (current) use of aspirin: Secondary | ICD-10-CM

## 2021-06-22 DIAGNOSIS — I4510 Unspecified right bundle-branch block: Secondary | ICD-10-CM | POA: Diagnosis not present

## 2021-06-22 DIAGNOSIS — I10 Essential (primary) hypertension: Secondary | ICD-10-CM | POA: Diagnosis present

## 2021-06-22 DIAGNOSIS — J3089 Other allergic rhinitis: Secondary | ICD-10-CM

## 2021-06-22 DIAGNOSIS — J90 Pleural effusion, not elsewhere classified: Secondary | ICD-10-CM

## 2021-06-22 DIAGNOSIS — Z28310 Unvaccinated for covid-19: Secondary | ICD-10-CM

## 2021-06-22 DIAGNOSIS — N182 Chronic kidney disease, stage 2 (mild): Secondary | ICD-10-CM | POA: Diagnosis present

## 2021-06-22 DIAGNOSIS — I350 Nonrheumatic aortic (valve) stenosis: Secondary | ICD-10-CM | POA: Diagnosis present

## 2021-06-22 DIAGNOSIS — J449 Chronic obstructive pulmonary disease, unspecified: Secondary | ICD-10-CM

## 2021-06-22 DIAGNOSIS — Z955 Presence of coronary angioplasty implant and graft: Secondary | ICD-10-CM | POA: Diagnosis not present

## 2021-06-22 DIAGNOSIS — Z886 Allergy status to analgesic agent status: Secondary | ICD-10-CM

## 2021-06-22 DIAGNOSIS — E739 Lactose intolerance, unspecified: Secondary | ICD-10-CM | POA: Diagnosis present

## 2021-06-22 DIAGNOSIS — I739 Peripheral vascular disease, unspecified: Secondary | ICD-10-CM | POA: Diagnosis present

## 2021-06-22 DIAGNOSIS — D631 Anemia in chronic kidney disease: Secondary | ICD-10-CM | POA: Diagnosis present

## 2021-06-22 DIAGNOSIS — R0602 Shortness of breath: Secondary | ICD-10-CM | POA: Diagnosis not present

## 2021-06-22 DIAGNOSIS — J310 Chronic rhinitis: Secondary | ICD-10-CM

## 2021-06-22 DIAGNOSIS — Z681 Body mass index (BMI) 19 or less, adult: Secondary | ICD-10-CM

## 2021-06-22 DIAGNOSIS — Z833 Family history of diabetes mellitus: Secondary | ICD-10-CM

## 2021-06-22 DIAGNOSIS — Z820 Family history of epilepsy and other diseases of the nervous system: Secondary | ICD-10-CM

## 2021-06-22 DIAGNOSIS — R9431 Abnormal electrocardiogram [ECG] [EKG]: Secondary | ICD-10-CM | POA: Diagnosis present

## 2021-06-22 DIAGNOSIS — Z91199 Patient's noncompliance with other medical treatment and regimen due to unspecified reason: Secondary | ICD-10-CM | POA: Diagnosis present

## 2021-06-22 DIAGNOSIS — Z79899 Other long term (current) drug therapy: Secondary | ICD-10-CM

## 2021-06-22 LAB — CBC WITH DIFFERENTIAL/PLATELET
Abs Immature Granulocytes: 0.05 10*3/uL (ref 0.00–0.07)
Basophils Absolute: 0.1 10*3/uL (ref 0.0–0.1)
Basophils Relative: 1 %
Eosinophils Absolute: 0.2 10*3/uL (ref 0.0–0.5)
Eosinophils Relative: 2 %
HCT: 37.7 % — ABNORMAL LOW (ref 39.0–52.0)
Hemoglobin: 11.4 g/dL — ABNORMAL LOW (ref 13.0–17.0)
Immature Granulocytes: 1 %
Lymphocytes Relative: 6 %
Lymphs Abs: 0.7 10*3/uL (ref 0.7–4.0)
MCH: 29.5 pg (ref 26.0–34.0)
MCHC: 30.2 g/dL (ref 30.0–36.0)
MCV: 97.4 fL (ref 80.0–100.0)
Monocytes Absolute: 0.9 10*3/uL (ref 0.1–1.0)
Monocytes Relative: 9 %
Neutro Abs: 8.5 10*3/uL — ABNORMAL HIGH (ref 1.7–7.7)
Neutrophils Relative %: 81 %
Platelets: 271 10*3/uL (ref 150–400)
RBC: 3.87 MIL/uL — ABNORMAL LOW (ref 4.22–5.81)
RDW: 16.2 % — ABNORMAL HIGH (ref 11.5–15.5)
WBC: 10.4 10*3/uL (ref 4.0–10.5)
nRBC: 0 % (ref 0.0–0.2)

## 2021-06-22 LAB — COMPREHENSIVE METABOLIC PANEL
ALT: 12 U/L (ref 0–44)
AST: 24 U/L (ref 15–41)
Albumin: 3.3 g/dL — ABNORMAL LOW (ref 3.5–5.0)
Alkaline Phosphatase: 147 U/L — ABNORMAL HIGH (ref 38–126)
Anion gap: 5 (ref 5–15)
BUN: 24 mg/dL — ABNORMAL HIGH (ref 8–23)
CO2: 29 mmol/L (ref 22–32)
Calcium: 8.7 mg/dL — ABNORMAL LOW (ref 8.9–10.3)
Chloride: 105 mmol/L (ref 98–111)
Creatinine, Ser: 1.02 mg/dL (ref 0.61–1.24)
GFR, Estimated: >60 mL/min (ref >60)
Glucose, Bld: 107 mg/dL — ABNORMAL HIGH (ref 70–99)
Potassium: 3.9 mmol/L (ref 3.5–5.1)
Sodium: 139 mmol/L (ref 135–145)
Total Bilirubin: 0.7 mg/dL (ref 0.3–1.2)
Total Protein: 7.1 g/dL (ref 6.5–8.1)

## 2021-06-22 LAB — BLOOD GAS, VENOUS
Acid-Base Excess: 4.9 mmol/L — ABNORMAL HIGH (ref 0.0–2.0)
Bicarbonate: 31.2 mmol/L — ABNORMAL HIGH (ref 20.0–28.0)
O2 Saturation: 22.1 %
Patient temperature: 98.6
pCO2, Ven: 57.6 mmHg (ref 44.0–60.0)
pH, Ven: 7.354 (ref 7.250–7.430)
pO2, Ven: <31 mmHg — CL (ref 32.0–45.0)

## 2021-06-22 LAB — BRAIN NATRIURETIC PEPTIDE: B Natriuretic Peptide: 3653.6 pg/mL — ABNORMAL HIGH (ref 0.0–100.0)

## 2021-06-22 LAB — MAGNESIUM: Magnesium: 2.4 mg/dL (ref 1.7–2.4)

## 2021-06-22 LAB — RESP PANEL BY RT-PCR (FLU A&B, COVID) ARPGX2
Influenza A by PCR: NEGATIVE
Influenza B by PCR: NEGATIVE
SARS Coronavirus 2 by RT PCR: NEGATIVE

## 2021-06-22 MED ORDER — IOHEXOL 350 MG/ML SOLN
80.0000 mL | Freq: Once | INTRAVENOUS | Status: AC | PRN
  Administered 2021-06-22: 20:00:00 80 mL via INTRAVENOUS

## 2021-06-22 MED ORDER — METHYLPREDNISOLONE SODIUM SUCC 125 MG IJ SOLR
125.0000 mg | Freq: Once | INTRAMUSCULAR | Status: AC
  Administered 2021-06-22: 18:00:00 125 mg via INTRAVENOUS
  Filled 2021-06-22: qty 2

## 2021-06-22 MED ORDER — IPRATROPIUM-ALBUTEROL 0.5-2.5 (3) MG/3ML IN SOLN
3.0000 mL | Freq: Once | RESPIRATORY_TRACT | Status: AC
  Administered 2021-06-22: 17:00:00 3 mL via RESPIRATORY_TRACT
  Filled 2021-06-22: qty 3

## 2021-06-22 MED ORDER — VANCOMYCIN HCL IN DEXTROSE 1-5 GM/200ML-% IV SOLN
1000.0000 mg | Freq: Once | INTRAVENOUS | Status: AC
  Administered 2021-06-22: 22:00:00 1000 mg via INTRAVENOUS
  Filled 2021-06-22: qty 200

## 2021-06-22 MED ORDER — VANCOMYCIN HCL 1250 MG/250ML IV SOLN: 1250.0000 mg | INTRAVENOUS | Status: DC

## 2021-06-22 MED ORDER — SODIUM CHLORIDE 0.9 % IV SOLN
2.0000 g | Freq: Once | INTRAVENOUS | Status: AC
  Administered 2021-06-22: 21:00:00 2 g via INTRAVENOUS
  Filled 2021-06-22: qty 2

## 2021-06-22 MED ORDER — SODIUM CHLORIDE 0.9 % IV SOLN
2.0000 g | Freq: Two times a day (BID) | INTRAVENOUS | Status: DC
  Administered 2021-06-23 (×2): 2 g via INTRAVENOUS
  Filled 2021-06-22 (×5): qty 2

## 2021-06-22 NOTE — ED Notes (Addendum)
PO2 under 31.0, MD Dixon made aware.

## 2021-06-22 NOTE — Subjective & Objective (Signed)
SOB, cough wheezing has been out out his home inhalers On home o2 2L no change

## 2021-06-22 NOTE — Progress Notes (Signed)
A consult was received from an ED physician for vancomycin per pharmacy dosing.  The patient's profile has been reviewed for ht/wt/allergies/indication/available labs.    STAT ht/wt order entered. Used last recorded weight of 45 kg from 04/24/21 for dosing.  A one time order has been placed for vancomycin 1000 mg IV once.  Further antibiotics/pharmacy consults should be ordered by admitting physician if indicated.                       Thank you, Cindi Carbon, PharmD 06/22/2021  9:01 PM

## 2021-06-22 NOTE — ED Triage Notes (Signed)
Pt bib [facility] from home d/t shob and chest tightness.  Pt reports being out of breathing treatments x 2 weeks.

## 2021-06-22 NOTE — ED Provider Notes (Signed)
[facility]-EMERGENCY DEPT Provider Note   CSN: [id number] Arrival date & time: 06/22/21  1617     History Chief Complaint  Patient presents with   Shortness of Breath    Ray Smith is a 75 y.o. male.  HPI Patient with history of CHF, COPD (on supplemental oxygen at baseline), CAD, HTN, HLD, presenting for shortness of breath and hemoptysis.  He had a hospitalization 2 months ago for pneumonia.  He also states that he had a hospitalization 2 to 3 weeks ago, however, this is not present in EMR.  He reports that he typically uses nebulized breathing treatments daily for his COPD.  Over the past 2 weeks, he has not had any of his nebulized medicines.  He states that he does have his heart medicines.  Over the past 2 weeks, he has had worsening shortness of breath, chest tightness.  He is also developed green purulent sputum and had some specks of blood in his mucus today.  He has been living with his son due to his declining health.  At baseline, he is on 5 L of supplemental oxygen    Past Medical History:  Diagnosis Date   Acute ST elevation myocardial infarction (STEMI) involving left anterior descending (LAD) coronary artery (HCC) 12/30/2017   Acute systolic heart failure (HCC) 12/30/2017   Congestive heart failure (CHF) (HCC)    COPD (chronic obstructive pulmonary disease) (HCC)    Coronary artery disease    a.  presented with CP and inf STE but no ACS - LHC (10/23/13):  Dist LM 40-60%, ostial LAD 80%, mid LAD 50%, ostial CFX 50-70%, mid RCA 50%.  EF 60%. - CP not felt to be ischemic; CABG vs Med Rx d/w pt - pt opted for Med Rx   History of kidney stones    "more than 20" (01/09/2018)   Hyperlipidemia    Hypertension    Marijuana abuse    MI (mitral incompetence)    Tobacco dependence     Patient Active Problem List   Diagnosis Date Noted   CAP (community acquired pneumonia) 06/22/2021   Atypical pneumonia 04/17/2021   CKD (chronic kidney disease) stage  3, GFR 30-59 ml/min (HCC) 04/17/2021   Hypoxemia 04/17/2021   Prolonged QT interval 04/17/2021   HFrEF (heart failure with reduced ejection fraction) (HCC) 04/17/2021   Hypokalemia 04/17/2021   Former cigarette smoker 02/14/2021   Congestive heart failure (CHF) (HCC)    Coronary artery disease    Hypertension     Acute decompensated heart failure (HCC) 11/02/2020   PVD (peripheral vascular disease) (HCC) 11/02/2020   COPD with acute exacerbation (HCC) 09/03/2019   Acute exacerbation of CHF (congestive heart failure) (HCC) 09/02/2019   Adjustment disorder with mixed disturbance of emotions and conduct    Protein-calorie malnutrition, severe 01/10/2018   Orthostatic hypotension 01/09/2018   Weakness 01/09/2018   Mouth pain 01/09/2018   CKD (chronic kidney disease) stage 2, GFR 60-89 ml/min 01/09/2018   Marijuana abuse 01/09/2018   Acute systolic heart failure (HCC) 01/02/2018   STEMI (ST elevation myocardial infarction) (HCC) 12/30/2017   STEMI involving left anterior descending coronary artery (HCC) 12/30/2017   Acute encephalopathy 10/06/2017   AKI (acute kidney injury) (HCC)    Altered mental status    Hypertension 11/12/2013   Dyslipidemia 10/24/2013   ACS (acute coronary syndrome) (HCC) 10/23/2013   Chest pain 10/23/2013   Coronary Artery Disease 10/23/2013   COPD (chronic obstructive pulmonary disease) (HCC) 10/23/2013  Tobacco abuse 10/23/2013   Precordial pain 10/23/2013    Past Surgical History:  Procedure Laterality Date   CARDIAC CATHETERIZATION     CORONARY/GRAFT ACUTE MI REVASCULARIZATION N/A 12/30/2017   Procedure: Coronary/Graft Acute MI Revascularization;  Surgeon: Tonny Bollman, MD;  Location: [facility] INVASIVE CV LAB;  Service: Cardiovascular;  Laterality: N/A;  Distal LM into LAD Xience [place] 3.5x85mm Ostial Cx Xience [place] 3.5x72mm   LEFT HEART CATH AND CORONARY ANGIOGRAPHY N/A 12/30/2017   Procedure: LEFT HEART CATH AND CORONARY ANGIOGRAPHY;  Surgeon: Tonny Bollman, MD;  Location: [facility] INVASIVE CV LAB;  Service: Cardiovascular;  Laterality: N/A;   LEFT HEART CATHETERIZATION WITH CORONARY ANGIOGRAM Bilateral 10/23/2013   Procedure: LEFT HEART CATHETERIZATION WITH CORONARY ANGIOGRAM;  Surgeon: Lesleigh Noe, MD;  Location: [facility] CATH LAB;  Service: Cardiovascular;  Laterality: Bilateral;   snake bite surgery     TONSILLECTOMY  1955   TOTAL KNEE ARTHROPLASTY  2001       Family History  Problem Relation Age of Onset   Heart disease Mother    Alzheimer's disease Mother    Alzheimer's disease Father    Congestive Heart Failure Father    Multiple sclerosis Sister    Diabetes Maternal Grandmother    Drug abuse Other     Social History   Tobacco Use   Smoking status: Former   Smokeless tobacco: Never   Tobacco comments:    Last smoked 20 years ago  Substance Use Topics   Alcohol use: Not Currently    Comment: Not in 21 days   Drug use: Not Currently    Types: Marijuana    Home Medications Prior to Admission medications   Medication Sig Start Date End Date Taking? Authorizing Provider  albuterol (PROVENTIL) (2.5 MG/3ML) 0.083% nebulizer solution Use 1 vial (2.5 mg total) by nebulization every 6 (six) hours as needed for wheezing or shortness of breath. 04/25/21   Ghimire, Werner Lean, MD  albuterol (VENTOLIN HFA) 108 (90 Base) MCG/ACT inhaler Inhale 2 puffs into the lungs every 6 (six) hours as needed for wheezing or shortness of breath. 04/25/21   Ghimire, Werner Lean, MD  aspirin 81 MG EC tablet Take 1 tablet (81 mg total) by mouth daily. 04/25/21   Ghimire, Werner Lean, MD  atorvastatin (LIPITOR) 80 MG tablet Take 1 tablet (80 mg total) by mouth daily at 6 PM. 04/25/21 07/24/21  Ghimire, Werner Lean, MD  carvedilol (COREG) 6.25 MG tablet Take 0.5 tablets (3.125 mg total) by mouth 2 (two) times daily with a meal. 04/26/21 07/25/21  Ghimire, Werner Lean, MD  cetirizine (ZYRTEC) 10 MG tablet Take 1 tablet (10 mg total) by mouth daily. 04/25/21    Ghimire, Werner Lean, MD  clopidogrel (PLAVIX) 75 MG tablet Take 1 tablet (75 mg total) by mouth daily. 04/25/21 07/24/21  Ghimire, Werner Lean, MD  fluticasone-salmeterol (ADVAIR) 250-50 MCG/ACT AEPB Inhale 1 puff into the lungs in the morning and at bedtime. 04/25/21 05/31/21  Ghimire, Werner Lean, MD  furosemide (LASIX) 40 MG tablet Take 1 tablet (40 mg total) by mouth daily. 04/25/21 07/24/21  Ghimire, Werner Lean, MD  ipratropium (ATROVENT) 0.03 % nasal spray Place 2 sprays into both nostrils every 12 (twelve) hours. 04/25/21   Ghimire, Werner Lean, MD  mirtazapine (REMERON) 7.5 MG tablet Take 1 tablet (7.5 mg total) by mouth at bedtime. 04/25/21 05/25/21  Ghimire, Werner Lean, MD  nitroGLYCERIN (NITROSTAT) 0.4 MG SL tablet Place 1 tablet (0.4 mg total) under the tongue every 5 (  five) minutes x 3 doses as needed for chest pain. 04/25/21   Ghimire, Henreitta Leber, MD  OXYGEN Inhale 5 L/min into the lungs at bedtime as needed (for shortness of breath).    [provider]  pantoprazole (PROTONIX) 40 MG tablet Take 1 tablet (40 mg total) by mouth at bedtime. 04/25/21   Ghimire, Henreitta Leber, MD  polyethylene glycol powder (GLYCOLAX/MIRALAX) 17 GM/SCOOP powder Take 17 g by mouth daily. 04/25/21   Ghimire, Henreitta Leber, MD  Tiotropium Bromide-Olodaterol (STIOLTO RESPIMAT) 2.5-2.5 MCG/ACT AERS INHALE 2 PUFFS BY MOUTH INTO THE LUNGS DAILY 04/25/21   Ghimire, Henreitta Leber, MD    Allergies    Tramadol, Flexeril [cyclobenzaprine], Ibuprofen, Ibuprofen, and Lactose intolerance (gi)  Review of Systems   Review of Systems  Constitutional:  Positive for activity change, appetite change and fatigue. Negative for chills and fever.  HENT:  Positive for congestion. Negative for ear pain and sore throat.   Eyes:  Negative for pain and visual disturbance.  Respiratory:  Positive for cough, chest tightness, shortness of breath and wheezing.   Cardiovascular:  Negative for chest pain and palpitations.  Gastrointestinal:  Negative  for abdominal pain, diarrhea, nausea and vomiting.  Genitourinary:  Negative for dysuria, flank pain and hematuria.  Musculoskeletal:  Negative for arthralgias, back pain, myalgias and neck pain.  Skin:  Negative for color change and rash.  Neurological:  Negative for dizziness, seizures, syncope, weakness, light-headedness and numbness.  Psychiatric/Behavioral:  Negative for confusion and decreased concentration.   All other systems reviewed and are negative.  Physical Exam Updated Vital Signs BP (!) 139/103    Pulse 86    Temp 97.8 F (36.6 C) (Axillary)    Resp 16    SpO2 100%   Physical Exam Vitals and nursing note reviewed.  Constitutional:      General: He is not in acute distress.    Appearance: He is cachectic. He is ill-appearing (Chronically). He is not toxic-appearing or diaphoretic.  HENT:     Head: Normocephalic and atraumatic.     Mouth/Throat:     Mouth: Mucous membranes are moist.     Pharynx: Oropharynx is clear.  Eyes:     Extraocular Movements: Extraocular movements intact.     Conjunctiva/sclera: Conjunctivae normal.  Cardiovascular:     Rate and Rhythm: Normal rate and regular rhythm.     Heart sounds: No murmur heard. Pulmonary:     Effort: Accessory muscle usage present. No tachypnea or respiratory distress.     Breath sounds: Wheezing (End expiratory) present. No decreased breath sounds, rhonchi or rales.  Chest:     Chest wall: No tenderness.  Abdominal:     Palpations: Abdomen is soft.     Tenderness: There is no abdominal tenderness.  Musculoskeletal:        General: No swelling.     Cervical back: Normal range of motion and neck supple.     Right lower leg: No edema.     Left lower leg: No edema.  Skin:    General: Skin is warm and dry.     Coloration: Skin is not cyanotic or pale.  Neurological:     General: No focal deficit present.     Mental Status: He is alert and oriented to person, place, and time.     Cranial Nerves: No cranial nerve  deficit.     Motor: No weakness.  Psychiatric:        Mood and Affect: Mood normal.  Behavior: Behavior normal.    ED Results / Procedures / Treatments   Labs (all labs ordered are listed, but only abnormal results are displayed) Labs Reviewed  COMPREHENSIVE METABOLIC PANEL - Abnormal; Notable for the following components:      Result Value   Glucose, Bld 107 (*)    BUN 24 (*)    Calcium 8.7 (*)    Albumin 3.3 (*)    Alkaline Phosphatase 147 (*)    All other components within normal limits  CBC WITH DIFFERENTIAL/PLATELET - Abnormal; Notable for the following components:   RBC 3.87 (*)    Hemoglobin 11.4 (*)    HCT 37.7 (*)    RDW 16.2 (*)    Neutro Abs 8.5 (*)    All other components within normal limits  BLOOD GAS, VENOUS - Abnormal; Notable for the following components:   pO2, Ven <31.0 (*)    Bicarbonate 31.2 (*)    Acid-Base Excess 4.9 (*)    All other components within normal limits  BRAIN NATRIURETIC PEPTIDE - Abnormal; Notable for the following components:   B Natriuretic Peptide 3,653.6 (*)    All other components within normal limits  RESP PANEL BY RT-PCR (FLU A&B, COVID) ARPGX2  MAGNESIUM    EKG None  Radiology CT Angio Chest PE W and/or Wo Contrast  Result Date: 06/22/2021 CLINICAL DATA:  Shortness of breath and chest tightness. EXAM: CT ANGIOGRAPHY CHEST WITH CONTRAST TECHNIQUE: Multidetector CT imaging of the chest was performed using the standard protocol during bolus administration of intravenous contrast. Multiplanar CT image reconstructions and MIPs were obtained to evaluate the vascular anatomy. CONTRAST:  21mL OMNIPAQUE IOHEXOL 350 MG/ML SOLN COMPARISON:  April 22, 2021 FINDINGS: Cardiovascular: There is marked severity calcification of the thoracic aorta with stable, approximately 4.0 cm aneurysmal dilatation of a short segment of the ascending aortic arch. A stable 3.4 cm x 2.6 cm saccular aneurysm of the anterolateral aspect of the distal  descending thoracic aorta is noted. Satisfactory opacification of the pulmonary arteries to the segmental level. No evidence of pulmonary embolism. There is mild cardiomegaly with marked severity coronary artery calcification. No pericardial effusion. Mediastinum/Nodes: No enlarged mediastinal, hilar, or axillary lymph nodes. The thyroid gland and trachea demonstrate no significant findings. Fluid and heterogeneous material is again seen throughout the lumen of a mildly distended esophagus. Lungs/Pleura: Moderate to marked severity emphysematous lung disease is seen. There are moderate to marked severity multifocal infiltrates involving the bilateral upper lobes and bilateral lower lobes. This is increased in severity when compared to the prior study. Moderate to large bilateral pleural effusions are seen. These are increased in size when compared to the prior study. Multiple bilateral loculated components are noted. No pneumothorax is identified. Upper Abdomen: Multiple cystic appearing areas of various sizes are seen within the anterior aspect of the right lobe of the liver, with punctate calcified granulomas seen within the liver and spleen. A 2.5 cm x 2.0 cm exophytic left renal cyst is noted. Musculoskeletal: Multilevel degenerative changes seen throughout the lumbar spine Review of the MIP images confirms the above findings. IMPRESSION: 1. No evidence of pulmonary embolus. 2. Stable aneurysmal dilatation of a short segment of the ascending aortic arch and distal aspect of the descending thoracic aorta. 3. Moderate to marked severity bilateral upper lobe and bilateral lower lobe infiltrates, increased in severity when compared to the prior study. 4. Moderate to large bilateral multiloculated pleural effusions, increased in size when compared to the prior study. 5. Moderate  to marked severity emphysematous lung disease. 6. Multiple hepatic and left renal cysts. Aortic Atherosclerosis (ICD10-I70.0) and Emphysema  (ICD10-J43.9). Electronically Signed   By: Virgina Norfolk M.D.   On: 06/22/2021 20:09   DG Chest Port 1 View  Result Date: 06/22/2021 CLINICAL DATA:  Dyspnea EXAM: PORTABLE CHEST 1 VIEW COMPARISON:  04/25/2021, CT 04/22/2021 FINDINGS: Emphysema. Small left pleural effusion. Moderate loculated appearing right pleural effusion. Enlarged cardiomediastinal silhouette with vascular congestion and diffuse interstitial and ground-glass opacities suspicious for pulmonary edema. Aortic atherosclerosis. Patchy airspace disease at left lung apex. No pneumothorax. IMPRESSION: 1. Cardiomegaly with vascular congestion and diffuse interstitial and ground-glass opacities suspicious for pulmonary edema. Cardiac size enlarged compared to prior, cannot exclude pericardial effusion. 2. Small left pleural effusion with moderate loculated appearing right pleural effusion 3. Basilar consolidations and patchy airspace disease at left lung base could reflect pneumonia Electronically Signed   By: Donavan Foil M.D.   On: 06/22/2021 18:05    Procedures Procedures   Medications Ordered in ED Medications  ceFEPIme (MAXIPIME) 2 g in sodium chloride 0.9 % 100 mL IVPB (has no administration in time range)  vancomycin (VANCOREADY) IVPB 1250 mg/250 mL (has no administration in time range)  ipratropium-albuterol (DUONEB) 0.5-2.5 (3) MG/3ML nebulizer solution 3 mL (3 mLs Nebulization Given 06/22/21 1657)  methylPREDNISolone sodium succinate (SOLU-MEDROL) 125 mg/2 mL injection 125 mg (125 mg Intravenous Given 06/22/21 1809)  iohexol (OMNIPAQUE) 350 MG/ML injection 80 mL (80 mLs Intravenous Contrast Given 06/22/21 1944)  ceFEPIme (MAXIPIME) 2 g in sodium chloride 0.9 % 100 mL IVPB (0 g Intravenous Stopped 06/22/21 2155)  vancomycin (VANCOCIN) IVPB 1000 mg/200 mL premix (0 mg Intravenous Stopped 06/22/21 2233)    ED Course  I have reviewed the triage vital signs and the nursing notes.  Pertinent labs & imaging results that were  available during my care of the patient were reviewed by me and considered in my medical decision making (see chart for details).    MDM Rules/Calculators/A&P                         75 year old male with history of CAD, COPD, presenting for 2 weeks of worsening shortness of breath, increased sputum which is now green in color, and small flecks of blood present in sputum.  This was in the setting of being out of his home breathing treatments for the past 2 weeks. He is chronically ill-appearing. SpO2 normal on 4L.  On lung auscultation, he has diffuse expiratory wheezing.  Diagnostic work-up was initiated.  Patient was treated for COPD exacerbation with DuoNeb breathing treatment and steroids.  CTA showed multifocal pneumonia with bilateral loculated pleural effusions.  On reassessment, patient is remains alert and oriented with stable vital signs.  Work breathing and lung sounds slightly improved following breathing treatments.  Patient was started on antibiotics and admitted to the hospitalist for further management. CRITICAL CARE Performed by: Godfrey Pick   Total critical care time: 35 minutes  Critical care time was exclusive of separately billable procedures and treating other patients.  Critical care was necessary to treat or prevent imminent or life-threatening deterioration.  Critical care was time spent personally by me on the following activities: development of treatment plan with patient and/or surrogate as well as nursing, discussions with consultants, evaluation of patient's response to treatment, examination of patient, obtaining history from patient or surrogate, ordering and performing treatments and interventions, ordering and review of laboratory studies, ordering and review of  radiographic studies, pulse oximetry and re-evaluation of patient's condition.    Final Clinical Impression(s) / ED Diagnoses Final diagnoses:  COPD exacerbation ([place])  Multifocal pneumonia    Rx /  DC Orders ED Discharge Orders     None        Godfrey Pick, MD 06/23/21 0151

## 2021-06-22 NOTE — H&P (Signed)
Ray HillierHugh M Gardiner ZOX:[id number] DOB: 05-22-1946 DOA: 06/22/2021     PCP: Caesar BookmanNgetich, Dinah C, NP   Outpatient Specialists:   CARDS:  Dr. Anne FuSkains   Patient arrived to ER on 06/22/21 at 1617 Referred by Attending Gloris Manchesterixon, Ryan, MD   Patient coming from: home Lives alone,     Chief Complaint:   Chief Complaint  Patient presents with   Shortness of Breath    HPI: Ray Smith is a 75 y.o. male with medical history significant of CIOD on chronic O2 5 L, CAD,  Systolic CHF, HTN, HLD    Presented with   cough and wheezing SOB, cough wheezing has been out out his home inhalers On home o2 2L no change States he does not smoke any more Has been out of his meds Denies EtOh Reports he is on 5 L o2 at baseline  No CP except when he cough Last admit was in October refused SNF and was dc to home  Has  NOt been vaccinated against COVID no flu shot   Initial COVID TEST  NEGATIVE   Lab Results  Component Value Date   SARSCOV2NAA NEGATIVE 06/22/2021   SARSCOV2NAA NEGATIVE 04/17/2021   SARSCOV2NAA NEGATIVE 11/15/2020   SARSCOV2NAA NEGATIVE 11/02/2020     Regarding pertinent Chronic problems:     Hyperlipidemia -  on statins Lipitor Lipid Panel     Component Value Date/Time   CHOL 157 06/06/2021 1120   TRIG 73 06/06/2021 1120   HDL 63 06/06/2021 1120   CHOLHDL 2.5 06/06/2021 1120   VLDL 17 11/19/2020 1032   LDLCALC 79 06/06/2021 1120     HTN on Coreg   chronic CHF diastolic/systolic/ combined - last echo May 2022 EF 25 to 30% Grade II diastolic  On lasix    CAD  - On Aspirin, statin, betablocker, Plavix                 -  followed by cardiology                - last cardiac cath 10/23/13         COPD -    on baseline oxygen  5 L,  advair, atrovent     Chronic anemia - baseline hg Hemoglobin & Hematocrit  Recent Labs    04/24/21 0806 06/06/21 1120 06/22/21 1801  HGB 11.7* 10.8* 11.4*     While in ER:    Cosistent with COPD exacerbation  Vs PNA Started on  cefepime and vanc    CXR - Cardiomegaly with vascular congestion and diffuse interstitial and ground-glass opacities suspicious for pulmonary edema. Cardiac size enlarged compared to prior, cannot exclude pericardial effusion.   CTA chest -  no PE,  evidence of infiltrate Moderate to large bilateral multiloculated pleural effusions, increased in size when compared to the prior study. 5. Moderate to marked severity emphysematous lung disease  Following Medications were ordered in ER: Medications  ceFEPIme (MAXIPIME) 2 g in sodium chloride 0.9 % 100 mL IVPB (has no administration in time range)  vancomycin (VANCOCIN) IVPB 1000 mg/200 mL premix (has no administration in time range)  ipratropium-albuterol (DUONEB) 0.5-2.5 (3) MG/3ML nebulizer solution 3 mL (3 mLs Nebulization Given 06/22/21 1657)  methylPREDNISolone sodium succinate (SOLU-MEDROL) 125 mg/2 mL injection 125 mg (125 mg Intravenous Given 06/22/21 1809)  iohexol (OMNIPAQUE) 350 MG/ML injection 80 mL (80 mLs Intravenous Contrast Given 06/22/21 1944)    _________    ED Triage Vitals  Enc Vitals  Group     BP 06/22/21 1639 (!) 143/111     Pulse Rate 06/22/21 1639 91     Resp 06/22/21 1639 17     Temp 06/22/21 1639 97.8 F (36.6 C)     Temp Source 06/22/21 1639 Axillary     SpO2 06/22/21 1639 92 %     Weight --      Height --      Head Circumference --      Peak Flow --      Pain Score 06/22/21 1640 9     Pain Loc --      Pain Edu? --      Excl. in [place]? --   TMAX(24)@     _________________________________________ Significant initial  Findings: Abnormal Labs Reviewed  COMPREHENSIVE METABOLIC PANEL - Abnormal; Notable for the following components:      Result Value   Glucose, Bld 107 (*)    BUN 24 (*)    Calcium 8.7 (*)    Albumin 3.3 (*)    Alkaline Phosphatase 147 (*)    All other components within normal limits  CBC WITH DIFFERENTIAL/PLATELET - Abnormal; Notable for the following components:   RBC 3.87 (*)     Hemoglobin 11.4 (*)    HCT 37.7 (*)    RDW 16.2 (*)    Neutro Abs 8.5 (*)    All other components within normal limits  BLOOD GAS, VENOUS - Abnormal; Notable for the following components:   pO2, Ven <31.0 (*)    Bicarbonate 31.2 (*)    Acid-Base Excess 4.9 (*)    All other components within normal limits  BRAIN NATRIURETIC PEPTIDE - Abnormal; Notable for the following components:   B Natriuretic Peptide 3,653.6 (*)    All other components within normal limits     _________________________ Troponin  ordered ECG: Ordered    The recent clinical data is shown below. Vitals:   06/22/21 1900 06/22/21 1915 06/22/21 1930 06/22/21 2030  BP: (!) 142/117 (!) 154/98 (!) 148/106 (!) 140/105  Pulse: 90 87 91 90  Resp: (!) 24 (!) 21 (!) 25 20  Temp:      TempSrc:      SpO2: 100% 100% 100% 100%      WBC     Component Value Date/Time   WBC 10.4 06/22/2021 1801   LYMPHSABS 0.7 06/22/2021 1801   MONOABS 0.9 06/22/2021 1801   EOSABS 0.2 06/22/2021 1801   BASOSABS 0.1 06/22/2021 1801     Procalcitonin   Ordered    Results for orders placed or performed during the hospital encounter of 06/22/21  Resp Panel by RT-PCR (Flu A&B, Covid) Nasopharyngeal Swab     Status: None   Collection Time: 06/22/21  5:11 PM   Specimen: Nasopharyngeal Swab; Nasopharyngeal(NP) swabs in vial transport medium  Result Value Ref Range Status   SARS Coronavirus 2 by RT PCR NEGATIVE NEGATIVE Final         Influenza A by PCR NEGATIVE NEGATIVE Final   Influenza B by PCR NEGATIVE NEGATIVE Final          _______________________________________________ Hospitalist was called for admission for COPD exacerbation / pna, acute on chronic systolic CHF  The following Work up has been ordered so far:  Orders Placed This Encounter  Procedures   Resp Panel by RT-PCR (Flu A&B, Covid) Nasopharyngeal Swab   DG Chest Port 1 View   CT Angio Chest PE W and/or Wo Contrast   Comprehensive metabolic panel  CBC WITH  DIFFERENTIAL   Blood gas, venous   Brain natriuretic peptide   Magnesium   Initiate Carrier Fluid Protocol   Height and weight   Consult to hospitalist     OTHER Significant initial  Findings:  labs showing:    Recent Labs  Lab 06/22/21 1801  NA 139  K 3.9  CO2 29  GLUCOSE 107*  BUN 24*  CREATININE 1.02  CALCIUM 8.7*  MG 2.4    Cr   stable,    Lab Results  Component Value Date   CREATININE 1.02 06/22/2021   CREATININE 0.83 06/06/2021   CREATININE 1.19 04/24/2021    Recent Labs  Lab 06/22/21 1801  AST 24  ALT 12  ALKPHOS 147*  BILITOT 0.7  PROT 7.1  ALBUMIN 3.3*   Lab Results  Component Value Date   CALCIUM 8.7 (L) 06/22/2021   PHOS 2.1 (L) 04/23/2021          Plt: Lab Results  Component Value Date   PLT 271 06/22/2021       Venous  Blood Gas result:  pH 7.354  pCO2  57.6.  ABG    Component Value Date/Time   PHART 7.507 (H) 01/18/2018 1230   PCO2ART 24.8 (L) 01/18/2018 1230   PO2ART 61.4 (L) 01/18/2018 1230   HCO3 31.2 (H) 06/22/2021 1801   TCO2 21 (L) 12/30/2017 0220   ACIDBASEDEF 3.1 (H) 01/18/2018 1230   O2SAT 22.1 06/22/2021 1801    Recent Labs  Lab 06/22/21 1801  WBC 10.4  NEUTROABS 8.5*  HGB 11.4*  HCT 37.7*  MCV 97.4  PLT 271    HG/HCT  stable,      Component Value Date/Time   HGB 11.4 (L) 06/22/2021 1801   HCT 37.7 (L) 06/22/2021 1801   MCV 97.4 06/22/2021 1801       .car BNP (last 3 results) Recent Labs    11/15/20 1150 04/17/21 2100 06/22/21 1801  BNP 3,315.3* 1,851.3* 3,653.6*      DM  labs:  HbA1C: Recent Labs    11/19/20 1051  HGBA1C 5.6       CBG (last 3)  No results for input(s): GLUCAP in the last 72 hours.        Cultures:    Component Value Date/Time   SDES BLOOD RIGHT ARM 04/17/2021 2100   SDES BLOOD RIGHT ANTECUBITAL 04/17/2021 2100   SPECREQUEST  04/17/2021 2100    BOTTLES DRAWN AEROBIC AND ANAEROBIC Blood Culture adequate volume   SPECREQUEST AEROBIC BOTTLE ONLY Blood Culture  adequate volume 04/17/2021 2100   CULT  04/17/2021 2100    NO GROWTH 5 DAYS Performed at [facility], [place] [address] [place], [place] [address]    CULT  04/17/2021 2100    NO GROWTH 5 DAYS Performed at [facility] [address], [place], [place] [address]    REPTSTATUS 04/22/2021 FINAL 04/17/2021 2100   REPTSTATUS 04/22/2021 FINAL 04/17/2021 2100     Radiological Exams on Admission: CT Angio Chest PE W and/or Wo Contrast  Result Date: 06/22/2021 CLINICAL DATA:  Shortness of breath and chest tightness. EXAM: CT ANGIOGRAPHY CHEST WITH CONTRAST TECHNIQUE: Multidetector CT imaging of the chest was performed using the standard protocol during bolus administration of intravenous contrast. Multiplanar CT image reconstructions and MIPs were obtained to evaluate the vascular anatomy. CONTRAST:  83mL OMNIPAQUE IOHEXOL 350 MG/ML SOLN COMPARISON:  April 22, 2021 FINDINGS: Cardiovascular: There is marked severity calcification of the thoracic aorta with  stable, approximately 4.0 cm aneurysmal dilatation of a short segment of the ascending aortic arch. A stable 3.4 cm x 2.6 cm saccular aneurysm of the anterolateral aspect of the distal descending thoracic aorta is noted. Satisfactory opacification of the pulmonary arteries to the segmental level. No evidence of pulmonary embolism. There is mild cardiomegaly with marked severity coronary artery calcification. No pericardial effusion. Mediastinum/Nodes: No enlarged mediastinal, hilar, or axillary lymph nodes. The thyroid gland and trachea demonstrate no significant findings. Fluid and heterogeneous material is again seen throughout the lumen of a mildly distended esophagus. Lungs/Pleura: Moderate to marked severity emphysematous lung disease is seen. There are moderate to marked severity multifocal infiltrates involving the bilateral upper lobes and bilateral lower lobes. This is increased in severity when compared to the prior study. Moderate to  large bilateral pleural effusions are seen. These are increased in size when compared to the prior study. Multiple bilateral loculated components are noted. No pneumothorax is identified. Upper Abdomen: Multiple cystic appearing areas of various sizes are seen within the anterior aspect of the right lobe of the liver, with punctate calcified granulomas seen within the liver and spleen. A 2.5 cm x 2.0 cm exophytic left renal cyst is noted. Musculoskeletal: Multilevel degenerative changes seen throughout the lumbar spine Review of the MIP images confirms the above findings. IMPRESSION: 1. No evidence of pulmonary embolus. 2. Stable aneurysmal dilatation of a short segment of the ascending aortic arch and distal aspect of the descending thoracic aorta. 3. Moderate to marked severity bilateral upper lobe and bilateral lower lobe infiltrates, increased in severity when compared to the prior study. 4. Moderate to large bilateral multiloculated pleural effusions, increased in size when compared to the prior study. 5. Moderate to marked severity emphysematous lung disease. 6. Multiple hepatic and left renal cysts. Aortic Atherosclerosis (ICD10-I70.0) and Emphysema (ICD10-J43.9). Electronically Signed   By: Virgina Norfolk M.D.   On: 06/22/2021 20:09   DG Chest Port 1 View  Result Date: 06/22/2021 CLINICAL DATA:  Dyspnea EXAM: PORTABLE CHEST 1 VIEW COMPARISON:  04/25/2021, CT 04/22/2021 FINDINGS: Emphysema. Small left pleural effusion. Moderate loculated appearing right pleural effusion. Enlarged cardiomediastinal silhouette with vascular congestion and diffuse interstitial and ground-glass opacities suspicious for pulmonary edema. Aortic atherosclerosis. Patchy airspace disease at left lung apex. No pneumothorax. IMPRESSION: 1. Cardiomegaly with vascular congestion and diffuse interstitial and ground-glass opacities suspicious for pulmonary edema. Cardiac size enlarged compared to prior, cannot exclude pericardial  effusion. 2. Small left pleural effusion with moderate loculated appearing right pleural effusion 3. Basilar consolidations and patchy airspace disease at left lung base could reflect pneumonia Electronically Signed   By: Donavan Foil M.D.   On: 06/22/2021 18:05   _______________________________________________________________________________________________________ Latest  Blood pressure (!) 140/105, pulse 90, temperature 97.8 F (36.6 C), temperature source Axillary, resp. rate 20, SpO2 100 %.   Vitals  labs and radiology finding personally reviewed  Review of Systems:    Pertinent positives include:  chills, fatigue,shortness of breath at rest. dyspnea on exertion excess mucus productive cough,coughing up of blood.wheezing. Constitutional:  No weight loss, night sweats, Fevers,  weight loss  HEENT:  No headaches, Difficulty swallowing,Tooth/dental problems,Sore throat,  No sneezing, itching, ear ache, nasal congestion, post nasal drip,  Cardio-vascular:  No chest pain, Orthopnea, PND, anasarca, dizziness, palpitations.no Bilateral lower extremity swelling  GI:  No heartburn, indigestion, abdominal pain, nausea, vomiting, diarrhea, change in bowel habits, loss of appetite, melena, blood in stool, hematemesis Resp:    No non-productive cough,  Skin:  no rash or lesions. No jaundice GU:  no dysuria, change in color of urine, no urgency or frequency. No straining to urinate.  No flank pain.  Musculoskeletal:  No joint pain or no joint swelling. No decreased range of motion. No back pain.  Psych:  No change in mood or affect. No depression or anxiety. No memory loss.  Neuro: no localizing neurological complaints, no tingling, no weakness, no double vision, no gait abnormality, no slurred speech, no confusion  All systems reviewed and apart from HOPI all are negative _______________________________________________________________________________________________ Past Medical  History:   Past Medical History:  Diagnosis Date   Acute ST elevation myocardial infarction (STEMI) involving left anterior descending (LAD) coronary artery (HCC) 12/30/2017   Acute systolic heart failure (HCC) 12/30/2017   Congestive heart failure (CHF) (HCC)    COPD (chronic obstructive pulmonary disease) (HCC)    Coronary artery disease    a.  presented with CP and inf STE but no ACS - LHC (10/23/13):  Dist LM 40-60%, ostial LAD 80%, mid LAD 50%, ostial CFX 50-70%, mid RCA 50%.  EF 60%. - CP not felt to be ischemic; CABG vs Med Rx d/w pt - pt opted for Med Rx   History of kidney stones    "more than 20" (01/09/2018)   Hyperlipidemia    Hypertension    Marijuana abuse    MI (mitral incompetence)    Tobacco dependence       Past Surgical History:  Procedure Laterality Date   CARDIAC CATHETERIZATION     CORONARY/GRAFT ACUTE MI REVASCULARIZATION N/A 12/30/2017   Procedure: Coronary/Graft Acute MI Revascularization;  Surgeon: Tonny Bollman, MD;  Location: [facility] INVASIVE CV LAB;  Service: Cardiovascular;  Laterality: N/A;  Distal LM into LAD Xience [place] 3.5x89mm Ostial Cx Xience [place] 3.5x44mm   LEFT HEART CATH AND CORONARY ANGIOGRAPHY N/A 12/30/2017   Procedure: LEFT HEART CATH AND CORONARY ANGIOGRAPHY;  Surgeon: Tonny Bollman, MD;  Location: [facility] INVASIVE CV LAB;  Service: Cardiovascular;  Laterality: N/A;   LEFT HEART CATHETERIZATION WITH CORONARY ANGIOGRAM Bilateral 10/23/2013   Procedure: LEFT HEART CATHETERIZATION WITH CORONARY ANGIOGRAM;  Surgeon: Lesleigh Noe, MD;  Location: [facility] CATH LAB;  Service: Cardiovascular;  Laterality: Bilateral;   snake bite surgery     TONSILLECTOMY  1955   TOTAL KNEE ARTHROPLASTY  2001    Social History:  Ambulatory   independently       reports that he has quit smoking. He has never used smokeless tobacco. He reports that he does not currently use alcohol. He reports that he does not currently use drugs after having used the following drugs:  Marijuana.   Family History:   Family History  Problem Relation Age of Onset   Heart disease Mother    Alzheimer's disease Mother    Alzheimer's disease Father    Congestive Heart Failure Father    Multiple sclerosis Sister    Diabetes Maternal Grandmother    Drug abuse Other    ______________________________________________________________________________________________ Allergies: Allergies  Allergen Reactions   Tramadol Anaphylaxis   Flexeril [Cyclobenzaprine] Other (See Comments)    Per patient "it made my heart stop"   Ibuprofen Other (See Comments)    Overuse damages pt's kidneys    Ibuprofen Nausea Only and Other (See Comments)    Reaction not recalled- perhaps made his stomach hurt   Lactose Intolerance (Gi) Other (See Comments)    Digestive issues     Prior to Admission medications   Medication  Sig Start Date End Date Taking? Authorizing Provider  albuterol (PROVENTIL) (2.5 MG/3ML) 0.083% nebulizer solution Use 1 vial (2.5 mg total) by nebulization every 6 (six) hours as needed for wheezing or shortness of breath. 04/25/21   Ghimire, Werner Lean, MD  albuterol (VENTOLIN HFA) 108 (90 Base) MCG/ACT inhaler Inhale 2 puffs into the lungs every 6 (six) hours as needed for wheezing or shortness of breath. 04/25/21   Ghimire, Werner Lean, MD  aspirin 81 MG EC tablet Take 1 tablet (81 mg total) by mouth daily. 04/25/21   Ghimire, Werner Lean, MD  atorvastatin (LIPITOR) 80 MG tablet Take 1 tablet (80 mg total) by mouth daily at 6 PM. 04/25/21 07/24/21  Ghimire, Werner Lean, MD  carvedilol (COREG) 6.25 MG tablet Take 0.5 tablets (3.125 mg total) by mouth 2 (two) times daily with a meal. 04/26/21 07/25/21  Ghimire, Werner Lean, MD  cetirizine (ZYRTEC) 10 MG tablet Take 1 tablet (10 mg total) by mouth daily. 04/25/21   Ghimire, Werner Lean, MD  clopidogrel (PLAVIX) 75 MG tablet Take 1 tablet (75 mg total) by mouth daily. 04/25/21 07/24/21  Ghimire, Werner Lean, MD  fluticasone-salmeterol (ADVAIR)  250-50 MCG/ACT AEPB Inhale 1 puff into the lungs in the morning and at bedtime. 04/25/21 05/31/21  Ghimire, Werner Lean, MD  furosemide (LASIX) 40 MG tablet Take 1 tablet (40 mg total) by mouth daily. 04/25/21 07/24/21  Ghimire, Werner Lean, MD  ipratropium (ATROVENT) 0.03 % nasal spray Place 2 sprays into both nostrils every 12 (twelve) hours. 04/25/21   Ghimire, Werner Lean, MD  mirtazapine (REMERON) 7.5 MG tablet Take 1 tablet (7.5 mg total) by mouth at bedtime. 04/25/21 05/25/21  Ghimire, Werner Lean, MD  nitroGLYCERIN (NITROSTAT) 0.4 MG SL tablet Place 1 tablet (0.4 mg total) under the tongue every 5 (five) minutes x 3 doses as needed for chest pain. 04/25/21   Ghimire, Werner Lean, MD  OXYGEN Inhale 5 L/min into the lungs at bedtime as needed (for shortness of breath).    [provider]  pantoprazole (PROTONIX) 40 MG tablet Take 1 tablet (40 mg total) by mouth at bedtime. 04/25/21   Ghimire, Werner Lean, MD  polyethylene glycol powder (GLYCOLAX/MIRALAX) 17 GM/SCOOP powder Take 17 g by mouth daily. 04/25/21   Ghimire, Werner Lean, MD  Tiotropium Bromide-Olodaterol (STIOLTO RESPIMAT) 2.5-2.5 MCG/ACT AERS INHALE 2 PUFFS BY MOUTH INTO THE LUNGS DAILY 04/25/21   Maretta Bees, MD    ___________________________________________________________________________________________________ Physical Exam: Vitals with BMI 06/22/2021 06/22/2021 06/22/2021  Height - - -  Weight - - -  BMI - - -  Systolic 140 148 829  Diastolic 105 106 98  Pulse 90 91 87     1. General:  in No  Acute distress    Chronically ill   -appearing 2. Psychological: Alert and   Oriented 3. Head/ENT:    moist  Mucous Membranes                          Head Non traumatic, neck supple                          Poor Dentition 4. SKIN: normal  Skin turgor,  Skin clean Dry and intact no rash 5. Heart: Regular rate and rhythm no  Murmur, no Rub or gallop 6. Lungs:occasional  wheezes or crackles   7. Abdomen: Soft,  non-tender,  Non distended bowel sounds present 8. Lower extremities:  no clubbing, cyanosis, no  edema 9. Neurologically Grossly intact, moving all 4 extremities equally   10. MSK: Normal range of motion    Chart has been reviewed  ______________________________________________________________________________________________  Assessment/Plan 75 y.o. male with medical history significant of CIOD on chronic O2 5 L, CAD,  Systolic CHF, HTN, HLD   Admitted for  COPD exacerbation / pna, acute on chronic systolic CHF  Present on Admission:  CAP (community acquired pneumonia)  CKD (chronic kidney disease) stage 2, GFR 60-89 ml/min  Coronary Artery Disease  Dyslipidemia  Hypertension  Acute systolic heart failure (HCC)  Protein-calorie malnutrition, severe  COPD with acute exacerbation (HCC)  Coronary artery disease  Acute on chronic respiratory failure with hypoxia (HCC)     CKD (chronic kidney disease) stage 2, GFR 60-89 ml/min  -chronic avoid nephrotoxic medications such as NSAIDs, Vanco Zosyn combo,  avoid hypotension, continue to follow renal function   Acute systolic heart failure ([place]) - Pt diagnosed with CHF based on presence of the following:   rales on exam,   Pulmonary edema on CXR DOE  pleural effusion  With noted response to IV diuretic in ER  admit on telemetry,  cycle cardiac enzymes, Troponin    obtain serial ECG  to evaluate for ischemia as a cause of heart failure  monitor daily weight: There were no vitals filed for this visit. Last BNP BNP (last 3 results) Recent Labs    11/15/20 1150 04/17/21 2100 06/22/21 1801  BNP 3,315.3* 1,851.3* 3,653.6*     diurese with IV lasix and monitor orthostatics and creatinine to avoid over diuresis.  Order echogram to evaluate EF and valves     CAP (community acquired pneumonia)  - -Patient presenting with  productive cough, fever hypoxia , and infiltrate  on chest x-ray -Infiltrate on CXR and 2-3 characte     will admit for  treatment of CAP will start on appropriate antibiotic coverage. - started on cefepime/ vanc given COPD and recent admit   Obtain:  sputum cultures,                 Obtain respiratory panel                  influenza serologies negative                  COVID PCR negative                    blood cultures and sputum cultures ordered                   strep pneumo UA antigen,                   check for Legionella antigen.                Provide oxygen as needed.     Coronary Artery Disease  - chronic, continue aspirin  and statin and beta blocker  On Plavix   Dyslipidemia Continue lipitor  Hypertension Cont Coreg  Acute on chronic respiratory failure with hypoxia (HCC)  this patient has acute respiratory failure with Hypoxia as   Noted to be hypoxic down to 89% on 4L baseline Likely due to  Pneumonia, CHF exacerbation, COPD exacerbation,   Provide O2 therapy and titrate as needed  Continuous pulse ox   check Pulse ox with ambulation prior to discharge    flutter valve ordered   Protein-calorie malnutrition, severe Order nutritional  consult  COPD with acute exacerbation ([place])  -  - Will initiate: Steroid taper  -  Antibiotics   - Albuterol  PRN, - scheduled duoneb,  -  Breo or Dulera at discharge   -  Mucinex.  Titrate O2 to saturation >90%. Follow patients respiratory status.   nfluenza PCR neg   VBG no severe hypercabia    Currently mentating well no evidence of symptomatic hypercarbia    Other plan as per orders.  DVT prophylaxis:  SCD      Code Status:    Code Status: Prior FULL CODE as per patient   I had personally discussed CODE STATUS with patient      Family Communication:   Family not at  Bedside    Disposition Plan:     likely will need placement for rehabilitation                      Following barriers for discharge:                            Electrolytes corrected  Hypoxia improved                     Would benefit from PT/OT eval  prior to DC  Ordered                                      Nutrition    consulted                                    Consults called: none   Admission status:  ED Disposition     ED Disposition  [facility]: [facility] [[id number]]  Level of Care: Progressive [102]  Admit to Progressive based on following criteria: CARDIOVASCULAR & THORACIC of moderate stability with acute coronary syndrome symptoms/low risk myocardial infarction/hypertensive urgency/arrhythmias/heart failure potentially compromising stability and stable post cardiovascular intervention patients.  Admit to Progressive based on following criteria: RESPIRATORY PROBLEMS hypoxemic/hypercapnic respiratory failure that is responsive to NIPPV (BiPAP) or High Flow Nasal Cannula (6-80 lpm). Frequent assessment/intervention, no > Q2 hrs < Q4 hrs, to maintain oxygenation and pulmonary hygiene.  May admit patient to [facility] or [facility] if equivalent level of care is available:: No  Covid Evaluation: Confirmed COVID Negative  Diagnosis: CAP (community acquired pneumonia) [id number]  Admitting Physician: Toy Baker [[id number]]  Attending Physician: Toy Baker [[id number]]  Estimated length of stay: past midnight tomorrow  Certification:: I certify this patient will need inpatient services for at least 2 midnights            inpatient     I Expect 2 midnight stay secondary to severity of patient's current illness need for inpatient interventions justified by the following:  hemodynamic instability despite optimal treatment (  hypoxia, )  Severe lab/radiological/exam abnormalities including:    CAP and extensive comorbidities including:    CHF   CAD   COPD/asthma   CKD    That are currently affecting medical management.   I expect  patient to be hospitalized for 2 midnights requiring inpatient medical care.  Patient is at high risk for adverse outcome  (such as loss of life  or disability) if not treated.  Indication for inpatient stay as follows:   Hemodynamic instability despite maximal medical therapy,     New or worsening hypoxia   Need for IV antibiotics       Level of care        progressive tele indefinitely please discontinue once patient no longer qualifies COVID-19 Labs    Lab Results  Component Value Date   [place] 06/22/2021     Precautions: admitted as  Covid Negative        [place] 06/23/2021, 2:38 AM    [facility]     after 2 AM please page floor coverage PA If 7AM-7PM, please contact the day team taking care of the patient using [email or web address]   Patient was evaluated in the context of the global COVID-19 pandemic, which necessitated consideration that the patient might be at risk for infection with the SARS-CoV-2 virus that causes COVID-19. Institutional protocols and algorithms that pertain to the evaluation of patients at risk for COVID-19 are in a state of rapid change based on information released by regulatory bodies including the CDC and federal and state organizations. These policies and algorithms were followed during the patient's care.

## 2021-06-22 NOTE — Progress Notes (Signed)
Pharmacy Antibiotic Note  Ray Smith is a 75 y.o. male admitted on 06/22/2021 with history of CHF, COPD (on supplemental oxygen at baseline), CAD, HTN, HLD, presenting for shortness of breath and hemoptysis.  Pharmacy has been consulted to dose vancomycin and cefepime for pna.  1st doses given in ED  Plan: Vancomycin 1250mg  IV q48h (AUC 522.1, TBW, Scr 1.02) Cefepime 2gm IV q12h Follow renal function, cultures and clinical course     Temp (24hrs), Avg:97.8 F (36.6 C), Min:97.8 F (36.6 C), Max:97.8 F (36.6 C)  Recent Labs  Lab 06/22/21 1801  WBC 10.4  CREATININE 1.02    CrCl cannot be calculated (Unknown ideal weight.).    Allergies  Allergen Reactions   Tramadol Anaphylaxis   Flexeril [Cyclobenzaprine] Other (See Comments)    Per patient "it made my heart stop"   Ibuprofen Other (See Comments)    Overuse damages pt's kidneys    Ibuprofen Nausea Only and Other (See Comments)    Reaction not recalled- perhaps made his stomach hurt   Lactose Intolerance (Gi) Other (See Comments)    Digestive issues    Antimicrobials this admission: 12/28 vanc >> 12/28 cefepime >>  Dose adjustments this admission:   Microbiology results:   Thank you for allowing pharmacy to be a part of this patients care.  1/29 RPh 06/22/2021, 11:15 PM

## 2021-06-23 ENCOUNTER — Inpatient Hospital Stay (HOSPITAL_COMMUNITY): Payer: 59

## 2021-06-23 DIAGNOSIS — J9621 Acute and chronic respiratory failure with hypoxia: Secondary | ICD-10-CM | POA: Diagnosis present

## 2021-06-23 DIAGNOSIS — J90 Pleural effusion, not elsewhere classified: Secondary | ICD-10-CM | POA: Diagnosis present

## 2021-06-23 DIAGNOSIS — R0609 Other forms of dyspnea: Secondary | ICD-10-CM | POA: Diagnosis not present

## 2021-06-23 DIAGNOSIS — Z91199 Patient's noncompliance with other medical treatment and regimen due to unspecified reason: Secondary | ICD-10-CM | POA: Diagnosis present

## 2021-06-23 LAB — CBC WITH DIFFERENTIAL/PLATELET
Abs Immature Granulocytes: 0.07 10*3/uL (ref 0.00–0.07)
Basophils Absolute: 0 10*3/uL (ref 0.0–0.1)
Basophils Relative: 0 %
Eosinophils Absolute: 0 10*3/uL (ref 0.0–0.5)
Eosinophils Relative: 0 %
HCT: 37.9 % — ABNORMAL LOW (ref 39.0–52.0)
Hemoglobin: 11.6 g/dL — ABNORMAL LOW (ref 13.0–17.0)
Immature Granulocytes: 1 %
Lymphocytes Relative: 4 %
Lymphs Abs: 0.2 10*3/uL — ABNORMAL LOW (ref 0.7–4.0)
MCH: 30.1 pg (ref 26.0–34.0)
MCHC: 30.6 g/dL (ref 30.0–36.0)
MCV: 98.2 fL (ref 80.0–100.0)
Monocytes Absolute: 0.2 10*3/uL (ref 0.1–1.0)
Monocytes Relative: 2 %
Neutro Abs: 6.4 10*3/uL (ref 1.7–7.7)
Neutrophils Relative %: 93 %
Platelets: 247 10*3/uL (ref 150–400)
RBC: 3.86 MIL/uL — ABNORMAL LOW (ref 4.22–5.81)
RDW: 16.2 % — ABNORMAL HIGH (ref 11.5–15.5)
WBC: 6.9 10*3/uL (ref 4.0–10.5)
nRBC: 0 % (ref 0.0–0.2)

## 2021-06-23 LAB — ECHOCARDIOGRAM COMPLETE
AR max vel: 1.27 cm2
AV Area VTI: 1.32 cm2
AV Area mean vel: 1.41 cm2
AV Mean grad: 14 mmHg
AV Peak grad: 27.2 mmHg
Ao pk vel: 2.61 m/s
Calc EF: 20 %
S' Lateral: 4 cm
Single Plane A2C EF: 13.9 %
Single Plane A4C EF: 25.1 %

## 2021-06-23 LAB — COMPREHENSIVE METABOLIC PANEL
ALT: 11 U/L (ref 0–44)
AST: 20 U/L (ref 15–41)
Albumin: 2.8 g/dL — ABNORMAL LOW (ref 3.5–5.0)
Alkaline Phosphatase: 124 U/L (ref 38–126)
Anion gap: 7 (ref 5–15)
BUN: 27 mg/dL — ABNORMAL HIGH (ref 8–23)
CO2: 26 mmol/L (ref 22–32)
Calcium: 8.6 mg/dL — ABNORMAL LOW (ref 8.9–10.3)
Chloride: 105 mmol/L (ref 98–111)
Creatinine, Ser: 0.98 mg/dL (ref 0.61–1.24)
GFR, Estimated: >60 mL/min (ref >60)
Glucose, Bld: 161 mg/dL — ABNORMAL HIGH (ref 70–99)
Potassium: 4 mmol/L (ref 3.5–5.1)
Sodium: 138 mmol/L (ref 135–145)
Total Bilirubin: 0.6 mg/dL (ref 0.3–1.2)
Total Protein: 6 g/dL — ABNORMAL LOW (ref 6.5–8.1)

## 2021-06-23 LAB — TSH: TSH: 0.618 u[IU]/mL (ref 0.350–4.500)

## 2021-06-23 LAB — TROPONIN I (HIGH SENSITIVITY)
Troponin I (High Sensitivity): 39 ng/L — ABNORMAL HIGH (ref <18)
Troponin I (High Sensitivity): 32 ng/L — ABNORMAL HIGH (ref <18)

## 2021-06-23 LAB — SEDIMENTATION RATE: Sed Rate: 10 mm/hr (ref 0–16)

## 2021-06-23 LAB — MAGNESIUM: Magnesium: 2.4 mg/dL (ref 1.7–2.4)

## 2021-06-23 LAB — STREP PNEUMONIAE URINARY ANTIGEN: Strep Pneumo Urinary Antigen: NEGATIVE

## 2021-06-23 LAB — C-REACTIVE PROTEIN: CRP: 1.7 mg/dL — ABNORMAL HIGH (ref <1.0)

## 2021-06-23 LAB — PHOSPHORUS: Phosphorus: 3.2 mg/dL (ref 2.5–4.6)

## 2021-06-23 MED ORDER — ACETAMINOPHEN 650 MG RE SUPP: 650.0000 mg | Freq: Four times a day (QID) | RECTAL | Status: DC | PRN

## 2021-06-23 MED ORDER — ACETAMINOPHEN 325 MG PO TABS: 650.0000 mg | ORAL_TABLET | Freq: Four times a day (QID) | ORAL | Status: DC | PRN

## 2021-06-23 MED ORDER — SODIUM CHLORIDE 0.9 % IV SOLN: 250.0000 mL | INTRAVENOUS | Status: DC | PRN

## 2021-06-23 MED ORDER — CLOPIDOGREL BISULFATE 75 MG PO TABS
75.0000 mg | ORAL_TABLET | Freq: Every day | ORAL | Status: DC
  Administered 2021-06-23 – 2021-06-25 (×3): 75 mg via ORAL
  Filled 2021-06-23 (×3): qty 1

## 2021-06-23 MED ORDER — PANTOPRAZOLE SODIUM 40 MG PO TBEC
40.0000 mg | DELAYED_RELEASE_TABLET | Freq: Every day | ORAL | Status: DC
  Administered 2021-06-23 – 2021-06-24 (×2): 40 mg via ORAL
  Filled 2021-06-23 (×2): qty 1

## 2021-06-23 MED ORDER — IPRATROPIUM-ALBUTEROL 0.5-2.5 (3) MG/3ML IN SOLN
3.0000 mL | Freq: Four times a day (QID) | RESPIRATORY_TRACT | Status: DC
  Administered 2021-06-23 (×4): 3 mL via RESPIRATORY_TRACT
  Filled 2021-06-23 (×4): qty 3

## 2021-06-23 MED ORDER — ATORVASTATIN CALCIUM 40 MG PO TABS
80.0000 mg | ORAL_TABLET | Freq: Every day | ORAL | Status: DC
  Administered 2021-06-23 – 2021-06-24 (×2): 80 mg via ORAL
  Filled 2021-06-23 (×2): qty 2

## 2021-06-23 MED ORDER — CARVEDILOL 3.125 MG PO TABS
3.1250 mg | ORAL_TABLET | Freq: Two times a day (BID) | ORAL | Status: DC
  Administered 2021-06-23 – 2021-06-25 (×5): 3.125 mg via ORAL
  Filled 2021-06-23 (×5): qty 1

## 2021-06-23 MED ORDER — METHYLPREDNISOLONE SODIUM SUCC 40 MG IJ SOLR
40.0000 mg | Freq: Every day | INTRAMUSCULAR | Status: DC
  Administered 2021-06-23 – 2021-06-25 (×3): 40 mg via INTRAVENOUS
  Filled 2021-06-23 (×3): qty 1

## 2021-06-23 MED ORDER — SODIUM CHLORIDE 0.9% FLUSH
3.0000 mL | Freq: Two times a day (BID) | INTRAVENOUS | Status: DC
  Administered 2021-06-23 – 2021-06-25 (×5): 3 mL via INTRAVENOUS

## 2021-06-23 MED ORDER — MIRTAZAPINE 15 MG PO TABS
7.5000 mg | ORAL_TABLET | Freq: Every day | ORAL | Status: DC
  Filled 2021-06-23: qty 1

## 2021-06-23 MED ORDER — DOXYCYCLINE HYCLATE 100 MG PO TABS
100.0000 mg | ORAL_TABLET | Freq: Two times a day (BID) | ORAL | Status: DC
  Administered 2021-06-23 – 2021-06-25 (×5): 100 mg via ORAL
  Filled 2021-06-23 (×5): qty 1

## 2021-06-23 MED ORDER — FUROSEMIDE 10 MG/ML IJ SOLN
40.0000 mg | Freq: Two times a day (BID) | INTRAMUSCULAR | Status: DC
  Administered 2021-06-23 – 2021-06-24 (×2): 40 mg via INTRAVENOUS
  Filled 2021-06-23 (×4): qty 4

## 2021-06-23 MED ORDER — ASPIRIN EC 81 MG PO TBEC
81.0000 mg | DELAYED_RELEASE_TABLET | Freq: Every day | ORAL | Status: DC
  Administered 2021-06-23 – 2021-06-25 (×3): 81 mg via ORAL
  Filled 2021-06-23 (×3): qty 1

## 2021-06-23 MED ORDER — GUAIFENESIN ER 600 MG PO TB12
600.0000 mg | ORAL_TABLET | Freq: Two times a day (BID) | ORAL | Status: DC
  Administered 2021-06-23 – 2021-06-25 (×6): 600 mg via ORAL
  Filled 2021-06-23 (×6): qty 1

## 2021-06-23 MED ORDER — PREDNISONE 20 MG PO TABS: 40.0000 mg | ORAL_TABLET | Freq: Every day | ORAL | Status: DC

## 2021-06-23 MED ORDER — SODIUM CHLORIDE 0.9% FLUSH: 3.0000 mL | INTRAVENOUS | Status: DC | PRN

## 2021-06-23 MED ORDER — METHYLPREDNISOLONE SODIUM SUCC 40 MG IJ SOLR
40.0000 mg | INTRAMUSCULAR | Status: AC
  Administered 2021-06-23: 06:00:00 40 mg via INTRAVENOUS
  Filled 2021-06-23: qty 1

## 2021-06-23 MED ORDER — ALBUTEROL SULFATE (2.5 MG/3ML) 0.083% IN NEBU: 2.5000 mg | INHALATION_SOLUTION | RESPIRATORY_TRACT | Status: DC | PRN

## 2021-06-23 NOTE — Assessment & Plan Note (Signed)
Blood pressure relatively stable.  Monitor.

## 2021-06-23 NOTE — Assessment & Plan Note (Addendum)
CT scan shows evidence of loculated pleural effusion more on the right. Patient did have pleural effusion in the past but they were not located. This requires thoracentesis for further evaluation especially in the setting of worsening pneumonia. Patient currently refusing further work-up and thoracentesis.

## 2021-06-23 NOTE — Assessment & Plan Note (Addendum)
Uses 3 L of oxygen at home. On presentation 89% on 4 L Was on 5 to 6 L at peak. Now back to 3 LPM Likely in the setting of CHF exacerbation from noncompliance with medication as well as COPD exacerbation in the setting of pneumonia. Continue incentive spirometry and flutter valve.
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
## 2021-06-23 NOTE — ED Notes (Signed)
Echo complete at this time

## 2021-06-23 NOTE — Progress Notes (Signed)
Echocardiogram 2D Echocardiogram has been performed.  Warren Lacy Phuoc Huy RDCS 06/23/2021, 11:47 AM

## 2021-06-23 NOTE — Assessment & Plan Note (Addendum)
-   chronic, continue aspirin  and statin and beta blocker  On Plavix

## 2021-06-23 NOTE — Assessment & Plan Note (Signed)
-  chronic avoid nephrotoxic medications such as NSAIDs, Vanco Zosyn combo,  avoid hypotension, continue to follow renal function  

## 2021-06-23 NOTE — Assessment & Plan Note (Signed)
Serum creatinine at baseline.  Monitor.

## 2021-06-23 NOTE — Assessment & Plan Note (Signed)
Cont Coreg °

## 2021-06-23 NOTE — Assessment & Plan Note (Signed)
Prior history. Intermittently compliant with medication. Continue aspirin statin and beta-blocker as well as Plavix.

## 2021-06-23 NOTE — Assessment & Plan Note (Signed)
Continue statin. 

## 2021-06-23 NOTE — Assessment & Plan Note (Signed)
-  -   Will initiate: Steroid taper  -  Antibiotics   - Albuterol  PRN, - scheduled duoneb,  -  Breo or Dulera at discharge   -  Mucinex.  Titrate O2 to saturation >90%. Follow patients respiratory status.   nfluenza PCR neg   VBG no severe hypercabia    Currently mentating well no evidence of symptomatic hypercarbia

## 2021-06-23 NOTE — Assessment & Plan Note (Signed)
Continue lipitor  ?

## 2021-06-23 NOTE — Assessment & Plan Note (Signed)
Prior history.  Supposed to follow-up with vascular regularly.

## 2021-06-23 NOTE — Assessment & Plan Note (Addendum)
-   Pt diagnosed with CHF based on presence of the following:   rales on exam,   Pulmonary edema on CXR DOE  pleural effusion  With noted response to IV diuretic in ER  admit on telemetry,  cycle cardiac enzymes, Troponin    obtain serial ECG  to evaluate for ischemia as a cause of heart failure  monitor daily weight: There were no vitals filed for this visit. Last BNP BNP (last 3 results) Recent Labs    11/15/20 1150 04/17/21 2100 06/22/21 1801  BNP 3,315.3* 1,851.3* 3,653.6*     diurese with IV lasix and monitor orthostatics and creatinine to avoid over diuresis.  Order echogram to evaluate EF and valves

## 2021-06-23 NOTE — Progress Notes (Signed)
Progress Note   Patient: Ray Smith HKG:[id number] DOB: 08/18/1945 DOA: 06/22/2021     1 DOS: the patient was seen and examined on 06/23/2021   Brief hospital course: No notes on file  Assessment and Plan * Acute on chronic respiratory failure with hypoxia ([place])- (present on admission) Uses 5 L of oxygen at home. On presentation 89%. Currently on 5 to 6 L. Likely in the setting of CHF exacerbation from noncompliance with medication as well as COPD exacerbation in the setting of pneumonia. Will recheck home oxygen evaluation prior to discharge. Continue incentive spirometry and flutter valve.  Acute on chronic systolic CHF (congestive heart failure) ([place])- (present on admission) EF 20 to 25% at baseline.  Echocardiogram shows no significant change in diet. Also has moderate aortic stenosis likely low volume low flow. Patient tells me that he is compliant with his heart medication. BNP significantly elevated. Bilateral pleural effusion present but now loculated. Will continue Lasix 40 mg twice daily.  Loculated pleural effusion- (present on admission) CT scan shows evidence of loculated pleural effusion more on the right. Patient did have pleural effusion in the past but they were not located. This requires thoracentesis for further evaluation especially in the setting of worsening pneumonia. Patient currently refusing further work-up and thoracentesis. Will monitor.  CAP (community acquired pneumonia)- (present on admission) Chest x-ray and CT scan shows evidence of multifocal pneumonia. Check procalcitonin, strep antigen, urine Legionella antigen, blood cultures, continue IV antibiotics. Monitor.  COPD with acute exacerbation ([place])- (present on admission) Mild in the setting of noncompliance with medication.  Patient tells me that he ran out of his lung medication 2 weeks ago. For now continue nebulizer therapy and steroids.  PVD (peripheral vascular disease) ([place])-  (present on admission) Prior history.  Supposed to follow-up with vascular regularly.  Hypertension- (present on admission) Blood pressure relatively stable.  Monitor.  Coronary Artery Disease- (present on admission) Prior history. Intermittently compliant with medication. Continue aspirin statin and beta-blocker as well as Plavix.   Prolonged QT interval- (present on admission) EKG calculated QTC 525.  Corrected for RBBB Bazett QTc value 481 ms. Fridericia: 449 ms, Hodges: 439 ms, Framingham: 440 ms.  No significantly prolonged. Will cautiously use doxycycline  Dyslipidemia- (present on admission) Continue statin.  CKD (chronic kidney disease) stage 2, GFR 60-89 ml/min- (present on admission) Serum creatinine at baseline.  Monitor.  Protein-calorie malnutrition, severe- (present on admission) Continue nutritional supplementation.  Noncompliance by declining intervention or support- (present on admission) Patient has history of noncompliance with medical regimen. Presents with complaints of not taking his lung medication for last 2 weeks. Refusing intervention intermittently as well. Was able per psychiatry in the past and deemed to have capacity. Monitor. This places the patient at high risk of poor outcome.     Subjective: No nausea no vomiting no fever no chills.  Continues to have shortness of breath.  Continues to have fatigue and tiredness.  Not providing any other review of system and keeps on telling me that he is hungry and he needs to eat his breakfast.  Objective Tachypneic, mildly tachycardic. General: Appear in mild distress, no Rash; Oral Mucosa clear. no Abnormal Neck Mass Or lumps, Conjunctiva normal  Cardiovascular: S1 and S2 Present, no Murmur Respiratory: increased respiratory effort, Bilateral Air entry present and no Crackles, bilateral expiratory wheezes Abdomen: Bowel Sound present, Soft and no tenderness Extremities: trace Pedal edema Neurology:  alert and oriented to time, place, and person affect appropriate. no new focal  deficit Gait not checked due to patient safety concerns     Data Reviewed: BNP elevation, stable troponin, mild elevation of CRP.  Normal ESR.  Procalcitonin currently pending.  Family Communication: None at bedside.  Disposition: Status is: Inpatient  Remains inpatient appropriate because: Treatment for combination of heart failure and pneumonia, need to monitor improvement in loculated pleural effusion         Time spent: 35 minutes  Author: Berle Mull 06/23/2021 7:16 PM  For on call review [email or web address].

## 2021-06-23 NOTE — Assessment & Plan Note (Signed)
- -  Patient presenting with  productive cough, fever hypoxia , and infiltrate  on chest x-ray -Infiltrate on CXR and 2-3 characte     will admit for treatment of CAP will start on appropriate antibiotic coverage. - started on cefepime/ vanc given COPD and recent admit   Obtain:  sputum cultures,                 Obtain respiratory panel                  influenza serologies negative                  COVID PCR negative                    blood cultures and sputum cultures ordered                   strep pneumo UA antigen,                   check for Legionella antigen.                Provide oxygen as needed.

## 2021-06-23 NOTE — Assessment & Plan Note (Addendum)
Chest x-ray and CT scan shows evidence of multifocal pneumonia. Negative procalcitonin, strep antigen, urine Legionella antigen, blood cultures, discontinue IV antibiotics. Short course of oral Antibiotics

## 2021-06-23 NOTE — Assessment & Plan Note (Signed)
Order nutritional consult 

## 2021-06-23 NOTE — Assessment & Plan Note (Signed)
Mild in the setting of noncompliance with medication.  Patient tells me that he ran out of his lung medication 2 weeks ago. For now continue nebulizer therapy and steroids.

## 2021-06-23 NOTE — ED Notes (Signed)
Pt sitting on side of bed eating dinner. Pt refused to take lasix states he has been peeing all day and doesn't want to pee all night. Pt denies any pain or any other needs at this time. Call bell in reach

## 2021-06-23 NOTE — Assessment & Plan Note (Addendum)
Underweight. Continue nutritional supplementation. Body mass index is 15.74 kg/m.  Nutrition Problem: Increased nutrient needs Etiology: acute illness, chronic illness Nutrition Interventions: Interventions: Ensure Enlive (each supplement provides 350kcal and 20 grams of protein)

## 2021-06-23 NOTE — Assessment & Plan Note (Signed)
this patient has acute respiratory failure with Hypoxia as   Noted to be hypoxic down to 89% on 4L baseline Likely due to  Pneumonia, CHF exacerbation, COPD exacerbation,   Provide O2 therapy and titrate as needed  Continuous pulse ox   check Pulse ox with ambulation prior to discharge    flutter valve ordered

## 2021-06-23 NOTE — Assessment & Plan Note (Signed)
Patient has history of noncompliance with medical regimen. Presents with complaints of not taking his lung medication for last 2 weeks. Refusing intervention intermittently as well. Was able per psychiatry in the past and deemed to have capacity. Monitor. This places the patient at high risk of poor outcome.

## 2021-06-23 NOTE — Assessment & Plan Note (Addendum)
EKG calculated QTC 525.  Corrected for RBBB Bazett QTc value 481 ms. Fridericia: 449 ms, Hodges: 439 ms, Framingham: 440 ms.  No significantly prolonged. Will cautiously use doxycycline

## 2021-06-23 NOTE — ED Notes (Signed)
Pt refusing blood work and med [employer]

## 2021-06-23 NOTE — Assessment & Plan Note (Addendum)
EF 20 to 25% at baseline.  Echocardiogram shows no significant change in diet. Also has moderate aortic stenosis likely low volume low flow. Patient tells me that he is compliant with his heart medication but then tells me that he has not taken any water pill since he is out of it. BNP significantly elevated. Bilateral pleural effusion present but now loculated. Will continue Lasix 40 mg twice daily.

## 2021-06-23 NOTE — ED Notes (Signed)
ECHO at bedside.

## 2021-06-23 NOTE — ED Notes (Signed)
Pt repeatedly requesting to remove IV in R AC. Pt states "Take it out right now, or I will do it on the way up there, there are not any trained nurses up here. RN and EMT-P advises pt that they will start antibiotics upstairs. Pt still wanting staff to remove IV. Pt states they know what they are doing upstairs."

## 2021-06-24 ENCOUNTER — Inpatient Hospital Stay (HOSPITAL_COMMUNITY): Payer: 59

## 2021-06-24 LAB — LEGIONELLA PNEUMOPHILA SEROGP 1 UR AG: L. pneumophila Serogp 1 Ur Ag: NEGATIVE

## 2021-06-24 LAB — PROCALCITONIN: Procalcitonin: <0.1 ng/mL

## 2021-06-24 MED ORDER — ENSURE ENLIVE PO LIQD
237.0000 mL | Freq: Two times a day (BID) | ORAL | Status: DC
  Administered 2021-06-24: 237 mL via ORAL

## 2021-06-24 MED ORDER — IPRATROPIUM-ALBUTEROL 0.5-2.5 (3) MG/3ML IN SOLN
3.0000 mL | Freq: Three times a day (TID) | RESPIRATORY_TRACT | Status: DC
  Administered 2021-06-24 – 2021-06-25 (×4): 3 mL via RESPIRATORY_TRACT
  Filled 2021-06-24 (×4): qty 3
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
## 2021-06-24 NOTE — Progress Notes (Signed)
Initial Nutrition Assessment  DOCUMENTATION CODES:   Underweight  INTERVENTION:  - will order Ensure Enlive BID, each supplement provides 350 kcal and 20 grams of protein.   NUTRITION DIAGNOSIS:   Increased nutrient needs related to acute illness, chronic illness as evidenced by estimated needs.  GOAL:   Provide needs based on ASPEN/SCCM guidelines  MONITOR:   PO intake, Supplement acceptance, Labs, Weight trends  REASON FOR ASSESSMENT:   Malnutrition Screening Tool, Consult Assessment of nutrition requirement/status  ASSESSMENT:   75 y.o. male with medical history of COPD on 5L O2, CAD, CHF, HTN, HLD, tobacco abuse, marijuana abuse, and MI. He presented to the ED due to cough, wheezing, and shortness of breath. He reported to ED staff that he no longer smokes.  Patient noted to be refusing care and verbally abusive toward staff this shift. Did not attempt to see patient in person and all information gathered from the chart.  He ate 100% of breakfast and lunch today (total of 1220 kcal and 45 grams protein).  He has not been seen by a [place] RD since 12/2017.  Weight today is 110 lb and weight has been stable since 11/03/20. No information documented in the edema section of flow sheet this hospitalization.   Per notes: - acute on chronic respiratory failure with hypoxia - acute on chronic CHF - loculated pleural effusion--refusing further work-up and thoracentesis - CAP - hx of COPD with current exacerbation - severe protein calorie malnutrition - non-compliance by declining intervention or support--reported not taking pulmonary medication x2 weeks   Labs reviewed; BUN: 27 mg/dl, Ca: 8.6 mg/dl.  Medications reviewed; 40 mg  lasix BID, 40 mg oral protonix/day.    NUTRITION - FOCUSED PHYSICAL EXAM:  Unable to complete at this time.   Diet Order:   Diet Order             Diet Heart Room service appropriate? Yes; Fluid consistency: Thin  Diet effective now                    EDUCATION NEEDS:   No education needs have been identified at this time  Skin:  Skin Assessment: Reviewed RN Assessment  Last BM:  PTA/unknown  Height:   Ht Readings from Last 1 Encounters:  06/24/21 5\' 10"  (1.778 m)    Weight:   Wt Readings from Last 1 Encounters:  06/24/21 49.8 kg     Estimated Nutritional Needs:  Kcal:  1700-1900 kcal Protein:  90-110 grams Fluid:  >/= 2 L/day       06/26/21, MS, RD, LDN, CNSC Inpatient Clinical Dietitian RD pager # available in AMION  After hours/weekend pager # available in [facility]

## 2021-06-24 NOTE — Progress Notes (Signed)
PT Cancellation Note / Screen  Patient Details Name: Ray Smith MRN: [id number] DOB: December 04, 1945   Cancelled Treatment:    Reason Eval/Treat Not Completed: PT screened, no needs identified, will sign off Pt educated on PT in acute setting and pt declines need for PT at this time.  Pt did work with OT this morning and per OT, pt likely close to his baseline.  Since pt declines need for PT, and pt likely at baseline, PT to sign off.   Janan Halter [place] 06/24/2021, 12:01 PM Paulino Door, DPT Acute Rehabilitation Services Pager: [phone] Office: [phone]

## 2021-06-24 NOTE — Progress Notes (Signed)
Progress Note   Patient: Ray Smith [id number] DOB: Aug 08, 1945 DOA: 06/22/2021     2 DOS: the patient was seen and examined on 06/24/2021   Brief hospital course: No notes on file  Assessment and Plan * Acute on chronic respiratory failure with hypoxia ([place])- (present on admission) Uses 3 L of oxygen at home. On presentation 89% on 4 L Currently on 5 to 6 L. Likely in the setting of CHF exacerbation from noncompliance with medication as well as COPD exacerbation in the setting of pneumonia. Will recheck home oxygen evaluation prior to discharge. Continue incentive spirometry and flutter valve.  Acute on chronic systolic CHF (congestive heart failure) ([place])- (present on admission) EF 20 to 25% at baseline.  Echocardiogram shows no significant change in diet. Also has moderate aortic stenosis likely low volume low flow. Patient tells me that he is compliant with his heart medication. BNP significantly elevated. Bilateral pleural effusion present but now loculated. Will continue Lasix 40 mg twice daily.  Loculated pleural effusion- (present on admission) CT scan shows evidence of loculated pleural effusion more on the right. Patient did have pleural effusion in the past but they were not located. This requires thoracentesis for further evaluation especially in the setting of worsening pneumonia. Patient currently refusing further work-up and thoracentesis. Will monitor.  CAP (community acquired pneumonia)- (present on admission) Chest x-ray and CT scan shows evidence of multifocal pneumonia. Negative procalcitonin, strep antigen, urine Legionella antigen, blood cultures, discontinue IV antibiotics. Monitor.  COPD with acute exacerbation ([place])- (present on admission) Mild in the setting of noncompliance with medication.  Patient tells me that he ran out of his lung medication 2 weeks ago. For now continue nebulizer therapy and steroids.  PVD (peripheral vascular disease)  ([place])- (present on admission) Prior history.  Supposed to follow-up with vascular regularly.  Hypertension- (present on admission) Blood pressure relatively stable.  Monitor.  Coronary Artery Disease- (present on admission) Prior history. Intermittently compliant with medication. Continue aspirin statin and beta-blocker as well as Plavix.   Prolonged QT interval- (present on admission) EKG calculated QTC 525.  Corrected for RBBB Bazett QTc value 481 ms. [place]: 449 ms, Hodges: 439 ms, [place]: 440 ms.  No significantly prolonged. Will cautiously use doxycycline  Dyslipidemia- (present on admission) Continue statin.  CKD (chronic kidney disease) stage 2, GFR 60-89 ml/min- (present on admission) Serum creatinine at baseline.  Monitor.  Protein-calorie malnutrition, severe- (present on admission) Continue nutritional supplementation.  Noncompliance by declining intervention or support- (present on admission) Patient has history of noncompliance with medical regimen. Presents with complaints of not taking his lung medication for last 2 weeks. Refusing intervention intermittently as well. Was able per psychiatry in the past and deemed to have capacity. Monitor. This places the patient at high risk of poor outcome.     Subjective: No nausea no vomiting no fever no chills.  No fever no chills.  Refusing Lasix in the night.  Objective Vital signs were reviewed and unremarkable. General: Appear in mild distress, no Rash; Oral Mucosa dry. no Abnormal Neck Mass Or lumps, Conjunctiva normal  Cardiovascular: S1 and S2 Present, no Murmur Respiratory: increased respiratory effort, Bilateral Air entry present and no Crackles, bilateral expiratory wheezes Abdomen: Bowel Sound present, Soft and no tenderness Extremities: trace Pedal edema Neurology: alert and oriented to time, place, and person affect appropriate. no new focal deficit Gait not checked due to patient safety  concerns    Data Reviewed: My review of labs, imaging, notes and  other tests shows no new significant findings.   Family Communication: None at bedside.  Disposition: Status is: Inpatient  Remains inpatient appropriate because: Continue IV diuresis.  Transition to p.o. tomorrow.         Time spent: 30 minutes  Author: Lynden Oxford 06/24/2021 9:21 PM  For on call review [email or web address].

## 2021-06-24 NOTE — Progress Notes (Signed)
When attempting to complete a head to toe assessment on the pt this shift, the pt became irate and verbally abusive and refused for this RN to perform any hands on care. The pt states "you won't do a damn thing for me." The importance of a daily assessment was impressed upon him but he continues to refuse. Attending MD and charge nurse made aware. Pt is stable and comfortably resting at this time. Will continue to monitor.

## 2021-06-25 MED ORDER — CARVEDILOL 6.25 MG PO TABS: 3.1250 mg | ORAL_TABLET | Freq: Two times a day (BID) | ORAL | 1 refills | Status: DC

## 2021-06-25 MED ORDER — FARXIGA 10 MG PO TABS: 10.0000 mg | ORAL_TABLET | Freq: Every day | ORAL | 0 refills | Status: AC

## 2021-06-25 MED ORDER — ALBUTEROL SULFATE (2.5 MG/3ML) 0.083% IN NEBU: 2.5000 mg | INHALATION_SOLUTION | Freq: Four times a day (QID) | RESPIRATORY_TRACT | 0 refills | Status: DC | PRN

## 2021-06-25 MED ORDER — DOXYCYCLINE HYCLATE 100 MG PO TABS: 100.0000 mg | ORAL_TABLET | Freq: Two times a day (BID) | ORAL | 0 refills | Status: DC

## 2021-06-25 MED ORDER — FLUTICASONE-SALMETEROL 250-50 MCG/ACT IN AEPB
1.0000 | INHALATION_SPRAY | Freq: Two times a day (BID) | RESPIRATORY_TRACT | 2 refills | Status: DC
Start: 2021-06-25 — End: 2021-06-25

## 2021-06-25 MED ORDER — ALBUTEROL SULFATE HFA 108 (90 BASE) MCG/ACT IN AERS: 2.0000 | INHALATION_SPRAY | Freq: Four times a day (QID) | RESPIRATORY_TRACT | 0 refills | Status: DC | PRN

## 2021-06-25 MED ORDER — ASPIRIN 81 MG PO TBEC: 81.0000 mg | DELAYED_RELEASE_TABLET | Freq: Every day | ORAL | 2 refills | Status: AC

## 2021-06-25 MED ORDER — ASPIRIN 81 MG PO TBEC
81.0000 mg | DELAYED_RELEASE_TABLET | Freq: Every day | ORAL | 2 refills | Status: DC
Start: 2021-06-25 — End: 2021-06-25

## 2021-06-25 MED ORDER — ATORVASTATIN CALCIUM 80 MG PO TABS: 80.0000 mg | ORAL_TABLET | Freq: Every day | ORAL | 1 refills | Status: AC

## 2021-06-25 MED ORDER — CLOPIDOGREL BISULFATE 75 MG PO TABS: 75.0000 mg | ORAL_TABLET | Freq: Every day | ORAL | 2 refills | Status: DC

## 2021-06-25 MED ORDER — CARVEDILOL 6.25 MG PO TABS: 3.1250 mg | ORAL_TABLET | Freq: Two times a day (BID) | ORAL | 1 refills | Status: AC

## 2021-06-25 MED ORDER — FUROSEMIDE 40 MG PO TABS: 40.0000 mg | ORAL_TABLET | Freq: Every day | ORAL | 1 refills | Status: DC

## 2021-06-25 MED ORDER — ATORVASTATIN CALCIUM 80 MG PO TABS: 80.0000 mg | ORAL_TABLET | Freq: Every day | ORAL | 1 refills | Status: DC

## 2021-06-25 MED ORDER — ENSURE ENLIVE PO LIQD: 237.0000 mL | Freq: Two times a day (BID) | ORAL | 0 refills | Status: DC

## 2021-06-25 MED ORDER — GUAIFENESIN ER 600 MG PO TB12: 600.0000 mg | ORAL_TABLET | Freq: Two times a day (BID) | ORAL | 0 refills | Status: AC

## 2021-06-25 MED ORDER — FLUTICASONE-SALMETEROL 250-50 MCG/ACT IN AEPB: 1.0000 | INHALATION_SPRAY | Freq: Two times a day (BID) | RESPIRATORY_TRACT | 2 refills | Status: AC

## 2021-06-25 MED ORDER — ENSURE ENLIVE PO LIQD: 237.0000 mL | Freq: Two times a day (BID) | ORAL | 0 refills | Status: AC

## 2021-06-25 MED ORDER — ALBUTEROL SULFATE HFA 108 (90 BASE) MCG/ACT IN AERS: 2.0000 | INHALATION_SPRAY | Freq: Four times a day (QID) | RESPIRATORY_TRACT | 0 refills | Status: AC | PRN

## 2021-06-25 MED ORDER — CLOPIDOGREL BISULFATE 75 MG PO TABS: 75.0000 mg | ORAL_TABLET | Freq: Every day | ORAL | 2 refills | Status: AC

## 2021-06-25 MED ORDER — ORAL CARE MOUTH RINSE
15.0000 mL | Freq: Two times a day (BID) | OROMUCOSAL | Status: DC
  Administered 2021-06-25: 15 mL via OROMUCOSAL

## 2021-06-25 MED ORDER — FUROSEMIDE 40 MG PO TABS: 40.0000 mg | ORAL_TABLET | Freq: Every day | ORAL | 1 refills | Status: AC

## 2021-06-25 MED ORDER — FARXIGA 10 MG PO TABS: 10.0000 mg | ORAL_TABLET | Freq: Every day | ORAL | 0 refills | Status: DC

## 2021-06-25 MED ORDER — GUAIFENESIN ER 600 MG PO TB12: 600.0000 mg | ORAL_TABLET | Freq: Two times a day (BID) | ORAL | 0 refills | Status: DC

## 2021-06-25 NOTE — TOC Transition Note (Addendum)
Transition of Care [facility]) - CM/SW Discharge Note   Patient Details  Name: Ray Smith MRN: [id number] Date of Birth: August 13, 1945  [facility] CM/SW Contact:  Darleene Cleaver, LCSW Phone Number: 06/25/2021, 1:29 PM   Clinical Narrative:     CSW received a message that patient needs a ride home and also needs a portable oxygen tank to return home.  Patient is chronic on oxygen, however he does not have any family or friends that can bring a portable tank from home.  Patient is currently active with [employer] for oxygen, CSW spoke to [place], she is able to get a portable tank delivered to room so he can go home via cab.  CSW provided a cab voucher for patient to get home.   Final next level of care: Home/Self Care Barriers to Discharge: Barriers Resolved   Patient Goals and CMS Choice Patient states their goals for this hospitalization and ongoing recovery are:: To return back home. CMS Medicare.gov Compare Post Acute Care list provided to:: Patient Choice offered to / list presented to : Patient  Discharge Placement  Returning back home with oxgen.                     Discharge Plan and Services                DME Arranged: Oxygen DME Agency: [employer] Date DME Agency Contacted: 06/25/21 Time DME Agency Contacted: 1100 Representative spoke with at DME Agency: Leavy Cella            Social Determinants of Health (SDOH) Interventions     Readmission Risk Interventions Readmission Risk Prevention Plan 04/25/2021 04/25/2021 04/20/2021  Transportation Screening - Complete Complete  PCP or Specialist Appt within 3-5 Days - - -  HRI or Home Care Consult - - -  Palliative Care Screening - - -  Medication Review (RN Care Manager) Complete Complete Complete  PCP or Specialist appointment within 3-5 days of discharge Complete Complete Complete  HRI or Home Care Consult Patient refused - Complete  SW Recovery Care/Counseling Consult Patient refused -  Complete  Palliative Care Screening Not Applicable - Not Applicable  Skilled Nursing Facility Patient Refused - Patient Refused  Some recent data might be hidden

## 2021-06-25 NOTE — Progress Notes (Signed)
AVS given to patient and explained at the bedside. Medications and follow up appointments have been explained with pt verbalizing understanding. O2 tank and cab voucher provided for return trip home

## 2021-06-25 NOTE — Progress Notes (Signed)
Pt requesting to be sent home via ambulance and with his home medications.

## 2021-06-27 ENCOUNTER — Encounter (HOSPITAL_COMMUNITY): Payer: Self-pay | Admitting: Emergency Medicine

## 2021-06-27 ENCOUNTER — Emergency Department (HOSPITAL_COMMUNITY): Payer: 59

## 2021-06-27 ENCOUNTER — Emergency Department (HOSPITAL_COMMUNITY)
Admission: EM | Admit: 2021-06-27 | Discharge: 2021-06-28 | Disposition: A | Discharge status: Home / Self Care | Payer: 59 | Attending: Emergency Medicine | Admitting: Emergency Medicine

## 2021-06-27 DIAGNOSIS — Z7901 Long term (current) use of anticoagulants: Secondary | ICD-10-CM | POA: Diagnosis not present

## 2021-06-27 DIAGNOSIS — R0602 Shortness of breath: Secondary | ICD-10-CM

## 2021-06-27 DIAGNOSIS — Z79899 Other long term (current) drug therapy: Secondary | ICD-10-CM | POA: Diagnosis not present

## 2021-06-27 DIAGNOSIS — Z7982 Long term (current) use of aspirin: Secondary | ICD-10-CM | POA: Insufficient documentation

## 2021-06-27 DIAGNOSIS — J449 Chronic obstructive pulmonary disease, unspecified: Secondary | ICD-10-CM

## 2021-06-27 DIAGNOSIS — Z20822 Contact with and (suspected) exposure to covid-19: Secondary | ICD-10-CM | POA: Insufficient documentation

## 2021-06-27 LAB — BASIC METABOLIC PANEL
Anion gap: 9 (ref 5–15)
BUN: 35 mg/dL — ABNORMAL HIGH (ref 8–23)
CO2: 29 mmol/L (ref 22–32)
Calcium: 8.8 mg/dL — ABNORMAL LOW (ref 8.9–10.3)
Chloride: 103 mmol/L (ref 98–111)
Creatinine, Ser: 1.05 mg/dL (ref 0.61–1.24)
GFR, Estimated: >60 mL/min (ref >60)
Glucose, Bld: 101 mg/dL — ABNORMAL HIGH (ref 70–99)
Potassium: 3.7 mmol/L (ref 3.5–5.1)
Sodium: 141 mmol/L (ref 135–145)

## 2021-06-27 LAB — CBC
HCT: 36.9 % — ABNORMAL LOW (ref 39.0–52.0)
Hemoglobin: 11.1 g/dL — ABNORMAL LOW (ref 13.0–17.0)
MCH: 30 pg (ref 26.0–34.0)
MCHC: 30.1 g/dL (ref 30.0–36.0)
MCV: 99.7 fL (ref 80.0–100.0)
Platelets: 253 10*3/uL (ref 150–400)
RBC: 3.7 MIL/uL — ABNORMAL LOW (ref 4.22–5.81)
RDW: 15.5 % (ref 11.5–15.5)
WBC: 11 10*3/uL — ABNORMAL HIGH (ref 4.0–10.5)
nRBC: 0.2 % (ref 0.0–0.2)

## 2021-06-27 LAB — TROPONIN I (HIGH SENSITIVITY)
Troponin I (High Sensitivity): 44 ng/L — ABNORMAL HIGH (ref <18)
Troponin I (High Sensitivity): 33 ng/L — ABNORMAL HIGH (ref <18)

## 2021-06-27 LAB — BRAIN NATRIURETIC PEPTIDE: B Natriuretic Peptide: 2088.7 pg/mL — ABNORMAL HIGH (ref 0.0–100.0)

## 2021-06-27 MED ORDER — ALBUTEROL SULFATE HFA 108 (90 BASE) MCG/ACT IN AERS: 2.0000 | INHALATION_SPRAY | RESPIRATORY_TRACT | Status: DC | PRN

## 2021-06-27 NOTE — ED Provider Notes (Addendum)
Emergency Medicine Provider Triage Evaluation Note  Ray Smith , a 76 y.o. male  was evaluated in triage.  Patient has a history of COPD and congestive heart failure and MI.  He reports he has been having trouble breathing since he was discharged from [place] [facility].  Is out of his COPD medication.  Also endorsing chest pain.  Was discharged from [facility] on 12/31 with pneumonia in the context of a COPD exacerbation.  Review of Systems  Positive: Shortness of breath, chest pain Negative: Syncope  Physical Exam  BP 112/83    Pulse 90    Temp (!) 97.3 F (36.3 C) (Oral)    Resp (!) 24    SpO2 100%  Gen:   Awake, ill-appearing Resp:  Normal effort, poor air movement, patient unable to take a deep breath. MSK:   Moves extremities without difficulty  Other:    Medical Decision Making  Medically screening exam initiated at 3:12 PM.  Appropriate orders placed.  THUAN TIPPETT was informed that the remainder of the evaluation will be completed by another provider, this initial triage assessment does not replace that evaluation, and the importance of remaining in the ED until their evaluation is complete.   Saddie Benders, PA-C 06/27/21 1528    Gloris Manchester, MD 06/29/21 [phone]

## 2021-06-27 NOTE — ED Notes (Signed)
No answer in lobby for labs

## 2021-06-27 NOTE — ED Triage Notes (Signed)
Patient BIB [facility] from home after being discharged from [facility] two days ago after being admitted for PNA and COPD exacerbation. Patient reports persistent chest pain that started before his admission to [facility]. 5L O2 [place] at baseline. Patient alert, oriented, and in no apparent distress at this time.

## 2021-06-28 ENCOUNTER — Telehealth: Payer: Self-pay | Admitting: *Deleted

## 2021-06-28 ENCOUNTER — Other Ambulatory Visit (HOSPITAL_COMMUNITY): Payer: Self-pay

## 2021-06-28 ENCOUNTER — Other Ambulatory Visit: Payer: Self-pay

## 2021-06-28 ENCOUNTER — Encounter (HOSPITAL_COMMUNITY): Payer: Self-pay

## 2021-06-28 LAB — CULTURE, BLOOD (ROUTINE X 2)
Culture: NO GROWTH
Culture: NO GROWTH
Special Requests: ADEQUATE

## 2021-06-28 LAB — RESP PANEL BY RT-PCR (FLU A&B, COVID) ARPGX2
Influenza A by PCR: NEGATIVE
Influenza B by PCR: NEGATIVE
SARS Coronavirus 2 by RT PCR: NEGATIVE

## 2021-06-28 MED ORDER — DOXYCYCLINE HYCLATE 100 MG PO TABS
100.0000 mg | ORAL_TABLET | Freq: Once | ORAL | Status: AC
  Administered 2021-06-28: 100 mg via ORAL
  Filled 2021-06-28: qty 1

## 2021-06-28 MED ORDER — ALBUTEROL SULFATE HFA 108 (90 BASE) MCG/ACT IN AERS
6.0000 | INHALATION_SPRAY | Freq: Once | RESPIRATORY_TRACT | Status: AC
  Administered 2021-06-28: 6 via RESPIRATORY_TRACT
  Filled 2021-06-28: qty 6.7

## 2021-06-28 MED ORDER — ALBUTEROL SULFATE (2.5 MG/3ML) 0.083% IN NEBU
2.5000 mg | INHALATION_SOLUTION | Freq: Four times a day (QID) | RESPIRATORY_TRACT | 0 refills | Status: AC | PRN
  Filled 2021-06-28: qty 90, 8d supply, fill #0

## 2021-06-28 MED ORDER — FUROSEMIDE 10 MG/ML IJ SOLN
40.0000 mg | Freq: Once | INTRAMUSCULAR | Status: AC
  Administered 2021-06-28: 40 mg via INTRAVENOUS
  Filled 2021-06-28: qty 4

## 2021-06-28 MED ORDER — DOXYCYCLINE HYCLATE 100 MG PO TABS
100.0000 mg | ORAL_TABLET | Freq: Two times a day (BID) | ORAL | 0 refills | Status: AC
  Filled 2021-06-28: qty 6, 3d supply, fill #0

## 2021-06-28 NOTE — Discharge Summary (Signed)
Physician Discharge Summary   Patient: Ray Smith MRN: [id number] DOB: @DOB   Admit date:     06/22/2021  Discharge date: 06/25/2021  Discharge Physician: Berle Mull   PCP: Sandrea Hughs, NP   Recommendations at discharge: Please follow up with PCP in 1 week Remain compliant with meds  Discharge Diagnoses Principal Problem:   Acute on chronic respiratory failure with hypoxia [facility]) Active Problems:   Acute on chronic systolic CHF (congestive heart failure) ([place])   COPD with acute exacerbation ([place])   CAP (community acquired pneumonia)   Loculated pleural effusion   Coronary Artery Disease   Hypertension   PVD (peripheral vascular disease) ([place])   Dyslipidemia   Prolonged QT interval   CKD (chronic kidney disease) stage 2, GFR 60-89 ml/min   Protein-calorie malnutrition, severe   Noncompliance by declining intervention or support  Resolved Problems:   * No resolved hospital problems. [facility] Course   No notes on file  * Acute on chronic respiratory failure with hypoxia ([place])- (present on admission) Uses 3 L of oxygen at home. On presentation 89% on 4 L Was on 5 to 6 L at peak. Now back to 3 LPM Likely in the setting of CHF exacerbation from noncompliance with medication as well as COPD exacerbation in the setting of pneumonia. Continue incentive spirometry and flutter valve.  Acute on chronic systolic CHF (congestive heart failure) ([place])- (present on admission) EF 20 to 25% at baseline.  Echocardiogram shows no significant change in diet. Also has moderate aortic stenosis likely low volume low flow. Patient tells me that he is compliant with his heart medication but then tells me that he has not taken any water pill since he is out of it. BNP significantly elevated. Bilateral pleural effusion present but now loculated. Will continue Lasix 40 mg twice daily.  Loculated pleural effusion- (present on admission) CT scan shows evidence of loculated pleural  effusion more on the right. Patient did have pleural effusion in the past but they were not located. This requires thoracentesis for further evaluation especially in the setting of worsening pneumonia. Patient currently refusing further work-up and thoracentesis.  CAP (community acquired pneumonia)- (present on admission) Chest x-ray and CT scan shows evidence of multifocal pneumonia. Negative procalcitonin, strep antigen, urine Legionella antigen, blood cultures, discontinue IV antibiotics. Short course of oral Antibiotics  COPD with acute exacerbation ([place])- (present on admission) Mild in the setting of noncompliance with medication.  Patient tells me that he ran out of his lung medication 2 weeks ago. For now continue nebulizer therapy and steroids.  PVD (peripheral vascular disease) ([place])- (present on admission) Prior history.  Supposed to follow-up with vascular regularly.  Hypertension- (present on admission) Blood pressure relatively stable.  Monitor.  Coronary Artery Disease- (present on admission) Prior history. Intermittently compliant with medication. Continue aspirin statin and beta-blocker as well as Plavix.   Prolonged QT interval- (present on admission) EKG calculated QTC 525.  Corrected for RBBB Bazett QTc value 481 ms. Fridericia: 449 ms, Hodges: 439 ms, Framingham: 440 ms.  No significantly prolonged. Will cautiously use doxycycline  Dyslipidemia- (present on admission) Continue statin.  CKD (chronic kidney disease) stage 2, GFR 60-89 ml/min- (present on admission) Serum creatinine at baseline.  Monitor.  Protein-calorie malnutrition, severe- (present on admission) Underweight. Continue nutritional supplementation. Body mass index is 15.74 kg/m.  Nutrition Problem: Increased nutrient needs Etiology: acute illness, chronic illness Nutrition Interventions: Interventions: Ensure Enlive (each supplement provides 350kcal and 20 grams of  protein)  Noncompliance by declining intervention or support- (present on admission) Patient has history of noncompliance with medical regimen. Presents with complaints of not taking his lung medication for last 2 weeks. Refusing intervention intermittently as well. Was able per psychiatry in the past and deemed to have capacity. Monitor. This places the patient at high risk of poor outcome.      Consultants: none Procedures performed: none  Disposition: Home health Diet recommendation: Cardiac diet  DISCHARGE MEDICATION: Allergies as of 06/25/2021       Reactions   Tramadol Anaphylaxis   Flexeril [cyclobenzaprine] Other (See Comments)   Per patient "it made my heart stop"   Ibuprofen Other (See Comments)   Overuse damages pt's kidneys   Ibuprofen Nausea Only, Other (See Comments)   Reaction not recalled- perhaps made his stomach hurt   Lactose Intolerance (gi) Other (See Comments)   Digestive issues        Medication List     STOP taking these medications    ipratropium 0.03 % nasal spray Commonly known as: ATROVENT   Stiolto Respimat 2.5-2.5 MCG/ACT Aers Generic drug: Tiotropium Bromide-Olodaterol       TAKE these medications    albuterol 108 (90 Base) MCG/ACT inhaler Commonly known as: VENTOLIN HFA Inhale 2 puffs into the lungs every 6 (six) hours as needed for wheezing or shortness of breath. What changed: Another medication with the same name was removed. Continue taking this medication, and follow the directions you see here.   aspirin 81 MG EC tablet Take 1 tablet (81 mg total) by mouth daily.   atorvastatin 80 MG tablet Commonly known as: LIPITOR Take 1 tablet (80 mg total) by mouth daily at 6 PM.   carvedilol 6.25 MG tablet Commonly known as: COREG Take 0.5 tablets (3.125 mg total) by mouth 2 (two) times daily with a meal. What changed: Another medication with the same name was removed. Continue taking this medication, and follow the  directions you see here.   cetirizine 10 MG tablet Commonly known as: ZYRTEC Take 1 tablet (10 mg total) by mouth daily.   clopidogrel 75 MG tablet Commonly known as: PLAVIX Take 1 tablet (75 mg total) by mouth daily.   Farxiga 10 MG Tabs tablet Generic drug: dapagliflozin propanediol Take 1 tablet (10 mg total) by mouth daily.   feeding supplement Liqd Take 237 mLs by mouth 2 (two) times daily between meals.   fluticasone-salmeterol 250-50 MCG/ACT Aepb Commonly known as: ADVAIR Inhale 1 puff into the lungs in the morning and at bedtime.   furosemide 40 MG tablet Commonly known as: LASIX Take 1 tablet (40 mg total) by mouth daily.   guaiFENesin 600 MG 12 hr tablet Commonly known as: MUCINEX Take 1 tablet (600 mg total) by mouth 2 (two) times daily.   mirtazapine 7.5 MG tablet Commonly known as: REMERON Take 1 tablet (7.5 mg total) by mouth at bedtime.   nitroGLYCERIN 0.4 MG SL tablet Commonly known as: NITROSTAT Place 1 tablet (0.4 mg total) under the tongue every 5 (five) minutes x 3 doses as needed for chest pain.   OXYGEN Inhale 5 L/min into the lungs at bedtime as needed (for shortness of breath).   pantoprazole 40 MG tablet Commonly known as: PROTONIX Take 1 tablet (40 mg total) by mouth at bedtime.   polyethylene glycol powder 17 GM/SCOOP powder Commonly known as: GLYCOLAX/MIRALAX Take 17 g by mouth daily.        Follow-up Information  Ngetich, Nelda Bucks, NP. Schedule an appointment as soon as possible for a visit in 1 week(s).   Specialty: Family Medicine Contact information: [place] [address] [phone]                 Discharge Exam: Danley Danker Weights   06/24/21 0528 06/25/21 0500  Weight: 49.8 kg 50.9 kg   General: Appear in mild distress, no Rash; Oral Mucosa Clear, moist. no Abnormal Neck Mass Or lumps, Conjunctiva normal  Cardiovascular: S1 and S2 Present, no Murmur, Respiratory: good respiratory effort,  Bilateral Air entry present and faint basal Crackles, Occasional  wheezes Abdomen: Bowel Sound present, Soft and no tenderness Extremities: no Pedal edema Neurology: alert and oriented to time, place, and person affect appropriate. no new focal deficit Gait not checked due to patient safety concerns   Condition at discharge: good  The results of significant diagnostics from this hospitalization (including imaging, microbiology, ancillary and laboratory) are listed below for reference.   Imaging Studies: DG Chest 2 View  Result Date: 06/27/2021 CLINICAL DATA:  Shortness of breath. EXAM: CHEST - 2 VIEW COMPARISON:  June 24, 2021. FINDINGS: Stable cardiomediastinal silhouette. Bibasilar edema or atelectasis is noted with small bilateral pleural effusions. Bony thorax is unremarkable. IMPRESSION: Bibasilar edema or atelectasis is noted with small bilateral pleural effusions. Electronically Signed   By: Marijo Conception M.D.   On: 06/27/2021 15:53   CT Angio Chest PE W and/or Wo Contrast  Result Date: 06/22/2021 CLINICAL DATA:  Shortness of breath and chest tightness. EXAM: CT ANGIOGRAPHY CHEST WITH CONTRAST TECHNIQUE: Multidetector CT imaging of the chest was performed using the standard protocol during bolus administration of intravenous contrast. Multiplanar CT image reconstructions and MIPs were obtained to evaluate the vascular anatomy. CONTRAST:  81mL OMNIPAQUE IOHEXOL 350 MG/ML SOLN COMPARISON:  April 22, 2021 FINDINGS: Cardiovascular: There is marked severity calcification of the thoracic aorta with stable, approximately 4.0 cm aneurysmal dilatation of a short segment of the ascending aortic arch. A stable 3.4 cm x 2.6 cm saccular aneurysm of the anterolateral aspect of the distal descending thoracic aorta is noted. Satisfactory opacification of the pulmonary arteries to the segmental level. No evidence of pulmonary embolism. There is mild cardiomegaly with marked severity coronary artery  calcification. No pericardial effusion. Mediastinum/Nodes: No enlarged mediastinal, hilar, or axillary lymph nodes. The thyroid gland and trachea demonstrate no significant findings. Fluid and heterogeneous material is again seen throughout the lumen of a mildly distended esophagus. Lungs/Pleura: Moderate to marked severity emphysematous lung disease is seen. There are moderate to marked severity multifocal infiltrates involving the bilateral upper lobes and bilateral lower lobes. This is increased in severity when compared to the prior study. Moderate to large bilateral pleural effusions are seen. These are increased in size when compared to the prior study. Multiple bilateral loculated components are noted. No pneumothorax is identified. Upper Abdomen: Multiple cystic appearing areas of various sizes are seen within the anterior aspect of the right lobe of the liver, with punctate calcified granulomas seen within the liver and spleen. A 2.5 cm x 2.0 cm exophytic left renal cyst is noted. Musculoskeletal: Multilevel degenerative changes seen throughout the lumbar spine Review of the MIP images confirms the above findings. IMPRESSION: 1. No evidence of pulmonary embolus. 2. Stable aneurysmal dilatation of a short segment of the ascending aortic arch and distal aspect of the descending thoracic aorta. 3. Moderate to marked severity bilateral upper lobe and bilateral lower lobe infiltrates, increased in  severity when compared to the prior study. 4. Moderate to large bilateral multiloculated pleural effusions, increased in size when compared to the prior study. 5. Moderate to marked severity emphysematous lung disease. 6. Multiple hepatic and left renal cysts. Aortic Atherosclerosis (ICD10-I70.0) and Emphysema (ICD10-J43.9). Electronically Signed   By: Virgina Norfolk M.D.   On: 06/22/2021 20:09   DG CHEST PORT 1 VIEW  Result Date: 06/24/2021 CLINICAL DATA:  Pleural effusion EXAM: PORTABLE CHEST 1 VIEW  COMPARISON:  06/22/2021 FINDINGS: Persistent moderate right pleural effusion with loculation. Right basilar atelectasis/consolidation with slightly improved aeration. Persistent small left pleural effusion and left basilar atelectasis/consolidation. Chronic emphysematous changes including cystic appearance at the left apex. Similar cardiomediastinal contours. No pneumothorax. IMPRESSION: Persistent right greater than left pleural effusions and bibasilar atelectasis/consolidation. Aeration appears slightly improved at the right base. Electronically Signed   By: Macy Mis M.D.   On: 06/24/2021 09:14   DG Chest Port 1 View  Result Date: 06/22/2021 CLINICAL DATA:  Dyspnea EXAM: PORTABLE CHEST 1 VIEW COMPARISON:  04/25/2021, CT 04/22/2021 FINDINGS: Emphysema. Small left pleural effusion. Moderate loculated appearing right pleural effusion. Enlarged cardiomediastinal silhouette with vascular congestion and diffuse interstitial and ground-glass opacities suspicious for pulmonary edema. Aortic atherosclerosis. Patchy airspace disease at left lung apex. No pneumothorax. IMPRESSION: 1. Cardiomegaly with vascular congestion and diffuse interstitial and ground-glass opacities suspicious for pulmonary edema. Cardiac size enlarged compared to prior, cannot exclude pericardial effusion. 2. Small left pleural effusion with moderate loculated appearing right pleural effusion 3. Basilar consolidations and patchy airspace disease at left lung base could reflect pneumonia Electronically Signed   By: Donavan Foil M.D.   On: 06/22/2021 18:05   ECHOCARDIOGRAM COMPLETE  Result Date: 06/23/2021    ECHOCARDIOGRAM REPORT   Patient Name:   Ray Smith Date of Exam: 06/23/2021 Medical Rec #:  [id number]     Height:       70.0 in Accession #:    [id number]    Weight:       99.2 lb Date of Birth:  [birth year]     BSA:          1.549 m Patient Age:    76 years      BP:           111/100 mmHg Patient Gender: M             HR:            93 bpm. Exam Location:  Inpatient Procedure: 2D Echo, Color Doppler and Cardiac Doppler Indications:    R06.9 DOE  History:        Patient has prior history of Echocardiogram examinations, most                 recent 11/03/2020. CHF, CAD, COPD; Risk Factors:Hypertension and                 Dyslipidemia.  Sonographer:    Raquel Sarna Senior RDCS Referring Phys: [place]  Sonographer Comments: Technically difficult due to COPD IMPRESSIONS  1. Left ventricular ejection fraction, by estimation, is 20 to 25%. The left ventricle has severely decreased function. The left ventricle demonstrates regional wall motion abnormalities (see scoring diagram/findings for description). Left ventricular diastolic parameters are indeterminate.  2. Right ventricular systolic function is normal. The right ventricular size is normal. There is normal pulmonary artery systolic pressure.  3. Left atrial size was severely dilated.  4. Right atrial size was mildly dilated.  5. The mitral  valve is normal in structure. No evidence of mitral valve regurgitation. No evidence of mitral stenosis.  6. Likely moderate aortic stenosis. Dimensionless index 0.38. The aortic valve is calcified. There is mild calcification of the aortic valve. There is mild thickening of the aortic valve. Aortic valve regurgitation is not visualized. Mild aortic valve stenosis. Aortic valve area, by VTI measures 1.32 cm. Aortic valve mean gradient measures 14.0 mmHg. Aortic valve Vmax measures 2.61 m/s.  7. The inferior vena cava is normal in size with greater than 50% respiratory variability, suggesting right atrial pressure of 3 mmHg. FINDINGS  Left Ventricle: Left ventricular ejection fraction, by estimation, is 20 to 25%. The left ventricle has severely decreased function. The left ventricle demonstrates regional wall motion abnormalities. The left ventricular internal cavity size was normal  in size. There is no left ventricular hypertrophy. Left  ventricular diastolic parameters are indeterminate.  LV Wall Scoring: The anterior septum, apical anterior segment, and apex are akinetic. The anterior wall, mid and distal lateral wall, inferior septum, entire inferior wall, and mid anterolateral segment are hypokinetic. The basal inferolateral segment and basal anterolateral segment are normal. Right Ventricle: The right ventricular size is normal. No increase in right ventricular wall thickness. Right ventricular systolic function is normal. There is normal pulmonary artery systolic pressure. The tricuspid regurgitant velocity is 2.50 m/s, and  with an assumed right atrial pressure of 3 mmHg, the estimated right ventricular systolic pressure is [id number] mmHg. Left Atrium: Left atrial size was severely dilated. Right Atrium: Right atrial size was mildly dilated. Pericardium: There is no evidence of pericardial effusion. Mitral Valve: The mitral valve is normal in structure. No evidence of mitral valve regurgitation. No evidence of mitral valve stenosis. Tricuspid Valve: The tricuspid valve is normal in structure. Tricuspid valve regurgitation is trivial. No evidence of tricuspid stenosis. Aortic Valve: Likely moderate aortic stenosis. Dimensionless index 0.38. The aortic valve is calcified. There is mild calcification of the aortic valve. There is mild thickening of the aortic valve. Aortic valve regurgitation is not visualized. Mild aortic stenosis is present. Aortic valve mean gradient measures 14.0 mmHg. Aortic valve peak gradient measures 27.2 mmHg. Aortic valve area, by VTI measures 1.32 cm. Pulmonic Valve: The pulmonic valve was normal in structure. Pulmonic valve regurgitation is not visualized. No evidence of pulmonic stenosis. Aorta: The aortic root is normal in size and structure. Venous: The inferior vena cava is normal in size with greater than 50% respiratory variability, suggesting right atrial pressure of 3 mmHg. IAS/Shunts: No atrial level shunt  detected by color flow Doppler.  LEFT VENTRICLE PLAX 2D LVIDd:         4.90 cm LVIDs:         4.00 cm LV PW:         1.00 cm LV IVS:        0.80 cm LVOT diam:     2.10 cm LV SV:         55 LV SV Index:   36 LVOT Area:     3.46 cm  LV Volumes (MOD) LV vol d, MOD A2C: 209.0 ml LV vol d, MOD A4C: 191.0 ml LV vol s, MOD A2C: 180.0 ml LV vol s, MOD A4C: 143.0 ml LV SV MOD A2C:     29.0 ml LV SV MOD A4C:     191.0 ml LV SV MOD BP:      41.2 ml RIGHT VENTRICLE RV S prime:     11.20 cm/s TAPSE (M-mode):  2.1 cm LEFT ATRIUM              Index        RIGHT ATRIUM           Index LA diam:        2.60 cm  1.68 cm/m   RA Area:     17.00 cm LA Vol (A2C):   103.0 ml 66.47 ml/m  RA Volume:   49.30 ml  31.82 ml/m LA Vol (A4C):   41.4 ml  26.72 ml/m LA Biplane Vol: 64.7 ml  41.76 ml/m  AORTIC VALVE AV Area (Vmax):    1.27 cm AV Area (Vmean):   1.41 cm AV Area (VTI):     1.32 cm AV Vmax:           261.00 cm/s AV Vmean:          165.000 cm/s AV VTI:            0.419 m AV Peak Grad:      27.2 mmHg AV Mean Grad:      14.0 mmHg LVOT Vmax:         95.37 cm/s LVOT Vmean:        67.333 cm/s LVOT VTI:          0.160 m LVOT/AV VTI ratio: 0.38  AORTA Ao Root diam: 3.40 cm TRICUSPID VALVE TR Peak grad:   25.0 mmHg TR Vmax:        250.00 cm/s  SHUNTS Systemic VTI:  0.16 m Systemic Diam: 2.10 cm Skeet Latch MD Electronically signed by Skeet Latch MD Signature Date/Time: 06/23/2021/4:49:29 PM    Final     Microbiology: Results for orders placed or performed during the hospital encounter of 06/22/21  Resp Panel by RT-PCR (Flu A&B, Covid) Nasopharyngeal Swab     Status: None   Collection Time: 06/22/21  5:11 PM   Specimen: Nasopharyngeal Swab; Nasopharyngeal(NP) swabs in vial transport medium  Result Value Ref Range Status   SARS Coronavirus 2 by RT PCR NEGATIVE NEGATIVE Final    Comment: (NOTE) SARS-CoV-2 target nucleic acids are NOT DETECTED.  The SARS-CoV-2 RNA is generally detectable in upper respiratory specimens  during the acute phase of infection. The lowest concentration of SARS-CoV-2 viral copies this assay can detect is 138 copies/mL. A negative result does not preclude SARS-Cov-2 infection and should not be used as the sole basis for treatment or other patient management decisions. A negative result may occur with  improper specimen collection/handling, submission of specimen other than nasopharyngeal swab, presence of viral mutation(s) within the areas targeted by this assay, and inadequate number of viral copies(<138 copies/mL). A negative result must be combined with clinical observations, patient history, and epidemiological information. The expected result is Negative.  Fact Sheet for Patients:  EntrepreneurPulse.com.au  Fact Sheet for Healthcare Providers:  IncredibleEmployment.be  This test is no t yet approved or cleared by the [place] FDA and  has been authorized for detection and/or diagnosis of SARS-CoV-2 by FDA under an Emergency Use Authorization (EUA). This EUA will remain  in effect (meaning this test can be used) for the duration of the COVID-19 declaration under Section 564(b)(1) of the Act, 21 U.S.C.section 360bbb-3(b)(1), unless the authorization is terminated  or revoked sooner.       Influenza A by PCR NEGATIVE NEGATIVE Final   Influenza B by PCR NEGATIVE NEGATIVE Final    Comment: (NOTE) The Xpert Xpress SARS-CoV-2/FLU/RSV plus assay is intended as an aid in  the diagnosis of influenza from Nasopharyngeal swab specimens and should not be used as a sole basis for treatment. Nasal washings and aspirates are unacceptable for Xpert Xpress SARS-CoV-2/FLU/RSV testing.  Fact Sheet for Patients: EntrepreneurPulse.com.au  Fact Sheet for Healthcare Providers: IncredibleEmployment.be  This test is not yet approved or cleared by the [place] FDA and has been authorized for detection  and/or diagnosis of SARS-CoV-2 by FDA under an Emergency Use Authorization (EUA). This EUA will remain in effect (meaning this test can be used) for the duration of the COVID-19 declaration under Section 564(b)(1) of the Act, 21 U.S.C. section 360bbb-3(b)(1), unless the authorization is terminated or revoked.  Performed at [facility], [place] [address], [place], [place] [address]   Culture, blood (routine x 2)     Status: None   Collection Time: 06/23/21 11:30 AM   Specimen: Right Antecubital; Blood  Result Value Ref Range Status   Specimen Description   Final    RIGHT ANTECUBITAL Performed at [facility] [address] [place], [place] [address]    Special Requests   Final    BOTTLES DRAWN AEROBIC AND ANAEROBIC Blood Culture adequate volume Performed at [facility] [address] [place], [place] [address]    Culture   Final    NO GROWTH 5 DAYS Performed at [facility], [place] [address] [place], [place] [address]    Report Status 06/28/2021 FINAL  Final  Culture, blood (routine x 2)     Status: None   Collection Time: 06/23/21 11:55 AM   Specimen: BLOOD  Result Value Ref Range Status   Specimen Description   Final    BLOOD LEFT ANTECUBITAL Performed at [facility] [address], [place], [place] [address]    Special Requests   Final    BOTTLES DRAWN AEROBIC AND ANAEROBIC Blood Culture results may not be optimal due to an inadequate volume of blood received in culture bottles Performed at [facility] [address], [place],  [address]    Culture   Final    NO GROWTH 5 DAYS Performed at [facility], [place] [address], [place],  [address]    Report Status 06/28/2021 FINAL  Final    Labs: CBC: Recent Labs  Lab 06/22/21 1801 06/23/21 0508 06/27/21 1530  WBC 10.4 6.9 11.0*  NEUTROABS 8.5* 6.4  --   HGB 11.4* 11.6* 11.1*  HCT 37.7* 37.9*  36.9*  MCV 97.4 98.2 99.7  PLT 271 247 [id number]   Basic Metabolic Panel: Recent Labs  Lab 06/22/21 1801 06/23/21 0508 06/27/21 1530  NA 139 138 141  K 3.9 4.0 3.7  CL 105 105 103  CO2 29 26 29   GLUCOSE 107* 161* 101*  BUN 24* 27* 35*  CREATININE 1.02 0.98 1.05  CALCIUM 8.7* 8.6* 8.8*  MG 2.4 2.4  --   PHOS  --  3.2  --    Liver Function Tests: Recent Labs  Lab 06/22/21 1801 06/23/21 0508  AST 24 20  ALT 12 11  ALKPHOS 147* 124  BILITOT 0.7 0.6  PROT 7.1 6.0*  ALBUMIN 3.3* 2.8*   CBG: No results for input(s): GLUCAP in the last 168 hours.  Discharge time spent: greater than 30 minutes.  Signed:  Berle Mull MD.  [facility]

## 2021-06-28 NOTE — Progress Notes (Signed)
°   06/28/21 0846  TOC ED Mini Assessment  TOC Time spent with patient (minutes): 30  PING Used in TOC Assessment No  Admission or Readmission Diverted Yes  Interventions which prevented an admission or readmission Medication Review  What brought you to the Emergency Department?  "I didn't have my medications"  Barriers to Discharge ED Transportation;ED Medication assistance  Barrier interventions RNCM advises to utilize [facility] for discharge today and change monthly Rx to a pharmacy that delivers.  [facility] of [place] Pharmacologist)

## 2021-06-28 NOTE — ED Notes (Signed)
[facility] called to transport patient  

## 2021-06-28 NOTE — Telephone Encounter (Signed)
Transition Care Management Unsuccessful Follow-up Telephone Call  Date of discharge and from where:  06/25/2021 [place]  Attempts:  1st Attempt  Reason for unsuccessful TCM follow-up call:  Unable to leave message

## 2021-06-28 NOTE — ED Provider Notes (Signed)
[facility] EMERGENCY DEPARTMENT Provider Note   CSN: [id number] Arrival date & time: 06/27/21  1448     History  Chief Complaint  Patient presents with   Shortness of Breath    Ray Smith is a 76 y.o. male.  HPI     This is a 76 year old male with history of CHF, COPD, MI who presents with ongoing shortness of breath.  Patient was discharged from the hospital in the end of December.  He states that he was unable to pick up any medications because "they did not send it to my pharmacy."  He is presently taking antibiotics.  He also states that he does not have an inhaler at home.  He is on home oxygen.  He states that he does not usually require it all the time but has been using it more regularly.  He is normally on 3 to 5 L.  Has not had any persistent fevers.  Denies lower extremity swelling.  When asked if he had chest pain, this patient states "sometimes but not right now."  Chart reviewed.  Patient discharged on Saturday.  He is post be discharged with doxycycline.  Shortness of breath primarily thought to be secondary to CHF and pneumonia.  Home Medications Prior to Admission medications   Medication Sig Start Date End Date Taking? Authorizing Provider  albuterol (PROVENTIL) (2.5 MG/3ML) 0.083% nebulizer solution Use 1 vial (2.5 mg total) by nebulization every 6 (six) hours as needed for wheezing or shortness of breath. 06/25/21   Rolly Salter, MD  albuterol (VENTOLIN HFA) 108 (90 Base) MCG/ACT inhaler Inhale 2 puffs into the lungs every 6 (six) hours as needed for wheezing or shortness of breath. 06/25/21   Rolly Salter, MD  aspirin 81 MG EC tablet Take 1 tablet (81 mg total) by mouth daily. 06/25/21   Rolly Salter, MD  atorvastatin (LIPITOR) 80 MG tablet Take 1 tablet (80 mg total) by mouth daily at 6 PM. 06/25/21 09/23/21  Rolly Salter, MD  carvedilol (COREG) 6.25 MG tablet Take 0.5 tablets (3.125 mg total) by mouth 2 (two) times daily with a  meal. 06/25/21 09/23/21  Rolly Salter, MD  cetirizine (ZYRTEC) 10 MG tablet Take 1 tablet (10 mg total) by mouth daily. 04/25/21   Ghimire, Werner Lean, MD  clopidogrel (PLAVIX) 75 MG tablet Take 1 tablet (75 mg total) by mouth daily. 06/25/21 09/23/21  Rolly Salter, MD  doxycycline (VIBRA-TABS) 100 MG tablet Take 1 tablet (100 mg total) by mouth 2 (two) times daily for 3 days. 06/25/21 06/28/21  Rolly Salter, MD  FARXIGA 10 MG TABS tablet Take 1 tablet (10 mg total) by mouth daily. 06/25/21   Rolly Salter, MD  feeding supplement (ENSURE ENLIVE / ENSURE PLUS) LIQD Take 237 mLs by mouth 2 (two) times daily between meals. 06/25/21   Rolly Salter, MD  fluticasone-salmeterol (ADVAIR) 250-50 MCG/ACT AEPB Inhale 1 puff into the lungs in the morning and at bedtime. 06/25/21 07/25/21  Rolly Salter, MD  furosemide (LASIX) 40 MG tablet Take 1 tablet (40 mg total) by mouth daily. 06/25/21 07/25/21  Rolly Salter, MD  guaiFENesin (MUCINEX) 600 MG 12 hr tablet Take 1 tablet (600 mg total) by mouth 2 (two) times daily. 06/25/21   Rolly Salter, MD  mirtazapine (REMERON) 7.5 MG tablet Take 1 tablet (7.5 mg total) by mouth at bedtime. 04/25/21 05/25/21  Ghimire, Werner Lean, MD  nitroGLYCERIN (NITROSTAT) 0.4  MG SL tablet Place 1 tablet (0.4 mg total) under the tongue every 5 (five) minutes x 3 doses as needed for chest pain. 04/25/21   Ghimire, Werner Lean, MD  OXYGEN Inhale 5 L/min into the lungs at bedtime as needed (for shortness of breath).    [provider]  pantoprazole (PROTONIX) 40 MG tablet Take 1 tablet (40 mg total) by mouth at bedtime. 04/25/21   Ghimire, Werner Lean, MD  polyethylene glycol powder (GLYCOLAX/MIRALAX) 17 GM/SCOOP powder Take 17 g by mouth daily. 04/25/21   Ghimire, Werner Lean, MD      Allergies    Tramadol, Flexeril [cyclobenzaprine], Ibuprofen, Ibuprofen, and Lactose intolerance (gi)    Review of Systems   Review of Systems  Constitutional:  Negative for fever.   Respiratory:  Positive for shortness of breath. Negative for cough.   Cardiovascular:  Negative for chest pain.  Gastrointestinal:  Negative for abdominal pain, nausea and vomiting.  Genitourinary:  Negative for dysuria.  All other systems reviewed and are negative.  Physical Exam Updated Vital Signs BP (!) 140/95    Pulse 93    Temp (!) 97.3 F (36.3 C) (Oral)    Resp (!) 26    SpO2 98%  Physical Exam Vitals and nursing note reviewed.  Constitutional:      Comments: Chronically ill-appearing, nontoxic  HENT:     Head: Normocephalic and atraumatic.     Mouth/Throat:     Pharynx: Oropharynx is clear.  Eyes:     Pupils: Pupils are equal, round, and reactive to light.  Cardiovascular:     Rate and Rhythm: Normal rate and regular rhythm.     Heart sounds: Normal heart sounds. No murmur heard. Pulmonary:     Effort: Pulmonary effort is normal. No respiratory distress.     Breath sounds: Wheezing present.     Comments: Generally poor air movement, mildly tachypneic, wheezing in all lung field Abdominal:     General: Bowel sounds are normal.     Palpations: Abdomen is soft.     Tenderness: There is no abdominal tenderness. There is no rebound.  Musculoskeletal:     Cervical back: Neck supple.     Right lower leg: No edema.     Left lower leg: No edema.  Lymphadenopathy:     Cervical: No cervical adenopathy.  Skin:    General: Skin is warm and dry.  Neurological:     Mental Status: He is alert and oriented to person, place, and time.  Psychiatric:        Mood and Affect: Mood normal.    ED Results / Procedures / Treatments   Labs (all labs ordered are listed, but only abnormal results are displayed) Labs Reviewed  BASIC METABOLIC PANEL - Abnormal; Notable for the following components:      Result Value   Glucose, Bld 101 (*)    BUN 35 (*)    Calcium 8.8 (*)    All other components within normal limits  CBC - Abnormal; Notable for the following components:   WBC 11.0  (*)    RBC 3.70 (*)    Hemoglobin 11.1 (*)    HCT 36.9 (*)    All other components within normal limits  BRAIN NATRIURETIC PEPTIDE - Abnormal; Notable for the following components:   B Natriuretic Peptide 2,088.7 (*)    All other components within normal limits  TROPONIN I (HIGH SENSITIVITY) - Abnormal; Notable for the following components:   Troponin I (High  Sensitivity) 44 (*)    All other components within normal limits  TROPONIN I (HIGH SENSITIVITY) - Abnormal; Notable for the following components:   Troponin I (High Sensitivity) 33 (*)    All other components within normal limits  RESP PANEL BY RT-PCR (FLU A&B, COVID) ARPGX2    EKG EKG Interpretation  Date/Time:  Monday June 27 2021 15:03:04 EST Ventricular Rate:  90 PR Interval:  220 QRS Duration: 150 QT Interval:  458 QTC Calculation: 560 R Axis:   259 Text Interpretation: Sinus rhythm with 1st degree A-V block with Premature supraventricular complexes Possible Left atrial enlargement Right bundle branch block Anteroseptal infarct , age undetermined Abnormal ECG When compared with ECG of 23-Jun-2021 03:12, PREVIOUS ECG IS PRESENT No significant change since last tracing Confirmed by Ross Marcus ([id number]) on 06/28/2021 3:51:58 AM  Radiology DG Chest 2 View  Result Date: 06/27/2021 CLINICAL DATA:  Shortness of breath. EXAM: CHEST - 2 VIEW COMPARISON:  June 24, 2021. FINDINGS: Stable cardiomediastinal silhouette. Bibasilar edema or atelectasis is noted with small bilateral pleural effusions. Bony thorax is unremarkable. IMPRESSION: Bibasilar edema or atelectasis is noted with small bilateral pleural effusions. Electronically Signed   By: Lupita Raider M.D.   On: 06/27/2021 15:53    Procedures Procedures    Medications Ordered in ED Medications  albuterol (VENTOLIN HFA) 108 (90 Base) MCG/ACT inhaler 2 puff (has no administration in time range)  doxycycline (VIBRA-TABS) tablet 100 mg (100 mg Oral Given 06/28/21 0446)   albuterol (VENTOLIN HFA) 108 (90 Base) MCG/ACT inhaler 6 puff (6 puffs Inhalation Given 06/28/21 0447)  furosemide (LASIX) injection 40 mg (40 mg Intravenous Given 06/28/21 6384)    ED Course/ Medical Decision Making/ A&P Clinical Course as of 06/28/21 0649  Tue Jun 28, 2021  0649 Patient states he is unable to get his medications and has no way of getting medications as an outpatient.  He is requesting medications here.  He does have oxygen at home. [CH]  [phone] Transition of care team consulted. [CH]    Clinical Course User Index [CH] Monay Houlton, Mayer Masker, MD                           Medical Decision Making  This patient presents to the ED for concern of SOB, this involves an extensive number of treatment options, and is a complaint that carries with it a high risk of complications and morbidity.  The differential diagnosis includes CHF, COPD, infection  Patient chronically ill-appearing but nontoxic.  No overt respiratory distress.  Likely multifactorial shortness of breath.  Has not taken any medication since discharge.  Has significant social determinants hinder his ability to get medications at home.  Work-up is fairly stable from discharge.  Stably elevated troponins.  BNP decreasing.  Chest x-ray with some persistent edema versus atelectasis.  Patient was given a dose of doxycycline HFA.  He is requesting food.  He is adamant that he does not have a way to get his medications at home.  Oxygen requirement is stable.  We will consult social work.  Co morbidities that complicate the patient evaluation COPD, CHF   Additional history obtained from chart review. External records from outside source obtained and reviewed including prior admission notes.   Labs: I Ordered, and personally interpreted labs.  The pertinent results include:  stable elevated troponin, BNP 2088 (less than prior)   Imaging Studies ordered: I ordered imaging studies including chest xray  I independently  visualized and interpreted imaging which showed edema vs atelectasis I agree with the radiologist interpretation   Cardiac Monitoring: The patient was maintained on a cardiac monitor.  I personally viewed and interpreted the cardiac monitored which showed an underlying rhythm of: NSR   Medicines  I ordered medication including hfa, doxycycline  for SOB/CAP. I have reviewed the patients home medicines and have made adjustments as needed   Critical Interventions: O2, hfa   Consultations Obtained: I requested consultation with the Social Work,  and discussed lab and imaging findings as well as pertinent plan - they recommend: pending   Problem List / ED Course: SOB, PNA   Reevaluation: After the interventions noted above, I reevaluated the patient and found that they have :improved   Social Determinants of Health: no home support, no transportation   Dispostion: After consideration of the diagnostic results and the patients response to treatment, I feel that the patent would benefit from pending, likely d/c.         Final Clinical Impression(s) / ED Diagnoses Final diagnoses:  None    Rx / DC Orders ED Discharge Orders     None         Saryah Loper, Mayer Masker, MD 06/28/21 [phone]

## 2021-06-29 ENCOUNTER — Other Ambulatory Visit (HOSPITAL_COMMUNITY): Payer: Self-pay

## 2021-06-29 NOTE — Telephone Encounter (Signed)
Transition Care Management Unsuccessful Follow-up Telephone Call  Date of discharge and from where:  06/25/2021 [place]  Attempts:  2nd Attempt  Reason for unsuccessful TCM follow-up call:  Unable to leave message

## 2021-06-30 NOTE — Telephone Encounter (Signed)
Transition Care Management Unsuccessful Follow-up Telephone Call  Date of discharge and from where:  06/25/2021 [place]  Attempts:  3rd Attempt  Reason for unsuccessful TCM follow-up call:  Unable to leave message

## 2021-07-25 DIAGNOSIS — I509 Heart failure, unspecified: Secondary | ICD-10-CM | POA: Diagnosis not present

## 2021-07-30 DIAGNOSIS — I255 Ischemic cardiomyopathy: Secondary | ICD-10-CM | POA: Diagnosis not present

## 2021-07-30 DIAGNOSIS — J189 Pneumonia, unspecified organism: Secondary | ICD-10-CM | POA: Diagnosis not present

## 2021-07-30 DIAGNOSIS — J449 Chronic obstructive pulmonary disease, unspecified: Secondary | ICD-10-CM | POA: Diagnosis not present

## 2021-07-30 DIAGNOSIS — I509 Heart failure, unspecified: Secondary | ICD-10-CM | POA: Diagnosis not present

## 2021-07-30 DIAGNOSIS — I10 Essential (primary) hypertension: Secondary | ICD-10-CM

## 2021-07-31 DIAGNOSIS — J449 Chronic obstructive pulmonary disease, unspecified: Secondary | ICD-10-CM | POA: Diagnosis not present

## 2021-07-31 DIAGNOSIS — J189 Pneumonia, unspecified organism: Secondary | ICD-10-CM | POA: Diagnosis not present

## 2021-07-31 DIAGNOSIS — I509 Heart failure, unspecified: Secondary | ICD-10-CM | POA: Diagnosis not present

## 2021-07-31 DIAGNOSIS — I255 Ischemic cardiomyopathy: Secondary | ICD-10-CM | POA: Diagnosis not present

## 2021-08-01 DIAGNOSIS — J189 Pneumonia, unspecified organism: Secondary | ICD-10-CM | POA: Diagnosis not present

## 2021-08-01 DIAGNOSIS — I509 Heart failure, unspecified: Secondary | ICD-10-CM | POA: Diagnosis not present

## 2021-08-01 DIAGNOSIS — J449 Chronic obstructive pulmonary disease, unspecified: Secondary | ICD-10-CM | POA: Diagnosis not present

## 2021-08-01 DIAGNOSIS — I255 Ischemic cardiomyopathy: Secondary | ICD-10-CM | POA: Diagnosis not present

## 2021-08-01 DIAGNOSIS — I4510 Unspecified right bundle-branch block: Secondary | ICD-10-CM | POA: Diagnosis not present

## 2021-08-02 DIAGNOSIS — I255 Ischemic cardiomyopathy: Secondary | ICD-10-CM | POA: Diagnosis not present

## 2021-08-02 DIAGNOSIS — J449 Chronic obstructive pulmonary disease, unspecified: Secondary | ICD-10-CM | POA: Diagnosis not present

## 2021-08-02 DIAGNOSIS — J189 Pneumonia, unspecified organism: Secondary | ICD-10-CM | POA: Diagnosis not present

## 2021-08-02 DIAGNOSIS — I509 Heart failure, unspecified: Secondary | ICD-10-CM | POA: Diagnosis not present

## 2021-08-03 DIAGNOSIS — I472 Ventricular tachycardia, unspecified: Secondary | ICD-10-CM

## 2021-08-03 DIAGNOSIS — I255 Ischemic cardiomyopathy: Secondary | ICD-10-CM | POA: Diagnosis not present

## 2021-08-03 DIAGNOSIS — J189 Pneumonia, unspecified organism: Secondary | ICD-10-CM | POA: Diagnosis not present

## 2021-08-03 DIAGNOSIS — I509 Heart failure, unspecified: Secondary | ICD-10-CM | POA: Diagnosis not present

## 2021-08-03 DIAGNOSIS — J449 Chronic obstructive pulmonary disease, unspecified: Secondary | ICD-10-CM | POA: Diagnosis not present

## 2021-08-04 DIAGNOSIS — J449 Chronic obstructive pulmonary disease, unspecified: Secondary | ICD-10-CM | POA: Diagnosis not present

## 2021-08-04 DIAGNOSIS — J189 Pneumonia, unspecified organism: Secondary | ICD-10-CM | POA: Diagnosis not present

## 2021-08-04 DIAGNOSIS — I509 Heart failure, unspecified: Secondary | ICD-10-CM | POA: Diagnosis not present

## 2021-08-04 DIAGNOSIS — I255 Ischemic cardiomyopathy: Secondary | ICD-10-CM | POA: Diagnosis not present

## 2021-08-05 DIAGNOSIS — J189 Pneumonia, unspecified organism: Secondary | ICD-10-CM | POA: Diagnosis not present

## 2021-08-05 DIAGNOSIS — J449 Chronic obstructive pulmonary disease, unspecified: Secondary | ICD-10-CM | POA: Diagnosis not present

## 2021-08-05 DIAGNOSIS — I509 Heart failure, unspecified: Secondary | ICD-10-CM | POA: Diagnosis not present

## 2021-08-05 DIAGNOSIS — I255 Ischemic cardiomyopathy: Secondary | ICD-10-CM | POA: Diagnosis not present

## 2021-08-06 DIAGNOSIS — J449 Chronic obstructive pulmonary disease, unspecified: Secondary | ICD-10-CM | POA: Diagnosis not present

## 2021-08-06 DIAGNOSIS — I255 Ischemic cardiomyopathy: Secondary | ICD-10-CM | POA: Diagnosis not present

## 2021-08-06 DIAGNOSIS — I472 Ventricular tachycardia, unspecified: Secondary | ICD-10-CM | POA: Diagnosis not present

## 2021-08-06 DIAGNOSIS — I5022 Chronic systolic (congestive) heart failure: Secondary | ICD-10-CM | POA: Diagnosis not present

## 2021-08-27 ENCOUNTER — Other Ambulatory Visit: Payer: Self-pay

## 2021-08-27 ENCOUNTER — Emergency Department (HOSPITAL_COMMUNITY)
Admission: EM | Admit: 2021-08-27 | Discharge: 2021-08-28 | Disposition: A | Discharge status: Home / Self Care | Payer: 59 | Attending: Emergency Medicine | Admitting: Emergency Medicine

## 2021-08-27 DIAGNOSIS — J449 Chronic obstructive pulmonary disease, unspecified: Secondary | ICD-10-CM | POA: Insufficient documentation

## 2021-08-27 DIAGNOSIS — I2510 Atherosclerotic heart disease of native coronary artery without angina pectoris: Secondary | ICD-10-CM | POA: Insufficient documentation

## 2021-08-27 DIAGNOSIS — R0602 Shortness of breath: Secondary | ICD-10-CM | POA: Diagnosis not present

## 2021-08-27 DIAGNOSIS — Z79899 Other long term (current) drug therapy: Secondary | ICD-10-CM | POA: Insufficient documentation

## 2021-08-27 DIAGNOSIS — Z7982 Long term (current) use of aspirin: Secondary | ICD-10-CM | POA: Diagnosis not present

## 2021-08-27 DIAGNOSIS — Z7902 Long term (current) use of antithrombotics/antiplatelets: Secondary | ICD-10-CM | POA: Insufficient documentation

## 2021-08-27 DIAGNOSIS — I5020 Unspecified systolic (congestive) heart failure: Secondary | ICD-10-CM | POA: Insufficient documentation

## 2021-08-27 DIAGNOSIS — Z20822 Contact with and (suspected) exposure to covid-19: Secondary | ICD-10-CM | POA: Diagnosis not present

## 2021-08-27 DIAGNOSIS — L89151 Pressure ulcer of sacral region, stage 1: Secondary | ICD-10-CM

## 2021-08-27 DIAGNOSIS — T699XXA Effect of reduced temperature, unspecified, initial encounter: Secondary | ICD-10-CM

## 2021-08-27 LAB — CBC WITH DIFFERENTIAL/PLATELET
Abs Immature Granulocytes: 0.04 10*3/uL (ref 0.00–0.07)
Basophils Absolute: 0 10*3/uL (ref 0.0–0.1)
Basophils Relative: 0 %
Eosinophils Absolute: 0 10*3/uL (ref 0.0–0.5)
Eosinophils Relative: 1 %
HCT: 36.3 % — ABNORMAL LOW (ref 39.0–52.0)
Hemoglobin: 11 g/dL — ABNORMAL LOW (ref 13.0–17.0)
Immature Granulocytes: 1 %
Lymphocytes Relative: 6 %
Lymphs Abs: 0.3 10*3/uL — ABNORMAL LOW (ref 0.7–4.0)
MCH: 29 pg (ref 26.0–34.0)
MCHC: 30.3 g/dL (ref 30.0–36.0)
MCV: 95.8 fL (ref 80.0–100.0)
Monocytes Absolute: 0.6 10*3/uL (ref 0.1–1.0)
Monocytes Relative: 12 %
Neutro Abs: 4.4 10*3/uL (ref 1.7–7.7)
Neutrophils Relative %: 80 %
Platelets: 202 10*3/uL (ref 150–400)
RBC: 3.79 MIL/uL — ABNORMAL LOW (ref 4.22–5.81)
RDW: 18.8 % — ABNORMAL HIGH (ref 11.5–15.5)
WBC: 5.4 10*3/uL (ref 4.0–10.5)
nRBC: 0 % (ref 0.0–0.2)

## 2021-08-27 LAB — COMPREHENSIVE METABOLIC PANEL
ALT: 26 U/L (ref 0–44)
AST: 84 U/L — ABNORMAL HIGH (ref 15–41)
Albumin: 3.4 g/dL — ABNORMAL LOW (ref 3.5–5.0)
Alkaline Phosphatase: 111 U/L (ref 38–126)
Anion gap: 9 (ref 5–15)
BUN: 24 mg/dL — ABNORMAL HIGH (ref 8–23)
CO2: 27 mmol/L (ref 22–32)
Calcium: 8.9 mg/dL (ref 8.9–10.3)
Chloride: 105 mmol/L (ref 98–111)
Creatinine, Ser: 1.17 mg/dL (ref 0.61–1.24)
GFR, Estimated: >60 mL/min (ref >60)
Glucose, Bld: 67 mg/dL — ABNORMAL LOW (ref 70–99)
Potassium: 3.9 mmol/L (ref 3.5–5.1)
Sodium: 141 mmol/L (ref 135–145)
Total Bilirubin: 0.7 mg/dL (ref 0.3–1.2)
Total Protein: 6.2 g/dL — ABNORMAL LOW (ref 6.5–8.1)

## 2021-08-27 LAB — RESP PANEL BY RT-PCR (FLU A&B, COVID) ARPGX2
Influenza A by PCR: NEGATIVE
Influenza B by PCR: NEGATIVE
SARS Coronavirus 2 by RT PCR: NEGATIVE
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
## 2021-08-27 NOTE — ED Notes (Addendum)
Pt. Requested breathing treatment, pt. Oxygen level 99% on room air. RN made aware. ?
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
## 2021-08-27 NOTE — Progress Notes (Signed)
PT consult ordered. TOC will continue to follow ?

## 2021-08-27 NOTE — Progress Notes (Signed)
CSW contacted patients relative Leanne Chang [phone] listed under his contacts. Ms. Ronnald Ramp contacted a friend who lives in the same unit as patient and verified that the power is back on. Ms. Ronnald Ramp believes patient lives in [address]. CSW also spoke with Camillia Herter, [phone]  patients friend listed on his chart. Dylan told CSW that patients son used to work for him but no longer due to going to jail and having substance abuse issues. Dylan stated he would not be able to assist with getting patient home.  ?

## 2021-08-27 NOTE — ED Notes (Signed)
Patient resting with eyes closed. Respirations even and unlabored.
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
## 2021-08-27 NOTE — ED Notes (Signed)
PA-C at the bedside.  ?

## 2021-08-27 NOTE — ED Notes (Signed)
Pt attached to cardiac monitor x2. VSS. Pt given dinner tray and warm blankets as requested.  ?

## 2021-08-27 NOTE — Discharge Instructions (Addendum)
Recommend consider placement at a skilled nursing facility. You have declined this plan today and choose to return home. Please follow up with your doctor to discuss care and placement options.  ?

## 2021-08-27 NOTE — ED Notes (Signed)
PTAR here to take pt  

## 2021-08-27 NOTE — ED Notes (Signed)
Pt A&O x4. Attached to cardiac monitor x3. Pt hypertensive. PA-C Army Melia notified. Awaiting further orders.  ?

## 2021-08-27 NOTE — ED Notes (Signed)
PTAR paged for transport 

## 2021-08-27 NOTE — ED Notes (Signed)
Pt given breakfast tray at this time. 

## 2021-08-27 NOTE — ED Notes (Signed)
Patient reported his depend was soiled with feces. Research officer, trade union cleaned patient thoroughly and placed clean depend on patient prior to discharge with ptar ?

## 2021-08-27 NOTE — ED Notes (Signed)
Patient requesting a "breathing treatment and Ensure."  Vital signs checked and stable.  No acute distress noted.  ?
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
## 2021-08-27 NOTE — ED Notes (Signed)
Still awaiting new SW consult. Writer clicked off SW consult ordered on 08/27/21 @ 0654 am.  ?

## 2021-08-27 NOTE — ED Notes (Signed)
PT's shoes removed over the trash can and about 20 roaches crawled out of them. Shoes and socks placed in plastic bag and double bagged and now in the room. Pt turned and rolled and no other roaches seen on pt.  ?

## 2021-08-27 NOTE — Progress Notes (Signed)
APS report made with [facility]. CSW will receive a call back with a screening decision.  ?

## 2021-08-27 NOTE — ED Notes (Signed)
PA-C notified pt's BG is 67, resulted from BMP. Pt given 2, 8oz apple juice's.  ?

## 2021-08-27 NOTE — Progress Notes (Signed)
[facility] screened out APS report. An outreach referral was placed and CSW was told someone from the community will come out to patients home next week and will speak with him to find out what's going on.  ?

## 2021-08-28 MED ORDER — IPRATROPIUM-ALBUTEROL 0.5-2.5 (3) MG/3ML IN SOLN
3.0000 mL | Freq: Once | RESPIRATORY_TRACT | Status: AC
  Administered 2021-08-28: 3 mL via RESPIRATORY_TRACT
  Filled 2021-08-28: qty 3
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
## 2021-08-28 NOTE — ED Notes (Signed)
Nurse spoke with secretary, Bonnita Nasuti, who messaged the SW on call again regarding patient. JRPRN ?
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
## 2021-08-28 NOTE — ED Notes (Signed)
Patient called nurse via call bell. This nurse and the tech went into patients room to talk with patient. Patients adult diaper changed and patient cleaned while in room. JRPRN ?
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
## 2021-08-28 NOTE — ED Notes (Signed)
Offered to change patient.  Patient reports he does not currently need to be changed at this time.  States "I'm fine."   Multiple attempts made throughout night to have patient change and patient refused.   ?

## 2021-08-28 NOTE — ED Notes (Signed)
Patient requesting a breathing treatment "so I can go to sleep.  I have to have one."  Provided patient with additional blanket and offered to change patient.  Patient states that "I am just fine and don't need to be changed."   ?

## 2021-09-24 DEATH — deceased

## 2022-01-31 ENCOUNTER — Other Ambulatory Visit (HOSPITAL_COMMUNITY): Payer: Self-pay
